# Patient Record
Sex: Male | Born: 1950 | Race: White | Hispanic: No | Marital: Married | Smoking: Never smoker
Health system: Southern US, Community
[De-identification: ages and names within clinical notes are randomized; demographics above are authoritative.]

## PROBLEM LIST (undated history)

## (undated) DIAGNOSIS — R059 Cough, unspecified: Secondary | ICD-10-CM

## (undated) DIAGNOSIS — I10 Essential (primary) hypertension: Secondary | ICD-10-CM

## (undated) DIAGNOSIS — C760 Malignant neoplasm of head, face and neck: Secondary | ICD-10-CM

## (undated) DIAGNOSIS — I4891 Unspecified atrial fibrillation: Secondary | ICD-10-CM

## (undated) DIAGNOSIS — R05 Cough: Secondary | ICD-10-CM

## (undated) DIAGNOSIS — C61 Malignant neoplasm of prostate: Secondary | ICD-10-CM

## (undated) DIAGNOSIS — H269 Unspecified cataract: Secondary | ICD-10-CM

## (undated) DIAGNOSIS — Q211 Atrial septal defect: Secondary | ICD-10-CM

## (undated) DIAGNOSIS — C4490 Unspecified malignant neoplasm of skin, unspecified: Secondary | ICD-10-CM

## (undated) DIAGNOSIS — H409 Unspecified glaucoma: Secondary | ICD-10-CM

## (undated) DIAGNOSIS — I2510 Atherosclerotic heart disease of native coronary artery without angina pectoris: Secondary | ICD-10-CM

## (undated) DIAGNOSIS — Q2112 Patent foramen ovale: Secondary | ICD-10-CM

## (undated) HISTORY — PX: OTHER SURGICAL HISTORY: SHX169

## (undated) HISTORY — PX: CATARACT EXTRACTION: SUR2

## (undated) HISTORY — DX: Unspecified glaucoma: H40.9

## (undated) HISTORY — PX: COLONOSCOPY: SHX174

## (undated) HISTORY — DX: Malignant neoplasm of prostate: C61

## (undated) HISTORY — PX: CHOLECYSTECTOMY: SHX55

## (undated) HISTORY — DX: Essential (primary) hypertension: I10

## (undated) HISTORY — DX: Unspecified cataract: H26.9

## (undated) HISTORY — PX: UPPER GASTROINTESTINAL ENDOSCOPY: SHX188

---

## 2002-01-26 ENCOUNTER — Ambulatory Visit (HOSPITAL_COMMUNITY): Admission: RE | Admit: 2002-01-26 | Discharge: 2002-01-26 | Payer: Self-pay | Admitting: Internal Medicine

## 2002-04-04 ENCOUNTER — Encounter: Payer: Self-pay | Admitting: Internal Medicine

## 2002-04-04 ENCOUNTER — Ambulatory Visit (HOSPITAL_COMMUNITY): Admission: RE | Admit: 2002-04-04 | Discharge: 2002-04-04 | Payer: Self-pay | Admitting: Internal Medicine

## 2002-05-19 ENCOUNTER — Encounter: Payer: Self-pay | Admitting: Surgery

## 2002-05-23 ENCOUNTER — Observation Stay (HOSPITAL_COMMUNITY): Admission: RE | Admit: 2002-05-23 | Discharge: 2002-05-24 | Payer: Self-pay | Admitting: Surgery

## 2002-05-23 ENCOUNTER — Encounter: Payer: Self-pay | Admitting: Surgery

## 2002-05-23 ENCOUNTER — Encounter (INDEPENDENT_AMBULATORY_CARE_PROVIDER_SITE_OTHER): Payer: Self-pay | Admitting: Specialist

## 2007-04-08 DIAGNOSIS — C61 Malignant neoplasm of prostate: Secondary | ICD-10-CM

## 2007-04-08 HISTORY — DX: Malignant neoplasm of prostate: C61

## 2007-04-08 HISTORY — PX: PROSTATE SURGERY: SHX751

## 2007-12-20 ENCOUNTER — Inpatient Hospital Stay (HOSPITAL_COMMUNITY): Admission: RE | Admit: 2007-12-20 | Discharge: 2007-12-21 | Payer: Self-pay | Admitting: Urology

## 2007-12-20 ENCOUNTER — Encounter (INDEPENDENT_AMBULATORY_CARE_PROVIDER_SITE_OTHER): Payer: Self-pay | Admitting: Urology

## 2010-02-07 ENCOUNTER — Telehealth: Payer: Self-pay | Admitting: Internal Medicine

## 2010-05-09 NOTE — Progress Notes (Signed)
Summary: triage eval  Phone Note Call from Patient Call back at [phone]   Caller: wife, Clydie Braun Call For: Dr. Juanda Chance Reason for Call: Talk to Nurse Summary of Call: pt had recent dentist visit and a lump was found on his uvula... dentist suggested pt see an ENT for eval... wife and pt would rather pt see Dr. Juanda Chance for this than an ENT... pt would need an NP3 with Dr. Juanda Chance- nothing available at this time... asked to speak with Dottie about this... Clydie Braun afraid of going to an ENT and then being referred to Dr. Juanda Chance anyway Initial call taken by: Vallarie Mare,  February 07, 2010 11:26 AM  Follow-up for Phone Call        I have advised patient's wife, Clydie Braun that the dentist is correct in sending patient to an ENT for a "lump on the uvula" as GI does not typically deal with the uvula. Patient's wife would still like me to confirm this with Dr Juanda Chance which I will do. Follow-up by: Lamona Curl CMA Duncan Dull),  February 07, 2010 11:45 AM  Additional Follow-up for Phone Call Additional follow up Details #1::        I agree with ENT eval. Additional Follow-up by: Hart Carwin MD,  February 07, 2010 12:39 PM     Appended Document: triage eval Left message on voicemail that Dr Juanda Chance does agree that patient needs ENT evaluation prior to any GI work up. Dr Juanda Chance will be glad to see the patient if the ENT feels that the area on the uvula has been caused by GERD etc.

## 2010-08-20 NOTE — Op Note (Signed)
Nicholas Daniels, Nicholas Daniels NO.:  [id number]   MEDICAL RECORD NO.:  [id number]          PATIENT TYPE:  INP   LOCATION:  1429                         FACILITY:  [facility]   PHYSICIAN:  Valetta Fuller, M.D.  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   PROCEDURE PERFORMED:  Robotic assisted laparoscopic radical retropubic  prostatectomy with bilateral pelvic lymph node dissection.   SURGEON:  Valetta Fuller, M.D.   ASSISTANT:  Heloise Purpura, MD   ANESTHESIA:  General endotracheal.   INDICATIONS:  [name] is a 60 year old male.  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 Marcine Matar.  The patient's PSA was minimally elevated at  approximately 5.1.  Digital rectal exam revealed no significant  abnormalities.  The patient's right-sided biopsies were all negative.  On the left he did have positive biopsies at the base, mid and apical  region of the prostate.  He had a mixture of Gleason 6 as well as 7  adenocarcinoma of the prostate.  Biopsies were generally of lower volume  disease.  No additional imaging studies were performed.  The patient  underwent extensive consultation with Dr. Retta Diones and subsequently  myself with regard to treatment options.  We felt that he was a good  candidate for robotic prostatectomy and felt that he would be a  candidate for bilateral nerve spare.  The patient appeared to understand  the advantages and disadvantages of the surgical approach and elected to  proceed with robotic prostatectomy for management of his condition.  The  patient received perioperative Unasyn and had placement of compression  boots.   He presents now for the procedure.   TECHNIQUE AND FINDINGS:  The patient was brought to the operating room  where he had successful induction of general endotracheal anesthesia.  He is placed in mid lithotomy position and prepped and draped in the  usual manner.  All extremities were carefully padded.  He was secured to  the operative table and then placed in steep Trendelenburg position  where he was then prepped and draped in the usual manner.  His Foley  catheter was placed sterilely on the field.  Initial camera port  incision was chosen 18 cm above the pubic symphysis just to the left of  the umbilicus.  A standard open Hasson technique was utilized and no  adhesions were palpated.  The 12 mm trocar was placed without difficulty  and the abdomen insufflated without incident.  All other trocars were  placed with direct visual guidance.  This included three 8-mm robotic  assist ports.  A 12-mm and 5-mm assist port were also placed.  After  placement of all the ports, the robotic cart was docked.  The bladder  was then filled to help identify it and then the space  of Retzius was  dissected with hot electrocautery scissors.  Superficial fat and  superficial dorsal venous complex were taken care of with electrocautery  scissors.  The endopelvic fascia was then incised from base to the apex  of the prostate.  Levator musculature was swept off the apex exposing  the groove for the dorsal venous complex.  The dorsal vein was then  stapled.  Hemostasis was excellent.  Anterior bladder neck was then  identified with the aid of the Foley catheter balloon.  This was  transected until the Foley catheter was found in the midline which was  then retracted anteriorly.  There did not appear to be a middle lobe  component and the posterior bladder neck was then transected.  Indigo  carmine was given and we appeared to be well away from the ureteral  orifices.  The seminal vesicles and vas deferens  were found without  incident and dissected free individually.  Clips were used to the tips  of the seminal vesicles and a minimum of cautery was utilized.  The  posterior plane between the rectum and prostate was then established  with blunt dissecting technique.   Attention was then turned towards bilateral nerve spare.  Superficial  fascia off the prostate was incised bilaterally sweeping off the  neurovascular bundles from their posterolateral position.  This was  carried out the apex of the prostate taking them off the lateral  urethra.  The prostate was then lifted identifying the pedicles which  were taken down with Hematoclips.  Additional small flimsy attachments  to the neurovascular bundles were then just gently swept off.  The  anterior urethra was then transected and the prostatic specimen was  removed.  A nice urethral stump was left.  The prostate was removed from  the pelvis, which was then copiously irrigated.  There was no evidence  of rectal injury.   Attention was then turned towards bilateral pelvic lymph node  dissection.  We felt that given his favorable status, a more extended  lymph node dissection was not necessary.  Obturator packets were taken  bilaterally extending this up just to the bifurcation of the common  iliac artery.  Obturator nerves were identified bilaterally and  preserved.  Hematoclips were used for small lymphatic channels and small  venous tributaries.  Both lymph node packets were sent for permanent  analysis only.   Attention was then turned towards reconstruction.  Additional amp of  indigo carmine was given.  The posterior bladder neck and posterior  urethra were reapproximated with interrupted 2-0 Vicryl suture.  Once  these structures were together without tension, a double-armed 3-0  Monocryl suture was used to perform a running 360 degrees anastomosis.  A new coude catheter was placed and irrigation revealed no obvious  leakage.   Urine drained blue color with just light pink tinge.  The  pelvic drain was placed through one of the ports and placed in the  retroperitoneal space overlying the anastomosis.  The prostate was  placed an Endopouch retrieval bag.  The 12 mm assist port was closed  with a 0 Vicryl with the aid of a suture passer.  All other trocars were  removed with visual guidance.  The camera port incision was extended  slightly to allow for removal of the specimen.  This incision was then  closed with a running #1 Vicryl suture.  All other port sites were  infiltrated with Marcaine and then closed with clips.  The drain  was  secured to the abdominal wall.  The patient appeared to tolerate the  procedure well.  Blood loss was estimated 200 mL.  No obvious  complications occurred.  The patient was brought to recovery room in  stable condition.      Valetta Fuller, M.D.  Electronically Signed     DSG/MEDQ  D:  12/20/2007  T:  12/21/2007  Job:  [id number]

## 2010-08-23 NOTE — Op Note (Signed)
NAMEOVA, MEEGAN NO.:  [id number]   MEDICAL RECORD NO.:  [id number]                   PATIENT TYPE:  AMB   LOCATION:  DAY                                  FACILITY:  [facility]   PHYSICIAN:  Thornton Park. Daphine Deutscher, M.D.             DATE OF BIRTH:  [birth year]   DATE OF PROCEDURE:  05/23/2002  DATE OF DISCHARGE:                                 OPERATIVE REPORT   OFFICE MEDICAL RECORD NUMBER:  [id number].   PREOPERATIVE DIAGNOSES:  Cholelithiasis, chronic cholecystitis.   POSTOPERATIVE DIAGNOSES:  Cholelithiasis, chronic cholecystitis.   PROCEDURE:  Laparoscopic cholecystectomy with intraoperative cholangiogram.   SURGEON:  Thornton Park. Daphine Deutscher, M.D.   ASSISTANT:  Sharlet Salina T. Hoxworth, M.D.   DESCRIPTION OF PROCEDURE:  Devanta Daniel was taken to room 11 and given  general anesthesia.  The abdomen was prepped with Betadine and draped  sterilely.  A longitudinal incision was made down to the umbilicus and  through a tiny umbilical hernia, I was able to enlarge that and enter the  abdomen with Hasson cannula and insufflate.  Following insufflation, three  ports were placed in the upper abdomen.  The gallbladder was grasped,  elevated, and he had numerous adhesions stuck to his infundibulum which were  carefully stripped away.  I exposed the infundibulum and retracted that  laterally, incised the peritoneum overlying Calot's triangle and dissected  free the cystic duct and cystic artery.  I triple clipped the cystic artery  and put a clip up on the gallbladder.  I incised the cystic duct and found a  small duct.  After finally modifying a Reddick catheter which I had tried on  numerous occasions to insert with difficulty, I cut off the tip and was able  to get this small catheter in and clipped it in place.  A dynamic  cholangiogram showed filling of the common duct and flow into the duodenum  with intrahepatic filling.  There was an air bubble that we noticed  going  into the duodenum but no stones, and the duct was not really dilated.  I  then removed that and triple clipped the cystic duct and divided it and  divided the already triple clipped cystic artery.  The gallbladder was  removed from the gallbladder bed using the hook electrocautery without  entering it.  Once detached, it was brought out through the umbilicus.  The  gallbladder bed was in the meantime inspected, and no bleeding or bile leaks  were noted.  The umbilical port was then repaired with two sutures of 0  Vicryl under laparoscopic vision without problems.  I then surveyed the rest  of the abdomen and watched the other ports come out.  The abdomen was  deflated.  The skin wounds were infiltrated with 1% lidocaine with Marcaine  and were closed with 4-0 Vicryl with Benzoin and Steri-Strips.  The patient  seemed to tolerate  the procedure well and was taken to the recovery room in  satisfactory condition.    FINAL DIAGNOSES:  1. Chronic cholecystitis.  2. Cholelithiasis.  3. Normal intraoperative cholangiogram.                                               Thornton Park. Daphine Deutscher, M.D.    MBM/MEDQ  D:  05/23/2002  T:  05/23/2002  Job:  [id number]   cc:   Lina Sar, M.D. [facility]

## 2010-08-29 ENCOUNTER — Ambulatory Visit
Admission: RE | Admit: 2010-08-29 | Discharge: 2010-08-29 | Disposition: A | Discharge status: Home / Self Care | Payer: Medicare HMO | Source: Ambulatory Visit | Attending: Radiation Oncology | Admitting: Radiation Oncology

## 2010-08-29 DIAGNOSIS — C61 Malignant neoplasm of prostate: Secondary | ICD-10-CM | POA: Insufficient documentation

## 2010-08-29 DIAGNOSIS — Z510 Encounter for antineoplastic radiation therapy: Secondary | ICD-10-CM | POA: Insufficient documentation

## 2010-08-29 DIAGNOSIS — Z79899 Other long term (current) drug therapy: Secondary | ICD-10-CM | POA: Insufficient documentation

## 2010-08-29 DIAGNOSIS — R3915 Urgency of urination: Secondary | ICD-10-CM | POA: Insufficient documentation

## 2010-08-29 DIAGNOSIS — Y842 Radiological procedure and radiotherapy as the cause of abnormal reaction of the patient, or of later complication, without mention of misadventure at the time of the procedure: Secondary | ICD-10-CM | POA: Insufficient documentation

## 2010-08-29 DIAGNOSIS — R350 Frequency of micturition: Secondary | ICD-10-CM | POA: Insufficient documentation

## 2010-08-29 DIAGNOSIS — Z7982 Long term (current) use of aspirin: Secondary | ICD-10-CM | POA: Insufficient documentation

## 2010-11-26 ENCOUNTER — Ambulatory Visit
Admission: RE | Admit: 2010-11-26 | Discharge: 2010-11-26 | Disposition: A | Discharge status: Home / Self Care | Payer: Medicare HMO | Source: Ambulatory Visit | Attending: Radiation Oncology | Admitting: Radiation Oncology

## 2011-01-06 LAB — HEMOGLOBIN AND HEMATOCRIT, BLOOD
HCT: 37 — ABNORMAL LOW
Hemoglobin: 12.5 — ABNORMAL LOW

## 2011-01-08 ENCOUNTER — Other Ambulatory Visit: Payer: Self-pay | Admitting: Dermatology

## 2011-01-08 LAB — BASIC METABOLIC PANEL
BUN: 9
CO2: 30
Calcium: 9.2
Chloride: 105
Creatinine, Ser: 0.94
GFR calc Af Amer: >60
GFR calc non Af Amer: >60
Glucose, Bld: 100 — ABNORMAL HIGH
Potassium: 3.7
Sodium: 144

## 2011-01-08 LAB — ABO/RH: ABO/RH(D): A POS

## 2011-01-08 LAB — HEMOGLOBIN AND HEMATOCRIT, BLOOD
HCT: 43.6
HCT: 45.4
Hemoglobin: 14.5

## 2011-04-04 ENCOUNTER — Other Ambulatory Visit: Payer: Self-pay | Admitting: Dermatology

## 2012-12-03 ENCOUNTER — Encounter: Payer: Self-pay | Admitting: Internal Medicine

## 2013-01-13 ENCOUNTER — Encounter: Payer: Self-pay | Admitting: Internal Medicine

## 2013-02-15 ENCOUNTER — Ambulatory Visit (AMBULATORY_SURGERY_CENTER): Payer: Self-pay

## 2013-02-15 VITALS — Ht 67.0 in | Wt 193.4 lb

## 2013-02-15 DIAGNOSIS — Z1211 Encounter for screening for malignant neoplasm of colon: Secondary | ICD-10-CM

## 2013-02-15 MED ORDER — MOVIPREP 100 G PO SOLR: ORAL | Status: DC

## 2013-03-02 ENCOUNTER — Ambulatory Visit (AMBULATORY_SURGERY_CENTER): Payer: Managed Care, Other (non HMO) | Admitting: Internal Medicine

## 2013-03-02 ENCOUNTER — Encounter: Payer: Self-pay | Admitting: Internal Medicine

## 2013-03-02 VITALS — BP 124/84 | HR 72 | Temp 96.9°F | Resp 8 | Ht 67.0 in | Wt 193.0 lb

## 2013-03-02 DIAGNOSIS — Z1211 Encounter for screening for malignant neoplasm of colon: Secondary | ICD-10-CM

## 2013-03-02 MED ORDER — SODIUM CHLORIDE 0.9 % IV SOLN: 500.0000 mL | INTRAVENOUS | Status: DC

## 2013-03-02 NOTE — Op Note (Signed)
[facility] [address] [place], [address]   COLONOSCOPY PROCEDURE REPORT  PATIENT: Nicholas Daniels, Nicholas Daniels  MR#: [id number] BIRTHDATE: 01/21/1951 , 62  yrs. old GENDER: Male ENDOSCOPIST: Hart Carwin, MD REFERRED XB:JYNWGN Eloise Harman, M.D. PROCEDURE DATE:  03/02/2013 PROCEDURE:   Colonoscopy, screening First Screening Colonoscopy - Avg.  risk and is 50 yrs.  old or older - No.  Prior Negative Screening - Now for repeat screening. 10 or more years since last screening  History of Adenoma - Now for follow-up colonoscopy & has been > or = to 3 yrs.  N/A  Polyps Removed Today? No.  Recommend repeat exam, <10 yrs? No. ASA CLASS:   Class II INDICATIONS:Average risk patient for colon cancer and llast colonoscopy in 2003 was normal. MEDICATIONS: MAC sedation, administered by CRNA and propofol (Diprivan) 200mg  IV  DESCRIPTION OF PROCEDURE:   After the risks benefits and alternatives of the procedure were thoroughly explained, informed consent was obtained.  A digital rectal exam revealed no abnormalities of the rectum.   The LB PFC-H190 [id number]  endoscope was introduced through the anus and advanced to the cecum, which was identified by both the appendix and ileocecal valve. No adverse events experienced.   The quality of the prep was excellent.  The instrument was then slowly withdrawn as the colon was fully examined.      COLON FINDINGS: A normal appearing cecum, ileocecal valve, and appendiceal orifice were identified.  The ascending, hepatic flexure, transverse, splenic flexure, descending, sigmoid colon and rectum appeared unremarkable.  No polyps or cancers were seen. Retroflexed views revealed no abnormalities. The time to cecum=3 minutes 36 seconds.  Withdrawal time=7 minutes 16 seconds.  The scope was withdrawn and the procedure completed. COMPLICATIONS: There were no complications.  ENDOSCOPIC IMPRESSION: Normal colon  RECOMMENDATIONS: 1.  High fiber  diet 2.   recall colonoscopy in 10 years   eSigned:  Hart Carwin, MD 03/02/2013 9:36 AM   cc:

## 2013-03-02 NOTE — Patient Instructions (Addendum)
YOU HAD AN ENDOSCOPIC PROCEDURE TODAY AT THE [facility]: Refer to the procedure report that was given to you for any specific questions about what was found during the examination.  If the procedure report does not answer your questions, please call your gastroenterologist to clarify.  If you requested that your care partner not be given the details of your procedure findings, then the procedure report has been included in a sealed envelope for you to review at your convenience later.  YOU SHOULD EXPECT: Some feelings of bloating in the abdomen. Passage of more gas than usual.  Walking can help get rid of the air that was put into your GI tract during the procedure and reduce the bloating. If you had a lower endoscopy (such as a colonoscopy or flexible sigmoidoscopy) you may notice spotting of blood in your stool or on the toilet paper. If you underwent a bowel prep for your procedure, then you may not have a normal bowel movement for a few days.  DIET: Your first meal following the procedure should be a light meal and then it is ok to progress to your normal diet.  A half-sandwich or bowl of soup is an example of a good first meal.  Heavy or fried foods are harder to digest and may make you feel nauseous or bloated.  Likewise meals heavy in dairy and vegetables can cause extra gas to form and this can also increase the bloating.  Drink plenty of fluids but you should avoid alcoholic beverages for 24 hours.  ACTIVITY: Your care partner should take you home directly after the procedure.  You should plan to take it easy, moving slowly for the rest of the day.  You can resume normal activity the day after the procedure however you should NOT DRIVE or use heavy machinery for 24 hours (because of the sedation medicines used during the test).    SYMPTOMS TO REPORT IMMEDIATELY: A gastroenterologist can be reached at any hour.  During normal business hours, 8:30 AM to 5:00 PM Monday through Friday,  call [phone].  After hours and on weekends, please call the GI answering service at [phone] who will take a message and have the physician on call contact you.   Following lower endoscopy (colonoscopy or flexible sigmoidoscopy):  Excessive amounts of blood in the stool  Significant tenderness or worsening of abdominal pains  Swelling of the abdomen that is new, acute  Fever of 100F or higher    FOLLOW UP: If any biopsies were taken you will be contacted by phone or by letter within the next 1-3 weeks.  Call your gastroenterologist if you have not heard about the biopsies in 3 weeks.  Our staff will call the home number listed on your records the next business day following your procedure to check on you and address any questions or concerns that you may have at that time regarding the information given to you following your procedure. This is a courtesy call and so if there is no answer at the home number and we have not heard from you through the emergency physician on call, we will assume that you have returned to your regular daily activities without incident.  SIGNATURES/CONFIDENTIALITY: You and/or your care partner have signed paperwork which will be entered into your electronic medical record.  These signatures attest to the fact that that the information above on your After Visit Summary has been reviewed and is understood.  Full responsibility of the confidentiality   of this discharge information lies with you and/or your care-partner.  Normal colonoscopy, next colonoscopy 10 years.  High fiber diet information given.

## 2013-03-02 NOTE — Progress Notes (Signed)
Report to pacu rn, vss, bbs=clear 

## 2013-03-02 NOTE — Progress Notes (Signed)
Patient did not experience any of the following events: a burn prior to discharge; a fall within the facility; wrong site/side/patient/procedure/implant event; or a hospital transfer or hospital admission upon discharge from the facility. (G8907) Patient did not have preoperative order for IV antibiotic SSI prophylaxis. (G8918)  

## 2013-03-07 ENCOUNTER — Telehealth: Payer: Self-pay | Admitting: *Deleted

## 2013-03-07 NOTE — Telephone Encounter (Signed)
  Follow up Call-  Call back number 03/02/2013  Post procedure Call Back phone  # [phone]  Permission to leave phone message Yes     Patient questions:  Do you have a fever, pain , or abdominal swelling? no Pain Score  0 *  Have you tolerated food without any problems? yes  Have you been able to return to your normal activities? yes  Do you have any questions about your discharge instructions: Diet   no Medications  no Follow up visit  no  Do you have questions or concerns about your Care? no  Actions: * If pain score is 4 or above: No action needed, pain <4.

## 2013-08-24 ENCOUNTER — Other Ambulatory Visit: Payer: Self-pay | Admitting: Dermatology

## 2014-02-08 ENCOUNTER — Other Ambulatory Visit: Payer: Self-pay | Admitting: Dermatology

## 2014-06-26 ENCOUNTER — Other Ambulatory Visit (HOSPITAL_COMMUNITY): Payer: Self-pay | Admitting: Urology

## 2014-06-26 DIAGNOSIS — C61 Malignant neoplasm of prostate: Secondary | ICD-10-CM

## 2014-07-17 ENCOUNTER — Encounter (HOSPITAL_COMMUNITY)
Admission: RE | Admit: 2014-07-17 | Discharge: 2014-07-17 | Disposition: A | Discharge status: Home / Self Care | Payer: Managed Care, Other (non HMO) | Source: Ambulatory Visit | Attending: Urology | Admitting: Urology

## 2014-07-17 DIAGNOSIS — C61 Malignant neoplasm of prostate: Secondary | ICD-10-CM

## 2014-07-17 MED ORDER — TECHNETIUM TC 99M MEDRONATE IV KIT
26.3000 | PACK | Freq: Once | INTRAVENOUS | Status: AC | PRN
  Administered 2014-07-17: 26.3 via INTRAVENOUS

## 2014-08-02 ENCOUNTER — Encounter: Payer: Managed Care, Other (non HMO) | Admitting: Cardiothoracic Surgery

## 2014-08-09 ENCOUNTER — Encounter: Payer: Self-pay | Admitting: Cardiothoracic Surgery

## 2014-08-09 ENCOUNTER — Institutional Professional Consult (permissible substitution) (INDEPENDENT_AMBULATORY_CARE_PROVIDER_SITE_OTHER): Payer: Managed Care, Other (non HMO) | Admitting: Cardiothoracic Surgery

## 2014-08-09 VITALS — BP 146/89 | HR 64 | Resp 16 | Ht 67.0 in | Wt 194.0 lb

## 2014-08-09 DIAGNOSIS — J9859 Other diseases of mediastinum, not elsewhere classified: Secondary | ICD-10-CM | POA: Insufficient documentation

## 2014-08-09 DIAGNOSIS — I10 Essential (primary) hypertension: Secondary | ICD-10-CM | POA: Diagnosis not present

## 2014-08-09 DIAGNOSIS — R222 Localized swelling, mass and lump, trunk: Secondary | ICD-10-CM | POA: Diagnosis not present

## 2014-08-09 DIAGNOSIS — Z8546 Personal history of malignant neoplasm of prostate: Secondary | ICD-10-CM | POA: Insufficient documentation

## 2014-08-09 NOTE — Progress Notes (Signed)
PCP is [name], MD Referring Provider is Ardis Hughs, MD  Chief Complaint  Patient presents with  . Mediastinal Mass    per CT CHEST/ABD/PELVIS  @ [facility] 07/18/14, BONE SCAN 07/17/14 @ [facility]   Patient examined, CT scan of chest personally reviewed  ZDG:UYQI nice 64 year old Caucasian male nonsmoker nondiabetic with history of adenocarcinoma of the prostate resected by radical robotic prostatectomy in 2009. He has had recurrent rising PSA. Bone scan was negative for skeletal metastatic disease. 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 This is in close proximity to the aortic root and right coronary artery. The differential by radiologist is a calcified thymoma versus a calcified thrombosed right coronary artery aneurysm. The patient has a strong family history of coronary disease both his brother and mother have had MI -his brothers with fatal. He denies angina. He denies weight loss. He denies cough or anterior chest wall pain. He does admit to positive night sweats since the prostatectomy. The patient's oncologist is concerned regarding the anterior mediastinal mass which is newly diagnosed and will affect the oncologic therapy of his prostate cancer going forward. The mass is too close to the aorta for a biopsy and will need to be resected. Because of the proximity to the aortic root and possibility of a right coronary artery aneurysm a cardiac CT scan will be performed before discussing surgical options.   Past Medical History  Diagnosis Date  . Hypertension   . Glaucoma   . Prostate cancer 2009    Past Surgical History  Procedure Laterality Date  . Prostate surgery  2009  . Cholecystectomy    . Radiation treatment      Family History  Problem Relation Age of Onset  . Heart disease Brother   . Liver cancer Brother     Social History History  Substance Use Topics   . Smoking status: Never Smoker   . Smokeless tobacco: Never Used  . Alcohol Use: No    Current Outpatient Prescriptions  Medication Sig Dispense Refill  . aspirin 325 MG tablet Take 325 mg by mouth daily.    . betamethasone dipropionate (DIPROLENE) 0.05 % ointment Apply topically daily. APPLY SPARINGLY TO AFFECTED AREAS ONCE DAILY    . diltiazem (CARDIZEM CD) 120 MG 24 hr capsule Take 120 mg by mouth daily.    . Flaxseed, Linseed, (FLAXSEED OIL PO) Take 1,200 mg by mouth. Take 2 pills daily    . latanoprost (XALATAN) 0.005 % ophthalmic solution Place 1 drop into both eyes at bedtime.    . Multiple Vitamin (MULTIVITAMIN) tablet Take 1 tablet by mouth daily.    . niacin (SLO-NIACIN) 500 MG tablet Take 500 mg by mouth at bedtime.    Marland Kitchen PRESCRIPTION MEDICATION PGE-Inject 20 mcg prn    . sildenafil (REVATIO) 20 MG tablet Take 20 mg by mouth as needed.    . timolol (TIMOPTIC) 0.5 % ophthalmic solution Place 1 drop into both eyes daily.     No current facility-administered medications for this visit.    No Known Allergies  Review of Systems    Review of Systems  General:  No weight loss   no fever   no decreased energy  no night sweats Cardiac: - Chest pain with exertion - resting chest pain  -SOB with exertion  - Orthopnea                 -  PND - ankle edema - syncope Pulmonary:  no dyspnea,no cough, no productive cough no home oxygen no hemoptysis GI: no difficulty swallowing  no GERD no jaundice  no melena  no hematemesis no        abdominal pain GU:  no dysuria  no hematuria  no frequent UTI no BPH  + erectile  dysfunction following prostatectomy Vascular:  no claudication  No TIA  No varicose veins no DVT Neuro:  no sroke no seizures no TIA no head trauma no vision changes Musculoskeletal:  normal mobility no arthritis  no gout  no joint swelling Skin: no rash  no skin ulceration  no skin cancer Endocrine:  _diabetes  no thyroid didease Hematologic: no easy bruising  no blood  transfusions  no frequent epistaxis ENT : no painful teeth no dentures no loose teeth Psych : no anxiety  no depression  o psych hospitalizations     The patient tolerated his radical prostatectomy without anesthetic or bleeding complication.   BP 146/89 mmHg  Pulse 64  Resp 16  Ht 5\' 7"  (1.702 m)  Wt 194 lb (87.998 kg)  BMI 30.38 kg/m2  SpO2 97% Physical Exam     Physical Exam  General: obese Caucasian male no acute distress HEENT: Normocephalic pupils equal , dentition adequate Neck: Supple without JVD, adenopathy, or bruit Chest: Clear to auscultation, symmetrical breath sounds, no rhonchi, no tenderness             or deformity Cardiovascular: Regular rate and rhythm, no murmur, no gallop, peripheral pulses             palpable in all extremities Abdomen:  Soft, nontender, no palpable mass or organomegaly Extremities: Warm, well-perfused, no clubbing cyanosis edema or tenderness,              no venous stasis changes of the legs Rectal/GU: Deferred Neuro: Grossly non--focal and symmetrical throughout Skin: Clean and dry without rash or ulceration   Diagnostic Tests:  CT scan reviewed demonstrating a calcified around anterior mediastinal density close proximity to aortic root  Impression: 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 Resection is recommended as the best therapy. Prior to surgery he'll need cardiac CT scan to confirm that the mass is not related to the coronary circulation. If so then he will need full cardiac catheterization prior to any mediastinal surgery.  Plan:  return to review results of cardiac CT scan of possible right coronary thrombosed coronary aneurysm   Len Childs, MD [facility] [phone]

## 2014-08-10 ENCOUNTER — Other Ambulatory Visit: Payer: Self-pay

## 2014-08-10 DIAGNOSIS — I2541 Coronary artery aneurysm: Secondary | ICD-10-CM

## 2014-08-14 ENCOUNTER — Other Ambulatory Visit: Payer: Self-pay

## 2014-08-14 DIAGNOSIS — R222 Localized swelling, mass and lump, trunk: Secondary | ICD-10-CM

## 2014-08-17 LAB — CREATININE, SERUM: Creat: 0.87 mg/dL (ref 0.50–1.35)

## 2014-08-17 LAB — BUN: BUN: 14 mg/dL (ref 6–23)

## 2014-08-18 ENCOUNTER — Ambulatory Visit (HOSPITAL_COMMUNITY): Admission: RE | Admit: 2014-08-18 | Payer: Managed Care, Other (non HMO) | Source: Ambulatory Visit

## 2014-08-19 ENCOUNTER — Ambulatory Visit (HOSPITAL_COMMUNITY): Payer: Managed Care, Other (non HMO)

## 2014-09-05 ENCOUNTER — Ambulatory Visit (HOSPITAL_COMMUNITY)
Admission: RE | Admit: 2014-09-05 | Discharge: 2014-09-05 | Disposition: A | Discharge status: Home / Self Care | Payer: Managed Care, Other (non HMO) | Source: Ambulatory Visit | Attending: Cardiothoracic Surgery | Admitting: Cardiothoracic Surgery

## 2014-09-05 ENCOUNTER — Encounter (HOSPITAL_COMMUNITY): Payer: Self-pay

## 2014-09-05 DIAGNOSIS — Q211 Atrial septal defect: Secondary | ICD-10-CM | POA: Insufficient documentation

## 2014-09-05 DIAGNOSIS — I2510 Atherosclerotic heart disease of native coronary artery without angina pectoris: Secondary | ICD-10-CM | POA: Diagnosis not present

## 2014-09-05 DIAGNOSIS — C61 Malignant neoplasm of prostate: Secondary | ICD-10-CM | POA: Diagnosis not present

## 2014-09-05 DIAGNOSIS — R938 Abnormal findings on diagnostic imaging of other specified body structures: Secondary | ICD-10-CM | POA: Insufficient documentation

## 2014-09-05 DIAGNOSIS — I2541 Coronary artery aneurysm: Secondary | ICD-10-CM | POA: Diagnosis not present

## 2014-09-05 MED ORDER — IOHEXOL 350 MG/ML SOLN
80.0000 mL | Freq: Once | INTRAVENOUS | Status: AC | PRN
  Administered 2014-09-05: 100 mL via INTRAVENOUS

## 2014-09-05 MED ORDER — METOPROLOL TARTRATE 1 MG/ML IV SOLN
INTRAVENOUS | Status: AC
  Administered 2014-09-05: 09:00:00
  Filled 2014-09-05: qty 5

## 2014-09-05 MED ORDER — NITROGLYCERIN 0.4 MG SL SUBL
SUBLINGUAL_TABLET | SUBLINGUAL | Status: AC
  Administered 2014-09-05: 09:00:00
  Filled 2014-09-05: qty 1

## 2014-09-06 ENCOUNTER — Ambulatory Visit (HOSPITAL_COMMUNITY): Payer: Managed Care, Other (non HMO)

## 2014-10-02 ENCOUNTER — Ambulatory Visit (INDEPENDENT_AMBULATORY_CARE_PROVIDER_SITE_OTHER): Payer: Managed Care, Other (non HMO) | Admitting: Cardiothoracic Surgery

## 2014-10-02 ENCOUNTER — Encounter: Payer: Managed Care, Other (non HMO) | Admitting: Cardiothoracic Surgery

## 2014-10-02 ENCOUNTER — Encounter: Payer: Self-pay | Admitting: Cardiothoracic Surgery

## 2014-10-02 VITALS — BP 153/90 | HR 70 | Resp 20 | Ht 67.0 in | Wt 194.0 lb

## 2014-10-02 DIAGNOSIS — Z8546 Personal history of malignant neoplasm of prostate: Secondary | ICD-10-CM | POA: Diagnosis not present

## 2014-10-02 DIAGNOSIS — R222 Localized swelling, mass and lump, trunk: Secondary | ICD-10-CM

## 2014-10-02 DIAGNOSIS — J9859 Other diseases of mediastinum, not elsewhere classified: Secondary | ICD-10-CM

## 2014-10-02 NOTE — Progress Notes (Signed)
PCP is Nicholas Lopes, MD Referring Provider is Nicholas Hughs, MD  Chief Complaint  Patient presents with  . Mediastinal Mass    f/u to review CTA cardiac results    HPI:the patient returns for further evaluation and discussion of a recently diagnosed 2.5 cm mediastinal mass close the pericardium. It was a rounded density with some calcification. The CT scan questioned whether this could be a thrombosed right coronary aneurysm. The patient underwent cardiac CT scan which showed the density to BX sternal to the pericardium, possibly attached, and there is low level coronary artery disease-calcification.the patient has not had a echocardiogram.  The patient has apparently advanced stage prostate cancer after robotic prostatectomy with PSA 3.5 on no hormonal therapy. He tolerated the prostatectomy under general anesthesia without any cardiac problems.  Patient is a nonsmoker. The patient is not losing weight. He has some symptoms of upper respiratory infection-probable viral.  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 Past Medical History  Diagnosis Date  . Hypertension   . Glaucoma   . Prostate cancer 2009    Past Surgical History  Procedure Laterality Date  . Prostate surgery  2009  . Cholecystectomy    . Radiation treatment      Family History  Problem Relation Age of Onset  . Heart disease Brother   . Liver cancer Brother     Social History History  Substance Use Topics  . Smoking status: Never Smoker   . Smokeless tobacco: Never Used  . Alcohol Use: No    Current Outpatient Prescriptions  Medication Sig Dispense Refill  . aspirin 325 MG tablet Take 325 mg by mouth daily.    . betamethasone dipropionate (DIPROLENE) 0.05 % ointment Apply topically daily. APPLY SPARINGLY TO AFFECTED AREAS ONCE DAILY    . diltiazem (CARDIZEM CD) 120 MG  24 hr capsule Take 120 mg by mouth daily.    . Flaxseed, Linseed, (FLAXSEED OIL PO) Take 1,200 mg by mouth. Take 2 pills daily    . latanoprost (XALATAN) 0.005 % ophthalmic solution Place 1 drop into both eyes at bedtime.    . Multiple Vitamin (MULTIVITAMIN) tablet Take 1 tablet by mouth daily.    . niacin (SLO-NIACIN) 500 MG tablet Take 500 mg by mouth at bedtime.    Marland Kitchen PRESCRIPTION MEDICATION PGE-Inject 20 mcg prn    . sildenafil (REVATIO) 20 MG tablet Take 20 mg by mouth as needed.    . timolol (TIMOPTIC) 0.5 % ophthalmic solution Place 1 drop into both eyes daily.     No current facility-administered medications for this visit.    No Known Allergies  Review of Systems   Review of Systems   the anterior mediastinal mass is asymptomatic  General:  No weight loss   no fever   no decreased energy  no night sweats Cardiac: - Chest pain with exertion - resting chest pain   -SOB with exertion  - Orthopnea                 - PND  ankle edema - syncope Pulmonary:  no dyspnea,no cough, no productive cough no home oxygen no hemoptysis GI: no difficulty swallowing  no GERD no jaundice  no melena  no hematemesis no        abdominal pain GU:  no dysuria  no hematuria  no frequent UTI status post robotic prostatectomy for prostate  cancer with postop elevated PSA Vascular:  no claudication  No TIA  No varicose veins no DVT Neuro:  no sroke no seizures no TIA no head trauma no vision changes Musculoskeletal:  normal mobility no arthritis  no gout  no joint swelling Skin: no rash  no skin ulceration  no skin cancer Endocrine: diabetes  no thyroid didease Hematologic: no easy bruising  no blood transfusions  no frequent epistaxis ENT : no painful teeth no dentures no loose teeth Psych : no anxiety  no depression  o psych hospitalizations        BP 153/90 mmHg  Pulse 70  Resp 20  Ht 5\' 7"  (1.702 m)  Wt 194 lb (87.998 kg)  BMI 30.38 kg/m2  SpO2 98% Physical Exam Alert and comfortable HEENT  normocephalic neck supple mouth clear pupils equal Lymphatics-no palpable nodes in the neck or supralevator fossa Chest-breath sounds clear and equal Cardiac-regular rhythm without murmur or gallop or rub Abdomen-soft obese nontender Extremities-no tenderness edema or cyanosis Vascular-palpable pulses in all extremities Neurologic-no focal motor deficit  Diagnostic Tests: Cardiac CT scan results reviewed with patient and images of the scan were independently reviewed by myself. The patient has a round partially calcified mediastinal mass which is resected.  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 I believe this is acceptable and that the surgery is not urgent. He return to see me in office just before his 64th birthday to discuss the time of operation.  Len Childs, MD [facility] [phone]

## 2015-02-08 ENCOUNTER — Other Ambulatory Visit: Payer: Self-pay | Admitting: *Deleted

## 2015-02-08 DIAGNOSIS — J9859 Other diseases of mediastinum, not elsewhere classified: Secondary | ICD-10-CM

## 2015-02-13 LAB — CREATININE, SERUM: Creat: 1.1 mg/dL (ref 0.70–1.25)

## 2015-02-14 ENCOUNTER — Encounter: Payer: Self-pay | Admitting: Cardiothoracic Surgery

## 2015-02-14 ENCOUNTER — Ambulatory Visit (INDEPENDENT_AMBULATORY_CARE_PROVIDER_SITE_OTHER): Payer: Self-pay | Admitting: Cardiothoracic Surgery

## 2015-02-14 ENCOUNTER — Ambulatory Visit
Admission: RE | Admit: 2015-02-14 | Discharge: 2015-02-14 | Disposition: A | Discharge status: Home / Self Care | Payer: PPO | Source: Ambulatory Visit | Attending: Cardiothoracic Surgery | Admitting: Cardiothoracic Surgery

## 2015-02-14 ENCOUNTER — Ambulatory Visit (INDEPENDENT_AMBULATORY_CARE_PROVIDER_SITE_OTHER): Payer: PPO | Admitting: Cardiothoracic Surgery

## 2015-02-14 VITALS — BP 150/86 | HR 74 | Resp 20 | Ht 67.0 in | Wt 194.0 lb

## 2015-02-14 DIAGNOSIS — J9859 Other diseases of mediastinum, not elsewhere classified: Secondary | ICD-10-CM

## 2015-02-14 DIAGNOSIS — Z8546 Personal history of malignant neoplasm of prostate: Secondary | ICD-10-CM | POA: Diagnosis not present

## 2015-02-14 MED ORDER — IOPAMIDOL (ISOVUE-300) INJECTION 61%: 75.0000 mL | Freq: Once | INTRAVENOUS | Status: DC | PRN

## 2015-02-14 NOTE — Progress Notes (Signed)
PCP is [name], MD Referring Provider is Ardis Hughs, MD  Chief Complaint  Patient presents with  . Follow-up    5 month f/u with Chest CT    HPI: Patient returns for further discussion of a 2.5 cm partially calcified anterior mediastinal mass. 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 There is been no change in the tumor by scans over the past 6 months. He is asymptomatic. He was an incidental finding when the patient underwent total body scans for history of advanced stage prostate cancer.patient is a nonsmoker.  Patient has a strong family history coronary disease with high incidence of myocardial infarction and most members of his family.  No history thoracic trauma.  Past Medical History  Diagnosis Date  . Hypertension   . Glaucoma   . Prostate cancer [facility]) 2009    Past Surgical History  Procedure Laterality Date  . Prostate surgery  2009  . Cholecystectomy    . Radiation treatment      Family History  Problem Relation Age of Onset  . Heart disease Brother   . Liver cancer Brother     Social History Social History  Substance Use Topics  . Smoking status: Never Smoker   . Smokeless tobacco: Never Used  . Alcohol Use: No    Current Outpatient Prescriptions  Medication Sig Dispense Refill  . aspirin 325 MG tablet Take 325 mg by mouth daily.    Marland Kitchen diltiazem (CARDIZEM CD) 120 MG 24 hr capsule Take 120 mg by mouth daily.    . Flaxseed, Linseed, (FLAXSEED OIL PO) Take 1,200 mg by mouth. Take 2 pills daily    . latanoprost (XALATAN) 0.005 % ophthalmic solution Place 1 drop into both eyes at bedtime.    . Multiple Vitamin (MULTIVITAMIN) tablet Take 1 tablet by mouth daily.    . niacin (SLO-NIACIN) 500 MG tablet Take 500 mg by mouth at bedtime.    Marland Kitchen PRESCRIPTION MEDICATION PGE-Inject 20 mcg prn    . sildenafil (REVATIO) 20 MG tablet Take 20 mg by mouth as needed.    .  timolol (TIMOPTIC) 0.5 % ophthalmic solution Place 1 drop into both eyes daily.     No current facility-administered medications for this visit.   Facility-Administered Medications Ordered in Other Visits  Medication Dose Route Frequency Provider Last Rate Last Dose  . iopamidol (ISOVUE-300) 61 % injection 75 mL  75 mL Intravenous Once PRN Ivin Poot, MD        No Known Allergies  Review of Systems        Review of Systems :  [ y ] = yes, [  ] = no        General :  Weight gain [ No  ]    Weight loss  [   ]  Fatigue [no ]  Fever [  ]  Chills  [  ]                                Weakness  [ no ]           Cardiac :  Chest pain/ pressure [  ]  Resting SOB [  ] exertional SOB [  ]  Orthopnea [  ]  Pedal edema  [  ]  Palpitations [  ] Syncope/presyncope [ ]                         Paroxysmal nocturnal dyspnea [  ]        Pulmonary : cough [  ]  wheezing [  ]  Hemoptysis [  ] Sputum [  ] Snoring [  ]                              Pneumothorax [  ]  Sleep apnea [  ]       GI : Vomiting [  ]  Dysphagia [  ]  Melena  [  ]  Abdominal pain [  ] BRBPR [  ]              Heart burn [  ]  Constipation [  ] Diarrhea  [  ] Colonoscopy [  ]       GU : Hematuria [  ]  Dysuria [  ]  Nocturia [  ] UTI's [  ]       Vascular : Claudication [  ]  Rest pain [  ]  DVT [  ] Vein stripping [  ] leg ulcers [  ]                          TIA [  ] Stroke [  ]  Varicose veins [  ]       NEURO :  Headaches  [  ] Seizures [  ] Vision changes [  ] Paresthesias [  ] right-hand dominant       Musculoskeletal :  Arthritis [  ] Gout  [  ]  Back pain [  ]  Joint pain [  ]       Skin :  Rash [  ]  Melanoma [  ]        Heme : Bleeding problems [  ]Clotting Disorders [  ] Anemia [  ]Blood Transfusion [ ]        Endocrine : Diabetes [  ] Thyroid Disorder  [  ]       Psych : Depression [  ]  Anxiety [  ]  Psych hospitalizations [  ]                                               BP 150/86 mmHg   Pulse 74  Resp 20  Ht 5\' 7"  (1.702 m)  Wt 194 lb (87.998 kg)  BMI 30.38 kg/m2  SpO2 98% Physical Exam       Physical Exam  General: overweight middle-aged Caucasian male no acute distress HEENT: Normocephalic pupils equal , dentition adequate Neck: Supple without JVD, adenopathy, or bruit Chest: Clear to auscultation, symmetrical breath sounds, no rhonchi, no tenderness             or deformity Cardiovascular: Regular rate and rhythm, no murmur, no gallop, peripheral pulses             palpable in all extremities Abdomen:  Soft, nontender, no palpable mass or organomegaly Extremities: Warm, well-perfused, no clubbing cyanosis edema  or tenderness,              no venous stasis changes of the legs Rectal/GU: Deferred Neuro: Grossly non--focal and symmetrical throughout Skin: Clean and dry without rash or ulceration   Diagnostic Tests: CT scan performed a purse reviewed in consultation and wife. 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 There is no indication this is changed since his previous scan 6 months ago. Since the patient is young without significant comorbid disease I recommended resection which would involve a limited sternotomy and a 5 day hospitalization. Prior to surgery he'll need cardiac clearance because of a strong family history of coronary disease. We will refer  the patient to Dr Tollie Eth and then schedule surgery based on Dr. Wynonia Lawman is recommendation.  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  Len Childs, MD [facility] [phone]

## 2015-02-15 ENCOUNTER — Ambulatory Visit: Payer: Self-pay | Admitting: Cardiothoracic Surgery

## 2015-02-19 ENCOUNTER — Encounter: Payer: Self-pay | Admitting: Cardiology

## 2015-02-19 NOTE — Progress Notes (Signed)
Patient ID: Nicholas Daniels, male   DOB: 09-Mar-1951, 64 y.o.   MRN: [id number]   Nicholas, Daniels    Date of visit:  02/19/2015 DOB:  02-Dec-1950    Age:  64 yrs. Medical record number:  [id number]     Account number:  [id number] Primary Care Provider: Donnajean Daniels ____________________________ CURRENT DIAGNOSES  1. Encounter for preprocedural cardiovascular examination  2. Malignant Neoplasm Of Prostate  3. Essential hypertension  4. CAD Native without angina ____________________________ ALLERGIES  No Known Allergies ____________________________ MEDICATIONS  1. diltiazem ER 120 mg capsule,extended release, 1 p.o. daily  2. aspirin 325 mg tablet, 1 p.o. daily  3. timolol 0.5 % eye drops, one drop into both eyes daily  4. sildenafil 20 mg tablet, Take as directed  5. Slo-Niacin 500 mg tablet,extended release, QHS  6. Xalatan 0.005 % eye drops, one drop into both eyes at bedtime  7. multivitamin tablet, 1 p.o. daily ____________________________ CHIEF COMPLAINTS  Surgical clearance ____________________________ HISTORY OF PRESENT ILLNESS This very nice 64 year old male seen for preoperative cardiac evaluation. The patient has previously been in good health except for hypertension and a history of prostate cancer. He underwent prostatectomy in 2009 and later had recurrence treated with radiation therapy. He now has a rising PSA and is being considered for treatment protocols with hormonal therapy. As part of the screening he was found to have a calcified mediastinal mass. He has been evaluated for this in the evaluation has included a coronary CT angiogram that was done in June that showed 2 nonobstructive plaques in the LAD, mild plaque of the circumflex and a normal right coronary artery. Calcium score was 75 at the time. The patient has no angina and has no cardiovascular symptoms. He was laid off from his job earlier in the year and is working part time. He denies PND, orthopnea, syncope, or  claudication and has reasonable exercise tolerance. ____________________________ PAST HISTORY  Past Medical Illnesses:  hypertension, obesity, glaucoma, prostate cancer treated with surgery and radiation;  Cardiovascular Illnesses:  no previous history of cardiac disease;  Surgical Procedures:  cholecystectomy (lap), robotic prostatectomy;  NYHA Classification:  I;  Canadian Angina Classification:  Class 0: Asymptomatic;  Cardiology Procedures-Invasive:  no history of prior cardiac procedures;  Cardiology Procedures-Noninvasive:  treadmill;  LVEF not documented,   ____________________________ CARDIO-PULMONARY TEST DATES EKG Date:  02/19/2015;   ____________________________ FAMILY HISTORY Brother -- Brother dead, Malignant neoplasm of liver Brother -- Brother dead, Heart disease Brother -- Brother alive and well Father -- Father dead, COPD Mother -- Mother dead, Heart disease ____________________________ SOCIAL HISTORY Alcohol Use:  no alcohol use;  Smoking:  never smoked;  Diet:  regular diet;  Lifestyle:  married;  Exercise:  no regular exercise;  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 Eyes: wears eye glasses/contact lenses, glaucoma Ears, Nose, Throat, Mouth:  denies any hearing loss, epistaxis, hoarseness or difficulty speaking. Respiratory: denies dyspnea, cough, wheezing or hemoptysis. Cardiovascular:  please review HPI Abdominal: history of GERD Genitourinary-Male: incontinence, erectile dysfunction  Musculoskeletal:  arthritis of the ankle Neurological:  denies headaches, stroke, or TIA Psychiatric:  situational stress Hematological/Immunologic:  denies any food allergies, bleeding disorders. ____________________________ PHYSICAL EXAMINATION VITAL SIGNS  Blood Pressure:  130/70 Sitting, Right arm, regular cuff  , 134/70 Standing,  Right arm and regular cuff   Pulse:  76/min. Weight:  193.00 lbs. Height:  67"BMI: 30  Constitutional:  pleasant white male in no acute distress, mildly obese Skin:  warm and dry to touch, no apparent skin lesions, or masses noted. Head:  normocephalic, balding male hair pattern Eyes:  EOMS Intact, PERRLA, C and S clear, Funduscopic exam not done. ENT:  ears, nose and throat reveal no gross abnormalities.  Dentition good. Neck:  supple, without massess. No JVD, thyromegaly or carotid bruits. Carotid upstroke normal. Chest:  normal symmetry, clear to auscultation. Cardiac:  regular rhythm, normal S1 and S2, No S3 or S4, no murmurs, gallops or rubs detected. Abdomen:  abdomen soft, no masses, no hepatosplenomegaly, non-tender, no aneurysm, ventral hernia present Peripheral Pulses:  the femoral,dorsalis pedis, and posterior tibial pulses are full and equal bilaterally with no bruits auscultated. Extremities & Back:  no deformities, clubbing, cyanosis, erythema or edema observed. Normal muscle strength and tone. Neurological:  no gross motor or sensory deficits noted, affect appropriate, oriented x3. ____________________________ MOST RECENT LIPID PANEL 01/05/14  CHOL TOTL 165 mg/dl, LDL 91 NM, HDL 40 mg/dl and TRIGLYCER 171 mg/dl ____________________________ IMPRESSIONS/PLAN  1. From a cardiovascular viewpoint may proceed with the planned thoracic surgery. No additional cardiovascular workup is needed. He has had a thorough coronary evaluation earlier this year and has no symptoms in reasonable exercise tolerance. 2. Hypertension controlled 3. Calcified anterior mediastinal mass 4. History of metastatic prostate cancer  Recommendations:  May proceed with surgery. At this point in time he should have strict risk factor modification including treatment with a high density statin, low-dose aspirin and hypertension controlled. Please let me know if you need me to see him with you when he is in  the hospital. 12 lead EKG is normal.   ____________________________ TODAYS ORDERS  1. 12 Lead EKG: Today                       ____________________________ Cardiology Physician:  Nicholas Hough MD [facility]

## 2015-02-20 ENCOUNTER — Other Ambulatory Visit: Payer: Self-pay | Admitting: *Deleted

## 2015-02-20 DIAGNOSIS — J9859 Other diseases of mediastinum, not elsewhere classified: Secondary | ICD-10-CM

## 2015-02-22 ENCOUNTER — Encounter (HOSPITAL_COMMUNITY): Payer: Self-pay

## 2015-02-22 ENCOUNTER — Encounter (HOSPITAL_COMMUNITY)
Admission: RE | Admit: 2015-02-22 | Discharge: 2015-02-22 | Disposition: A | Discharge status: Home / Self Care | Payer: PPO | Source: Ambulatory Visit | Attending: Cardiothoracic Surgery | Admitting: Cardiothoracic Surgery

## 2015-02-22 VITALS — BP 142/74 | HR 71 | Temp 97.7°F | Resp 20 | Ht 67.0 in | Wt 191.8 lb

## 2015-02-22 DIAGNOSIS — Q211 Atrial septal defect: Secondary | ICD-10-CM | POA: Insufficient documentation

## 2015-02-22 DIAGNOSIS — Z8546 Personal history of malignant neoplasm of prostate: Secondary | ICD-10-CM | POA: Diagnosis not present

## 2015-02-22 DIAGNOSIS — I2510 Atherosclerotic heart disease of native coronary artery without angina pectoris: Secondary | ICD-10-CM | POA: Diagnosis not present

## 2015-02-22 DIAGNOSIS — Z01812 Encounter for preprocedural laboratory examination: Secondary | ICD-10-CM | POA: Insufficient documentation

## 2015-02-22 DIAGNOSIS — R222 Localized swelling, mass and lump, trunk: Secondary | ICD-10-CM | POA: Insufficient documentation

## 2015-02-22 DIAGNOSIS — Z0183 Encounter for blood typing: Secondary | ICD-10-CM | POA: Diagnosis not present

## 2015-02-22 DIAGNOSIS — J9859 Other diseases of mediastinum, not elsewhere classified: Secondary | ICD-10-CM

## 2015-02-22 DIAGNOSIS — H409 Unspecified glaucoma: Secondary | ICD-10-CM | POA: Diagnosis not present

## 2015-02-22 DIAGNOSIS — Z79899 Other long term (current) drug therapy: Secondary | ICD-10-CM | POA: Diagnosis not present

## 2015-02-22 DIAGNOSIS — I10 Essential (primary) hypertension: Secondary | ICD-10-CM | POA: Insufficient documentation

## 2015-02-22 DIAGNOSIS — Z923 Personal history of irradiation: Secondary | ICD-10-CM | POA: Diagnosis not present

## 2015-02-22 DIAGNOSIS — Z01818 Encounter for other preprocedural examination: Secondary | ICD-10-CM | POA: Insufficient documentation

## 2015-02-22 DIAGNOSIS — Z7982 Long term (current) use of aspirin: Secondary | ICD-10-CM | POA: Diagnosis not present

## 2015-02-22 HISTORY — DX: Cough: R05

## 2015-02-22 HISTORY — DX: Atrial septal defect: Q21.1

## 2015-02-22 HISTORY — DX: Cough, unspecified: R05.9

## 2015-02-22 HISTORY — DX: Patent foramen ovale: Q21.12

## 2015-02-22 LAB — COMPREHENSIVE METABOLIC PANEL
ALT: 25 U/L (ref 17–63)
AST: 27 U/L (ref 15–41)
Albumin: 3.8 g/dL (ref 3.5–5.0)
Alkaline Phosphatase: 24 U/L — ABNORMAL LOW (ref 38–126)
Anion gap: 9 (ref 5–15)
BUN: 12 mg/dL (ref 6–20)
CO2: 24 mmol/L (ref 22–32)
Calcium: 9 mg/dL (ref 8.9–10.3)
Chloride: 107 mmol/L (ref 101–111)
Creatinine, Ser: 0.93 mg/dL (ref 0.61–1.24)
GFR calc Af Amer: >60 mL/min (ref >60)
GFR calc non Af Amer: >60 mL/min (ref >60)
Glucose, Bld: 94 mg/dL (ref 65–99)
Potassium: 3.9 mmol/L (ref 3.5–5.1)
Sodium: 140 mmol/L (ref 135–145)
Total Bilirubin: 1.1 mg/dL (ref 0.3–1.2)
Total Protein: 6.7 g/dL (ref 6.5–8.1)

## 2015-02-22 LAB — URINALYSIS, ROUTINE W REFLEX MICROSCOPIC
Bilirubin Urine: NEGATIVE
Glucose, UA: NEGATIVE mg/dL
Hgb urine dipstick: NEGATIVE
Ketones, ur: NEGATIVE mg/dL
Leukocytes, UA: NEGATIVE
Nitrite: NEGATIVE
Protein, ur: NEGATIVE mg/dL
Specific Gravity, Urine: 1.011 (ref 1.005–1.030)
pH: 5.5 (ref 5.0–8.0)

## 2015-02-22 LAB — CBC
HCT: 41.8 % (ref 39.0–52.0)
Hemoglobin: 14.4 g/dL (ref 13.0–17.0)
MCH: 30.3 pg (ref 26.0–34.0)
MCHC: 34.4 g/dL (ref 30.0–36.0)
MCV: 87.8 fL (ref 78.0–100.0)
Platelets: 245 10*3/uL (ref 150–400)
RBC: 4.76 MIL/uL (ref 4.22–5.81)
RDW: 12.6 % (ref 11.5–15.5)
WBC: 5 10*3/uL (ref 4.0–10.5)

## 2015-02-22 LAB — BLOOD GAS, ARTERIAL
Acid-Base Excess: 1.2 mmol/L (ref 0.0–2.0)
Bicarbonate: 25.4 mEq/L — ABNORMAL HIGH (ref 20.0–24.0)
Drawn by: 42180
FIO2: 0.21
O2 Saturation: 97.6 %
Patient temperature: 98.6
TCO2: 26.6 mmol/L (ref 0–100)
pCO2 arterial: 41 mmHg (ref 35.0–45.0)
pH, Arterial: 7.408 (ref 7.350–7.450)
pO2, Arterial: 98.1 mmHg (ref 80.0–100.0)

## 2015-02-22 LAB — PROTIME-INR
INR: 1.07 (ref 0.00–1.49)
Prothrombin Time: 14.1 seconds (ref 11.6–15.2)

## 2015-02-22 LAB — SURGICAL PCR SCREEN
MRSA, PCR: NEGATIVE
Staphylococcus aureus: POSITIVE — AB

## 2015-02-22 LAB — ABO/RH: ABO/RH(D): A POS

## 2015-02-22 LAB — APTT: aPTT: 26 seconds (ref 24–37)

## 2015-02-22 NOTE — Pre-Procedure Instructions (Signed)
    ELAY TRANI  02/22/2015      [id number] [place], [place] - [address] AT [place] [address] [place] [address] Phone: [phone] Fax: [phone]    Your procedure is scheduled on 02/26/15.  Report to [facility] Admitting at 530 A.M.  Call this number if you have problems the morning of surgery:  [phone]   Remember:  Do not eat food or drink liquids after midnight.  Take these medicines the morning of surgery with A SIP OF WATER --diltiazem,eye drops   Do not wear jewelry, make-up or nail polish.  Do not wear lotions, powders, or perfumes.  You may wear deodorant.  Do not shave 48 hours prior to surgery.  Men may shave face and neck.  Do not bring valuables to the hospital.  [facility] is not responsible for any belongings or valuables.  Contacts, dentures or bridgework may not be worn into surgery.  Leave your suitcase in the car.  After surgery it may be brought to your room.  For patients admitted to the hospital, discharge time will be determined by your treatment team.  Patients discharged the day of surgery will not be allowed to drive home.   Name and phone number of your driver:    Special instructions:    Please read over the following fact sheets that you were given. Pain Booklet, Coughing and Deep Breathing, Blood Transfusion Information, MRSA Information and Surgical Site Infection Prevention

## 2015-02-23 ENCOUNTER — Encounter (HOSPITAL_COMMUNITY): Payer: Self-pay

## 2015-02-23 NOTE — Progress Notes (Signed)
Anesthesia Chart Review: Patient is a 64 year old male scheduled for sternotomy, resection of mediastinal mass on 02/26/15 by Dr. Prescott Gum. DX: Mediastinal mass (possibly thymoma).  History includes never smoker, HTN, glaucoma, cholecystectomy, non-obstructive CAD and PFO by coronary CTA 08/2014, prostate cancer s/p prostatectomy '09 with recurrence s/p radiation with incidental finding of mediastinal mass during his work-up. PCP is Dr. Leanna Battles. Urologist is Dr. Risa Grill.  He was referred to cardiologist Dr. Wynonia Lawman for a pre-operative evaluation on 02/19/15. Dr. Wynonia Lawman wrote, "1. From a cardiovascular viewpoint may proceed with the planned thoracic surgery. No additional cardiovascular workup is needed. He has had a thorough coronary evaluation earlier this year and has no symptoms in reasonable exercise tolerance." His notes indicate patient had a prior treadmill stress test (date?).   Meds include ASA, diltiazem, flaxseed oil, Xalatan, niacin, sildenafil, timolol ophthalmic.   02/19/15 EKG (Dr. Wynonia Lawman): NSR.  09/05/14 Coronary CT: IMPRESSION: 1. Coronary calcium score of 75. This was 61 percentile for age and sex matched control. 2. Non-obstructive calcified plaque in mid LAD. Risk factor modification is recommended. 3. There is circular rim calcified mass measuring 27 x 28 x 34 mm present in the anterior mediastinum. The mass is located anteriorly to the ascending aorta, adjacent to the pericardium, but no signs of invasion into pericardium or any cardiac structures. For further characterization please see [facility] Radiology report. 4. No mass was identified within the pericardium. No coronary aneurysm was seen. 5. Present PFO. ADDENDUM: There is a round peripherally calcified lesion the anterior mediastinum measuring 2.7 by 2.7 cm. This lesion demonstrates no post-contrast enhancement and is external to the pericardium indicated this is not and aneurysmal lesion. Differential  remains a calcified lymph node versus calcified thymoma or teratoma. No aggressive features.  He is for a CXR on the day of surgery.   Preoperative labs noted.   If no acute changes then plan to proceed.  George Hugh [facility] Short Stay Center/Anesthesiology Phone [phone] 02/23/2015 9:37 AM

## 2015-02-26 ENCOUNTER — Inpatient Hospital Stay (HOSPITAL_COMMUNITY)
Admission: RE | Admit: 2015-02-26 | Discharge: 2015-03-01 | DRG: 167 | Disposition: A | Discharge status: Home / Self Care | Payer: PPO | Source: Ambulatory Visit | Attending: Cardiothoracic Surgery | Admitting: Cardiothoracic Surgery

## 2015-02-26 ENCOUNTER — Encounter (HOSPITAL_COMMUNITY)
Admission: RE | Disposition: A | Discharge status: Home / Self Care | Payer: Self-pay | Source: Ambulatory Visit | Attending: Cardiothoracic Surgery

## 2015-02-26 ENCOUNTER — Inpatient Hospital Stay (HOSPITAL_COMMUNITY): Payer: PPO

## 2015-02-26 ENCOUNTER — Encounter (HOSPITAL_COMMUNITY): Payer: Self-pay | Admitting: *Deleted

## 2015-02-26 ENCOUNTER — Inpatient Hospital Stay (HOSPITAL_COMMUNITY): Payer: PPO | Admitting: Vascular Surgery

## 2015-02-26 ENCOUNTER — Inpatient Hospital Stay (HOSPITAL_COMMUNITY): Payer: PPO | Admitting: Anesthesiology

## 2015-02-26 DIAGNOSIS — D62 Acute posthemorrhagic anemia: Secondary | ICD-10-CM | POA: Diagnosis not present

## 2015-02-26 DIAGNOSIS — H409 Unspecified glaucoma: Secondary | ICD-10-CM | POA: Diagnosis present

## 2015-02-26 DIAGNOSIS — Z794 Long term (current) use of insulin: Secondary | ICD-10-CM | POA: Diagnosis not present

## 2015-02-26 DIAGNOSIS — D152 Benign neoplasm of mediastinum: Secondary | ICD-10-CM | POA: Diagnosis not present

## 2015-02-26 DIAGNOSIS — J9859 Other diseases of mediastinum, not elsewhere classified: Secondary | ICD-10-CM | POA: Diagnosis present

## 2015-02-26 DIAGNOSIS — I119 Hypertensive heart disease without heart failure: Secondary | ICD-10-CM | POA: Diagnosis present

## 2015-02-26 DIAGNOSIS — R0602 Shortness of breath: Secondary | ICD-10-CM

## 2015-02-26 DIAGNOSIS — Z8546 Personal history of malignant neoplasm of prostate: Secondary | ICD-10-CM

## 2015-02-26 DIAGNOSIS — J939 Pneumothorax, unspecified: Secondary | ICD-10-CM

## 2015-02-26 DIAGNOSIS — R222 Localized swelling, mass and lump, trunk: Secondary | ICD-10-CM | POA: Diagnosis not present

## 2015-02-26 HISTORY — PX: RESECTION OF MEDIASTINAL MASS: SHX6497

## 2015-02-26 HISTORY — PX: STERNOTOMY: SHX1057

## 2015-02-26 LAB — GLUCOSE, CAPILLARY
Glucose-Capillary: 104 mg/dL — ABNORMAL HIGH (ref 65–99)
Glucose-Capillary: 95 mg/dL (ref 65–99)

## 2015-02-26 LAB — BLOOD GAS, ARTERIAL
ACID-BASE DEFICIT: 0.2 mmol/L (ref 0.0–2.0)
BICARBONATE: 24.6 meq/L — AB (ref 20.0–24.0)
O2 SAT: 90.9 %
PH ART: 7.358 (ref 7.350–7.450)
PO2 ART: 63.4 mmHg — AB (ref 80.0–100.0)
Patient temperature: 98.6
TCO2: 26 mmol/L (ref 0–100)
pCO2 arterial: 45 mmHg (ref 35.0–45.0)

## 2015-02-26 LAB — PREPARE RBC (CROSSMATCH)

## 2015-02-26 SURGERY — STERNOTOMY
Anesthesia: General

## 2015-02-26 MED ORDER — INSULIN ASPART 100 UNIT/ML ~~LOC~~ SOLN: 0.0000 [IU] | Freq: Four times a day (QID) | SUBCUTANEOUS | Status: DC

## 2015-02-26 MED ORDER — POTASSIUM CHLORIDE 10 MEQ/50ML IV SOLN: 10.0000 meq | Freq: Every day | INTRAVENOUS | Status: DC | PRN

## 2015-02-26 MED ORDER — NIACIN ER 500 MG PO CPCR
500.0000 mg | ORAL_CAPSULE | Freq: Every day | ORAL | Status: DC
  Administered 2015-02-26 – 2015-02-28 (×3): 500 mg via ORAL
  Filled 2015-02-26 (×4): qty 1

## 2015-02-26 MED ORDER — SODIUM CHLORIDE 0.9 % IJ SOLN: 10.0000 mL | INTRAMUSCULAR | Status: DC | PRN

## 2015-02-26 MED ORDER — SUGAMMADEX SODIUM 500 MG/5ML IV SOLN
INTRAVENOUS | Status: AC
  Filled 2015-02-26: qty 5

## 2015-02-26 MED ORDER — KETOROLAC TROMETHAMINE 30 MG/ML IJ SOLN
INTRAMUSCULAR | Status: AC
  Filled 2015-02-26: qty 1

## 2015-02-26 MED ORDER — ESMOLOL HCL 100 MG/10ML IV SOLN
INTRAVENOUS | Status: AC
  Filled 2015-02-26: qty 10

## 2015-02-26 MED ORDER — HYDROMORPHONE HCL 1 MG/ML IJ SOLN
0.2500 mg | INTRAMUSCULAR | Status: DC | PRN
  Administered 2015-02-26: 0.5 mg via INTRAVENOUS

## 2015-02-26 MED ORDER — KCL IN DEXTROSE-NACL 20-5-0.2 MEQ/L-%-% IV SOLN
INTRAVENOUS | Status: DC
  Administered 2015-02-26 (×2): via INTRAVENOUS
  Filled 2015-02-26 (×3): qty 1000

## 2015-02-26 MED ORDER — LATANOPROST 0.005 % OP SOLN
1.0000 [drp] | Freq: Every day | OPHTHALMIC | Status: DC
  Administered 2015-02-26 – 2015-02-28 (×3): 1 [drp] via OPHTHALMIC
  Filled 2015-02-26: qty 2.5

## 2015-02-26 MED ORDER — LACTATED RINGERS IV SOLN
INTRAVENOUS | Status: DC | PRN
  Administered 2015-02-26: 07:00:00 via INTRAVENOUS

## 2015-02-26 MED ORDER — OXYCODONE HCL 5 MG/5ML PO SOLN: 5.0000 mg | Freq: Once | ORAL | Status: DC | PRN

## 2015-02-26 MED ORDER — OXYCODONE HCL 5 MG PO TABS: 5.0000 mg | ORAL_TABLET | Freq: Once | ORAL | Status: DC | PRN

## 2015-02-26 MED ORDER — SUGAMMADEX SODIUM 500 MG/5ML IV SOLN
INTRAVENOUS | Status: DC | PRN
  Administered 2015-02-26: 173.2 mg via INTRAVENOUS

## 2015-02-26 MED ORDER — DILTIAZEM HCL ER 120 MG PO CP24
120.0000 mg | ORAL_CAPSULE | Freq: Every day | ORAL | Status: DC
  Administered 2015-02-27: 120 mg via ORAL
  Filled 2015-02-26 (×2): qty 1

## 2015-02-26 MED ORDER — LIDOCAINE HCL (CARDIAC) 20 MG/ML IV SOLN
INTRAVENOUS | Status: DC | PRN
  Administered 2015-02-26: 80 mg via INTRAVENOUS

## 2015-02-26 MED ORDER — ROCURONIUM BROMIDE 50 MG/5ML IV SOLN
INTRAVENOUS | Status: AC
  Filled 2015-02-26: qty 1

## 2015-02-26 MED ORDER — LABETALOL HCL 5 MG/ML IV SOLN
10.0000 mg | INTRAVENOUS | Status: DC | PRN
  Administered 2015-02-26 (×2): 10 mg via INTRAVENOUS
  Filled 2015-02-26: qty 4

## 2015-02-26 MED ORDER — ONDANSETRON HCL 4 MG/2ML IJ SOLN
INTRAMUSCULAR | Status: DC | PRN
  Administered 2015-02-26: 4 mg via INTRAVENOUS

## 2015-02-26 MED ORDER — MIDAZOLAM HCL 2 MG/2ML IJ SOLN
INTRAMUSCULAR | Status: AC
  Filled 2015-02-26: qty 2

## 2015-02-26 MED ORDER — SENNOSIDES-DOCUSATE SODIUM 8.6-50 MG PO TABS
1.0000 | ORAL_TABLET | Freq: Every day | ORAL | Status: DC
  Administered 2015-02-26 – 2015-02-27 (×2): 1 via ORAL
  Filled 2015-02-26 (×4): qty 1

## 2015-02-26 MED ORDER — EPHEDRINE SULFATE 50 MG/ML IJ SOLN
INTRAMUSCULAR | Status: AC
  Filled 2015-02-26: qty 1

## 2015-02-26 MED ORDER — DEXTROSE 5 % IV SOLN
1.5000 g | Freq: Two times a day (BID) | INTRAVENOUS | Status: AC
  Administered 2015-02-26 – 2015-02-27 (×2): 1.5 g via INTRAVENOUS
  Filled 2015-02-26 (×2): qty 1.5

## 2015-02-26 MED ORDER — ACETAMINOPHEN 500 MG PO TABS
1000.0000 mg | ORAL_TABLET | Freq: Four times a day (QID) | ORAL | Status: DC
  Administered 2015-02-26 – 2015-03-01 (×9): 1000 mg via ORAL
  Filled 2015-02-26 (×16): qty 2

## 2015-02-26 MED ORDER — KETOROLAC TROMETHAMINE 30 MG/ML IJ SOLN
INTRAMUSCULAR | Status: DC | PRN
  Administered 2015-02-26: 30 mg via INTRAVENOUS

## 2015-02-26 MED ORDER — SUCCINYLCHOLINE CHLORIDE 20 MG/ML IJ SOLN
INTRAMUSCULAR | Status: AC
  Filled 2015-02-26: qty 1

## 2015-02-26 MED ORDER — ACETAMINOPHEN 160 MG/5ML PO SOLN
1000.0000 mg | Freq: Four times a day (QID) | ORAL | Status: DC
Start: 2015-02-26 — End: 2015-03-01

## 2015-02-26 MED ORDER — TRAMADOL HCL 50 MG PO TABS
50.0000 mg | ORAL_TABLET | Freq: Four times a day (QID) | ORAL | Status: DC | PRN
  Administered 2015-02-28 (×2): 50 mg via ORAL
  Filled 2015-02-26 (×2): qty 1

## 2015-02-26 MED ORDER — LIDOCAINE HCL (CARDIAC) 20 MG/ML IV SOLN
INTRAVENOUS | Status: AC
  Filled 2015-02-26: qty 5

## 2015-02-26 MED ORDER — ADULT MULTIVITAMIN W/MINERALS CH: 1.0000 | ORAL_TABLET | Freq: Every day | ORAL | Status: DC

## 2015-02-26 MED ORDER — PANTOPRAZOLE SODIUM 40 MG PO TBEC
40.0000 mg | DELAYED_RELEASE_TABLET | Freq: Every day | ORAL | Status: DC
  Administered 2015-02-27 – 2015-03-01 (×3): 40 mg via ORAL
  Filled 2015-02-26 (×3): qty 1

## 2015-02-26 MED ORDER — ONDANSETRON HCL 4 MG/2ML IJ SOLN
4.0000 mg | Freq: Four times a day (QID) | INTRAMUSCULAR | Status: DC | PRN
  Administered 2015-02-27 (×2): 4 mg via INTRAVENOUS
  Filled 2015-02-26 (×4): qty 2

## 2015-02-26 MED ORDER — MEPERIDINE HCL 25 MG/ML IJ SOLN: 6.2500 mg | INTRAMUSCULAR | Status: DC | PRN

## 2015-02-26 MED ORDER — ESMOLOL HCL 100 MG/10ML IV SOLN
INTRAVENOUS | Status: DC | PRN
  Administered 2015-02-26 (×3): 30 mg via INTRAVENOUS

## 2015-02-26 MED ORDER — PROPOFOL 10 MG/ML IV BOLUS
INTRAVENOUS | Status: DC | PRN
  Administered 2015-02-26: 160 mg via INTRAVENOUS

## 2015-02-26 MED ORDER — FENTANYL CITRATE (PF) 250 MCG/5ML IJ SOLN
INTRAMUSCULAR | Status: AC
  Filled 2015-02-26: qty 5

## 2015-02-26 MED ORDER — MIDAZOLAM HCL 5 MG/5ML IJ SOLN
INTRAMUSCULAR | Status: DC | PRN
  Administered 2015-02-26 (×2): 1 mg via INTRAVENOUS

## 2015-02-26 MED ORDER — SODIUM CHLORIDE 0.9 % IJ SOLN
OROMUCOSAL | Status: DC | PRN
  Administered 2015-02-26 (×2): 4 mL via TOPICAL

## 2015-02-26 MED ORDER — MUPIROCIN 2 % EX OINT
TOPICAL_OINTMENT | Freq: Two times a day (BID) | CUTANEOUS | Status: AC
  Administered 2015-02-26 – 2015-02-27 (×3): via NASAL
  Filled 2015-02-26 (×2): qty 22

## 2015-02-26 MED ORDER — PROPOFOL 10 MG/ML IV BOLUS
INTRAVENOUS | Status: AC
  Filled 2015-02-26: qty 20

## 2015-02-26 MED ORDER — DEXTROSE 5 % IV SOLN
INTRAVENOUS | Status: AC
  Filled 2015-02-26: qty 1.5

## 2015-02-26 MED ORDER — ADULT MULTIVITAMIN W/MINERALS CH
1.0000 | ORAL_TABLET | Freq: Two times a day (BID) | ORAL | Status: DC
  Administered 2015-02-26 – 2015-03-01 (×6): 1 via ORAL
  Filled 2015-02-26 (×8): qty 1

## 2015-02-26 MED ORDER — FENTANYL CITRATE (PF) 100 MCG/2ML IJ SOLN
INTRAMUSCULAR | Status: DC | PRN
  Administered 2015-02-26: 50 ug via INTRAVENOUS
  Administered 2015-02-26: 100 ug via INTRAVENOUS
  Administered 2015-02-26: 50 ug via INTRAVENOUS
  Administered 2015-02-26: 100 ug via INTRAVENOUS
  Administered 2015-02-26 (×2): 50 ug via INTRAVENOUS

## 2015-02-26 MED ORDER — OXYCODONE HCL 5 MG PO TABS
5.0000 mg | ORAL_TABLET | ORAL | Status: DC | PRN
  Administered 2015-02-26 (×2): 5 mg via ORAL
  Administered 2015-02-27 (×2): 10 mg via ORAL
  Filled 2015-02-26 (×2): qty 2
  Filled 2015-02-26 (×2): qty 1

## 2015-02-26 MED ORDER — ASPIRIN 325 MG PO TABS
325.0000 mg | ORAL_TABLET | Freq: Every day | ORAL | Status: DC
  Administered 2015-02-27 – 2015-03-01 (×3): 325 mg via ORAL
  Filled 2015-02-26 (×4): qty 1

## 2015-02-26 MED ORDER — BISACODYL 5 MG PO TBEC
10.0000 mg | DELAYED_RELEASE_TABLET | Freq: Every day | ORAL | Status: DC
  Administered 2015-02-27 – 2015-02-28 (×2): 10 mg via ORAL
  Filled 2015-02-26 (×3): qty 2

## 2015-02-26 MED ORDER — FENTANYL CITRATE (PF) 100 MCG/2ML IJ SOLN: 25.0000 ug | INTRAMUSCULAR | Status: DC | PRN

## 2015-02-26 MED ORDER — LACTATED RINGERS IV SOLN
INTRAVENOUS | Status: DC | PRN
  Administered 2015-02-26: 08:00:00 via INTRAVENOUS

## 2015-02-26 MED ORDER — SUCCINYLCHOLINE 20MG/ML (10ML) SYRINGE FOR MEDFUSION PUMP - OPTIME
INTRAMUSCULAR | Status: DC | PRN
Start: 2015-02-26 — End: 2015-02-26
  Administered 2015-02-26: 120 mg via INTRAVENOUS

## 2015-02-26 MED ORDER — ARTIFICIAL TEARS OP OINT
TOPICAL_OINTMENT | OPHTHALMIC | Status: AC
  Filled 2015-02-26: qty 3.5

## 2015-02-26 MED ORDER — 0.9 % SODIUM CHLORIDE (POUR BTL) OPTIME
TOPICAL | Status: DC | PRN
  Administered 2015-02-26: 5000 mL

## 2015-02-26 MED ORDER — KETOROLAC TROMETHAMINE 15 MG/ML IJ SOLN
15.0000 mg | Freq: Four times a day (QID) | INTRAMUSCULAR | Status: AC
  Administered 2015-02-26 – 2015-02-27 (×6): 15 mg via INTRAVENOUS
  Filled 2015-02-26 (×7): qty 1

## 2015-02-26 MED ORDER — ONDANSETRON HCL 4 MG/2ML IJ SOLN
INTRAMUSCULAR | Status: AC
  Filled 2015-02-26: qty 2

## 2015-02-26 MED ORDER — PHENYLEPHRINE HCL 10 MG/ML IJ SOLN
10.0000 mg | INTRAVENOUS | Status: DC | PRN
  Administered 2015-02-26: 40 ug/min via INTRAVENOUS

## 2015-02-26 MED ORDER — SODIUM CHLORIDE 0.9 % IJ SOLN
INTRAMUSCULAR | Status: AC
  Filled 2015-02-26: qty 10

## 2015-02-26 MED ORDER — SODIUM CHLORIDE 0.9 % IJ SOLN
10.0000 mL | Freq: Two times a day (BID) | INTRAMUSCULAR | Status: DC
  Administered 2015-02-26 – 2015-02-28 (×3): 10 mL

## 2015-02-26 MED ORDER — HYDROMORPHONE HCL 1 MG/ML IJ SOLN
INTRAMUSCULAR | Status: AC
  Administered 2015-02-26: 0.5 mg via INTRAVENOUS
  Filled 2015-02-26: qty 1

## 2015-02-26 MED ORDER — ROCURONIUM BROMIDE 100 MG/10ML IV SOLN
INTRAVENOUS | Status: DC | PRN
  Administered 2015-02-26 (×5): 10 mg via INTRAVENOUS

## 2015-02-26 MED ORDER — DEXTROSE 5 % IV SOLN
1.5000 g | INTRAVENOUS | Status: AC
  Administered 2015-02-26: 1.5 g via INTRAVENOUS

## 2015-02-26 MED ORDER — TIMOLOL MALEATE 0.5 % OP SOLN
1.0000 [drp] | Freq: Every day | OPHTHALMIC | Status: DC
  Administered 2015-02-27 – 2015-03-01 (×3): 1 [drp] via OPHTHALMIC
  Filled 2015-02-26: qty 5

## 2015-02-26 MED ORDER — PHENYLEPHRINE 40 MCG/ML (10ML) SYRINGE FOR IV PUSH (FOR BLOOD PRESSURE SUPPORT)
PREFILLED_SYRINGE | INTRAVENOUS | Status: AC
  Filled 2015-02-26: qty 10

## 2015-02-26 MED ORDER — LABETALOL HCL 5 MG/ML IV SOLN
INTRAVENOUS | Status: AC
  Administered 2015-02-26: 10 mg via INTRAVENOUS
  Filled 2015-02-26: qty 4

## 2015-02-26 MED ORDER — ARTIFICIAL TEARS OP OINT
TOPICAL_OINTMENT | OPHTHALMIC | Status: DC | PRN
  Administered 2015-02-26: 1 via OPHTHALMIC

## 2015-02-26 SURGICAL SUPPLY — 93 items
ADAPTER CARDIO PERF ANTE/RETRO (ADAPTER) IMPLANT
BAG DECANTER FOR FLEXI CONT (MISCELLANEOUS) IMPLANT
BANDAGE ELASTIC 4 VELCRO ST LF (GAUZE/BANDAGES/DRESSINGS) IMPLANT
BANDAGE ELASTIC 6 VELCRO ST LF (GAUZE/BANDAGES/DRESSINGS) IMPLANT
BASKET HEART  (ORDER IN 25'S) (MISCELLANEOUS)
BASKET HEART (ORDER IN 25'S) (MISCELLANEOUS)
BASKET HEART (ORDER IN 25S) (MISCELLANEOUS) IMPLANT
BLADE STERNUM SYSTEM 6 (BLADE) ×3 IMPLANT
BLADE SURG 11 STRL SS (BLADE) ×3 IMPLANT
BLADE SURG 12 STRL SS (BLADE) ×3 IMPLANT
BLADE SURG ROTATE 9660 (MISCELLANEOUS) IMPLANT
BNDG GAUZE ELAST 4 BULKY (GAUZE/BANDAGES/DRESSINGS) IMPLANT
CANISTER SUCTION 2500CC (MISCELLANEOUS) ×3 IMPLANT
CANNULA GUNDRY RCSP 15FR (MISCELLANEOUS) IMPLANT
CATH CPB KIT VANTRIGT (MISCELLANEOUS) IMPLANT
CATH ROBINSON RED A/P 18FR (CATHETERS) IMPLANT
CATH THORACIC 28FR (CATHETERS) ×3 IMPLANT
CATH THORACIC 36FR RT ANG (CATHETERS) IMPLANT
CLIP TI MEDIUM 24 (CLIP) ×3 IMPLANT
CLIP TI WIDE RED SMALL 24 (CLIP) ×3 IMPLANT
CONN ST 1/4X3/8  BEN (MISCELLANEOUS) ×2
CONN ST 1/4X3/8 BEN (MISCELLANEOUS) ×1 IMPLANT
CONN Y 3/8X3/8X3/8  BEN (MISCELLANEOUS) ×2
CONN Y 3/8X3/8X3/8 BEN (MISCELLANEOUS) ×1 IMPLANT
COVER SURGICAL LIGHT HANDLE (MISCELLANEOUS) ×3 IMPLANT
CRADLE DONUT ADULT HEAD (MISCELLANEOUS) ×3 IMPLANT
DRAIN CHANNEL 32F RND 10.7 FF (WOUND CARE) ×3 IMPLANT
DRAPE CARDIOVASCULAR INCISE (DRAPES) ×2
DRAPE SLUSH/WARMER DISC (DRAPES) ×3 IMPLANT
DRAPE SRG 135X102X78XABS (DRAPES) ×1 IMPLANT
DRSG AQUACEL AG ADV 3.5X14 (GAUZE/BANDAGES/DRESSINGS) ×3 IMPLANT
ELECT BLADE 4.0 EZ CLEAN MEGAD (MISCELLANEOUS) ×3
ELECT BLADE 6.5 EXT (BLADE) ×3 IMPLANT
ELECT CAUTERY BLADE 6.4 (BLADE) ×3 IMPLANT
ELECT REM PT RETURN 9FT ADLT (ELECTROSURGICAL) ×6
ELECTRODE BLDE 4.0 EZ CLN MEGD (MISCELLANEOUS) ×1 IMPLANT
ELECTRODE REM PT RTRN 9FT ADLT (ELECTROSURGICAL) ×2 IMPLANT
GAUZE SPONGE 4X4 12PLY STRL (GAUZE/BANDAGES/DRESSINGS) ×3 IMPLANT
GLOVE BIO SURGEON STRL SZ7.5 (GLOVE) ×9 IMPLANT
GOWN STRL REUS W/ TWL LRG LVL3 (GOWN DISPOSABLE) ×4 IMPLANT
GOWN STRL REUS W/TWL LRG LVL3 (GOWN DISPOSABLE) ×8
HEMOSTAT POWDER SURGIFOAM 1G (HEMOSTASIS) ×6 IMPLANT
HEMOSTAT SURGICEL 2X14 (HEMOSTASIS) IMPLANT
INSERT FOGARTY XLG (MISCELLANEOUS) IMPLANT
KIT BASIN OR (CUSTOM PROCEDURE TRAY) ×3 IMPLANT
KIT ROOM TURNOVER OR (KITS) ×3 IMPLANT
KIT SUCTION CATH 14FR (SUCTIONS) ×3 IMPLANT
KIT VASOVIEW W/TROCAR VH 2000 (KITS) IMPLANT
LEAD PACING MYOCARDI (MISCELLANEOUS) IMPLANT
MARKER GRAFT CORONARY BYPASS (MISCELLANEOUS) IMPLANT
NS IRRIG 1000ML POUR BTL (IV SOLUTION) ×15 IMPLANT
PACK CHEST (CUSTOM PROCEDURE TRAY) ×3 IMPLANT
PACK OPEN HEART (CUSTOM PROCEDURE TRAY) IMPLANT
PAD ARMBOARD 7.5X6 YLW CONV (MISCELLANEOUS) ×12 IMPLANT
PAD ELECT DEFIB RADIOL ZOLL (MISCELLANEOUS) ×3 IMPLANT
PENCIL BUTTON HOLSTER BLD 10FT (ELECTRODE) ×3 IMPLANT
PUNCH AORTIC ROTATE 4.0MM (MISCELLANEOUS) IMPLANT
PUNCH AORTIC ROTATE 4.5MM 8IN (MISCELLANEOUS) IMPLANT
PUNCH AORTIC ROTATE 5MM 8IN (MISCELLANEOUS) IMPLANT
SURGIFLO W/THROMBIN 8M KIT (HEMOSTASIS) IMPLANT
SUT BONE WAX W31G (SUTURE) ×3 IMPLANT
SUT MNCRL AB 4-0 PS2 18 (SUTURE) IMPLANT
SUT PROLENE 3 0 SH DA (SUTURE) IMPLANT
SUT PROLENE 3 0 SH1 36 (SUTURE) IMPLANT
SUT PROLENE 4 0 RB 1 (SUTURE) ×2
SUT PROLENE 4 0 SH DA (SUTURE) IMPLANT
SUT PROLENE 4-0 RB1 .5 CRCL 36 (SUTURE) ×1 IMPLANT
SUT PROLENE 5 0 C 1 36 (SUTURE) IMPLANT
SUT PROLENE 6 0 C 1 30 (SUTURE) ×6 IMPLANT
SUT PROLENE 6 0 CC (SUTURE) IMPLANT
SUT PROLENE 8 0 BV175 6 (SUTURE) IMPLANT
SUT PROLENE BLUE 7 0 (SUTURE) IMPLANT
SUT SILK  1 MH (SUTURE) ×4
SUT SILK 1 MH (SUTURE) ×2 IMPLANT
SUT SILK 2 0 SH CR/8 (SUTURE) ×6 IMPLANT
SUT SILK 3 0 SH CR/8 (SUTURE) ×3 IMPLANT
SUT STEEL 6MS V (SUTURE) IMPLANT
SUT STEEL SZ 6 DBL 3X14 BALL (SUTURE) ×3 IMPLANT
SUT VIC AB 1 CTX 36 (SUTURE) ×4
SUT VIC AB 1 CTX36XBRD ANBCTR (SUTURE) ×2 IMPLANT
SUT VIC AB 2-0 CT1 27 (SUTURE)
SUT VIC AB 2-0 CT1 TAPERPNT 27 (SUTURE) IMPLANT
SUT VIC AB 2-0 CTX 27 (SUTURE) ×6 IMPLANT
SUT VIC AB 3-0 X1 27 (SUTURE) ×6 IMPLANT
SUTURE E-PAK OPEN HEART (SUTURE) IMPLANT
SYSTEM SAHARA CHEST DRAIN ATS (WOUND CARE) IMPLANT
TAPE CLOTH SURG 4X10 WHT LF (GAUZE/BANDAGES/DRESSINGS) ×3 IMPLANT
TOWEL OR 17X24 6PK STRL BLUE (TOWEL DISPOSABLE) ×6 IMPLANT
TOWEL OR 17X26 10 PK STRL BLUE (TOWEL DISPOSABLE) ×6 IMPLANT
TRAY FOLEY IC TEMP SENS 16FR (CATHETERS) ×3 IMPLANT
TUBING INSUFFLATION (TUBING) IMPLANT
UNDERPAD 30X30 INCONTINENT (UNDERPADS AND DIAPERS) ×3 IMPLANT
WATER STERILE IRR 1000ML POUR (IV SOLUTION) ×6 IMPLANT

## 2015-02-26 NOTE — Op Note (Signed)
Nicholas Daniels, Nicholas Daniels NO.:  [id number]  MEDICAL RECORD NO.:  [id number]  LOCATION:  2S03C                        FACILITY:  [facility]  PHYSICIAN:  Ivin Poot, M.D.  DATE OF BIRTH:  01/29/1951  DATE OF PROCEDURE: DATE OF DISCHARGE:                              OPERATIVE REPORT   OPERATION:  Median sternotomy, resection of anterior mediastinal mass.  SURGEON:  Ivin Poot, M.D.  ASSISTANT:  Hortencia Pilar, RNFA.  PREOPERATIVE DIAGNOSIS:  A 3 x 4 cm anterior mediastinal mass.  POSTOPERATIVE DIAGNOSIS:  A 3 x 4 cm anterior mediastinal mass.  ANESTHESIA:  General.  INDICATIONS:  The patient is a 64 year old hypertensive male, nonsmoker with history of prostate cancer.  Following prostate cancer surgery, serial CT scans demonstrated anterior mediastinal mass.  This was negative on PET scan, but calcified and close to the aorta and pericardium.  He had a cardiology evaluation and cardiology clearance prior to surgery.  I discussed the procedure of median sternotomy and resection of mediastinal mass in detail with the patient and wife including the indications, benefits, alternatives, and risks.  He understood the risks of the procedure to include the risks of bleeding, recurrent tumor, infection, pulmonary problems including the right hemidiaphragm dysfunction, and death.  After reviewing these issues, he demonstrated his understanding and agreed to proceed with surgery under what I felt was an informed consent.  OPERATIVE PROCEDURE:  The patient was placed supine on the operating table.  General anesthesia was induced under invasive hemodynamic monitoring.  The chest was prepped and draped as a sterile field.  A proper time-out was performed.  A sternal incision was made.  A sternotomy was made.  The sternum was gently spread.  The anterior mediastinal fat was dissected to the pericardium.  The pericardium was not opened.  The mass was palpated lateral to  the pericardium and marginally attached to the right upper lobe medially.  The right-sided sternal elevating retractor-rule track-was applied.  The mass was carefully dissected out from the mediastinal fat being careful to avoid injury to the right phrenic nerve.  The mass was complete on block, and there was minimal bleeding.  The right pleural space had been opened as the tumor involved right pleural membrane.  Anterior mediastinal and right pleural chest tubes were placed and brought out through separate incisions.  Hemostasis was adequate.  The sternum was closed with interrupted steel wire.  The pectoralis fascia was closed in running #1 Vicryl.  The subcutaneous and skin layers were closed in running Vicryl and sterile dressings were applied.  The chest tubes were connected to underwater seal Pleur-evac drainage system.  The patient was extubated and returned to recovery room.  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  Final pathology pending.     Ivin Poot, M.D.     PV/MEDQ  D:  02/26/2015  T:  02/26/2015  Job:  [id number]  cc:   Tollie Eth, Dr.

## 2015-02-26 NOTE — Progress Notes (Signed)
      [address]       [place] [address]             [phone]      S/p resection mediastinal mass  BP 137/74 mmHg  Pulse 82  Temp(Src) 98.8 F (37.1 C) (Oral)  Resp 16  Ht 5\' 7"  (1.702 m)  Wt 191 lb (86.637 kg)  BMI 29.91 kg/m2  SpO2 100%   Intake/Output Summary (Last 24 hours) at 02/26/15 1703 Last data filed at 02/26/15 1600  Gross per 24 hour  Intake   1480 ml  Output   1095 ml  Net    385 ml    Doing well early postop  Remo Lipps C. Roxan Hockey, MD [facility] [phone]

## 2015-02-26 NOTE — Progress Notes (Signed)
The patient was examined and preop studies reviewed. There has been no change from the prior exam and the patient is ready for surgery.   Plan resection of mediastinal mass on T Tatsch

## 2015-02-26 NOTE — Anesthesia Preprocedure Evaluation (Addendum)
Anesthesia Evaluation  Patient identified by MRN, date of birth, ID band Patient awake    Reviewed: Allergy & Precautions, NPO status , Patient's Chart, lab work & pertinent test results  Airway Mallampati: I  TM Distance: >3 FB Neck ROM: Full    Dental  (+) Teeth Intact, Dental Advisory Given   Pulmonary neg pulmonary ROS,    breath sounds clear to auscultation       Cardiovascular hypertension, Pt. on medications  Rhythm:Regular Rate:Normal     Neuro/Psych    GI/Hepatic negative GI ROS, Neg liver ROS,   Endo/Other  negative endocrine ROS  Renal/GU negative Renal ROS     Musculoskeletal   Abdominal   Peds  Hematology   Anesthesia Other Findings Hx of prostate cancer  Reproductive/Obstetrics                            Anesthesia Physical Anesthesia Plan  ASA: III  Anesthesia Plan: General   Post-op Pain Management:    Induction: Intravenous  Airway Management Planned: Oral ETT  Additional Equipment: CVP, Arterial line and Ultrasound Guidance Line Placement  Intra-op Plan:   Post-operative Plan: Possible Post-op intubation/ventilation  Informed Consent: I have reviewed the patients History and Physical, chart, labs and discussed the procedure including the risks, benefits and alternatives for the proposed anesthesia with the patient or authorized representative who has indicated his/her understanding and acceptance.   Dental advisory given  Plan Discussed with: CRNA, Anesthesiologist and Surgeon  Anesthesia Plan Comments:         Anesthesia Quick Evaluation

## 2015-02-26 NOTE — Anesthesia Procedure Notes (Addendum)
Procedure Name: Intubation Date/Time: 02/26/2015 7:42 AM Performed by: Suzy Bouchard Pre-anesthesia Checklist: Patient identified, Emergency Drugs available, Suction available and Patient being monitored Patient Re-evaluated:Patient Re-evaluated prior to inductionOxygen Delivery Method: Circle system utilized Preoxygenation: Pre-oxygenation with 100% oxygen Intubation Type: IV induction Ventilation: Oral airway inserted - appropriate to patient size Laryngoscope Size: Glidescope and 4 Grade View: Grade I Tube type: Oral Tube size: 8.0 mm Number of attempts: 2 Airway Equipment and Method: Oral airway and Rigid stylet Placement Confirmation: ETT inserted through vocal cords under direct vision,  positive ETCO2 and breath sounds checked- equal and bilateral Secured at: 25 cm Tube secured with: Tape Dental Injury: Teeth and Oropharynx as per pre-operative assessment  Comments: DLx1 with MAC 3 by SRNA, unable to obtain view. DL x1 with Glidescope with Grade 1 view. Intubated trachea.       Right IJ vein with needle in place

## 2015-02-26 NOTE — Transfer of Care (Signed)
Immediate Anesthesia Transfer of Care Note  Patient: Nicholas Daniels  Procedure(s) Performed: Procedure(s): STERNOTOMY (N/A) RESECTION OF MEDIASTINAL MASS (N/A)  Patient Location: PACU  Anesthesia Type:General  Level of Consciousness: awake, alert  and oriented  Airway & Oxygen Therapy: Patient Spontanous Breathing and Patient connected to face mask oxygen  Post-op Assessment: Report given to RN, Post -op Vital signs reviewed and stable and Patient moving all extremities X 4  Post vital signs: stable  Last Vitals:  Filed Vitals:   02/26/15 0554 02/26/15 0950  BP: 166/88   Pulse: 71   Temp: 37.2 C 36.6 C  Resp: 20     Complications: No apparent anesthesia complications

## 2015-02-26 NOTE — Anesthesia Postprocedure Evaluation (Signed)
Anesthesia Post Note  Patient: Nicholas Daniels  Procedure(s) Performed: Procedure(s) (LRB): STERNOTOMY (N/A) RESECTION OF MEDIASTINAL MASS (N/A)  Patient location during evaluation: PACU Anesthesia Type: General Level of consciousness: awake Pain management: pain level controlled Vital Signs Assessment: post-procedure vital signs reviewed and stable Respiratory status: spontaneous breathing Cardiovascular status: stable Anesthetic complications: no    Last Vitals:  Filed Vitals:   02/26/15 1600 02/26/15 1700  BP: 130/70 137/74  Pulse: 72 82  Temp: 37.1 C   Resp: 13 16    Last Pain:  Filed Vitals:   02/26/15 1703  PainSc: 1                  EDWARDS,Georgina Krist

## 2015-02-27 ENCOUNTER — Encounter (HOSPITAL_COMMUNITY): Payer: Self-pay | Admitting: Cardiothoracic Surgery

## 2015-02-27 ENCOUNTER — Inpatient Hospital Stay (HOSPITAL_COMMUNITY): Payer: PPO

## 2015-02-27 LAB — GLUCOSE, CAPILLARY
Glucose-Capillary: 117 mg/dL — ABNORMAL HIGH (ref 65–99)
Glucose-Capillary: 113 mg/dL — ABNORMAL HIGH (ref 65–99)
Glucose-Capillary: 120 mg/dL — ABNORMAL HIGH (ref 65–99)
Glucose-Capillary: 114 mg/dL — ABNORMAL HIGH (ref 65–99)
Glucose-Capillary: 86 mg/dL (ref 65–99)

## 2015-02-27 LAB — BASIC METABOLIC PANEL
Anion gap: 5 (ref 5–15)
BUN: 6 mg/dL (ref 6–20)
CO2: 29 mmol/L (ref 22–32)
CREATININE: 1.07 mg/dL (ref 0.61–1.24)
Calcium: 8.5 mg/dL — ABNORMAL LOW (ref 8.9–10.3)
Chloride: 103 mmol/L (ref 101–111)
Glucose, Bld: 141 mg/dL — ABNORMAL HIGH (ref 65–99)
POTASSIUM: 4.3 mmol/L (ref 3.5–5.1)
SODIUM: 137 mmol/L (ref 135–145)

## 2015-02-27 LAB — BLOOD GAS, ARTERIAL
Acid-Base Excess: 2.3 mmol/L — ABNORMAL HIGH (ref 0.0–2.0)
BICARBONATE: 26.9 meq/L — AB (ref 20.0–24.0)
DRAWN BY: 345601
O2 Content: 2 L/min
O2 SAT: 97.1 %
PH ART: 7.387 (ref 7.350–7.450)
Patient temperature: 98.6
TCO2: 28.3 mmol/L (ref 0–100)
pCO2 arterial: 45.7 mmHg — ABNORMAL HIGH (ref 35.0–45.0)
pO2, Arterial: 88.9 mmHg (ref 80.0–100.0)

## 2015-02-27 LAB — CBC
HCT: 37.7 % — ABNORMAL LOW (ref 39.0–52.0)
HEMOGLOBIN: 12.7 g/dL — AB (ref 13.0–17.0)
MCH: 30.4 pg (ref 26.0–34.0)
MCHC: 33.7 g/dL (ref 30.0–36.0)
MCV: 90.2 fL (ref 78.0–100.0)
PLATELETS: 202 10*3/uL (ref 150–400)
RBC: 4.18 MIL/uL — AB (ref 4.22–5.81)
RDW: 12.8 % (ref 11.5–15.5)
WBC: 8.1 10*3/uL (ref 4.0–10.5)

## 2015-02-27 MED ORDER — INSULIN ASPART 100 UNIT/ML ~~LOC~~ SOLN: 0.0000 [IU] | Freq: Every day | SUBCUTANEOUS | Status: DC

## 2015-02-27 MED ORDER — KCL IN DEXTROSE-NACL 20-5-0.2 MEQ/L-%-% IV SOLN
INTRAVENOUS | Status: DC
Start: 2015-02-27 — End: 2015-03-01
  Filled 2015-02-27: qty 1000

## 2015-02-27 MED ORDER — INSULIN ASPART 100 UNIT/ML ~~LOC~~ SOLN
0.0000 [IU] | Freq: Three times a day (TID) | SUBCUTANEOUS | Status: DC
Start: 2015-02-27 — End: 2015-02-28

## 2015-02-27 NOTE — Progress Notes (Signed)
02/27/2015 1030 Received pt to room 2W21 from 2S.  Pt is A&O, c/o some nausea, will medicate.  Tele monitor applied and CCMD notified.  Oriented to room, call light and bed.  Call bell in reach, family at bedside. Carney Corners

## 2015-02-27 NOTE — Care Management Note (Signed)
Case Management Note Marvetta Gibbons RN, BSN Unit 2W-Case Manager [phone]  Patient Details  Name: Nicholas Daniels MRN: [id number] Date of Birth: 12-13-1950  Subjective/Objective:    Pt admitted s/p STERNOTOMY  And RESECTION OF MEDIASTINAL MASS                 Action/Plan: PTA Pt lived at home with spouse- anticipate return to home- spoke with pt and spouse at bedside regarding d/c plans- per conversation pt ambulating on own, will have sternal precautions at discharge- wife is legally blind and has some physical limitations also- will be at home with pt and can offer basic assistance with needs (cooking, etc) however is limited with any kind of physical assistance. Pt and wife feel like pt doing well and should be able to return home with wife- will continue to assess as pt progresses any needs for [facility].    Expected Discharge Date:                  Expected Discharge Plan:  Home/Self Care  In-House Referral:     Discharge planning Services  CM Consult  Post Acute Care Choice:  NA Choice offered to:  NA  DME Arranged:    DME Agency:     HH Arranged:    HH Agency:     Status of Service:  In process, will continue to follow  Medicare Important Message Given:    Date Medicare IM Given:    Medicare IM give by:    Date Additional Medicare IM Given:    Additional Medicare Important Message give by:     If discussed at [place] of Stay Meetings, dates discussed:    Additional Comments:  Dawayne Patricia, RN 02/27/2015, 2:48 PM

## 2015-02-27 NOTE — Progress Notes (Signed)
Patient tx to 2W21, belongings at bedside with family, SCDs at bedside, patient placed on 2W21 telemetry, patient resting in chair with call bell within reach, receiving RN at bedside, no further questions from receiving RN or patient.  Rowe Pavy, RN

## 2015-02-27 NOTE — Progress Notes (Signed)
1 Day Post-Op Procedure(s) (LRB): STERNOTOMY (N/A) RESECTION OF MEDIASTINAL MASS (N/A) Subjective: Had a stable night- now on room air No air leak, CXR stable Objective: Vital signs in last 24 hours: Temp:  [97.8 F (36.6 C)-98.8 F (37.1 C)] 98 F (36.7 C) (11/22 0340) Pulse Rate:  [72-99] 88 (11/22 0700) Cardiac Rhythm:  [-] Normal sinus rhythm (11/21 2000) Resp:  [10-26] 14 (11/22 0700) BP: (94-162)/(59-103) 94/60 mmHg (11/22 0700) SpO2:  [90 %-100 %] 91 % (11/22 0700) Arterial Line BP: (175-201)/(52-79) 175/55 mmHg (11/21 1045) Weight:  [193 lb 12.8 oz (87.907 kg)] 193 lb 12.8 oz (87.907 kg) (11/22 0600)  Hemodynamic parameters for last 24 hours:  stable  Intake/Output from previous day: 11/21 0701 - 11/22 0700 In: 3600 [P.O.:560; I.V.:2940; IV Piggyback:100] Out: 2910 [Urine:2710; Blood:50; Chest Tube:150] Intake/Output this shift:       lungs clear  nsr abd soft Extremities warm    Recent Labs  02/27/15 0400  WBC 8.1  HGB 12.7*  HCT 37.7*  PLT 202   BMET:  Recent Labs  02/27/15 0400  NA 137  K 4.3  CL 103  CO2 29  GLUCOSE 141*  BUN 6  CREATININE 1.07  CALCIUM 8.5*    PT/INR: No results for input(s): LABPROT, INR in the last 72 hours. ABG    Component Value Date/Time   PHART 7.387 02/27/2015 0358   HCO3 26.9* 02/27/2015 0358   TCO2 28.3 02/27/2015 0358   ACIDBASEDEF 0.2 02/26/2015 1030   O2SAT 97.1 02/27/2015 0358   CBG (last 3)   Recent Labs  02/26/15 1240 02/26/15 2343 02/27/15 0551  GLUCAP 104* 95 117*    Assessment/Plan: S/P Procedure(s) (LRB): STERNOTOMY (N/A) RESECTION OF MEDIASTINAL MASS (N/A) DC tubes DM contol SSI ac/hs Transfer to stepdown  LOS: 1 day    Tharon Aquas Trigt III 02/27/2015

## 2015-02-27 NOTE — Progress Notes (Signed)
02/27/2015 2:57 PM Pt ambulated 925ft with rolling walker on RA.  Pt tolerated well.   Carney Corners

## 2015-02-28 ENCOUNTER — Inpatient Hospital Stay (HOSPITAL_COMMUNITY): Payer: PPO

## 2015-02-28 LAB — CBC
HEMATOCRIT: 35.6 % — AB (ref 39.0–52.0)
HEMOGLOBIN: 12.1 g/dL — AB (ref 13.0–17.0)
MCH: 30.9 pg (ref 26.0–34.0)
MCHC: 34 g/dL (ref 30.0–36.0)
MCV: 90.8 fL (ref 78.0–100.0)
Platelets: 196 10*3/uL (ref 150–400)
RBC: 3.92 MIL/uL — ABNORMAL LOW (ref 4.22–5.81)
RDW: 12.9 % (ref 11.5–15.5)
WBC: 8.4 10*3/uL (ref 4.0–10.5)

## 2015-02-28 LAB — TYPE AND SCREEN
ABO/RH(D): A POS
Antibody Screen: NEGATIVE
Unit division: 0
Unit division: 0

## 2015-02-28 LAB — COMPREHENSIVE METABOLIC PANEL
ALK PHOS: 22 U/L — AB (ref 38–126)
ALT: 72 U/L — ABNORMAL HIGH (ref 17–63)
ANION GAP: 5 (ref 5–15)
AST: 38 U/L (ref 15–41)
Albumin: 2.7 g/dL — ABNORMAL LOW (ref 3.5–5.0)
BILIRUBIN TOTAL: 1.9 mg/dL — AB (ref 0.3–1.2)
BUN: 9 mg/dL (ref 6–20)
CALCIUM: 8.7 mg/dL — AB (ref 8.9–10.3)
CO2: 29 mmol/L (ref 22–32)
Chloride: 102 mmol/L (ref 101–111)
Creatinine, Ser: 1.07 mg/dL (ref 0.61–1.24)
GFR calc non Af Amer: >60 mL/min (ref >60)
GLUCOSE: 139 mg/dL — AB (ref 65–99)
Potassium: 4.2 mmol/L (ref 3.5–5.1)
Sodium: 136 mmol/L (ref 135–145)
TOTAL PROTEIN: 5.3 g/dL — AB (ref 6.5–8.1)

## 2015-02-28 LAB — GLUCOSE, CAPILLARY: Glucose-Capillary: 96 mg/dL (ref 65–99)

## 2015-02-28 MED ORDER — DILTIAZEM HCL ER 120 MG PO CP24
120.0000 mg | ORAL_CAPSULE | Freq: Every day | ORAL | Status: DC
  Administered 2015-02-28 – 2015-03-01 (×2): 120 mg via ORAL
  Filled 2015-02-28 (×3): qty 1

## 2015-02-28 NOTE — Discharge Summary (Signed)
Physician Discharge Summary       [address]       [place] [address]             [phone]    Patient ID: Nicholas Daniels MRN: [id number] DOB/AGE: [birth year] 64 y.o.  Admit date: 02/26/2015 Discharge date: 03/01/2015  Principle Diagnosis: 1. Mediastinal mass  Additional Diagnoses:  1. History of hypertension 2. History of prostate cancer 3. History of glaucoma  Procedure (s):  Median sternotomy, resection of anterior mediastinal mass by Dr. Prescott Gum on 02/26/2015.  Pathology:Mediastinum, mass resection - BENIGN FIBROADIPOSE TISSUE WITH DYSTROPHIC CALCIFICATIONS AND FAT NECROSIS. - THERE IS NO EVIDENCE OF MALIGNANCY. - SEE COMMENT. Microscopic Comment The specimen consists of a 4.5 cm cystic lesion with central necrosis and calcified walls. Histological evaluation reveals dense fibrosis with calcifications, mild inflammation and focal fat necrosis. A CD138 stain highlights the presence of plasma cells, and kappa and lambda staining shows the plasma cells to be polyclonal. 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  History of Presenting Illness: This is a 64 year old Caucasian male with a 2.5 cm partially calcified anterior mediastinal mass. 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 There has been no change in the tumor by scans over the past 6 months. He is asymptomatic. This was an incidental finding when the patient underwent total body scans for history of advanced stage prostate cancer. He is a nonsmoker.  Patient has a strong family history of coronary artery disease with high incidence of myocardial infarction and most members of his family.  Dr. Prescott Gum discussed the need for median sternotomy for resection of anterior mediastinal mass. Potential risks, benefits, and complications were discussed with the patient and he agreed to proceed with  surgery. He was admitted to [facility] on 11/21 in order to undergo resection of mediastinal mass.  Brief Hospital Course:  He remained afebrile and hemodynamically stable. Gordy Councilman, a line, chest tubes, and foley were removed early in the post operative course.Cardizem was restarted as he had taken pre operatively.  He had ABL anemia. He did not require a post op transfusion. His last H and H was 12.1 and 35.6. The patient was felt surgically stable for transfer from the ICU to 2000 for further convalescence on 02/27/2015.He was ambulating on room air. He has been tolerating a diet and has had a bowel movement. chest tube sutures will be removed in the office after discharge.  The patient is felt surgically stable for discharge today.   Latest Vital Signs: Blood pressure 146/79, pulse 88, temperature 97.5 F (36.4 C), temperature source Oral, resp. rate 18, height 5\' 7"  (1.702 m), weight 193 lb 12.8 oz (87.907 kg), SpO2 95 %.  Physical Exam: Cardiovascular: Tachycardic Pulmonary: Clear to auscultation bilaterally; no rales, wheezes, or rhonchi. Abdomen: Soft, non tender, bowel sounds present.. Wounds: Aquacel intact.   Discharge Condition:Stable and discharged to home  Recent laboratory studies:  Lab Results  Component Value Date   WBC 8.4 02/28/2015   HGB 12.1* 02/28/2015   HCT 35.6* 02/28/2015   MCV 90.8 02/28/2015   PLT 196 02/28/2015   Lab Results  Component Value Date   NA 136 02/28/2015   K 4.2 02/28/2015   CL 102 02/28/2015   CO2 29 02/28/2015   CREATININE 1.07 02/28/2015   GLUCOSE 139* 02/28/2015   Diagnostic Studies:  Ct Chest W Contrast  02/14/2015  CLINICAL DATA:  Followup of mediastinal mass, history of prostate carcinoma diagnosed 4 years ago with surgery EXAM: CT CHEST WITH CONTRAST TECHNIQUE: Multidetector CT imaging of the chest was performed during intravenous contrast administration. CONTRAST:  75 cc Isovue-300 COMPARISON:  CT chest of 09/05/2014 and  chest x-ray of 12/10/2007 FINDINGS: On lung window images, the peripherally calcified anterior meter step time mass to the right of midline is again noted measuring 2.9 x 2.7 cm. This is unchanged compared to the CT cardiac exam of 09/05/2014. In review of the lateral chest x-ray from 2000 none I believe this anterior mediastinal mass was probably present and is unchanged most consistent with a benign process. Otherwise no lung parenchymal abnormality is seen. No infiltrate or effusion is noted. The central airway is patent. On soft tissue window images, the thyroid gland is unremarkable. The thoracic aorta opacifies with no significant abnormality noted. The pulmonary arteries also are well opacified with no abnormality evident. No mediastinal or hilar adenopathy is seen. No pericardial effusion is noted. Surgical clips are noted from prior cholecystectomy. IMPRESSION: 1. Stable peripherally calcified anterior mediastinal mass to the right of midline of 2.9 x 2.7 cm. The lateral chest x-ray from 2009 does appear to show a partially calcified anterior mediastinal mass, and therefore this would be consistent with a benign process. However follow-up is recommended to assess stability preferably with MR. 2. No parenchymal infiltrate or effusion is seen. Electronically Signed   By: Ivar Drape M.D.   On: 02/14/2015 09:48   Dg Chest Port 1 View  02/28/2015  CLINICAL DATA:  Shortness of breath. EXAM: PORTABLE CHEST 1 VIEW COMPARISON:  02/27/2015. FINDINGS: Interim removal of right chest tube. Right IJ line in stable position. Prior median sternotomy. Cardiomegaly with normal pulmonary vascularity. Low lung volumes with bibasilar atelectasis. Mild infiltrate left lung base cannot be excluded. Tiny left pleural effusion cannot be excluded. No pneumothorax. IMPRESSION: 1. Right IJ line in stable position. 2. Prior median sternotomy.  Stable cardiomegaly. 3. Low lung volumes with mild bibasilar atelectasis. A mild  infiltrate left lung base cannot be excluded. Tiny left pleural effusion cannot be excluded . Electronically Signed   By: Marcello Moores  Register   On: 02/28/2015 07:40   Discharge Medications:   Medication List    ASK your doctor about these medications        aspirin 325 MG tablet  Take 325 mg by mouth daily.     CARTIA XT 120 MG 24 hr capsule  Generic drug:  diltiazem  Take 120 mg by mouth daily.     FLAXSEED OIL PO  Take 1,200 mg by mouth. Take 2 pills daily     latanoprost 0.005 % ophthalmic solution  Commonly known as:  XALATAN  Place 1 drop into both eyes at bedtime.     multivitamin tablet  Take 1 tablet by mouth daily.     niacin 500 MG tablet  Commonly known as:  SLO-NIACIN  Take 500 mg by mouth at bedtime.     PRESCRIPTION MEDICATION  1 application by Intracavernosal route daily as needed (for erectile dysfunction). PGE-Inject 20 mcg prn     sildenafil 20 MG tablet  Commonly known as:  REVATIO  Take 20 mg by mouth daily as needed (for erectile dysfunction).     timolol 0.5 % ophthalmic solution  Commonly known as:  TIMOPTIC  Place 1 drop into both eyes daily.  Follow Up Appointments: Follow-up Information    Follow up with Ivin Poot III, MD On 04/04/2015.   Specialty:  Cardiothoracic Surgery   Why:  PA/LAT CXR (to be taken at [facility] which is in the same building as Dr. Lucianne Lei Trigt's office) on 04/04/2015 at 10:45 am; Appointment time is at 11:30 am   Contact information:   [address] [place] [address] [phone]       Follow up with Nurse On 03/06/2015.   Why:  Appointment time is 10:30 am and is for chest tube suture removal only   Contact information:   [place] [address] [phone]      Signed: Cinda Quest 03/01/2015, 8:12 AM

## 2015-02-28 NOTE — Discharge Instructions (Signed)
Activity: 1.May walk up steps                2.No lifting more than ten pounds for six weeks.                 3.No driving for four weeks.                4.Stop any activity that causes chest pain, shortness of breath, dizziness, sweating or excessive weakness.                5.Avoid straining.                6.Continue with your breathing exercises daily.  Diet: Low fat, Low salt diet  Wound Care: May shower.  Clean wounds with mild soap and water daily. Contact the office at [phone] if any problems arise.

## 2015-02-28 NOTE — Progress Notes (Addendum)
      [address]       ,[place] [address]             [phone]        2 Days Post-Op Procedure(s) (LRB): STERNOTOMY (N/A) RESECTION OF MEDIASTINAL MASS (N/A)  Subjective: Patient with nausea this am. He denies abdominal pain.  Objective: Vital signs in last 24 hours: Temp:  [97.5 F (36.4 C)-99 F (37.2 C)] 99 F (37.2 C) (11/23 0520) Pulse Rate:  [74-102] 102 (11/23 0520) Cardiac Rhythm:  [-] Normal sinus rhythm (11/22 2316) Resp:  [11-18] 16 (11/23 0520) BP: (96-131)/(50-72) 129/65 mmHg (11/23 0520) SpO2:  [92 %-93 %] 92 % (11/23 0520)   Current Weight  02/27/15 193 lb 12.8 oz (87.907 kg)      Intake/Output from previous day: 11/22 0701 - 11/23 0700 In: 237.5 [P.O.:120; I.V.:117.5] Out: 625 [Urine:575; Chest Tube:50]   Physical Exam:  Cardiovascular: Tachycardic Pulmonary: Clear to auscultation bilaterally; no rales, wheezes, or rhonchi. Abdomen: Soft, non tender, bowel sounds present.. Wounds: Aquacel intact.    Lab Results: CBC: Recent Labs  02/27/15 0400 02/28/15 0236  WBC 8.1 8.4  HGB 12.7* 12.1*  HCT 37.7* 35.6*  PLT 202 196   BMET:  Recent Labs  02/27/15 0400 02/28/15 0236  NA 137 136  K 4.3 4.2  CL 103 102  CO2 29 29  GLUCOSE 141* 139*  BUN 6 9  CREATININE 1.07 1.07  CALCIUM 8.5* 8.7*    PT/INR:  Lab Results  Component Value Date   INR 1.07 02/22/2015   ABG:  INR: Will add last result for INR, ABG once components are confirmed Will add last 4 CBG results once components are confirmed  Assessment/Plan:  1. CV - ST in the low 100's. On Cardizem CD 120 mg daily.  2.  Pulmonary - On room air. CXR appears to show cardiomegaly, bibasilar atelectasis, no pneumothorax, and small bilateral pleural effusions.Encourage incentive spirometer 3.  Acute blood loss anemia - H and H stable at 12.1 and 35.6 4. CBGs 114/86/96. No history of diabetes. Stop accu checks and SS PRN. 5. Await pathology results. 6. Remove  central line 7. Possibly discharge ln am  ZIMMERMAN,DONIELLE MPA-C 02/28/2015,7:36 AM   patient examined and medical record reviewed,agree with above note. Tharon Aquas Trigt III 02/28/2015

## 2015-03-01 ENCOUNTER — Inpatient Hospital Stay (HOSPITAL_COMMUNITY): Payer: PPO

## 2015-03-01 MED ORDER — OXYCODONE HCL 5 MG PO TABS: 5.0000 mg | ORAL_TABLET | ORAL | Status: DC | PRN

## 2015-03-01 MED ORDER — TRAMADOL HCL 50 MG PO TABS: 50.0000 mg | ORAL_TABLET | Freq: Four times a day (QID) | ORAL | Status: DC | PRN

## 2015-03-01 MED ORDER — ACETAMINOPHEN 500 MG PO TABS: 1000.0000 mg | ORAL_TABLET | Freq: Four times a day (QID) | ORAL | Status: DC | PRN

## 2015-03-01 NOTE — Progress Notes (Signed)
Pt ambulated 550 ft with rolling walker. Pt tolerated well. No complaints of pain or SOB. Pt resting comfortably.  Raliegh Ip RN

## 2015-03-01 NOTE — Progress Notes (Addendum)
      [address]       [place] [address]             [phone]      3 Days Post-Op Procedure(s) (LRB): STERNOTOMY (N/A) RESECTION OF MEDIASTINAL MASS (N/A)   Subjective:  Nicholas Daniels has no complaints.  He states he feels great and is ready to go home.  + ambulation  + BM  Objective: Vital signs in last 24 hours: Temp:  [97.5 F (36.4 C)-98.4 F (36.9 C)] 97.5 F (36.4 C) (11/24 0546) Pulse Rate:  [79-102] 88 (11/24 0546) Cardiac Rhythm:  [-] Normal sinus rhythm (11/23 1900) Resp:  [18] 18 (11/24 0546) BP: (131-148)/(64-79) 146/79 mmHg (11/24 0546) SpO2:  [94 %-98 %] 95 % (11/24 0546)  Intake/Output from previous day: 11/23 0701 - 11/24 0700 In: 600 [P.O.:600] Out: -   General appearance: alert, cooperative and no distress Heart: regular rate and rhythm Lungs: clear to auscultation bilaterally Abdomen: soft, non-tender; bowel sounds normal; no masses,  no organomegaly Extremities: edema none  Wound: clean and dry  Lab Results:  Recent Labs  02/27/15 0400 02/28/15 0236  WBC 8.1 8.4  HGB 12.7* 12.1*  HCT 37.7* 35.6*  PLT 202 196   BMET:  Recent Labs  02/27/15 0400 02/28/15 0236  NA 137 136  K 4.3 4.2  CL 103 102  CO2 29 29  GLUCOSE 141* 139*  BUN 6 9  CREATININE 1.07 1.07  CALCIUM 8.5* 8.7*    PT/INR: No results for input(s): LABPROT, INR in the last 72 hours. ABG    Component Value Date/Time   PHART 7.387 02/27/2015 0358   HCO3 26.9* 02/27/2015 0358   TCO2 28.3 02/27/2015 0358   ACIDBASEDEF 0.2 02/26/2015 1030   O2SAT 97.1 02/27/2015 0358   CBG (last 3)   Recent Labs  02/27/15 1636 02/27/15 2152 02/28/15 0556  GLUCAP 114* 86 96    Assessment/Plan: S/P Procedure(s) (LRB): STERNOTOMY (N/A) RESECTION OF MEDIASTINAL MASS (N/A)  1. CV- NSR, pressure controlled- continue Cardizem 2. Pulm- no acute issues, CXR free from pneumothorax, significant effusion, continue IS 3. 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 Dispo- patient doing well, will d/c home today    LOS: 3 days    BARRETT, ERIN 03/01/2015  Patient doing well, wound ok Plan d/c today I have seen and examined Nicholas Daniels and agree with the above assessment  and plan.  Grace Isaac MD [facility] [phone] Office [phone] 03/01/2015 10:09 AM

## 2015-03-06 ENCOUNTER — Encounter (INDEPENDENT_AMBULATORY_CARE_PROVIDER_SITE_OTHER): Payer: Self-pay | Admitting: *Deleted

## 2015-03-06 DIAGNOSIS — R222 Localized swelling, mass and lump, trunk: Secondary | ICD-10-CM

## 2015-03-09 ENCOUNTER — Encounter: Payer: Self-pay | Admitting: Internal Medicine

## 2015-04-04 ENCOUNTER — Ambulatory Visit: Payer: Self-pay | Admitting: Cardiothoracic Surgery

## 2015-04-04 ENCOUNTER — Other Ambulatory Visit: Payer: Self-pay | Admitting: Cardiothoracic Surgery

## 2015-04-04 DIAGNOSIS — J9859 Other diseases of mediastinum, not elsewhere classified: Secondary | ICD-10-CM

## 2015-04-05 ENCOUNTER — Ambulatory Visit
Admission: RE | Admit: 2015-04-05 | Discharge: 2015-04-05 | Disposition: A | Discharge status: Home / Self Care | Payer: PPO | Source: Ambulatory Visit | Attending: Cardiothoracic Surgery | Admitting: Cardiothoracic Surgery

## 2015-04-05 ENCOUNTER — Encounter: Payer: Self-pay | Admitting: Cardiothoracic Surgery

## 2015-04-05 ENCOUNTER — Ambulatory Visit (INDEPENDENT_AMBULATORY_CARE_PROVIDER_SITE_OTHER): Payer: Self-pay | Admitting: Cardiothoracic Surgery

## 2015-04-05 VITALS — BP 125/84 | HR 74 | Resp 20 | Ht 67.0 in | Wt 193.0 lb

## 2015-04-05 DIAGNOSIS — Z8546 Personal history of malignant neoplasm of prostate: Secondary | ICD-10-CM

## 2015-04-05 DIAGNOSIS — J9859 Other diseases of mediastinum, not elsewhere classified: Secondary | ICD-10-CM

## 2015-04-05 NOTE — Progress Notes (Signed)
PCP is [name], MD Referring Provider is Jacolyn Reedy, MD  Chief Complaint  Patient presents with  . Routine Post Op    f/ui from surgery with CXR s/p Median sternotomy, resection of anterior mediastinal mass.02/26/15    [id number] one month postop office visit. 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 The patient is doing well. The incision is well and healing.he still has some mild soreness in his chest but not needing narcotics. No shortness of breath cough. No popping or clicking in the chest incision. Chest x-ray shows clear lung fields, no pleural effusion, sternal wires intact.  Past Medical History  Diagnosis Date  . Hypertension   . Glaucoma   . Prostate cancer ([place]) 2009  . Cough   . PFO (patent foramen ovale)     PFO noted on coronary CT 09/05/14    Past Surgical History  Procedure Laterality Date  . Prostate surgery  2009  . Cholecystectomy    . Radiation treatment    . Sternotomy N/A 02/26/2015    Procedure: STERNOTOMY;  Surgeon: Ivin Poot, MD;  Location: [facility] OR;  Service: Thoracic;  Laterality: N/A;  . Resection of mediastinal mass N/A 02/26/2015    Procedure: RESECTION OF MEDIASTINAL MASS;  Surgeon: Ivin Poot, MD;  Location: [facility] OR;  Service: Thoracic;  Laterality: N/A;    Family History  Problem Relation Age of Onset  . Heart disease Brother   . Liver cancer Brother     Social History Social History  Substance Use Topics  . Smoking status: Never Smoker   . Smokeless tobacco: Never Used  . Alcohol Use: No    Current Outpatient Prescriptions  Medication Sig Dispense Refill  . acetaminophen (TYLENOL) 500 MG tablet Take 2 tablets (1,000 mg total) by mouth every 6 (six) hours as needed for mild pain or fever. 30 tablet 0  . aspirin 325 MG tablet Take 325 mg by mouth daily.    Marland Kitchen diltiazem  (CARTIA XT) 120 MG 24 hr capsule Take 120 mg by mouth daily.    . Flaxseed, Linseed, (FLAXSEED OIL PO) Take 2,800 mg by mouth daily.    Marland Kitchen latanoprost (XALATAN) 0.005 % ophthalmic solution Place 1 drop into both eyes at bedtime.    . Multiple Vitamin (MULTIVITAMIN) tablet Take 1 tablet by mouth daily.    . niacin (SLO-NIACIN) 500 MG tablet Take 500 mg by mouth at bedtime.    Marland Kitchen oxyCODONE (OXY IR/ROXICODONE) 5 MG immediate release tablet Take 1-2 tablets (5-10 mg total) by mouth every 4 (four) hours as needed for severe pain. 30 tablet 0  . PRESCRIPTION MEDICATION 1 application by Intracavernosal route daily as needed (for erectile dysfunction). PGE-Inject 20 mcg prn    . sildenafil (REVATIO) 20 MG tablet Take 20 mg by mouth daily as needed (for erectile dysfunction).     . timolol (TIMOPTIC) 0.5 % ophthalmic solution Place 1 drop into both eyes daily.    . traMADol (ULTRAM) 50 MG tablet Take 1-2 tablets (50-100 mg total) by mouth every 6 (six) hours as needed (mild pain). 30 tablet 0   No current facility-administered medications for this visit.    No Known Allergies  Review of Systems   Gaining strength Trying not to lift or pull with his upper extremities Improved appetite No shortness of breath No fever Weight stable  BP 125/84 mmHg  Pulse 74  Resp 20  Ht 5\' 7"  (1.702 m)  Wt 193 lb (87.544 kg)  BMI 30.22 kg/m2  SpO2 98% Physical Exam Alert and comfortable Lungs clear Heart rate regular Sternum well-healed incision Neuro intact  Diagnostic Tests: Chest x-ray personally reviewed, clear  Impression: doing well one month after sternotomy for resection of large thymic cyst. Pathology report reviewed with patient and family.  Plan: He can lift up to 20 pounds until mid February. He can drive. No prescriptions provided at this office visit. He'll return for final review in 2 months. He should not return to work until his final review. Len Childs, MD [facility] [phone]

## 2015-04-10 DIAGNOSIS — H40053 Ocular hypertension, bilateral: Secondary | ICD-10-CM | POA: Diagnosis not present

## 2015-04-10 DIAGNOSIS — H40033 Anatomical narrow angle, bilateral: Secondary | ICD-10-CM | POA: Diagnosis not present

## 2015-04-10 DIAGNOSIS — H2513 Age-related nuclear cataract, bilateral: Secondary | ICD-10-CM | POA: Diagnosis not present

## 2015-04-20 DIAGNOSIS — L570 Actinic keratosis: Secondary | ICD-10-CM | POA: Diagnosis not present

## 2015-04-20 DIAGNOSIS — D485 Neoplasm of uncertain behavior of skin: Secondary | ICD-10-CM | POA: Diagnosis not present

## 2015-04-20 DIAGNOSIS — D2261 Melanocytic nevi of right upper limb, including shoulder: Secondary | ICD-10-CM | POA: Diagnosis not present

## 2015-04-20 DIAGNOSIS — L821 Other seborrheic keratosis: Secondary | ICD-10-CM | POA: Diagnosis not present

## 2015-04-20 DIAGNOSIS — D2262 Melanocytic nevi of left upper limb, including shoulder: Secondary | ICD-10-CM | POA: Diagnosis not present

## 2015-04-20 DIAGNOSIS — L918 Other hypertrophic disorders of the skin: Secondary | ICD-10-CM | POA: Diagnosis not present

## 2015-04-20 DIAGNOSIS — L218 Other seborrheic dermatitis: Secondary | ICD-10-CM | POA: Diagnosis not present

## 2015-04-20 DIAGNOSIS — D225 Melanocytic nevi of trunk: Secondary | ICD-10-CM | POA: Diagnosis not present

## 2015-04-20 DIAGNOSIS — B351 Tinea unguium: Secondary | ICD-10-CM | POA: Diagnosis not present

## 2015-04-20 DIAGNOSIS — D1801 Hemangioma of skin and subcutaneous tissue: Secondary | ICD-10-CM | POA: Diagnosis not present

## 2015-05-04 DIAGNOSIS — C61 Malignant neoplasm of prostate: Secondary | ICD-10-CM | POA: Diagnosis not present

## 2015-05-30 ENCOUNTER — Encounter: Payer: Self-pay | Admitting: Cardiothoracic Surgery

## 2015-05-30 ENCOUNTER — Ambulatory Visit (INDEPENDENT_AMBULATORY_CARE_PROVIDER_SITE_OTHER): Payer: PPO | Admitting: Cardiothoracic Surgery

## 2015-05-30 VITALS — BP 137/83 | HR 67 | Resp 16 | Ht 67.0 in | Wt 186.0 lb

## 2015-05-30 DIAGNOSIS — Z09 Encounter for follow-up examination after completed treatment for conditions other than malignant neoplasm: Secondary | ICD-10-CM

## 2015-05-30 DIAGNOSIS — J9859 Other diseases of mediastinum, not elsewhere classified: Secondary | ICD-10-CM

## 2015-05-30 NOTE — Progress Notes (Signed)
PCP is [name], MD Referring Provider is Jacolyn Reedy, MD  Chief Complaint  Patient presents with  . Routine Post Op    2 month f/u s/p Resection of mediastinal mass 02/26/15    HPI:3 month followup after sternotomy for resection of a large thymoma, benign No problems with chest pain. Sternal incision well-healed. No shortness of breath. The patient will have a followup CT scan approximately  6 months after surgery and at one year for surveillance following resection of the mediastinal tumor.he is a nonsmoker. The patient is walking over a mile daily.  Past Medical History  Diagnosis Date  . Hypertension   . Glaucoma   . Prostate cancer ([place]) 2009  . Cough   . PFO (patent foramen ovale)     PFO noted on coronary CT 09/05/14    Past Surgical History  Procedure Laterality Date  . Prostate surgery  2009  . Cholecystectomy    . Radiation treatment    . Sternotomy N/A 02/26/2015    Procedure: STERNOTOMY;  Surgeon: Ivin Poot, MD;  Location: [facility] OR;  Service: Thoracic;  Laterality: N/A;  . Resection of mediastinal mass N/A 02/26/2015    Procedure: RESECTION OF MEDIASTINAL MASS;  Surgeon: Ivin Poot, MD;  Location: [facility] OR;  Service: Thoracic;  Laterality: N/A;    Family History  Problem Relation Age of Onset  . Heart disease Brother   . Liver cancer Brother     Social History Social History  Substance Use Topics  . Smoking status: Never Smoker   . Smokeless tobacco: Never Used  . Alcohol Use: No    Current Outpatient Prescriptions  Medication Sig Dispense Refill  . acetaminophen (TYLENOL) 500 MG tablet Take 2 tablets (1,000 mg total) by mouth every 6 (six) hours as needed for mild pain or fever. 30 tablet 0  . aspirin 325 MG tablet Take 325 mg by mouth daily.    Marland Kitchen diltiazem (CARTIA XT) 120 MG 24 hr capsule Take 120 mg by mouth daily.    . Flaxseed, Linseed, (FLAXSEED OIL PO) Take 2,800 mg by mouth daily.    Marland Kitchen latanoprost (XALATAN) 0.005 %  ophthalmic solution Place 1 drop into both eyes at bedtime.    . Multiple Vitamin (MULTIVITAMIN) tablet Take 1 tablet by mouth daily.    . niacin (SLO-NIACIN) 500 MG tablet Take 500 mg by mouth at bedtime.    Marland Kitchen PRESCRIPTION MEDICATION 1 application by Intracavernosal route daily as needed (for erectile dysfunction). PGE-Inject 20 mcg prn    . sildenafil (REVATIO) 20 MG tablet Take 20 mg by mouth daily as needed (for erectile dysfunction).     . timolol (TIMOPTIC) 0.5 % ophthalmic solution Place 1 drop into both eyes daily.     No current facility-administered medications for this visit.    No Known Allergies  Review of Systems   Patient has history of prostate cancer with resection 2009 PSA levels are now rising 8.2 Patient apparently lost his job at [employer]  BP 137/83 mmHg  Pulse 67  Resp 16  Ht 5\' 7"  (1.702 m)  Wt 186 lb (84.369 kg)  BMI 29.12 kg/m2  SpO2 97% Physical Exam Alert and comfortable Lungs clear Sternal incision stable well-healed Heart rhythm regular without murmur or gallop Peripheral pulses Neuro intact  Diagnostic Tests: No chest x-ray today  Impression: Stable course almost 3 months after sternotomy and resection of a large thymic tumor-5 cm which was benign  Plan:he will have  a CT scan of the chest approximately 6 months after surgery for surveillance and then again in one year. There are no restrictions on his activities or lifting at this time.   Len Childs, MD [facility] [phone]

## 2015-06-13 DIAGNOSIS — C61 Malignant neoplasm of prostate: Secondary | ICD-10-CM | POA: Diagnosis not present

## 2015-06-28 ENCOUNTER — Other Ambulatory Visit (HOSPITAL_COMMUNITY): Payer: Self-pay | Admitting: Urology

## 2015-06-28 DIAGNOSIS — C61 Malignant neoplasm of prostate: Secondary | ICD-10-CM

## 2015-07-11 DIAGNOSIS — C61 Malignant neoplasm of prostate: Secondary | ICD-10-CM | POA: Diagnosis not present

## 2015-07-13 ENCOUNTER — Encounter (HOSPITAL_COMMUNITY)
Admission: RE | Admit: 2015-07-13 | Discharge: 2015-07-13 | Disposition: A | Discharge status: Home / Self Care | Payer: PPO | Source: Ambulatory Visit | Attending: Urology | Admitting: Urology

## 2015-07-13 ENCOUNTER — Encounter: Payer: Self-pay | Admitting: Cardiovascular Disease

## 2015-07-13 DIAGNOSIS — C61 Malignant neoplasm of prostate: Secondary | ICD-10-CM

## 2015-07-13 MED ORDER — TECHNETIUM TC 99M MEDRONATE IV KIT
25.8000 | PACK | Freq: Once | INTRAVENOUS | Status: AC | PRN
  Administered 2015-07-13: 25.8 via INTRAVENOUS

## 2015-08-07 ENCOUNTER — Encounter: Payer: Self-pay | Admitting: Radiation Oncology

## 2015-08-07 NOTE — Progress Notes (Addendum)
Location of :  For Preventaive  Gynecomastia   To radiate chest area    Past/Anticipated interventions by medical oncology, if any: Chemotherapy :NO   Pain issues, if any:  No but having tingling in right hand,   SAFETY ISSUES:  Prior radiation? Yes, Prostate 6/11/212-10/31/10  Dr. Valere Dross   Pacemaker/ICD? NO   Is the patient on methotrexate?  NO  Current Complaints / other details: Married,  :   Hx Prostate cancer, Sternotomy, resection of mediastinal mass      Rebecca Eaton, RN 08/07/2015,3:01 PM  Embark Study:Phase 3,Randomized,efficacy and safety study of Enzalutamide Plus Leuprolide,Enzalutamide Monotherapy;and Placebo plus leuprolide in Men high risk nonmetastatic  Prostate cancer progressing after definitive therapy. Allergies:NKA BP 157/83 mmHg  Pulse 77  Temp(Src) 97.7 F (36.5 C) (Oral)  Resp 20  Ht 5\' 7"  (1.702 m)  Wt 196 lb 9.6 oz (89.177 kg)  BMI 30.78 kg/m2  SpO2 98%  Wt Readings from Last 3 Encounters:  08/08/15 196 lb 9.6 oz (89.177 kg)  05/30/15 186 lb (84.369 kg)  04/05/15 193 lb (87.544 kg)

## 2015-08-08 ENCOUNTER — Encounter: Payer: Self-pay | Admitting: Radiation Oncology

## 2015-08-08 ENCOUNTER — Ambulatory Visit
Admission: RE | Admit: 2015-08-08 | Discharge: 2015-08-08 | Disposition: A | Discharge status: Home / Self Care | Payer: PPO | Source: Ambulatory Visit | Attending: Radiation Oncology | Admitting: Radiation Oncology

## 2015-08-08 VITALS — BP 157/83 | HR 77 | Temp 97.7°F | Resp 20 | Ht 67.0 in | Wt 196.6 lb

## 2015-08-08 DIAGNOSIS — Z8546 Personal history of malignant neoplasm of prostate: Secondary | ICD-10-CM | POA: Diagnosis not present

## 2015-08-08 DIAGNOSIS — Z510 Encounter for antineoplastic radiation therapy: Secondary | ICD-10-CM | POA: Diagnosis not present

## 2015-08-08 DIAGNOSIS — N62 Hypertrophy of breast: Secondary | ICD-10-CM

## 2015-08-08 DIAGNOSIS — T50905A Adverse effect of unspecified drugs, medicaments and biological substances, initial encounter: Secondary | ICD-10-CM

## 2015-08-08 NOTE — Progress Notes (Signed)
Please see the Nurse Progress Note in the MD Initial Consult Encounter for this patient. 

## 2015-08-09 ENCOUNTER — Ambulatory Visit
Admission: RE | Admit: 2015-08-09 | Discharge: 2015-08-09 | Disposition: A | Discharge status: Home / Self Care | Payer: PPO | Source: Ambulatory Visit | Attending: Radiation Oncology | Admitting: Radiation Oncology

## 2015-08-09 ENCOUNTER — Encounter: Payer: Self-pay | Admitting: Radiation Oncology

## 2015-08-09 DIAGNOSIS — Z8546 Personal history of malignant neoplasm of prostate: Secondary | ICD-10-CM

## 2015-08-09 DIAGNOSIS — Z510 Encounter for antineoplastic radiation therapy: Secondary | ICD-10-CM | POA: Diagnosis not present

## 2015-08-09 DIAGNOSIS — N62 Hypertrophy of breast: Secondary | ICD-10-CM | POA: Diagnosis not present

## 2015-08-09 NOTE — Progress Notes (Signed)
Patient was seen in the back on linac#3 by Dr. Tammi Klippel, not sent to nursing for assessment 5:07 PM

## 2015-08-09 NOTE — Progress Notes (Signed)
  Radiation Oncology         [phone] ________________________________  Name: Nicholas Daniels MRN: [id number]  Date: 08/09/2015  DOB: 30-Jan-1951  Weekly Radiation Therapy Management  Current Dose: 10 Gy     Planned Dose:  10 Gy  Narrative . . . . . . . . The patient presents for the final under treatment assessment.    The patient has had some continuation of previously noted symptoms.                                 Set-up films were reviewed.                                 The chart was checked. Physical Findings. . . Weight essentially stable.  No significant changes. Impression . . . . . . . The patient tolerated radiation relatively well. Plan . . . . . . . . . . . . Complete radiation today as scheduled, and follow-up in one month. The patient was encouraged to call or return to the clinic in the interim for any worsening symptoms.  ________________________________  Sheral Apley Tammi Klippel, M.D.  This document serves as a record of services personally performed by Tyler Pita, MD. It was created on his behalf by Derek Mound, a trained medical scribe. The creation of this record is based on the scribe's personal observations and the provider's statements to them. This document has been checked and approved by the attending provider.

## 2015-08-10 ENCOUNTER — Telehealth: Payer: Self-pay | Admitting: Radiation Oncology

## 2015-08-10 ENCOUNTER — Telehealth: Payer: Self-pay | Admitting: *Deleted

## 2015-08-10 DIAGNOSIS — C61 Malignant neoplasm of prostate: Secondary | ICD-10-CM | POA: Diagnosis not present

## 2015-08-10 DIAGNOSIS — G589 Mononeuropathy, unspecified: Secondary | ICD-10-CM | POA: Diagnosis not present

## 2015-08-10 DIAGNOSIS — Z6829 Body mass index (BMI) 29.0-29.9, adult: Secondary | ICD-10-CM | POA: Diagnosis not present

## 2015-08-10 DIAGNOSIS — M898X1 Other specified disorders of bone, shoulder: Secondary | ICD-10-CM | POA: Diagnosis not present

## 2015-08-10 NOTE — Telephone Encounter (Signed)
Returned call to dental works at [facility] vm to brittany,patient was treated on his bchest area  Is clear for dental xrays and cleaning per our PA Shona Simpson, 2:42 PM

## 2015-08-10 NOTE — Telephone Encounter (Signed)
Patients wife called back , i confirmed appointment, but she needed to move it to the following day, appointment is now set and patient has confirmed

## 2015-08-10 NOTE — Telephone Encounter (Signed)
CALLED PATIENT TO INFORM OF FU WITH ALSON PERKINS ON 09-18-15 @ 2:45 PM, LVM FOR A RETURN CALL

## 2015-08-13 ENCOUNTER — Telehealth: Payer: Self-pay | Admitting: *Deleted

## 2015-08-13 ENCOUNTER — Encounter: Payer: Self-pay | Admitting: Radiation Oncology

## 2015-08-13 NOTE — Telephone Encounter (Signed)
Dental works called requesting written permission or clearance  so that patient can have dental xrays and dental work, can fax to [phone], will forward this to Dr. Lisbeth Renshaw and Shona Simpson, PA-C, they did get the phone message saying he could have xrays and dental work, we treated him for gynecomastia radiation x 1 1:33 PM

## 2015-08-13 NOTE — Telephone Encounter (Signed)
Called and left voice message that letter of clearance for x-rays and dental work  To be faxed to their office today by Luanne Bras can call if fax doesn't get through at [phone] 3:10 PM

## 2015-08-14 ENCOUNTER — Encounter: Payer: Self-pay | Admitting: Radiation Oncology

## 2015-08-14 DIAGNOSIS — N62 Hypertrophy of breast: Secondary | ICD-10-CM | POA: Insufficient documentation

## 2015-08-14 DIAGNOSIS — T50905A Adverse effect of unspecified drugs, medicaments and biological substances, initial encounter: Secondary | ICD-10-CM | POA: Insufficient documentation

## 2015-08-14 NOTE — Progress Notes (Signed)
Radiation Oncology         [phone] ________________________________  Name: Nicholas Daniels MRN: [id number]  Date: 08/08/2015  DOB: July 23, 1950  [id number] Nicholas Level, MD  Rana Snare, MD     REFERRING PHYSICIAN: Rana Snare, MD   DIAGNOSIS: The primary encounter diagnosis was Gynecomastia. A diagnosis of Drug-induced gynecomastia was also pertinent to this visit.   HISTORY OF PRESENT ILLNESS::Nicholas Daniels is a 65 y.o. male who is seen for an initial consultation visit regarding the patient's diagnosis of prostate cancer.  The patient will proceed with further treatment at this time and the patient has enrolled on the Embark clinical trial. The patient is status post prostatectomy and subsequently underwent salvage radiation treatment in 2012. Unfortunately the patient has had a further increase in his PSA Daniels at this time. He therefore has enrolled in the above study. Based on his randomization, the patient is appropriate for radiation treatment to the chest for prevention of gynecomastia.  The patient overall states that he feels well at this time. No other history of radiation treatment.    PREVIOUS RADIATION THERAPY: Yes as above salvage radiation treatment to the prostatic fossa in 2012.  No other history of radiation treatment   PAST MEDICAL HISTORY:  has a past medical history of Hypertension; Glaucoma; Cough; PFO (patent foramen ovale); and Prostate cancer ([place]) (2009).     PAST SURGICAL HISTORY: Past Surgical History  Procedure Laterality Date  . Prostate surgery  2009  . Cholecystectomy    . Radiation treatment    . Sternotomy N/A 02/26/2015    Procedure: STERNOTOMY;  Surgeon: Ivin Poot, MD;  Location: [facility];  Service: Thoracic;  Laterality: N/A;  . Resection of mediastinal mass N/A 02/26/2015    Procedure: RESECTION OF MEDIASTINAL MASS;  Surgeon: Ivin Poot, MD;  Location: [facility] OR;  Service: Thoracic;  Laterality: N/A;     FAMILY HISTORY: family  history includes COPD in his brother, father, and mother; Cancer in his brother; Diabetes in his mother; Heart disease in his brother and mother; Liver cancer in his brother; Pneumonia in his mother.   SOCIAL HISTORY:  reports that he has never smoked. He has never used smokeless tobacco. He reports that he does not drink alcohol or use illicit drugs.   ALLERGIES: Review of patient's allergies indicates no known allergies.   MEDICATIONS:  Current Outpatient Prescriptions  Medication Sig Dispense Refill  . acetaminophen (TYLENOL) 500 MG tablet Take 2 tablets (1,000 mg total) by mouth every 6 (six) hours as needed for mild pain or fever. 30 tablet 0  . aspirin 325 MG tablet Take 325 mg by mouth daily.    Marland Kitchen diltiazem (CARTIA XT) 120 MG 24 hr capsule Take 120 mg by mouth daily.    . Flaxseed, Linseed, (FLAXSEED OIL PO) Take 2,800 mg by mouth daily.    Marland Kitchen latanoprost (XALATAN) 0.005 % ophthalmic solution Place 1 drop into both eyes at bedtime.    . Multiple Vitamin (MULTIVITAMIN) tablet Take 1 tablet by mouth daily.    . sildenafil (REVATIO) 20 MG tablet Take 20 mg by mouth daily as needed (for erectile dysfunction).     . timolol (TIMOPTIC) 0.5 % ophthalmic solution Place 1 drop into both eyes daily.    . niacin (SLO-NIACIN) 500 MG tablet Take 500 mg by mouth at bedtime.    Marland Kitchen PRESCRIPTION MEDICATION 1 application by Intracavernosal route daily as needed (for erectile dysfunction). Reported on 08/08/2015  No current facility-administered medications for this encounter.     REVIEW OF SYSTEMS:  A 15 point review of systems is documented in the electronic medical record. This was obtained by the nursing staff. However, I reviewed this with the patient to discuss relevant findings and make appropriate changes.  Pertinent items are noted in HPI.    PHYSICAL EXAM:  height is 5\' 7"  (1.702 m) and weight is 196 lb 9.6 oz (89.177 kg). His oral temperature is 97.7 F (36.5 C). His blood pressure is  157/83 and his pulse is 77. His respiration is 20 and oxygen saturation is 98%.   ECOG = 1  0 - Asymptomatic (Fully active, able to carry on all predisease activities without restriction)  1 - Symptomatic but completely ambulatory (Restricted in physically strenuous activity but ambulatory and able to carry out work of a light or sedentary nature. For example, light housework, office work)  2 - Symptomatic, <50% in bed during the day (Ambulatory and capable of all self care but unable to carry out any work activities. Up and about more than 50% of waking hours)  3 - Symptomatic, >50% in bed, but not bedbound (Capable of only limited self-care, confined to bed or chair 50% or more of waking hours)  4 - Bedbound (Completely disabled. Cannot carry on any self-care. Totally confined to bed or chair)  5 - Death   Eustace Pen MM, Creech RH, Tormey DC, et al. [phone]). "Toxicity and response criteria of the [facility]". [place] Oncol. 5 (6): 649-55  Alert, no acute distress,  no focal deficits     LABORATORY DATA:  Lab Results  Component Value Date   WBC 8.4 02/28/2015   HGB 12.1* 02/28/2015   HCT 35.6* 02/28/2015   MCV 90.8 02/28/2015   PLT 196 02/28/2015   Lab Results  Component Value Date   NA 136 02/28/2015   K 4.2 02/28/2015   CL 102 02/28/2015   CO2 29 02/28/2015   Lab Results  Component Value Date   ALT 72* 02/28/2015   AST 38 02/28/2015   ALKPHOS 22* 02/28/2015   BILITOT 1.9* 02/28/2015      RADIOGRAPHY: No results found.     IMPRESSION:  The patient has enrolled in the Embark trial.  He is appropriate to proceed with radiation treatment to the chest bilaterally for the prevention of gynecomastia. I discussed this treatment with the patient including the pros and cons of the treatment as well as the possible side effects and risks. In general, the patient is aware that we would expect this to be well tolerated with the primary issue possible  skin irritation.   PLAN:  the patient wishes to proceed with this treatment. He will undergo simulation and we will plan to deliver 1 fraction of radiation treatment to the breast regions bilaterally to a dose of 10 gray.    ________________________________   Jodelle Gross, MD, PhD   **Disclaimer: This note was dictated with voice recognition software. Similar sounding words can inadvertently be transcribed and this note may contain transcription errors which may not have been corrected upon publication of note.**

## 2015-08-22 ENCOUNTER — Encounter: Payer: Self-pay | Admitting: Cardiothoracic Surgery

## 2015-08-22 ENCOUNTER — Ambulatory Visit (INDEPENDENT_AMBULATORY_CARE_PROVIDER_SITE_OTHER): Payer: PPO | Admitting: Cardiothoracic Surgery

## 2015-08-22 VITALS — BP 129/82 | HR 68 | Resp 20 | Ht 67.0 in | Wt 196.0 lb

## 2015-08-22 DIAGNOSIS — Z8546 Personal history of malignant neoplasm of prostate: Secondary | ICD-10-CM

## 2015-08-22 DIAGNOSIS — J9859 Other diseases of mediastinum, not elsewhere classified: Secondary | ICD-10-CM

## 2015-08-22 NOTE — Progress Notes (Signed)
PCP is [name], MD Referring Provider is Jacolyn Reedy, MD  Chief Complaint  Patient presents with  . Routine Post Op    3 month f/u, Chest CT and Whole Body BONE SCAN 07/13/15    HPI: 6 month followup after sternotomy for resection of a large benign thymoma. Patient is done well. The patient had a recent CT scan of the chest as part of  A study  the patient was enrolled for follow-up of postsurgical prostate cancer.I personally reviewed the CT scan which showed no evidence recurrent thymoma, well-healed chest and sternum. He has no symptoms of chest pain or sternal discomfort. Past Medical History  Diagnosis Date  . Hypertension   . Glaucoma   . Cough   . PFO (patent foramen ovale)     PFO noted on coronary CT 09/05/14  . Prostate cancer [facility]) 2009    Past Surgical History  Procedure Laterality Date  . Prostate surgery  2009  . Cholecystectomy    . Radiation treatment    . Sternotomy N/A 02/26/2015    Procedure: STERNOTOMY;  Surgeon: Ivin Poot, MD;  Location: [facility] OR;  Service: Thoracic;  Laterality: N/A;  . Resection of mediastinal mass N/A 02/26/2015    Procedure: RESECTION OF MEDIASTINAL MASS;  Surgeon: Ivin Poot, MD;  Location: [facility] OR;  Service: Thoracic;  Laterality: N/A;    Family History  Problem Relation Age of Onset  . Heart disease Brother   . COPD Brother   . Cancer Brother     liver  . Liver cancer Brother   . Heart disease Mother     MI  . COPD Mother   . Pneumonia Mother   . Diabetes Mother   . COPD Father     Social History Social History  Substance Use Topics  . Smoking status: Never Smoker   . Smokeless tobacco: Never Used  . Alcohol Use: No    Current Outpatient Prescriptions  Medication Sig Dispense Refill  . acetaminophen (TYLENOL) 500 MG tablet Take 2 tablets (1,000 mg total) by mouth every 6 (six) hours as needed for mild pain or fever. 30 tablet 0  . aspirin 325 MG tablet Take 325 mg by mouth daily.    Marland Kitchen  diltiazem (CARTIA XT) 120 MG 24 hr capsule Take 120 mg by mouth daily.    . Flaxseed, Linseed, (FLAXSEED OIL PO) Take 2,800 mg by mouth daily.    Marland Kitchen latanoprost (XALATAN) 0.005 % ophthalmic solution Place 1 drop into both eyes at bedtime.    . meloxicam (MOBIC) 15 MG tablet TAKE 1 TABLET BY MOUTH EVERY DAY WITH FOOD FOR PAIN  0  . methocarbamol (ROBAXIN) 500 MG tablet TAKE 1 TABLET BY MOUTH 3 TIMES A DAY AS NEEDED FOR MUSCLE SPASM  0  . Multiple Vitamin (MULTIVITAMIN) tablet Take 1 tablet by mouth daily.    . niacin (SLO-NIACIN) 500 MG tablet Take 500 mg by mouth at bedtime.    Marland Kitchen PRESCRIPTION MEDICATION 1 application by Intracavernosal route daily as needed (for erectile dysfunction). Reported on 08/08/2015    . sildenafil (REVATIO) 20 MG tablet Take 20 mg by mouth daily as needed (for erectile dysfunction).     . timolol (TIMOPTIC) 0.5 % ophthalmic solution Place 1 drop into both eyes daily.     No current facility-administered medications for this visit.    No Known Allergies  Review of Systems: No weight loss No shortness of breath No headache No abdominal pain or  change in bowel habits No dysuria or hematuria No ankle edema or leg tenderness No rash or skin lesions  BP 129/82 mmHg  Pulse 68  Resp 20  Ht 5\' 7"  (1.702 m)  Wt 196 lb (88.905 kg)  BMI 30.69 kg/m2  SpO2 98% Physical Exam: Alert and comfortable Lungs clear Heart rate regular Sternal incision well-healed Neck without adenopathy Abdomen soft nontender Extremities warm without edema Neuro intact  Diagnostic Tests: CT scan performed reviewed demonstrating no recurrence of mediastinal tumor-thymoma  Impression: Doing well 6 months after successful resection of large thymoma via sternotomy Will not need further surveillance scans for thymoma Plan: Return as needed   Len Childs, MD [facility] [phone]

## 2015-09-14 DIAGNOSIS — M4722 Other spondylosis with radiculopathy, cervical region: Secondary | ICD-10-CM | POA: Diagnosis not present

## 2015-09-18 ENCOUNTER — Ambulatory Visit: Payer: Self-pay | Admitting: Radiation Oncology

## 2015-09-19 ENCOUNTER — Ambulatory Visit
Admission: RE | Admit: 2015-09-19 | Discharge: 2015-09-19 | Disposition: A | Discharge status: Home / Self Care | Payer: PPO | Source: Ambulatory Visit | Attending: Radiation Oncology | Admitting: Radiation Oncology

## 2015-09-19 ENCOUNTER — Encounter: Payer: Self-pay | Admitting: Radiation Oncology

## 2015-09-19 VITALS — BP 145/75 | HR 67 | Temp 97.8°F | Ht 67.0 in | Wt 195.9 lb

## 2015-09-19 DIAGNOSIS — Z7982 Long term (current) use of aspirin: Secondary | ICD-10-CM | POA: Insufficient documentation

## 2015-09-19 DIAGNOSIS — R05 Cough: Secondary | ICD-10-CM | POA: Diagnosis not present

## 2015-09-19 DIAGNOSIS — Z9049 Acquired absence of other specified parts of digestive tract: Secondary | ICD-10-CM | POA: Insufficient documentation

## 2015-09-19 DIAGNOSIS — Z800 Family history of malignant neoplasm of digestive organs: Secondary | ICD-10-CM | POA: Diagnosis not present

## 2015-09-19 DIAGNOSIS — I10 Essential (primary) hypertension: Secondary | ICD-10-CM | POA: Diagnosis not present

## 2015-09-19 DIAGNOSIS — Q211 Atrial septal defect: Secondary | ICD-10-CM | POA: Insufficient documentation

## 2015-09-19 DIAGNOSIS — T50905A Adverse effect of unspecified drugs, medicaments and biological substances, initial encounter: Secondary | ICD-10-CM

## 2015-09-19 DIAGNOSIS — C61 Malignant neoplasm of prostate: Secondary | ICD-10-CM | POA: Insufficient documentation

## 2015-09-19 DIAGNOSIS — Z923 Personal history of irradiation: Secondary | ICD-10-CM | POA: Insufficient documentation

## 2015-09-19 DIAGNOSIS — Z8546 Personal history of malignant neoplasm of prostate: Secondary | ICD-10-CM | POA: Diagnosis not present

## 2015-09-19 DIAGNOSIS — N62 Hypertrophy of breast: Secondary | ICD-10-CM | POA: Diagnosis not present

## 2015-09-19 DIAGNOSIS — H409 Unspecified glaucoma: Secondary | ICD-10-CM | POA: Diagnosis not present

## 2015-09-19 NOTE — Progress Notes (Addendum)
Nicholas Daniels here for reassessment s/p XRT for Gynecomastia.  He reports "a little tenderness" in the tx field.  Notes redness of the sites after showering, but dissipates afterwards.  Accompanied by his Spouse.  Taking Xtandi since 08/07/15

## 2015-09-20 NOTE — Progress Notes (Signed)
Radiation Oncology         (336) [phone] ________________________________  Name: Nicholas Daniels MRN: [id number]  Date: 09/19/2015  DOB: Feb 21, 1951  Follow-Up Visit Note  CC: [name], MD  Rana Snare, MD  Diagnosis:   Prostate cancer, and Gynecomastia prevention on Embark clinical trial  Interval Since Last Radiation:  5 weeks  08/09/15:  10 Gy to bilateral breast/chest wall in 1 fraction  Narrative: Mr. Pettis is a pleasant patient with a history of recurrent prostate cancer. He is currently enrolled in the [employer]ark clinical trial overseen by Dr. Risa Grill. The patient is status post prostatectomy and subsequently underwent salvage radiation treatment in 2012. Unfortunately the patient has had a further increase in his PSA and has enrolled in the above study. Based on his randomization, the patient was dispositioned to radiation treatment to the chest for prevention of gynecomastia.  He completed 1 fraction of this on 08/09/15, and comes today for follow up.                          On review of systems, the patient states -he is doing well overall. He is not experiencing any chest pain or shortness of breath. He states that he does have some sensitivity of his skin when showering particularly on the nipple area from his previous treatment location. He denies any bleeding crusting or discharge. He is currently using radioplex for his skin. No other complaints or verbalized.  Past Medical History:  Past Medical History  Diagnosis Date  . Hypertension   . Glaucoma   . Cough   . PFO (patent foramen ovale)     PFO noted on coronary CT 09/05/14  . Prostate cancer [facility]) 2009    Past Surgical History: Past Surgical History  Procedure Laterality Date  . Prostate surgery  2009  . Cholecystectomy    . Radiation treatment    . Sternotomy N/A 02/26/2015    Procedure: STERNOTOMY;  Surgeon: Ivin Poot, MD;  Location: [facility];  Service: Thoracic;  Laterality: N/A;  . Resection of  mediastinal mass N/A 02/26/2015    Procedure: RESECTION OF MEDIASTINAL MASS;  Surgeon: Ivin Poot, MD;  Location: [facility] OR;  Service: Thoracic;  Laterality: N/A;    Social History:  Social History   Social History  . Marital Status: Married    Spouse Name: N/A  . Number of Children: N/A  . Years of Education: N/A   Occupational History  . Not on file.   Social History Main Topics  . Smoking status: Never Smoker   . Smokeless tobacco: Never Used  . Alcohol Use: No  . Drug Use: No  . Sexual Activity: Not on file   Other Topics Concern  . Not on file   Social History Narrative    Family History: Family History  Problem Relation Age of Onset  . Heart disease Brother   . COPD Brother   . Cancer Brother     liver  . Liver cancer Brother   . Heart disease Mother     MI  . COPD Mother   . Pneumonia Mother   . Diabetes Mother   . COPD Father     ALLERGIES:  has No Known Allergies.  Meds: Current Outpatient Prescriptions  Medication Sig Dispense Refill  . acetaminophen (TYLENOL) 500 MG tablet Take 2 tablets (1,000 mg total) by mouth every 6 (six) hours as needed for mild pain or fever. [place]  tablet 0  . aspirin 325 MG tablet Take 325 mg by mouth daily.    Marland Kitchen diltiazem (CARTIA XT) 120 MG 24 hr capsule Take 120 mg by mouth daily.    . Enzalutamide (XTANDI PO) Take 4 tablets by mouth daily.    . Flaxseed, Linseed, (FLAXSEED OIL PO) Take 2,800 mg by mouth daily.    Marland Kitchen latanoprost (XALATAN) 0.005 % ophthalmic solution Place 1 drop into both eyes at bedtime.    . Multiple Vitamin (MULTIVITAMIN) tablet Take 1 tablet by mouth daily.    . niacin (SLO-NIACIN) 500 MG tablet Take 500 mg by mouth at bedtime.    . sildenafil (REVATIO) 20 MG tablet Take 20 mg by mouth daily as needed (for erectile dysfunction).     . timolol (TIMOPTIC) 0.5 % ophthalmic solution Place 1 drop into both eyes daily.     No current facility-administered medications for this encounter.    Physical  Findings:  height is 5\' 7"  (1.702 m) and weight is 195 lb 14.4 oz (88.86 kg). His temperature is 97.8 F (36.6 C). His blood pressure is 145/75 and his pulse is 67.  In general this is a well appearing caucasian male in no acute distress. He's alert and oriented x4 and appropriate throughout the examination. Cardiopulmonary assessment is negative for acute distress and he exhibits normal effort. His chest wall is evaluated and minimal hyperpigmentation is noted over the male breast tissue bilaterally on the anterior side of the thorax above the nipples left greater than right without desquamation.    Lab Findings: Lab Results  Component Value Date   WBC 8.4 02/28/2015   HGB 12.1* 02/28/2015   HCT 35.6* 02/28/2015   MCV 90.8 02/28/2015   PLT 196 02/28/2015     Radiographic Findings: No results found.  Impression/Plan: 1. Gynecomastia prevention.The patient is doing well since completing his radiotherapy. We discussed the rationale for being available on an as-needed basis to the patient if he has any questions or concerns regarding his previous therapy. He is encouraged to call if he would like to be seen or has any of these concerns. He can switch to using vitamin E on the skin at this time for the mild hyperpigmentation changes. We did discuss that he would be outside during the summer with his shirt off, that he needs to be mindful of keeping sunscreen on his skin at all times. He states agreement and understanding. 2. Prostate Cancer. The patient will continue to follow up with his urologist, and we would be happy to see him if there is an additional need for radiotherapy in the future.    Carola Rhine, PAC

## 2015-09-28 DIAGNOSIS — M4722 Other spondylosis with radiculopathy, cervical region: Secondary | ICD-10-CM | POA: Diagnosis not present

## 2015-10-01 ENCOUNTER — Other Ambulatory Visit (HOSPITAL_COMMUNITY): Payer: Self-pay | Admitting: Urology

## 2015-10-01 DIAGNOSIS — C61 Malignant neoplasm of prostate: Secondary | ICD-10-CM

## 2015-10-11 NOTE — Progress Notes (Signed)
°  Radiation Oncology         [phone] ________________________________  Name: Nicholas Daniels MRN: [id number]  Date: 08/09/2015  DOB: March 06, 1951  End of Treatment Note  Diagnosis: Prostate cancer and Gynecomastia prevention on Embark clinical trial  Indication for treatment:  Gynecomastia Prevention       Radiation treatment dates:  08/09/15  Site/dose: The bilateral breasts with 10 Gy in 1 fraction.  Beams/energy: En Face // 12MeV Electron  Narrative: The patient tolerated radiation treatment relatively well.  Plan: The patient has completed radiation treatment. The patient will return to radiation oncology clinic for routine followup in one month. I advised them to call or return sooner if they have any questions or concerns related to their recovery or treatment.  ------------------------------------------------  Jodelle Gross, MD, PhD  This document serves as a record of services personally performed by Kyung Rudd, MD. It was created on his behalf by Darcus Austin, a trained medical scribe. The creation of this record is based on the scribe's personal observations and the provider's statements to them. This document has been checked and approved by the attending provider.

## 2015-10-26 DIAGNOSIS — H2513 Age-related nuclear cataract, bilateral: Secondary | ICD-10-CM | POA: Diagnosis not present

## 2015-10-26 DIAGNOSIS — H40012 Open angle with borderline findings, low risk, left eye: Secondary | ICD-10-CM | POA: Diagnosis not present

## 2015-10-26 DIAGNOSIS — H401111 Primary open-angle glaucoma, right eye, mild stage: Secondary | ICD-10-CM | POA: Diagnosis not present

## 2015-10-27 NOTE — Addendum Note (Signed)
Encounter addended by: Kyung Rudd, MD on: 10/27/2015  5:43 PM<BR>     Documentation filed: Notes Section, Visit Diagnoses

## 2015-10-27 NOTE — Progress Notes (Signed)
  Radiation Oncology         [phone]) [phone] ________________________________  Name: DEVLON TATGE MRN: [id number]  Date: 08/08/2015  DOB: 05/03/1950  SIMULATION AND TREATMENT PLANNING NOTE  DIAGNOSIS:     ICD-9-CM ICD-10-CM   1. Drug-induced gynecomastia 611.1 N62    E980.5       Site:  Left breast and right breast  NARRATIVE:  The patient was brought to the linear accelerator suite.  Identity was confirmed.  All relevant records and images related to the planned course of therapy were reviewed.   Written consent to proceed with treatment was confirmed which was freely given after reviewing the details related to the planned course of therapy had been reviewed with the patient.  Then, the patient was set-up in a stable reproducible  supine position for radiation therapy.  a clinical set up was established   Surface markings were placed.     Treatment planning then occurred.  The radiation prescription was entered and confirmed.  A total of 2 complex treatment devices were fabricated which relate to the designed radiation treatment fields. Each of these customized fields/ complex treatment devices will be used on a daily basis during the radiation course. I have requested : Special port plan.   PLAN:  The patient will receive 10 Gy in 1 fractions to each target region.  ________________________________   Jodelle Gross, MD, PhD

## 2016-01-09 DIAGNOSIS — C61 Malignant neoplasm of prostate: Secondary | ICD-10-CM | POA: Diagnosis not present

## 2016-01-17 ENCOUNTER — Ambulatory Visit (HOSPITAL_COMMUNITY): Payer: PPO

## 2016-01-17 ENCOUNTER — Encounter (HOSPITAL_COMMUNITY): Payer: PPO

## 2016-01-23 ENCOUNTER — Ambulatory Visit (HOSPITAL_COMMUNITY): Payer: PPO

## 2016-01-23 ENCOUNTER — Encounter (HOSPITAL_COMMUNITY)
Admission: RE | Admit: 2016-01-23 | Discharge: 2016-01-23 | Disposition: A | Discharge status: Home / Self Care | Payer: PPO | Source: Ambulatory Visit | Attending: Urology | Admitting: Urology

## 2016-01-23 DIAGNOSIS — C61 Malignant neoplasm of prostate: Secondary | ICD-10-CM

## 2016-01-23 MED ORDER — TECHNETIUM TC 99M MEDRONATE IV KIT: 25.0000 | PACK | Freq: Once | INTRAVENOUS | Status: DC | PRN

## 2016-04-14 DIAGNOSIS — E784 Other hyperlipidemia: Secondary | ICD-10-CM | POA: Diagnosis not present

## 2016-04-14 DIAGNOSIS — I10 Essential (primary) hypertension: Secondary | ICD-10-CM | POA: Diagnosis not present

## 2016-04-24 DIAGNOSIS — Z1212 Encounter for screening for malignant neoplasm of rectum: Secondary | ICD-10-CM | POA: Diagnosis not present

## 2016-04-28 DIAGNOSIS — Z0000 Encounter for general adult medical examination without abnormal findings: Secondary | ICD-10-CM | POA: Diagnosis not present

## 2016-04-28 DIAGNOSIS — I10 Essential (primary) hypertension: Secondary | ICD-10-CM | POA: Diagnosis not present

## 2016-04-28 DIAGNOSIS — C61 Malignant neoplasm of prostate: Secondary | ICD-10-CM | POA: Diagnosis not present

## 2016-04-28 DIAGNOSIS — R5383 Other fatigue: Secondary | ICD-10-CM | POA: Diagnosis not present

## 2016-04-28 DIAGNOSIS — E784 Other hyperlipidemia: Secondary | ICD-10-CM | POA: Diagnosis not present

## 2016-04-28 DIAGNOSIS — Z1389 Encounter for screening for other disorder: Secondary | ICD-10-CM | POA: Diagnosis not present

## 2016-04-28 DIAGNOSIS — Z6829 Body mass index (BMI) 29.0-29.9, adult: Secondary | ICD-10-CM | POA: Diagnosis not present

## 2016-05-02 DIAGNOSIS — H401111 Primary open-angle glaucoma, right eye, mild stage: Secondary | ICD-10-CM | POA: Diagnosis not present

## 2016-05-02 DIAGNOSIS — H2513 Age-related nuclear cataract, bilateral: Secondary | ICD-10-CM | POA: Diagnosis not present

## 2016-05-02 DIAGNOSIS — H40012 Open angle with borderline findings, low risk, left eye: Secondary | ICD-10-CM | POA: Diagnosis not present

## 2016-05-06 ENCOUNTER — Other Ambulatory Visit: Payer: Self-pay | Admitting: Urology

## 2016-05-06 DIAGNOSIS — C61 Malignant neoplasm of prostate: Secondary | ICD-10-CM

## 2016-05-12 ENCOUNTER — Other Ambulatory Visit (HOSPITAL_COMMUNITY): Payer: Self-pay | Admitting: Urology

## 2016-05-12 DIAGNOSIS — C61 Malignant neoplasm of prostate: Secondary | ICD-10-CM

## 2016-05-14 DIAGNOSIS — D1801 Hemangioma of skin and subcutaneous tissue: Secondary | ICD-10-CM | POA: Diagnosis not present

## 2016-05-14 DIAGNOSIS — D485 Neoplasm of uncertain behavior of skin: Secondary | ICD-10-CM | POA: Diagnosis not present

## 2016-05-14 DIAGNOSIS — C44519 Basal cell carcinoma of skin of other part of trunk: Secondary | ICD-10-CM | POA: Diagnosis not present

## 2016-05-14 DIAGNOSIS — D2272 Melanocytic nevi of left lower limb, including hip: Secondary | ICD-10-CM | POA: Diagnosis not present

## 2016-05-14 DIAGNOSIS — L821 Other seborrheic keratosis: Secondary | ICD-10-CM | POA: Diagnosis not present

## 2016-05-14 DIAGNOSIS — D2261 Melanocytic nevi of right upper limb, including shoulder: Secondary | ICD-10-CM | POA: Diagnosis not present

## 2016-05-14 DIAGNOSIS — D225 Melanocytic nevi of trunk: Secondary | ICD-10-CM | POA: Diagnosis not present

## 2016-05-23 DIAGNOSIS — C44519 Basal cell carcinoma of skin of other part of trunk: Secondary | ICD-10-CM | POA: Diagnosis not present

## 2016-05-23 DIAGNOSIS — Z85828 Personal history of other malignant neoplasm of skin: Secondary | ICD-10-CM | POA: Diagnosis not present

## 2016-06-26 DIAGNOSIS — C61 Malignant neoplasm of prostate: Secondary | ICD-10-CM | POA: Diagnosis not present

## 2016-07-03 ENCOUNTER — Ambulatory Visit (HOSPITAL_COMMUNITY)
Admission: RE | Admit: 2016-07-03 | Discharge: 2016-07-03 | Disposition: A | Discharge status: Home / Self Care | Payer: PPO | Source: Ambulatory Visit | Attending: Urology | Admitting: Urology

## 2016-07-03 DIAGNOSIS — C61 Malignant neoplasm of prostate: Secondary | ICD-10-CM

## 2016-07-03 MED ORDER — TECHNETIUM TC 99M MEDRONATE IV KIT
25.0000 | PACK | Freq: Once | INTRAVENOUS | Status: AC | PRN
  Administered 2016-07-03: 22 via INTRAVENOUS

## 2016-12-11 DIAGNOSIS — C61 Malignant neoplasm of prostate: Secondary | ICD-10-CM | POA: Diagnosis not present

## 2016-12-17 ENCOUNTER — Other Ambulatory Visit: Payer: Self-pay | Admitting: Urology

## 2016-12-17 DIAGNOSIS — C61 Malignant neoplasm of prostate: Secondary | ICD-10-CM

## 2016-12-23 ENCOUNTER — Ambulatory Visit (HOSPITAL_COMMUNITY)
Admission: RE | Admit: 2016-12-23 | Discharge: 2016-12-23 | Disposition: A | Discharge status: Home / Self Care | Payer: PPO | Source: Ambulatory Visit | Attending: Urology | Admitting: Urology

## 2016-12-23 DIAGNOSIS — C61 Malignant neoplasm of prostate: Secondary | ICD-10-CM | POA: Diagnosis not present

## 2016-12-23 MED ORDER — TECHNETIUM TC 99M MEDRONATE IV KIT
25.0000 | PACK | Freq: Once | INTRAVENOUS | Status: AC | PRN
  Administered 2016-12-23: 21.8 via INTRAVENOUS

## 2017-03-10 ENCOUNTER — Other Ambulatory Visit: Payer: Self-pay | Admitting: Urology

## 2017-03-10 DIAGNOSIS — C61 Malignant neoplasm of prostate: Secondary | ICD-10-CM

## 2017-04-23 DIAGNOSIS — R82998 Other abnormal findings in urine: Secondary | ICD-10-CM | POA: Diagnosis not present

## 2017-04-23 DIAGNOSIS — E7849 Other hyperlipidemia: Secondary | ICD-10-CM | POA: Diagnosis not present

## 2017-04-23 DIAGNOSIS — I10 Essential (primary) hypertension: Secondary | ICD-10-CM | POA: Diagnosis not present

## 2017-04-30 DIAGNOSIS — R635 Abnormal weight gain: Secondary | ICD-10-CM | POA: Diagnosis not present

## 2017-04-30 DIAGNOSIS — Z0000 Encounter for general adult medical examination without abnormal findings: Secondary | ICD-10-CM | POA: Diagnosis not present

## 2017-04-30 DIAGNOSIS — I10 Essential (primary) hypertension: Secondary | ICD-10-CM | POA: Diagnosis not present

## 2017-04-30 DIAGNOSIS — C61 Malignant neoplasm of prostate: Secondary | ICD-10-CM | POA: Diagnosis not present

## 2017-04-30 DIAGNOSIS — Z1389 Encounter for screening for other disorder: Secondary | ICD-10-CM | POA: Diagnosis not present

## 2017-04-30 DIAGNOSIS — E7849 Other hyperlipidemia: Secondary | ICD-10-CM | POA: Diagnosis not present

## 2017-04-30 DIAGNOSIS — Z6832 Body mass index (BMI) 32.0-32.9, adult: Secondary | ICD-10-CM | POA: Diagnosis not present

## 2017-05-01 DIAGNOSIS — Z1212 Encounter for screening for malignant neoplasm of rectum: Secondary | ICD-10-CM | POA: Diagnosis not present

## 2017-05-06 DIAGNOSIS — H2513 Age-related nuclear cataract, bilateral: Secondary | ICD-10-CM | POA: Diagnosis not present

## 2017-05-06 DIAGNOSIS — H40013 Open angle with borderline findings, low risk, bilateral: Secondary | ICD-10-CM | POA: Diagnosis not present

## 2017-05-06 DIAGNOSIS — H04123 Dry eye syndrome of bilateral lacrimal glands: Secondary | ICD-10-CM | POA: Diagnosis not present

## 2017-05-07 ENCOUNTER — Other Ambulatory Visit (HOSPITAL_COMMUNITY): Payer: PPO

## 2017-05-07 ENCOUNTER — Ambulatory Visit (HOSPITAL_COMMUNITY): Payer: PPO

## 2017-06-02 ENCOUNTER — Encounter (HOSPITAL_COMMUNITY)
Admission: RE | Admit: 2017-06-02 | Discharge: 2017-06-02 | Disposition: A | Discharge status: Home / Self Care | Payer: PPO | Source: Ambulatory Visit | Attending: Urology | Admitting: Urology

## 2017-06-02 DIAGNOSIS — C61 Malignant neoplasm of prostate: Secondary | ICD-10-CM | POA: Insufficient documentation

## 2017-06-02 MED ORDER — TECHNETIUM TC 99M MEDRONATE IV KIT
25.0000 | PACK | Freq: Once | INTRAVENOUS | Status: AC | PRN
  Administered 2017-06-02: 25 via INTRAVENOUS

## 2017-06-03 ENCOUNTER — Other Ambulatory Visit (HOSPITAL_COMMUNITY): Payer: PPO

## 2017-06-03 ENCOUNTER — Encounter (HOSPITAL_COMMUNITY): Payer: PPO

## 2017-07-02 DIAGNOSIS — L814 Other melanin hyperpigmentation: Secondary | ICD-10-CM | POA: Diagnosis not present

## 2017-07-02 DIAGNOSIS — L570 Actinic keratosis: Secondary | ICD-10-CM | POA: Diagnosis not present

## 2017-07-02 DIAGNOSIS — D1801 Hemangioma of skin and subcutaneous tissue: Secondary | ICD-10-CM | POA: Diagnosis not present

## 2017-07-02 DIAGNOSIS — D2262 Melanocytic nevi of left upper limb, including shoulder: Secondary | ICD-10-CM | POA: Diagnosis not present

## 2017-07-02 DIAGNOSIS — D2239 Melanocytic nevi of other parts of face: Secondary | ICD-10-CM | POA: Diagnosis not present

## 2017-07-02 DIAGNOSIS — L821 Other seborrheic keratosis: Secondary | ICD-10-CM | POA: Diagnosis not present

## 2017-07-02 DIAGNOSIS — D225 Melanocytic nevi of trunk: Secondary | ICD-10-CM | POA: Diagnosis not present

## 2017-07-02 DIAGNOSIS — D2272 Melanocytic nevi of left lower limb, including hip: Secondary | ICD-10-CM | POA: Diagnosis not present

## 2017-07-02 DIAGNOSIS — Z85828 Personal history of other malignant neoplasm of skin: Secondary | ICD-10-CM | POA: Diagnosis not present

## 2017-09-02 DIAGNOSIS — L821 Other seborrheic keratosis: Secondary | ICD-10-CM | POA: Diagnosis not present

## 2017-09-02 DIAGNOSIS — Z85828 Personal history of other malignant neoplasm of skin: Secondary | ICD-10-CM | POA: Diagnosis not present

## 2017-09-02 DIAGNOSIS — D2261 Melanocytic nevi of right upper limb, including shoulder: Secondary | ICD-10-CM | POA: Diagnosis not present

## 2017-09-02 DIAGNOSIS — L570 Actinic keratosis: Secondary | ICD-10-CM | POA: Diagnosis not present

## 2017-09-28 ENCOUNTER — Other Ambulatory Visit: Payer: Self-pay | Admitting: Urology

## 2017-09-28 DIAGNOSIS — C61 Malignant neoplasm of prostate: Secondary | ICD-10-CM

## 2017-11-04 DIAGNOSIS — C61 Malignant neoplasm of prostate: Secondary | ICD-10-CM | POA: Diagnosis not present

## 2017-11-10 ENCOUNTER — Encounter (HOSPITAL_COMMUNITY)
Admission: RE | Admit: 2017-11-10 | Discharge: 2017-11-10 | Disposition: A | Discharge status: Home / Self Care | Payer: PPO | Source: Ambulatory Visit | Attending: Urology | Admitting: Urology

## 2017-11-10 DIAGNOSIS — C61 Malignant neoplasm of prostate: Secondary | ICD-10-CM | POA: Diagnosis not present

## 2017-11-10 MED ORDER — TECHNETIUM TC 99M MEDRONATE IV KIT: 20.0000 | PACK | Freq: Once | INTRAVENOUS | Status: DC | PRN

## 2017-12-04 DIAGNOSIS — Z6831 Body mass index (BMI) 31.0-31.9, adult: Secondary | ICD-10-CM | POA: Diagnosis not present

## 2017-12-04 DIAGNOSIS — M545 Low back pain: Secondary | ICD-10-CM | POA: Diagnosis not present

## 2017-12-04 DIAGNOSIS — H9392 Unspecified disorder of left ear: Secondary | ICD-10-CM | POA: Diagnosis not present

## 2017-12-04 DIAGNOSIS — Z23 Encounter for immunization: Secondary | ICD-10-CM | POA: Diagnosis not present

## 2018-04-16 ENCOUNTER — Other Ambulatory Visit: Payer: Self-pay | Admitting: Urology

## 2018-04-16 DIAGNOSIS — C61 Malignant neoplasm of prostate: Secondary | ICD-10-CM

## 2018-04-30 DIAGNOSIS — I10 Essential (primary) hypertension: Secondary | ICD-10-CM | POA: Diagnosis not present

## 2018-04-30 DIAGNOSIS — R82998 Other abnormal findings in urine: Secondary | ICD-10-CM | POA: Diagnosis not present

## 2018-04-30 DIAGNOSIS — E7849 Other hyperlipidemia: Secondary | ICD-10-CM | POA: Diagnosis not present

## 2018-05-07 DIAGNOSIS — R5383 Other fatigue: Secondary | ICD-10-CM | POA: Diagnosis not present

## 2018-05-07 DIAGNOSIS — M545 Low back pain: Secondary | ICD-10-CM | POA: Diagnosis not present

## 2018-05-07 DIAGNOSIS — Z1331 Encounter for screening for depression: Secondary | ICD-10-CM | POA: Diagnosis not present

## 2018-05-07 DIAGNOSIS — I10 Essential (primary) hypertension: Secondary | ICD-10-CM | POA: Diagnosis not present

## 2018-05-07 DIAGNOSIS — Z6831 Body mass index (BMI) 31.0-31.9, adult: Secondary | ICD-10-CM | POA: Diagnosis not present

## 2018-05-07 DIAGNOSIS — Z0000 Encounter for general adult medical examination without abnormal findings: Secondary | ICD-10-CM | POA: Diagnosis not present

## 2018-05-07 DIAGNOSIS — H4089 Other specified glaucoma: Secondary | ICD-10-CM | POA: Diagnosis not present

## 2018-05-07 DIAGNOSIS — H9193 Unspecified hearing loss, bilateral: Secondary | ICD-10-CM | POA: Diagnosis not present

## 2018-05-07 DIAGNOSIS — C61 Malignant neoplasm of prostate: Secondary | ICD-10-CM | POA: Diagnosis not present

## 2018-05-10 DIAGNOSIS — Z1212 Encounter for screening for malignant neoplasm of rectum: Secondary | ICD-10-CM | POA: Diagnosis not present

## 2018-05-11 ENCOUNTER — Other Ambulatory Visit (HOSPITAL_COMMUNITY): Payer: PPO

## 2018-05-11 ENCOUNTER — Ambulatory Visit (HOSPITAL_COMMUNITY): Payer: PPO

## 2018-05-14 ENCOUNTER — Other Ambulatory Visit (HOSPITAL_COMMUNITY): Payer: PPO

## 2018-05-14 ENCOUNTER — Ambulatory Visit (HOSPITAL_COMMUNITY): Payer: PPO

## 2018-05-18 DIAGNOSIS — C61 Malignant neoplasm of prostate: Secondary | ICD-10-CM | POA: Diagnosis not present

## 2018-05-20 ENCOUNTER — Encounter (HOSPITAL_COMMUNITY)
Admission: RE | Admit: 2018-05-20 | Discharge: 2018-05-20 | Disposition: A | Discharge status: Home / Self Care | Payer: PPO | Source: Ambulatory Visit | Attending: Urology | Admitting: Urology

## 2018-05-20 DIAGNOSIS — C61 Malignant neoplasm of prostate: Secondary | ICD-10-CM | POA: Diagnosis not present

## 2018-05-20 MED ORDER — TECHNETIUM TC 99M MEDRONATE IV KIT
20.0000 | PACK | Freq: Once | INTRAVENOUS | Status: AC | PRN
  Administered 2018-05-20: 21.3 via INTRAVENOUS

## 2018-06-15 DIAGNOSIS — H40013 Open angle with borderline findings, low risk, bilateral: Secondary | ICD-10-CM | POA: Diagnosis not present

## 2018-06-15 DIAGNOSIS — H2513 Age-related nuclear cataract, bilateral: Secondary | ICD-10-CM | POA: Diagnosis not present

## 2018-06-15 DIAGNOSIS — H04123 Dry eye syndrome of bilateral lacrimal glands: Secondary | ICD-10-CM | POA: Diagnosis not present

## 2018-07-15 DIAGNOSIS — Z85828 Personal history of other malignant neoplasm of skin: Secondary | ICD-10-CM | POA: Diagnosis not present

## 2018-07-15 DIAGNOSIS — L570 Actinic keratosis: Secondary | ICD-10-CM | POA: Diagnosis not present

## 2018-07-15 DIAGNOSIS — D044 Carcinoma in situ of skin of scalp and neck: Secondary | ICD-10-CM | POA: Diagnosis not present

## 2018-07-15 DIAGNOSIS — D485 Neoplasm of uncertain behavior of skin: Secondary | ICD-10-CM | POA: Diagnosis not present

## 2018-07-21 DIAGNOSIS — C61 Malignant neoplasm of prostate: Secondary | ICD-10-CM | POA: Diagnosis not present

## 2018-07-21 DIAGNOSIS — N3941 Urge incontinence: Secondary | ICD-10-CM | POA: Diagnosis not present

## 2018-09-23 DIAGNOSIS — H04123 Dry eye syndrome of bilateral lacrimal glands: Secondary | ICD-10-CM | POA: Diagnosis not present

## 2018-09-23 DIAGNOSIS — H40013 Open angle with borderline findings, low risk, bilateral: Secondary | ICD-10-CM | POA: Diagnosis not present

## 2018-09-23 DIAGNOSIS — H35373 Puckering of macula, bilateral: Secondary | ICD-10-CM | POA: Diagnosis not present

## 2018-09-23 DIAGNOSIS — H2513 Age-related nuclear cataract, bilateral: Secondary | ICD-10-CM | POA: Diagnosis not present

## 2018-09-23 DIAGNOSIS — Z9889 Other specified postprocedural states: Secondary | ICD-10-CM | POA: Diagnosis not present

## 2018-10-14 ENCOUNTER — Other Ambulatory Visit: Payer: Self-pay | Admitting: Urology

## 2018-10-14 DIAGNOSIS — C61 Malignant neoplasm of prostate: Secondary | ICD-10-CM

## 2018-10-18 DIAGNOSIS — C61 Malignant neoplasm of prostate: Secondary | ICD-10-CM | POA: Diagnosis not present

## 2018-10-21 ENCOUNTER — Encounter (HOSPITAL_COMMUNITY)
Admission: RE | Admit: 2018-10-21 | Discharge: 2018-10-21 | Disposition: A | Discharge status: Home / Self Care | Payer: PPO | Source: Ambulatory Visit | Attending: Urology | Admitting: Urology

## 2018-10-21 ENCOUNTER — Other Ambulatory Visit: Payer: Self-pay

## 2018-10-21 DIAGNOSIS — C61 Malignant neoplasm of prostate: Secondary | ICD-10-CM | POA: Diagnosis not present

## 2018-10-21 MED ORDER — TECHNETIUM TC 99M MEDRONATE IV KIT
22.0000 | PACK | Freq: Once | INTRAVENOUS | Status: AC | PRN
  Administered 2018-10-21: 22 via INTRAVENOUS

## 2018-11-09 DIAGNOSIS — D225 Melanocytic nevi of trunk: Secondary | ICD-10-CM | POA: Diagnosis not present

## 2018-11-09 DIAGNOSIS — D692 Other nonthrombocytopenic purpura: Secondary | ICD-10-CM | POA: Diagnosis not present

## 2018-11-09 DIAGNOSIS — D1801 Hemangioma of skin and subcutaneous tissue: Secondary | ICD-10-CM | POA: Diagnosis not present

## 2018-11-09 DIAGNOSIS — L821 Other seborrheic keratosis: Secondary | ICD-10-CM | POA: Diagnosis not present

## 2018-11-09 DIAGNOSIS — D2262 Melanocytic nevi of left upper limb, including shoulder: Secondary | ICD-10-CM | POA: Diagnosis not present

## 2018-11-09 DIAGNOSIS — Z85828 Personal history of other malignant neoplasm of skin: Secondary | ICD-10-CM | POA: Diagnosis not present

## 2018-12-30 ENCOUNTER — Other Ambulatory Visit: Payer: Self-pay | Admitting: Urology

## 2018-12-30 ENCOUNTER — Other Ambulatory Visit (HOSPITAL_COMMUNITY): Payer: Self-pay | Admitting: Urology

## 2018-12-30 DIAGNOSIS — C61 Malignant neoplasm of prostate: Secondary | ICD-10-CM

## 2019-01-07 DIAGNOSIS — C61 Malignant neoplasm of prostate: Secondary | ICD-10-CM | POA: Diagnosis not present

## 2019-01-12 ENCOUNTER — Encounter (HOSPITAL_COMMUNITY): Payer: Self-pay

## 2019-01-12 ENCOUNTER — Encounter (HOSPITAL_COMMUNITY): Payer: PPO

## 2019-01-19 DIAGNOSIS — L738 Other specified follicular disorders: Secondary | ICD-10-CM | POA: Diagnosis not present

## 2019-01-19 DIAGNOSIS — L408 Other psoriasis: Secondary | ICD-10-CM | POA: Diagnosis not present

## 2019-01-19 DIAGNOSIS — L309 Dermatitis, unspecified: Secondary | ICD-10-CM | POA: Diagnosis not present

## 2019-01-19 DIAGNOSIS — D485 Neoplasm of uncertain behavior of skin: Secondary | ICD-10-CM | POA: Diagnosis not present

## 2019-01-19 DIAGNOSIS — Z85828 Personal history of other malignant neoplasm of skin: Secondary | ICD-10-CM | POA: Diagnosis not present

## 2019-02-02 DIAGNOSIS — C61 Malignant neoplasm of prostate: Secondary | ICD-10-CM | POA: Diagnosis not present

## 2019-02-02 DIAGNOSIS — N3941 Urge incontinence: Secondary | ICD-10-CM | POA: Diagnosis not present

## 2019-02-09 DIAGNOSIS — L738 Other specified follicular disorders: Secondary | ICD-10-CM | POA: Diagnosis not present

## 2019-02-09 DIAGNOSIS — L309 Dermatitis, unspecified: Secondary | ICD-10-CM | POA: Diagnosis not present

## 2019-02-09 DIAGNOSIS — Z85828 Personal history of other malignant neoplasm of skin: Secondary | ICD-10-CM | POA: Diagnosis not present

## 2019-03-10 ENCOUNTER — Other Ambulatory Visit (HOSPITAL_COMMUNITY): Payer: Self-pay | Admitting: Urology

## 2019-03-10 ENCOUNTER — Other Ambulatory Visit: Payer: Self-pay | Admitting: Urology

## 2019-03-10 DIAGNOSIS — C61 Malignant neoplasm of prostate: Secondary | ICD-10-CM

## 2019-03-22 DIAGNOSIS — H04123 Dry eye syndrome of bilateral lacrimal glands: Secondary | ICD-10-CM | POA: Diagnosis not present

## 2019-03-22 DIAGNOSIS — H2513 Age-related nuclear cataract, bilateral: Secondary | ICD-10-CM | POA: Diagnosis not present

## 2019-03-22 DIAGNOSIS — Z9889 Other specified postprocedural states: Secondary | ICD-10-CM | POA: Diagnosis not present

## 2019-03-22 DIAGNOSIS — H35373 Puckering of macula, bilateral: Secondary | ICD-10-CM | POA: Diagnosis not present

## 2019-03-22 DIAGNOSIS — H40013 Open angle with borderline findings, low risk, bilateral: Secondary | ICD-10-CM | POA: Diagnosis not present

## 2019-03-23 DIAGNOSIS — C61 Malignant neoplasm of prostate: Secondary | ICD-10-CM | POA: Diagnosis not present

## 2019-04-04 DIAGNOSIS — H35371 Puckering of macula, right eye: Secondary | ICD-10-CM | POA: Diagnosis not present

## 2019-04-04 DIAGNOSIS — H2512 Age-related nuclear cataract, left eye: Secondary | ICD-10-CM | POA: Diagnosis not present

## 2019-04-04 DIAGNOSIS — H35372 Puckering of macula, left eye: Secondary | ICD-10-CM | POA: Diagnosis not present

## 2019-04-04 DIAGNOSIS — H2511 Age-related nuclear cataract, right eye: Secondary | ICD-10-CM | POA: Diagnosis not present

## 2019-04-06 ENCOUNTER — Other Ambulatory Visit: Payer: Self-pay

## 2019-04-06 ENCOUNTER — Encounter (HOSPITAL_COMMUNITY)
Admission: RE | Admit: 2019-04-06 | Discharge: 2019-04-06 | Disposition: A | Discharge status: Home / Self Care | Payer: Self-pay | Source: Ambulatory Visit | Attending: Urology | Admitting: Urology

## 2019-04-06 ENCOUNTER — Encounter (HOSPITAL_COMMUNITY)
Admission: RE | Admit: 2019-04-06 | Discharge: 2019-04-06 | Disposition: A | Discharge status: Home / Self Care | Payer: PPO | Source: Ambulatory Visit | Attending: Urology | Admitting: Urology

## 2019-04-06 DIAGNOSIS — C61 Malignant neoplasm of prostate: Secondary | ICD-10-CM | POA: Insufficient documentation

## 2019-04-06 MED ORDER — TECHNETIUM TC 99M MEDRONATE IV KIT
21.5000 | PACK | Freq: Once | INTRAVENOUS | Status: AC
  Administered 2019-04-06: 21.5 via INTRAVENOUS

## 2019-04-11 DIAGNOSIS — H35373 Puckering of macula, bilateral: Secondary | ICD-10-CM | POA: Diagnosis not present

## 2019-04-11 DIAGNOSIS — H2513 Age-related nuclear cataract, bilateral: Secondary | ICD-10-CM | POA: Diagnosis not present

## 2019-04-11 DIAGNOSIS — H40013 Open angle with borderline findings, low risk, bilateral: Secondary | ICD-10-CM | POA: Diagnosis not present

## 2019-04-11 DIAGNOSIS — Z9889 Other specified postprocedural states: Secondary | ICD-10-CM | POA: Diagnosis not present

## 2019-04-11 DIAGNOSIS — H04123 Dry eye syndrome of bilateral lacrimal glands: Secondary | ICD-10-CM | POA: Diagnosis not present

## 2019-04-21 DIAGNOSIS — K648 Other hemorrhoids: Secondary | ICD-10-CM | POA: Diagnosis not present

## 2019-04-21 DIAGNOSIS — R1013 Epigastric pain: Secondary | ICD-10-CM | POA: Diagnosis not present

## 2019-04-21 DIAGNOSIS — K219 Gastro-esophageal reflux disease without esophagitis: Secondary | ICD-10-CM | POA: Diagnosis not present

## 2019-05-02 DIAGNOSIS — H2512 Age-related nuclear cataract, left eye: Secondary | ICD-10-CM | POA: Diagnosis not present

## 2019-05-09 DIAGNOSIS — E7849 Other hyperlipidemia: Secondary | ICD-10-CM | POA: Diagnosis not present

## 2019-05-10 DIAGNOSIS — R82998 Other abnormal findings in urine: Secondary | ICD-10-CM | POA: Diagnosis not present

## 2019-05-10 DIAGNOSIS — I10 Essential (primary) hypertension: Secondary | ICD-10-CM | POA: Diagnosis not present

## 2019-05-12 DIAGNOSIS — Z1212 Encounter for screening for malignant neoplasm of rectum: Secondary | ICD-10-CM | POA: Diagnosis not present

## 2019-05-16 DIAGNOSIS — F0390 Unspecified dementia without behavioral disturbance: Secondary | ICD-10-CM | POA: Diagnosis not present

## 2019-05-16 DIAGNOSIS — Z0000 Encounter for general adult medical examination without abnormal findings: Secondary | ICD-10-CM | POA: Diagnosis not present

## 2019-05-16 DIAGNOSIS — I10 Essential (primary) hypertension: Secondary | ICD-10-CM | POA: Diagnosis not present

## 2019-05-16 DIAGNOSIS — R195 Other fecal abnormalities: Secondary | ICD-10-CM | POA: Diagnosis not present

## 2019-05-16 DIAGNOSIS — K219 Gastro-esophageal reflux disease without esophagitis: Secondary | ICD-10-CM | POA: Diagnosis not present

## 2019-05-16 DIAGNOSIS — H409 Unspecified glaucoma: Secondary | ICD-10-CM | POA: Diagnosis not present

## 2019-05-16 DIAGNOSIS — Z1331 Encounter for screening for depression: Secondary | ICD-10-CM | POA: Diagnosis not present

## 2019-05-16 DIAGNOSIS — E785 Hyperlipidemia, unspecified: Secondary | ICD-10-CM | POA: Diagnosis not present

## 2019-05-16 DIAGNOSIS — H9190 Unspecified hearing loss, unspecified ear: Secondary | ICD-10-CM | POA: Diagnosis not present

## 2019-05-16 DIAGNOSIS — C61 Malignant neoplasm of prostate: Secondary | ICD-10-CM | POA: Diagnosis not present

## 2019-06-02 DIAGNOSIS — H903 Sensorineural hearing loss, bilateral: Secondary | ICD-10-CM | POA: Diagnosis not present

## 2019-06-02 DIAGNOSIS — Z570 Occupational exposure to noise: Secondary | ICD-10-CM | POA: Diagnosis not present

## 2019-06-03 ENCOUNTER — Ambulatory Visit: Payer: PPO | Attending: Internal Medicine

## 2019-06-03 DIAGNOSIS — Z23 Encounter for immunization: Secondary | ICD-10-CM | POA: Insufficient documentation

## 2019-06-03 NOTE — Progress Notes (Signed)
   Covid-19 Vaccination Clinic  Name:  Nicholas Daniels    MRN: [id number] DOB: February 01, 1951  06/03/2019  [name] was observed post Covid-19 immunization for 15 minutes without incidence. He was provided with Vaccine Information Sheet and instruction to access the V-Safe system.   [name] was instructed to call 911 with any severe reactions post vaccine: Marland Kitchen Difficulty breathing  . Swelling of your face and throat  . A fast heartbeat  . A bad rash all over your body  . Dizziness and weakness    Immunizations Administered    Name Date Dose VIS Date Route   Pfizer COVID-19 Vaccine 06/03/2019  1:49 PM 0.3 mL 03/18/2019 Intramuscular   Manufacturer: [place]   Lot: [id number]   [place]: [id number]

## 2019-06-28 ENCOUNTER — Encounter: Payer: Self-pay | Admitting: Gastroenterology

## 2019-06-28 ENCOUNTER — Other Ambulatory Visit: Payer: Self-pay

## 2019-06-28 ENCOUNTER — Ambulatory Visit: Payer: PPO | Admitting: Gastroenterology

## 2019-06-28 VITALS — BP 140/72 | HR 68 | Temp 98.2°F | Ht 67.0 in | Wt 202.2 lb

## 2019-06-28 DIAGNOSIS — R1013 Epigastric pain: Secondary | ICD-10-CM | POA: Diagnosis not present

## 2019-06-28 DIAGNOSIS — R195 Other fecal abnormalities: Secondary | ICD-10-CM | POA: Diagnosis not present

## 2019-06-28 DIAGNOSIS — K5900 Constipation, unspecified: Secondary | ICD-10-CM

## 2019-06-28 NOTE — Progress Notes (Signed)
Nicholas Daniels    [id number]    04-07-1951  Primary Care Physician:[name], [name], MD  Referring Physician: Leanna Battles, MD [address] [place],  [place] [address]   Chief complaint: Positive fecal Hemoccult, BRBPR HPI: 69 year old male with history of prostate cancer 2009 s/p prostate surgery and radiation in 2011 here to discuss further evaluation of positive fecal Hemoccult  He is accompanied by his wife Santiago Glad, she recently underwent lung transplant almost a year ago.  Denies any overt GI bleeding.  No melena or blood per rectum. Denies any abdominal pain, nausea, vomiting, decreased appetite, change in bowel habits or weight loss.  He is on daily oral chemotherapy for prostate cancer, in remission.  Takes daily aspirin 325 mg.  GERD symptoms stable on omeprazole 20 mg daily.  No dysphagia or odynophagia.  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 H.pylori Colonoscopy 03/21/2002: Normal  Cholelithiasis s/p cholecystectomy  Outpatient Encounter Medications as of 06/28/2019  Medication Sig  . aspirin 325 MG tablet Take 325 mg by mouth daily.  . calcium-vitamin D (CALCIUM 500/D) 500-200 MG-UNIT tablet Take 1 tablet by mouth.  . diltiazem (CARTIA XT) 120 MG 24 hr capsule Take 120 mg by mouth daily.  . Enzalutamide (XTANDI PO) Take 4 tablets by mouth daily.  . Flaxseed, Linseed, (FLAXSEED OIL PO) Take 2,800 mg by mouth daily.  Marland Kitchen latanoprost (XALATAN) 0.005 % ophthalmic solution Place 1 drop into both eyes at bedtime.  . Multiple Vitamin (MULTIVITAMIN) tablet Take 1 tablet by mouth daily.  . Niacin CR 250 MG TBCR Take 250 mg by mouth at bedtime.   Marland Kitchen omeprazole (PRILOSEC) 20 MG capsule Take 20 mg by mouth daily.  . Prednisolon-Gatiflox-Bromfenac 1-0.5-0.075 % SUSP Place 1 drop into the left eye 4 (four) times daily.  . timolol (TIMOPTIC) 0.5 %  ophthalmic solution Place 1 drop into both eyes daily.  . Vitamin D, Cholecalciferol, 50 MCG (2000 UT) CAPS Take by mouth.  . [DISCONTINUED] acetaminophen (TYLENOL) 500 MG tablet Take 2 tablets (1,000 mg total) by mouth every 6 (six) hours as needed for mild pain or fever.  . [DISCONTINUED] sildenafil (REVATIO) 20 MG tablet Take 20 mg by mouth daily as needed (for erectile dysfunction).    No facility-administered encounter medications on file as of 06/28/2019.    Allergies as of 06/28/2019  . (No Known Allergies)    Past Medical History:  Diagnosis Date  . Cough   . Glaucoma   . Hypertension   . PFO (patent foramen ovale)    PFO noted on coronary CT 09/05/14  . Prostate cancer ([facility]) 2009    Past Surgical History:  Procedure Laterality Date  . CHOLECYSTECTOMY    . PROSTATE SURGERY  2009  . radiation treatment    . RESECTION OF MEDIASTINAL MASS N/A 02/26/2015   Procedure: RESECTION OF MEDIASTINAL MASS;  Surgeon: Ivin Poot, MD;  Location: [place];  Service: Thoracic;  Laterality: N/A;  . STERNOTOMY N/A 02/26/2015   Procedure: STERNOTOMY;  Surgeon: Ivin Poot, MD;  Location: [facility] OR;  Service: Thoracic;  Laterality: N/A;    Family History  Problem Relation Age of Onset  . Heart disease Brother   . COPD Brother   . Cancer Brother        liver  . Liver cancer Brother   . Heart disease Mother  MI  . COPD Mother   . Pneumonia Mother   . Diabetes Mother   . COPD Father     Social History   Socioeconomic History  . Marital status: Married    Spouse name: Not on file  . Number of children: Not on file  . Years of education: Not on file  . Highest education level: Not on file  Occupational History  . Not on file  Tobacco Use  . Smoking status: Never Smoker  . Smokeless tobacco: Never Used  Substance and Sexual Activity  . Alcohol use: No  . Drug use: No  . Sexual activity: Not on file  Other Topics Concern  . Not on file  Social History Narrative  .  Not on file   Social Determinants of Health   Financial Resource Strain:   . Difficulty of Paying Living Expenses:   Food Insecurity:   . Worried About Charity fundraiser in the Last Year:   . Arboriculturist in the Last Year:   Transportation Needs:   . Film/video editor (Medical):   Marland Kitchen Lack of Transportation (Non-Medical):   Physical Activity:   . Days of Exercise per Week:   . Minutes of Exercise per Session:   Stress:   . Feeling of Stress :   Social Connections:   . Frequency of Communication with Friends and Family:   . Frequency of Social Gatherings with Friends and Family:   . Attends Religious Services:   . Active Member of Clubs or Organizations:   . Attends Archivist Meetings:   Marland Kitchen Marital Status:   Intimate Partner Violence:   . Fear of Current or Ex-Partner:   . Emotionally Abused:   Marland Kitchen Physically Abused:   . Sexually Abused:       Review of systems: All other review of systems negative except as mentioned in the HPI.   Physical Exam: Vitals:   06/28/19 1425  BP: 140/72  Pulse: 68  Temp: 98.2 F (36.8 C)   Body mass index is 31.67 kg/m. Gen:      No acute distress Abd:      + bowel sounds; soft, non-tender; no palpable masses, no distension Neuro: alert and oriented x 3 Psych: normal mood and affect  Data Reviewed:  Reviewed labs, radiology imaging, old records and pertinent past GI work up   Assessment and Plan/Recommendations:  69 year old male with history of prostate cancer s/p radiation here for further evaluation of positive fecal Hemoccult.  Differential includes radiation proctitis, small volume hemorrhage from hemorrhoids, esophagitis, gastritis or peptic ulcer disease.  Intermittent epigastric discomfort. He has history of gastritis and gastroduodenitis.  He is on high-dose aspirin Continue omeprazole  Chronic idiopathic constipation: Start Benefiber 1 tablespoon 3 times daily with meals. Increase dietary fiber  and fluid intake If continues to have persistent symptoms, will consider starting low-dose laxative.  Will plan to schedule EGD and colonoscopy for further evaluation The risks and benefits as well as alternatives of endoscopic procedure(s) have been discussed and reviewed. All questions answered. The patient agrees to proceed.    The patient was provided an opportunity to ask questions and all were answered. The patient agreed with the plan and demonstrated an understanding of the instructions.  Damaris Hippo , MD    CC: Leanna Battles, MD

## 2019-06-28 NOTE — Patient Instructions (Signed)
If you are age 70 or older, your body mass index should be between 23-30. Your Body mass index is 31.67 kg/m. If this is out of the aforementioned range listed, please consider follow up with your Primary Care Provider.  You have been scheduled for an endoscopy and colonoscopy. Please follow the written instructions given to you at your visit today. Please pick up your prep supplies at the pharmacy within the next 1-3 days. If you use inhalers (even only as needed), please bring them with you on the day of your procedure.  Start Benefiber 1 tablespoon 2-3 times daily.   Try to drink at least 8 cups of water daily.   You have been given a sample of Plenvu to use for preparation for colonoscopy.   Due to recent changes in healthcare laws, you may see the results of your imaging and laboratory studies on MyChart before your provider has had a chance to review them.  We understand that in some cases there may be results that are confusing or concerning to you. Not all laboratory results come back in the same time frame and the provider may be waiting for multiple results in order to interpret others.  Please give [place] 48 hours in order for your provider to thoroughly review all the results before contacting the office for clarification of your results.   Thank you for choosing me and [facility].  Dr. Silverio Decamp

## 2019-06-29 ENCOUNTER — Ambulatory Visit: Payer: PPO | Attending: Internal Medicine

## 2019-06-29 DIAGNOSIS — Z23 Encounter for immunization: Secondary | ICD-10-CM

## 2019-06-29 NOTE — Progress Notes (Signed)
   Covid-19 Vaccination Clinic  Name:  Nicholas Daniels    MRN: [id number] DOB: 1951/03/16  06/29/2019  [name] was observed post Covid-19 immunization for 15 minutes without incident. He was provided with Vaccine Information Sheet and instruction to access the V-Safe system.   [name] was instructed to call 911 with any severe reactions post vaccine: Marland Kitchen Difficulty breathing  . Swelling of face and throat  . A fast heartbeat  . A bad rash all over body  . Dizziness and weakness   Immunizations Administered    Name Date Dose VIS Date Route   Pfizer COVID-19 Vaccine 06/29/2019  1:52 PM 0.3 mL 03/18/2019 Intramuscular   Manufacturer: [place]   Lot: [id number]   [place]: [id number]

## 2019-06-30 ENCOUNTER — Encounter: Payer: Self-pay | Admitting: Gastroenterology

## 2019-07-04 DIAGNOSIS — H2511 Age-related nuclear cataract, right eye: Secondary | ICD-10-CM | POA: Diagnosis not present

## 2019-07-04 DIAGNOSIS — H35371 Puckering of macula, right eye: Secondary | ICD-10-CM | POA: Diagnosis not present

## 2019-07-04 DIAGNOSIS — H35372 Puckering of macula, left eye: Secondary | ICD-10-CM | POA: Diagnosis not present

## 2019-07-14 DIAGNOSIS — H2511 Age-related nuclear cataract, right eye: Secondary | ICD-10-CM | POA: Diagnosis not present

## 2019-07-20 DIAGNOSIS — C61 Malignant neoplasm of prostate: Secondary | ICD-10-CM | POA: Diagnosis not present

## 2019-07-20 DIAGNOSIS — K648 Other hemorrhoids: Secondary | ICD-10-CM | POA: Diagnosis not present

## 2019-07-20 DIAGNOSIS — R196 Halitosis: Secondary | ICD-10-CM | POA: Diagnosis not present

## 2019-07-20 DIAGNOSIS — B370 Candidal stomatitis: Secondary | ICD-10-CM | POA: Diagnosis not present

## 2019-07-22 ENCOUNTER — Encounter: Payer: Self-pay | Admitting: Gastroenterology

## 2019-07-29 ENCOUNTER — Telehealth: Payer: Self-pay | Admitting: Gastroenterology

## 2019-07-29 NOTE — Telephone Encounter (Signed)
Spoke with the patient. He has called his PCP about this problem. He wants to know from [place], if he can have his procedures. Advised that this will not cause his procedure to be canceled. Reports "green tongue, bad breath and taste, some bleeding when he brushes his teeth."  No recent antibiotics. No oral stinging or burning. He is waiting for a call back from PCP. He will plan for the endoscopy procedures as scheduled. Wanted you to be aware.

## 2019-07-29 NOTE — Telephone Encounter (Signed)
Patient's wife called states the patient has developed thrush and would like to know if it is ok to proceed with procedure scheduled.

## 2019-07-29 NOTE — Telephone Encounter (Signed)
ok 

## 2019-08-02 ENCOUNTER — Other Ambulatory Visit: Payer: Self-pay

## 2019-08-02 ENCOUNTER — Encounter: Payer: Self-pay | Admitting: Gastroenterology

## 2019-08-02 ENCOUNTER — Other Ambulatory Visit: Payer: Self-pay | Admitting: *Deleted

## 2019-08-02 ENCOUNTER — Ambulatory Visit (AMBULATORY_SURGERY_CENTER): Payer: PPO | Admitting: Gastroenterology

## 2019-08-02 VITALS — BP 149/82 | HR 80 | Temp 97.3°F | Resp 14 | Ht 67.0 in | Wt 202.0 lb

## 2019-08-02 DIAGNOSIS — K3189 Other diseases of stomach and duodenum: Secondary | ICD-10-CM

## 2019-08-02 DIAGNOSIS — K2970 Gastritis, unspecified, without bleeding: Secondary | ICD-10-CM

## 2019-08-02 DIAGNOSIS — K5790 Diverticulosis of intestine, part unspecified, without perforation or abscess without bleeding: Secondary | ICD-10-CM | POA: Diagnosis not present

## 2019-08-02 DIAGNOSIS — K635 Polyp of colon: Secondary | ICD-10-CM | POA: Diagnosis not present

## 2019-08-02 DIAGNOSIS — K648 Other hemorrhoids: Secondary | ICD-10-CM | POA: Diagnosis not present

## 2019-08-02 DIAGNOSIS — R195 Other fecal abnormalities: Secondary | ICD-10-CM | POA: Diagnosis not present

## 2019-08-02 DIAGNOSIS — K5900 Constipation, unspecified: Secondary | ICD-10-CM | POA: Diagnosis not present

## 2019-08-02 DIAGNOSIS — K259 Gastric ulcer, unspecified as acute or chronic, without hemorrhage or perforation: Secondary | ICD-10-CM

## 2019-08-02 DIAGNOSIS — R1013 Epigastric pain: Secondary | ICD-10-CM | POA: Diagnosis not present

## 2019-08-02 DIAGNOSIS — D124 Benign neoplasm of descending colon: Secondary | ICD-10-CM

## 2019-08-02 MED ORDER — SODIUM CHLORIDE 0.9 % IV SOLN: 500.0000 mL | Freq: Once | INTRAVENOUS | Status: DC

## 2019-08-02 MED ORDER — OMEPRAZOLE 40 MG PO CPDR: 40.0000 mg | DELAYED_RELEASE_CAPSULE | Freq: Every day | ORAL | 0 refills | Status: DC

## 2019-08-02 NOTE — Op Note (Signed)
[facility] Patient Name: Nicholas Daniels Procedure Date: 08/02/2019 7:28 AM MRN: [id number] Endoscopist: Mauri Pole , MD Age: 69 Referring MD:  Date of Birth: Jun 10, 1950 Gender: Male Account #: [id number] Procedure:                Upper GI endoscopy Indications:              Gastrointestinal bleeding of unknown origin (fecal                            heme occult positive), Epigastric abdominal pain Medicines:                Monitored Anesthesia Care Procedure:                Pre-Anesthesia Assessment:                           - Prior to the procedure, a History and Physical                            was performed, and patient medications and                            allergies were reviewed. The patient's tolerance of                            previous anesthesia was also reviewed. The risks                            and benefits of the procedure and the sedation                            options and risks were discussed with the patient.                            All questions were answered, and informed consent                            was obtained. Prior Anticoagulants: The patient has                            taken no previous anticoagulant or antiplatelet                            agents. ASA Grade Assessment: II - A patient with                            mild systemic disease. After reviewing the risks                            and benefits, the patient was deemed in                            satisfactory condition to undergo the procedure.  After obtaining informed consent, the endoscope was                            passed under direct vision. Throughout the                            procedure, the patient's blood pressure, pulse, and                            oxygen saturations were monitored continuously. The                            Endoscope was introduced through the mouth, and   advanced to the second part of duodenum. The upper                            GI endoscopy was accomplished without difficulty.                            The patient tolerated the procedure well. Scope In: Scope Out: Findings:                 The Z-line was regular and was found 38 cm from the                            incisors.                           No gross lesions were noted in the entire esophagus.                           The gastroesophageal flap valve was visualized                            endoscopically and classified as Hill Grade IV (no                            fold, wide open lumen, hiatal hernia present).                           Few cratered gastric ulcers with no stigmata of                            bleeding were found in the gastric antrum and in                            the prepyloric region of the stomach. The largest                            lesion was 4 mm in largest dimension. Biopsies were                            taken with a cold forceps for histology.  Scattered mild inflammation characterized by                            congestion (edema) and erythema was found in the                            entire examined stomach. Biopsies were taken with a                            cold forceps for Helicobacter pylori testing.                           The examined duodenum was normal. Complications:            No immediate complications. Estimated Blood Loss:     Estimated blood loss was minimal. Impression:               - Z-line regular, 38 cm from the incisors.                           - No gross lesions in esophagus.                           - Gastroesophageal flap valve classified as Hill                            Grade IV (no fold, wide open lumen, hiatal hernia                            present).                           - Gastric ulcers with no stigmata of bleeding.                            Biopsied.                            - Gastritis. Biopsied.                           - Normal examined duodenum. Recommendation:           - Patient has a contact number available for                            emergencies. The signs and symptoms of potential                            delayed complications were discussed with the                            patient. Return to normal activities tomorrow.                            Written discharge instructions were provided to the  patient.                           - Resume previous diet.                           - Continue present medications.                           - Await pathology results.                           - No ibuprofen, naproxen, or other non-steroidal                            anti-inflammatory drugs.                           - Use Prilosec (omeprazole) 40 mg PO daily for 3                            months.                           - Follow an antireflux regimen indefinitely. Mauri Pole, MD 08/02/2019 8:34:28 AM This report has been signed electronically.

## 2019-08-02 NOTE — Op Note (Signed)
[facility] Patient Name: Nicholas Daniels Procedure Date: 08/02/2019 7:28 AM MRN: [id number] Endoscopist: Mauri Pole , MD Age: 69 Referring MD:  Date of Birth: 10-20-1950 Gender: Male Account #: [id number] Procedure:                Colonoscopy Indications:              Evaluation of unexplained GI bleeding presenting                            with fecal occult blood Medicines:                Monitored Anesthesia Care Procedure:                Pre-Anesthesia Assessment:                           - Prior to the procedure, a History and Physical                            was performed, and patient medications and                            allergies were reviewed. The patient's tolerance of                            previous anesthesia was also reviewed. The risks                            and benefits of the procedure and the sedation                            options and risks were discussed with the patient.                            All questions were answered, and informed consent                            was obtained. Prior Anticoagulants: The patient has                            taken no previous anticoagulant or antiplatelet                            agents. ASA Grade Assessment: II - A patient with                            mild systemic disease. After reviewing the risks                            and benefits, the patient was deemed in                            satisfactory condition to undergo the procedure.  After obtaining informed consent, the colonoscope                            was passed under direct vision. Throughout the                            procedure, the patient's blood pressure, pulse, and                            oxygen saturations were monitored continuously. The                            Colonoscope was introduced through the anus and                            advanced to the the terminal ileum,  with                            identification of the appendiceal orifice and IC                            valve. The colonoscopy was performed without                            difficulty. The patient tolerated the procedure                            well. The quality of the bowel preparation was                            excellent. The terminal ileum, ileocecal valve,                            appendiceal orifice, and rectum were photographed. Scope In: 8:13:41 AM Scope Out: 8:23:45 AM Scope Withdrawal Time: 0 hours 8 minutes 11 seconds  Total Procedure Duration: 0 hours 10 minutes 4 seconds  Findings:                 The perianal and digital rectal examinations were                            normal.                           The terminal ileum contained a single less than 1                            mm diverticulum.                           A 1 mm polyp was found in the descending colon. The                            polyp was sessile. The polyp was removed with a  cold biopsy forceps. Resection and retrieval were                            complete.                           A few small-mouthed diverticula were found in the                            sigmoid colon.                           Non-bleeding external and internal hemorrhoids were                            found during retroflexion. The hemorrhoids were                            medium-sized.                           The exam was otherwise without abnormality. Complications:            No immediate complications. Estimated Blood Loss:     Estimated blood loss was minimal. Impression:               - Ileal diverticulum.                           - One 1 mm polyp in the descending colon, removed                            with a cold biopsy forceps. Resected and retrieved.                           - Diverticulosis in the sigmoid colon.                           - Non-bleeding external  and internal hemorrhoids.                           - The examination was otherwise normal. Recommendation:           - Patient has a contact number available for                            emergencies. The signs and symptoms of potential                            delayed complications were discussed with the                            patient. Return to normal activities tomorrow.                            Written discharge instructions were provided to the  patient.                           - Resume previous diet.                           - Continue present medications.                           - Await pathology results.                           - Repeat colonoscopy in 5-10 years for surveillance                            based on pathology results. Mauri Pole, MD 08/02/2019 8:40:11 AM This report has been signed electronically.

## 2019-08-02 NOTE — Progress Notes (Signed)
Called to room to assist during endoscopic procedure.  Patient ID and intended procedure confirmed with present staff. Received instructions for my participation in the procedure from the performing physician.  

## 2019-08-02 NOTE — Patient Instructions (Addendum)
Handouts Provided:  Polyps, Hemorrhoids and Diverticulosis  INCREASE Prilosec to 40 mg Daily for 3 months  YOU HAD AN ENDOSCOPIC PROCEDURE TODAY AT [facility]:   Refer to the procedure report that was given to you for any specific questions about what was found during the examination.  If the procedure report does not answer your questions, please call your gastroenterologist to clarify.  If you requested that your care partner not be given the details of your procedure findings, then the procedure report has been included in a sealed envelope for you to review at your convenience later.  YOU SHOULD EXPECT: Some feelings of bloating in the abdomen. Passage of more gas than usual.  Walking can help get rid of the air that was put into your GI tract during the procedure and reduce the bloating. If you had a lower endoscopy (such as a colonoscopy or flexible sigmoidoscopy) you may notice spotting of blood in your stool or on the toilet paper. If you underwent a bowel prep for your procedure, you may not have a normal bowel movement for a few days.  Please Note:  You might notice some irritation and congestion in your nose or some drainage.  This is from the oxygen used during your procedure.  There is no need for concern and it should clear up in a day or so.  SYMPTOMS TO REPORT IMMEDIATELY:   Following lower endoscopy (colonoscopy or flexible sigmoidoscopy):  Excessive amounts of blood in the stool  Significant tenderness or worsening of abdominal pains  Swelling of the abdomen that is new, acute  Fever of 100F or higher   Following upper endoscopy (EGD)  Vomiting of blood or coffee ground material  New chest pain or pain under the shoulder blades  Painful or persistently difficult swallowing  New shortness of breath  Fever of 100F or higher  Black, tarry-looking stools  For urgent or emergent issues, a gastroenterologist can be reached at any hour by calling [phone]  [phone]. Do not use MyChart messaging for urgent concerns.    DIET:  We do recommend a small meal at first, but then you may proceed to your regular diet.  Drink plenty of fluids but you should avoid alcoholic beverages for 24 hours.  ACTIVITY:  You should plan to take it easy for the rest of today and you should NOT DRIVE or use heavy machinery until tomorrow (because of the sedation medicines used during the test).    FOLLOW UP: Our staff will call the number listed on your records 48-72 hours following your procedure to check on you and address any questions or concerns that you may have regarding the information given to you following your procedure. If we do not reach you, we will leave a message.  We will attempt to reach you two times.  During this call, we will ask if you have developed any symptoms of COVID 19. If you develop any symptoms (ie: fever, flu-like symptoms, shortness of breath, cough etc.) before then, please call [phone].  If you test positive for Covid 19 in the 2 weeks post procedure, please call and report this information to [place].    If any biopsies were taken you will be contacted by phone or by letter within the next 1-3 weeks.  Please call us at [phone] if you have not heard about the biopsies in 3 weeks.    SIGNATURES/CONFIDENTIALITY: You and/or your care partner have signed paperwork which will be entered  into your electronic medical record.  These signatures attest to the fact that that the information above on your After Visit Summary has been reviewed and is understood.  Full responsibility of the confidentiality of this discharge information lies with you and/or your care-partner.

## 2019-08-02 NOTE — Progress Notes (Signed)
pt tolerated well. VSS. awake and to recovery. Report given to RN.  

## 2019-08-02 NOTE — Progress Notes (Signed)
Pt's states no medical or surgical changes since previsit or office visit. 

## 2019-08-04 ENCOUNTER — Telehealth: Payer: Self-pay

## 2019-08-04 NOTE — Telephone Encounter (Signed)
  Follow up Call-  Call back number 08/02/2019  Post procedure Call Back phone  # [phone]  Permission to leave phone message Yes  Some recent data might be hidden     Patient questions:  Do you have a fever, pain , or abdominal swelling? No. Pain Score  0 *  Have you tolerated food without any problems? Yes.    Have you been able to return to your normal activities? Yes.    Do you have any questions about your discharge instructions: Diet   No. Medications  No. Follow up visit  No.  Do you have questions or concerns about your Care? No.  Actions: * If pain score is 4 or above: No action needed, pain <4.  1. Have you developed a fever since your procedure? no  2.   Have you had an respiratory symptoms (SOB or cough) since your procedure? no  3.   Have you tested positive for COVID 19 since your procedure no  4.   Have you had any family members/close contacts diagnosed with the COVID 19 since your procedure?  no   If yes to any of these questions please route to Joylene [name], RN and Erenest Rasher, RN

## 2019-08-09 ENCOUNTER — Encounter: Payer: Self-pay | Admitting: Gastroenterology

## 2019-08-11 DIAGNOSIS — C61 Malignant neoplasm of prostate: Secondary | ICD-10-CM | POA: Diagnosis not present

## 2019-08-16 DIAGNOSIS — Z961 Presence of intraocular lens: Secondary | ICD-10-CM | POA: Diagnosis not present

## 2019-10-07 DIAGNOSIS — Z85828 Personal history of other malignant neoplasm of skin: Secondary | ICD-10-CM | POA: Diagnosis not present

## 2019-10-07 DIAGNOSIS — L570 Actinic keratosis: Secondary | ICD-10-CM | POA: Diagnosis not present

## 2019-10-07 DIAGNOSIS — B078 Other viral warts: Secondary | ICD-10-CM | POA: Diagnosis not present

## 2019-10-17 ENCOUNTER — Ambulatory Visit (INDEPENDENT_AMBULATORY_CARE_PROVIDER_SITE_OTHER): Payer: PPO | Admitting: Ophthalmology

## 2019-10-17 ENCOUNTER — Other Ambulatory Visit: Payer: Self-pay

## 2019-10-17 ENCOUNTER — Encounter (INDEPENDENT_AMBULATORY_CARE_PROVIDER_SITE_OTHER): Payer: Self-pay | Admitting: Ophthalmology

## 2019-10-17 DIAGNOSIS — H35372 Puckering of macula, left eye: Secondary | ICD-10-CM | POA: Insufficient documentation

## 2019-10-17 DIAGNOSIS — H35371 Puckering of macula, right eye: Secondary | ICD-10-CM | POA: Diagnosis not present

## 2019-10-17 DIAGNOSIS — H353131 Nonexudative age-related macular degeneration, bilateral, early dry stage: Secondary | ICD-10-CM | POA: Diagnosis not present

## 2019-10-17 NOTE — Progress Notes (Signed)
10/17/2019     CHIEF COMPLAINT Patient presents for Retina Follow Up   HISTORY OF PRESENT ILLNESS: Nicholas Daniels is a 69 y.o. male who presents to the clinic today for:   HPI    Retina Follow Up    Patient presents with  Other.  In both eyes.  This started 3 months ago.  Duration of 3 months.  Since onset it is stable.          Comments    3 month f/u dilated exam and OCT(MAC) today.  Pt denies noticeable changes to [place] OU since last visit. Pt denies ocular pain, flashes of light, or floaters OU.         Last edited by Melburn Popper, COA on 10/17/2019  2:00 PM. (History)      Referring physician: Leanna Battles, MD [place],  [place] [address]  HISTORICAL INFORMATION:   Selected notes from the MEDICAL RECORD NUMBER       CURRENT MEDICATIONS: Current Outpatient Medications (Ophthalmic Drugs)  Medication Sig   latanoprost (XALATAN) 0.005 % ophthalmic solution Place 1 drop into both eyes at bedtime.   Prednisolon-Gatiflox-Bromfenac 1-0.5-0.075 % SUSP Place 1 drop into the left eye 4 (four) times daily.   timolol (TIMOPTIC) 0.5 % ophthalmic solution Place 1 drop into both eyes daily.   No current facility-administered medications for this visit. (Ophthalmic Drugs)   Current Outpatient Medications (Other)  Medication Sig   aspirin 325 MG tablet Take 325 mg by mouth daily.   calcium-vitamin D (CALCIUM 500/D) 500-200 MG-UNIT tablet Take 1 tablet by mouth.   diltiazem (CARTIA XT) 120 MG 24 hr capsule Take 120 mg by mouth daily.   Enzalutamide (XTANDI PO) Take 4 tablets by mouth daily.   Flaxseed, Linseed, (FLAXSEED OIL PO) Take 2,800 mg by mouth daily.   Multiple Vitamin (MULTIVITAMIN) tablet Take 1 tablet by mouth daily.   Niacin CR 250 MG TBCR Take 250 mg by mouth at bedtime.    omeprazole (PRILOSEC) 40 MG capsule Take 1 capsule (40 mg total) by mouth daily.   Vitamin D, Cholecalciferol, 50 MCG (2000 UT) CAPS Take by mouth.   No  current facility-administered medications for this visit. (Other)      REVIEW OF SYSTEMS:    ALLERGIES No Known Allergies  PAST MEDICAL HISTORY Past Medical History:  Diagnosis Date   Cough    Glaucoma    Hypertension    PFO (patent foramen ovale)    PFO noted on coronary CT 09/05/14   Prostate cancer ([place]) 2009   Past Surgical History:  Procedure Laterality Date   CATARACT EXTRACTION Right    CHOLECYSTECTOMY     PROSTATE SURGERY  2009   radiation treatment     RESECTION OF MEDIASTINAL MASS N/A 02/26/2015   Procedure: RESECTION OF MEDIASTINAL MASS;  Surgeon: Ivin Poot, MD;  Location: [facility];  Service: Thoracic;  Laterality: N/A;   STERNOTOMY N/A 02/26/2015   Procedure: STERNOTOMY;  Surgeon: Ivin Poot, MD;  Location[facility];  Service: Thoracic;  Laterality: N/A;    FAMILY HISTORY Family History  Problem Relation Age of Onset   Heart disease Brother    COPD Brother    Cancer Brother        liver   Liver cancer Brother    Heart disease Mother        MI   COPD Mother    Pneumonia Mother    Diabetes Mother  COPD Father    Colon polyps Neg Hx    Colon cancer Neg Hx     SOCIAL HISTORY Social History   Tobacco Use   Smoking status: Never Smoker   Smokeless tobacco: Never Used  Vaping Use   Vaping Use: Never used  Substance Use Topics   Alcohol use: No   Drug use: No         OPHTHALMIC EXAM:  Base Eye Exam    Visual Acuity (ETDRS)      Right Left   Dist [place] 20/20 20/25 -2       Tonometry (Tonopen, 2:07 PM)      Right Left   Pressure 11 8       Pupils      Pupils Dark Light Shape React APD   Right PERRL 4 3 Round Slow None   Left PERRL 4 3 Round Slow None       Visual Fields (Counting fingers)      Left Right    Full Full       Extraocular Movement      Right Left    Full Full       Neuro/Psych    Oriented x3: Yes   Mood/Affect: Normal       Dilation    Both eyes: 1.0% Mydriacyl, 2.5%  Phenylephrine @ 2:07 PM        Slit Lamp and Fundus Exam    External Exam      Right Left   External Normal Normal       Slit Lamp Exam      Right Left   Lids/Lashes Normal Normal   Conjunctiva/Sclera White and quiet White and quiet   Cornea Clear Clear   Anterior Chamber Deep and quiet Deep and quiet   Iris Round and reactive Round and reactive   Lens Posterior chamber intraocular lens Posterior chamber intraocular lens   Anterior Vitreous Normal Normal       Fundus Exam      Right Left   Posterior Vitreous Normal Normal   Disc Normal Normal   C/D Ratio 0.25 0.3   Macula Normal Epiretinal membrane   Vessels Normal Normal   Periphery Normal Normal          IMAGING AND PROCEDURES  Imaging and Procedures for 10/17/19  OCT, Retina - OU - Both Eyes       Right Eye Quality was good. Scan locations included subfoveal. Central Foveal Thickness: 287. Findings include epiretinal membrane.   Left Eye Quality was good. Scan locations included subfoveal. Central Foveal Thickness: 441. Findings include epiretinal membrane.   Notes Minor epiretinal membrane with no topographic distortion OD, temporal to fovea.  OS with overall stable appearance and retinal thickening of the left eye from epiretinal membrane, what is new however is a small outer retinal nodule of subretinal debris, this could be a change in the photoreceptor layer.                ASSESSMENT/PLAN:  Left epiretinal membrane The nature of macular pucker (epiretinal membrane ERM) was discussed with the patient as well as threshold criteria for vitrectomy surgery. I explained that in rare cases another surgery is needed to actually remove a second wrinkle should it regrow.  Most often, the epiretinal membrane and underlying wrinkled internal limiting membrane are removed with the first surgery, to accomplish the goals.   If the operative eye is Phakic (natural lens still present), cataract surgery is often  recommended prior to Vitrectomy. This will enable the retina surgeon to have the best view during surgery and the patient to obtain optimal results in the future. Treatment options were discussed.  OS, no overall change in retinal thickening.  Over the last 7 months a small outer retinal nodular hyper reflective material change at the level of the photoreceptor layer is present.  This shall be monitored to see if this will progress and potentially affect the visual acuity corrected and self monitoring at home on a monocular basis particularly with near vision aids used in the left eye alone to look for differences from the right eye      ICD-10-CM   1. Left epiretinal membrane  H35.372 OCT, Retina - OU - Both Eyes  2. Right epiretinal membrane  H35.371 OCT, Retina - OU - Both Eyes  3. Early stage nonexudative age-related macular degeneration of both eyes  H35.3131 OCT, Retina - OU - Both Eyes    1.  We will continue to monitor for macular changes secondary to the epiretinal membrane left eye.  2.  There is very delighted with his visual acuity outcome status post cataract extraction with intraocular lens placement 3.  Ophthalmic Meds Ordered this visit:  No orders of the defined types were placed in this encounter.      Return in about 6 months (around 04/18/2020) for DILATE OU, OCT.  There are no Patient Instructions on file for this visit.   Explained the diagnoses, plan, and follow up with the patient and they expressed understanding.  Patient expressed understanding of the importance of proper follow up care.   Clent Demark Sirron Francesconi M.D. Diseases & Surgery of the Retina and Vitreous [facility] [place] 10/17/19     Abbreviations: M myopia (nearsighted); A astigmatism; H hyperopia (farsighted); P presbyopia; Mrx spectacle prescription;  CTL contact lenses; OD right eye; OS left eye; OU both eyes  XT exotropia; ET esotropia; PEK punctate epithelial keratitis; PEE punctate  epithelial erosions; DES dry eye syndrome; MGD meibomian gland dysfunction; ATs artificial tears; PFAT's preservative free artificial tears; [place] nuclear sclerotic cataract; PSC posterior subcapsular cataract; ERM epi-retinal membrane; PVD posterior vitreous detachment; RD retinal detachment; DM diabetes mellitus; DR diabetic retinopathy; NPDR non-proliferative diabetic retinopathy; PDR proliferative diabetic retinopathy; CSME clinically significant macular edema; DME diabetic macular edema; dbh dot blot hemorrhages; CWS cotton wool spot; POAG primary open angle glaucoma; C/D cup-to-disc ratio; HVF humphrey visual field; GVF goldmann visual field; OCT optical coherence tomography; IOP intraocular pressure; BRVO Branch retinal vein occlusion; CRVO central retinal vein occlusion; CRAO central retinal artery occlusion; BRAO branch retinal artery occlusion; RT retinal tear; SB scleral buckle; PPV pars plana vitrectomy; VH Vitreous hemorrhage; PRP panretinal laser photocoagulation; IVK intravitreal kenalog; VMT vitreomacular traction; MH Macular hole;  NVD neovascularization of the disc; NVE neovascularization elsewhere; AREDS age related eye disease study; ARMD age related macular degeneration; POAG primary open angle glaucoma; EBMD epithelial/anterior basement membrane dystrophy; ACIOL anterior chamber intraocular lens; IOL intraocular lens; PCIOL posterior chamber intraocular lens; Phaco/IOL phacoemulsification with intraocular lens placement; [place] photorefractive keratectomy; LASIK laser assisted in situ keratomileusis; HTN hypertension; DM diabetes mellitus; COPD chronic obstructive pulmonary disease

## 2019-10-17 NOTE — Assessment & Plan Note (Signed)
The nature of macular pucker (epiretinal membrane ERM) was discussed with the patient as well as threshold criteria for vitrectomy surgery. I explained that in rare cases another surgery is needed to actually remove a second wrinkle should it regrow.  Most often, the epiretinal membrane and underlying wrinkled internal limiting membrane are removed with the first surgery, to accomplish the goals.   If the operative eye is Phakic (natural lens still present), cataract surgery is often recommended prior to Vitrectomy. This will enable the retina surgeon to have the best view during surgery and the patient to obtain optimal results in the future. Treatment options were discussed.  OS, no overall change in retinal thickening.  Over the last 7 months a small outer retinal nodular hyper reflective material change at the level of the photoreceptor layer is present.  This shall be monitored to see if this will progress and potentially affect the visual acuity corrected and self monitoring at home on a monocular basis particularly with near vision aids used in the left eye alone to look for differences from the right eye

## 2019-10-26 ENCOUNTER — Other Ambulatory Visit: Payer: Self-pay | Admitting: Gastroenterology

## 2019-10-31 DIAGNOSIS — C61 Malignant neoplasm of prostate: Secondary | ICD-10-CM | POA: Diagnosis not present

## 2019-11-01 ENCOUNTER — Ambulatory Visit (INDEPENDENT_AMBULATORY_CARE_PROVIDER_SITE_OTHER): Payer: PPO | Admitting: Gastroenterology

## 2019-11-01 ENCOUNTER — Encounter: Payer: Self-pay | Admitting: Gastroenterology

## 2019-11-01 VITALS — BP 116/70 | HR 69 | Ht 67.0 in | Wt 206.4 lb

## 2019-11-01 DIAGNOSIS — K219 Gastro-esophageal reflux disease without esophagitis: Secondary | ICD-10-CM

## 2019-11-01 DIAGNOSIS — Z8719 Personal history of other diseases of the digestive system: Secondary | ICD-10-CM

## 2019-11-01 DIAGNOSIS — Z8711 Personal history of peptic ulcer disease: Secondary | ICD-10-CM

## 2019-11-01 MED ORDER — OMEPRAZOLE 40 MG PO CPDR: DELAYED_RELEASE_CAPSULE | ORAL | 3 refills | Status: DC

## 2019-11-01 NOTE — Progress Notes (Signed)
Nicholas Daniels    [id number]    12/29/1950  Primary Care Physician:[name], [name], MD  Referring Physician: Leanna Battles, MD [address] [place],  [place] [address]   Chief complaint: History of gastric ulcers HPI:  69 year old very pleasant gentleman here for follow-up visit.  Last office visit in March 2021 for fecal Hemoccult positive.  EGD August 02, 2019 for fecal Hemoccult positive: Severe erosive gastropathy and multiple small gastric antral and prepyloric ulcers.  Biopsies negative for H. pylori Colonoscopy August 02, 2019: Diverticula in terminal ileum.  Pancolonic diverticulosis.  1 mm hyperplastic polyp removed from descending colon and internal hemorrhoids.  He has been taking omeprazole daily.  No longer takes NSAIDs other than aspirin 325 mg daily.  Previously he was taking intermittent ibuprofen for vascular headaches  Denies any nausea, vomiting, abdominal pain, melena or bright red blood per rectum    Outpatient Encounter Medications as of 11/01/2019  Medication Sig  . aspirin 325 MG tablet Take 325 mg by mouth daily.  . calcium-vitamin D (CALCIUM 500/D) 500-200 MG-UNIT tablet Take 1 tablet by mouth.  . diltiazem (CARTIA XT) 120 MG 24 hr capsule Take 120 mg by mouth daily.  . Enzalutamide (XTANDI PO) Take 4 tablets by mouth daily.  . Flaxseed, Linseed, (FLAXSEED OIL PO) Take 2,800 mg by mouth daily.  Marland Kitchen latanoprost (XALATAN) 0.005 % ophthalmic solution Place 1 drop into both eyes at bedtime.  . Multiple Vitamin (MULTIVITAMIN) tablet Take 1 tablet by mouth daily.  . Niacin CR 250 MG TBCR Take 250 mg by mouth at bedtime.   Marland Kitchen omeprazole (PRILOSEC) 40 MG capsule TAKE 1 CAPSULE BY MOUTH EVERY DAY  . Prednisolon-Gatiflox-Bromfenac 1-0.5-0.075 % SUSP Place 1 drop into the left eye 4 (four) times daily.  . timolol (TIMOPTIC) 0.5 % ophthalmic solution Place 1 drop into both eyes daily.  . Vitamin D, Cholecalciferol, 50 MCG (2000 UT) CAPS Take by  mouth.   No facility-administered encounter medications on file as of 11/01/2019.    Allergies as of 11/01/2019  . (No Known Allergies)    Past Medical History:  Diagnosis Date  . Cough   . Glaucoma   . Hypertension   . PFO (patent foramen ovale)    PFO noted on coronary CT 09/05/14  . Prostate cancer ([place]) 2009    Past Surgical History:  Procedure Laterality Date  . CATARACT EXTRACTION Right   . CHOLECYSTECTOMY    . PROSTATE SURGERY  2009  . radiation treatment    . RESECTION OF MEDIASTINAL MASS N/A 02/26/2015   Procedure: RESECTION OF MEDIASTINAL MASS;  Surgeon: Ivin Poot, MD;  Location: [facility];  Service: Thoracic;  Laterality: N/A;  . STERNOTOMY N/A 02/26/2015   Procedure: STERNOTOMY;  Surgeon: Ivin Poot, MD;  [facility];  Service: Thoracic;  Laterality: N/A;    Family History  Problem Relation Age of Onset  . Heart disease Brother   . COPD Brother   . Cancer Brother        liver  . Liver cancer Brother   . Heart disease Mother        MI  . COPD Mother   . Pneumonia Mother   . Diabetes Mother   . COPD Father   . Colon polyps Neg Hx   . Colon cancer Neg Hx     Social History   Socioeconomic History  . Marital status: Married    Spouse name:  Not on file  . Number of children: Not on file  . Years of education: Not on file  . Highest education level: Not on file  Occupational History  . Not on file  Tobacco Use  . Smoking status: Never Smoker  . Smokeless tobacco: Never Used  Vaping Use  . Vaping Use: Never used  Substance and Sexual Activity  . Alcohol use: No  . Drug use: No  . Sexual activity: Not on file  Other Topics Concern  . Not on file  Social History Narrative  . Not on file   Social Determinants of Health   Financial Resource Strain:   . Difficulty of Paying Living Expenses:   Food Insecurity:   . Worried About Charity fundraiser in the Last Year:   . Arboriculturist in the Last Year:   Transportation Needs:   .  Film/video editor (Medical):   Marland Kitchen Lack of Transportation (Non-Medical):   Physical Activity:   . Days of Exercise per Week:   . Minutes of Exercise per Session:   Stress:   . Feeling of Stress :   Social Connections:   . Frequency of Communication with Friends and Family:   . Frequency of Social Gatherings with Friends and Family:   . Attends Religious Services:   . Active Member of Clubs or Organizations:   . Attends Archivist Meetings:   Marland Kitchen Marital Status:   Intimate Partner Violence:   . Fear of Current or Ex-Partner:   . Emotionally Abused:   Marland Kitchen Physically Abused:   . Sexually Abused:       Review of systems: All other review of systems negative except as mentioned in the HPI.   Physical Exam: Vitals:   11/01/19 1336  BP: 116/70  Pulse: 69   Body mass index is 32.32 kg/m. Gen:      No acute distress Neuro: alert and oriented x 3 Psych: normal mood and affect  Data Reviewed:  Reviewed labs, radiology imaging, old records and pertinent past GI work up   Assessment and Plan/Recommendations:  69 year old very pleasant gentleman with history of prostate cancer in 2009 s/p surgery and radiation, multiple gastric ulcers and erosive gastropathy on EGD April for evaluation of Hemoccult positive stool  Gastric biopsies negative for H. Pylori  We will repeat EGD to evaluate and confirm healing of gastric ulcers, repeat biopsies if needed Continue omeprazole 40 mg daily Antireflux measures for GERD  Colorectal cancer screening average risk, due for recall colonoscopy April 2031.  The risks and benefits as well as alternatives of endoscopic procedure(s) have been discussed and reviewed. All questions answered. The patient agrees to proceed.    The patient was provided an opportunity to ask questions and all were answered. The patient agreed with the plan and demonstrated an understanding of the instructions.  Damaris Hippo , MD    CC: Leanna Battles, MD

## 2019-11-01 NOTE — Patient Instructions (Addendum)
You have been scheduled for an endoscopy. Please follow written instructions given to you at your visit today. If you use inhalers (even only as needed), please bring them with you on the day of your procedure.   If you are age 70 or older, your body mass index should be between 23-30. Your Body mass index is 32.32 kg/m. If this is out of the aforementioned range listed, please consider follow up with your Primary Care Provider.  If you are age 3 or younger, your body mass index should be between 19-25. Your Body mass index is 32.32 kg/m. If this is out of the aformentioned range listed, please consider follow up with your Primary Care Provider.    Continue Omeprazole we will send refills to your pharmacy  AVOID NSAIDS  Due to recent changes in healthcare laws, you may see the results of your imaging and laboratory studies on MyChart before your provider has had a chance to review them.  We understand that in some cases there may be results that are confusing or concerning to you. Not all laboratory results come back in the same time frame and the provider may be waiting for multiple results in order to interpret others.  Please give [place] 48 hours in order for your provider to thoroughly review all the results before contacting the office for clarification of your results.    Gastroesophageal Reflux Disease, Adult Gastroesophageal reflux (GER) happens when acid from the stomach flows up into the tube that connects the mouth and the stomach (esophagus). Normally, food travels down the esophagus and stays in the stomach to be digested. However, when a person has GER, food and stomach acid sometimes move back up into the esophagus. If this becomes a more serious problem, the person may be diagnosed with a disease called gastroesophageal reflux disease (GERD). GERD occurs when the reflux:  Happens often.  Causes frequent or severe symptoms.  Causes problems such as damage to the esophagus. When  stomach acid comes in contact with the esophagus, the acid may cause soreness (inflammation) in the esophagus. Over time, GERD may create small holes (ulcers) in the lining of the esophagus. What are the causes? This condition is caused by a problem with the muscle between the esophagus and the stomach (lower esophageal sphincter, or LES). Normally, the LES muscle closes after food passes through the esophagus to the stomach. When the LES is weakened or abnormal, it does not close properly, and that allows food and stomach acid to go back up into the esophagus. The LES can be weakened by certain dietary substances, medicines, and medical conditions, including:  Tobacco use.  Pregnancy.  Having a hiatal hernia.  Alcohol use.  Certain foods and beverages, such as coffee, chocolate, onions, and peppermint. What increases the risk? You are more likely to develop this condition if you:  Have an increased body weight.  Have a connective tissue disorder.  Use NSAID medicines. What are the signs or symptoms? Symptoms of this condition include:  Heartburn.  Difficult or painful swallowing.  The feeling of having a lump in the throat.  Abitter taste in the mouth.  Bad breath.  Having a large amount of saliva.  Having an upset or bloated stomach.  Belching.  Chest pain. Different conditions can cause chest pain. Make sure you see your health care provider if you experience chest pain.  Shortness of breath or wheezing.  Ongoing (chronic) cough or a night-time cough.  Wearing away of tooth enamel.  Weight loss. How is this diagnosed? Your health care provider will take a medical history and perform a physical exam. To determine if you have mild or severe GERD, your health care provider may also monitor how you respond to treatment. You may also have tests, including:  A test to examine your stomach and esophagus with a small camera (endoscopy).  A test thatmeasures the  acidity level in your esophagus.  A test thatmeasures how much pressure is on your esophagus.  A barium swallow or modified barium swallow test to show the shape, size, and functioning of your esophagus. How is this treated? The goal of treatment is to help relieve your symptoms and to prevent complications. Treatment for this condition may vary depending on how severe your symptoms are. Your health care provider may recommend:  Changes to your diet.  Medicine.  Surgery. Follow these instructions at home: Eating and drinking   Follow a diet as recommended by your health care provider. This may involve avoiding foods and drinks such as: ? Coffee and tea (with or without caffeine). ? Drinks that containalcohol. ? Energy drinks and sports drinks. ? Carbonated drinks or sodas. ? Chocolate and cocoa. ? Peppermint and mint flavorings. ? Garlic and onions. ? Horseradish. ? Spicy and acidic foods, including peppers, chili powder, curry powder, vinegar, hot sauces, and barbecue sauce. ? Citrus fruit juices and citrus fruits, such as oranges, lemons, and limes. ? Tomato-based foods, such as red sauce, chili, salsa, and pizza with red sauce. ? Fried and fatty foods, such as donuts, french fries, potato chips, and high-fat dressings. ? High-fat meats, such as hot dogs and fatty cuts of red and white meats, such as rib eye steak, sausage, ham, and bacon. ? High-fat dairy items, such as whole milk, butter, and cream cheese.  Eat small, frequent meals instead of large meals.  Avoid drinking large amounts of liquid with your meals.  Avoid eating meals during the 2-3 hours before bedtime.  Avoid lying down right after you eat.  Do not exercise right after you eat. Lifestyle   Do not use any products that contain nicotine or tobacco, such as cigarettes, e-cigarettes, and chewing tobacco. If you need help quitting, ask your health care provider.  Try to reduce your stress by using  methods such as yoga or meditation. If you need help reducing stress, ask your health care provider.  If you are overweight, reduce your weight to an amount that is healthy for you. Ask your health care provider for guidance about a safe weight loss goal. General instructions  Pay attention to any changes in your symptoms.  Take over-the-counter and prescription medicines only as told by your health care provider. Do not take aspirin, ibuprofen, or other NSAIDs unless your health care provider told you to do so.  Wear loose-fitting clothing. Do not wear anything tight around your waist that causes pressure on your abdomen.  Raise (elevate) the head of your bed about 6 inches (15 cm).  Avoid bending over if this makes your symptoms worse.  Keep all follow-up visits as told by your health care provider. This is important. Contact a health care provider if:  You have: ? New symptoms. ? Unexplained weight loss. ? Difficulty swallowing or it hurts to swallow. ? Wheezing or a persistent cough. ? A hoarse voice.  Your symptoms do not improve with treatment. Get help right away if you:  Have pain in your arms, neck, jaw, teeth, or back.  Feel  sweaty, dizzy, or light-headed.  Have chest pain or shortness of breath.  Vomit and your vomit looks like blood or coffee grounds.  Faint.  Have stool that is bloody or black.  Cannot swallow, drink, or eat. Summary  Gastroesophageal reflux happens when acid from the stomach flows up into the esophagus. GERD is a disease in which the reflux happens often, causes frequent or severe symptoms, or causes problems such as damage to the esophagus.  Treatment for this condition may vary depending on how severe your symptoms are. Your health care provider may recommend diet and lifestyle changes, medicine, or surgery.  Contact a health care provider if you have new or worsening symptoms.  Take over-the-counter and prescription medicines only as  told by your health care provider. Do not take aspirin, ibuprofen, or other NSAIDs unless your health care provider told you to do so.  Keep all follow-up visits as told by your health care provider. This is important. This information is not intended to replace advice given to you by your health care provider. Make sure you discuss any questions you have with your health care provider. Document Revised: 09/30/2017 Document Reviewed: 09/30/2017 Elsevier Patient Education  [place].  I appreciate the  opportunity to care for you  Thank You   Harl Bowie , MD

## 2019-11-07 ENCOUNTER — Encounter: Payer: Self-pay | Admitting: Gastroenterology

## 2019-11-07 DIAGNOSIS — R9721 Rising PSA following treatment for malignant neoplasm of prostate: Secondary | ICD-10-CM | POA: Diagnosis not present

## 2019-11-07 DIAGNOSIS — N3941 Urge incontinence: Secondary | ICD-10-CM | POA: Diagnosis not present

## 2019-11-07 DIAGNOSIS — C61 Malignant neoplasm of prostate: Secondary | ICD-10-CM | POA: Diagnosis not present

## 2019-11-16 DIAGNOSIS — H40013 Open angle with borderline findings, low risk, bilateral: Secondary | ICD-10-CM | POA: Diagnosis not present

## 2019-11-16 DIAGNOSIS — Z961 Presence of intraocular lens: Secondary | ICD-10-CM | POA: Diagnosis not present

## 2019-11-16 DIAGNOSIS — H0102A Squamous blepharitis right eye, upper and lower eyelids: Secondary | ICD-10-CM | POA: Diagnosis not present

## 2019-11-16 DIAGNOSIS — H04123 Dry eye syndrome of bilateral lacrimal glands: Secondary | ICD-10-CM | POA: Diagnosis not present

## 2019-11-16 DIAGNOSIS — H0102B Squamous blepharitis left eye, upper and lower eyelids: Secondary | ICD-10-CM | POA: Diagnosis not present

## 2019-11-16 DIAGNOSIS — H35373 Puckering of macula, bilateral: Secondary | ICD-10-CM | POA: Diagnosis not present

## 2019-11-21 ENCOUNTER — Ambulatory Visit (AMBULATORY_SURGERY_CENTER): Payer: PPO | Admitting: Gastroenterology

## 2019-11-21 ENCOUNTER — Telehealth: Payer: Self-pay | Admitting: Gastroenterology

## 2019-11-21 ENCOUNTER — Other Ambulatory Visit: Payer: Self-pay | Admitting: Gastroenterology

## 2019-11-21 ENCOUNTER — Encounter: Payer: Self-pay | Admitting: Gastroenterology

## 2019-11-21 ENCOUNTER — Other Ambulatory Visit: Payer: Self-pay

## 2019-11-21 VITALS — BP 108/61 | HR 56 | Temp 98.0°F | Resp 12 | Ht 67.0 in | Wt 206.0 lb

## 2019-11-21 DIAGNOSIS — Z8719 Personal history of other diseases of the digestive system: Secondary | ICD-10-CM

## 2019-11-21 DIAGNOSIS — Z8711 Personal history of peptic ulcer disease: Secondary | ICD-10-CM

## 2019-11-21 DIAGNOSIS — K259 Gastric ulcer, unspecified as acute or chronic, without hemorrhage or perforation: Secondary | ICD-10-CM | POA: Diagnosis not present

## 2019-11-21 DIAGNOSIS — K2970 Gastritis, unspecified, without bleeding: Secondary | ICD-10-CM

## 2019-11-21 DIAGNOSIS — K219 Gastro-esophageal reflux disease without esophagitis: Secondary | ICD-10-CM

## 2019-11-21 DIAGNOSIS — I10 Essential (primary) hypertension: Secondary | ICD-10-CM | POA: Diagnosis not present

## 2019-11-21 MED ORDER — SODIUM CHLORIDE 0.9 % IV SOLN: 500.0000 mL | Freq: Once | INTRAVENOUS | Status: DC

## 2019-11-21 MED ORDER — SUCRALFATE 1 G PO TABS
1.0000 g | ORAL_TABLET | Freq: Two times a day (BID) | ORAL | 3 refills | Status: DC
Start: 2019-11-21 — End: 2020-11-28

## 2019-11-21 MED ORDER — SUCRALFATE 1 GM/10ML PO SUSP
1.0000 g | Freq: Two times a day (BID) | ORAL | 5 refills | Status: DC
Start: 2019-11-21 — End: 2019-11-21

## 2019-11-21 NOTE — Op Note (Signed)
[facility] Patient Name: Nicholas Daniels Procedure Date: 11/21/2019 10:12 AM MRN: [id number] Endoscopist: Mauri Pole , MD Age: 69 Referring MD:  Date of Birth: 1950/04/11 Gender: Male Account #: [id number] Procedure:                Upper GI endoscopy Indications:              Follow-up of gastric ulcer Medicines:                Monitored Anesthesia Care Procedure:                Pre-Anesthesia Assessment:                           - Prior to the procedure, a History and Physical                            was performed, and patient medications and                            allergies were reviewed. The patient's tolerance of                            previous anesthesia was also reviewed. The risks                            and benefits of the procedure and the sedation                            options and risks were discussed with the patient.                            All questions were answered, and informed consent                            was obtained. Prior Anticoagulants: The patient has                            taken no previous anticoagulant or antiplatelet                            agents except for aspirin. ASA Grade Assessment: II                            - A patient with mild systemic disease. After                            reviewing the risks and benefits, the patient was                            deemed in satisfactory condition to undergo the                            procedure.  After obtaining informed consent, the endoscope was                            passed under direct vision. Throughout the                            procedure, the patient's blood pressure, pulse, and                            oxygen saturations were monitored continuously. The                            Endoscope was introduced through the mouth, and                            advanced to the second part of duodenum. The upper                             GI endoscopy was accomplished without difficulty.                            The patient tolerated the procedure well. Scope In: Scope Out: Findings:                 The Z-line was regular and was found 37 cm from the                            incisors.                           The gastroesophageal flap valve was visualized                            endoscopically and classified as Hill Grade IV (no                            fold, wide open lumen, hiatal hernia present).                           Few non-bleeding cratered, linear and superficial                            gastric ulcers with no stigmata of bleeding were                            found in the prepyloric region of the stomach. The                            largest lesion was 6 mm in largest dimension.                            Biopsies were taken with a cold forceps for  histology.                           The cardia and gastric fundus were normal on                            retroflexion.                           The examined duodenum was normal. Complications:            No immediate complications. Estimated Blood Loss:     Estimated blood loss was minimal. Impression:               - Z-line regular, 37 cm from the incisors.                           - Gastroesophageal flap valve classified as Hill                            Grade IV (no fold, wide open lumen, hiatal hernia                            present).                           - Non-bleeding gastric ulcers with no stigmata of                            bleeding. Biopsied.                           - Normal examined duodenum. Recommendation:           - Resume previous diet.                           - Continue present medications.                           - Await pathology results.                           - Use Prilosec (omeprazole) 40 mg PO daily for 1                            year.                            - Use sucralfate tablets 1 gram PO BID for 6 months.                           - Return to GI office in 6 months, please call                            [phone] to schedule office visit.                           -  Will need to discuss with Cardiology/PMD if can                            reduce aspirin dose to 81mg  instead of 325mg  given                            non healing gastric ulcers Mauri Pole, MD 11/21/2019 10:41:51 AM This report has been signed electronically.

## 2019-11-21 NOTE — Progress Notes (Signed)
Report to PACU, RN, vss, BBS= Clear.  

## 2019-11-21 NOTE — Telephone Encounter (Signed)
Carafate tablets sent into [facility]

## 2019-11-21 NOTE — Patient Instructions (Signed)
The biopsies taken today have been sent for pathology.  The results can take 1-3 weeks to receive.      You may resume your previous diet and medication schedule.  Thank you for allowing us to care for you today!!!   YOU HAD AN ENDOSCOPIC PROCEDURE TODAY AT THE [facility]:   Refer to the procedure report that was given to you for any specific questions about what was found during the examination.  If the procedure report does not answer your questions, please call your gastroenterologist to clarify.  If you requested that your care partner not be given the details of your procedure findings, then the procedure report has been included in a sealed envelope for you to review at your convenience later.  YOU SHOULD EXPECT: Some feelings of bloating in the abdomen. Passage of more gas than usual.  Walking can help get rid of the air that was put into your GI tract during the procedure and reduce the bloating.   Please Note:  You might notice some irritation and congestion in your nose or some drainage.  This is from the oxygen used during your procedure.  There is no need for concern and it should clear up in a day or so.  SYMPTOMS TO REPORT IMMEDIATELY:   Following upper endoscopy (EGD)  Vomiting of blood or coffee ground material  New chest pain or pain under the shoulder blades  Painful or persistently difficult swallowing  New shortness of breath  Fever of 100F or higher  Black, tarry-looking stools  For urgent or emergent issues, a gastroenterologist can be reached at any hour by calling [phone]. Do not use MyChart messaging for urgent concerns.    DIET:  We do recommend a small meal at first, but then you may proceed to your regular diet.  Drink plenty of fluids but you should avoid alcoholic beverages for 24 hours.  ACTIVITY:  You should plan to take it easy for the rest of today and you should NOT DRIVE or use heavy machinery until tomorrow (because of the  sedation medicines used during the test).    FOLLOW UP: Our staff will call the number listed on your records 48-72 hours following your procedure to check on you and address any questions or concerns that you may have regarding the information given to you following your procedure. If we do not reach you, we will leave a message.  We will attempt to reach you two times.  During this call, we will ask if you have developed any symptoms of COVID 19. If you develop any symptoms (ie: fever, flu-like symptoms, shortness of breath, cough etc.) before then, please call [phone].  If you test positive for Covid 19 in the 2 weeks post procedure, please call and report this information to us.    If any biopsies were taken you will be contacted by phone or by letter within the next 1-3 weeks.  Please call us at [phone] if you have not heard about the biopsies in 3 weeks.    SIGNATURES/CONFIDENTIALITY: You and/or your care partner have signed paperwork which will be entered into your electronic medical record.  These signatures attest to the fact that that the information above on your After Visit Summary has been reviewed and is understood.  Full responsibility of the confidentiality of this discharge information lies with you and/or your care-partner. 

## 2019-11-21 NOTE — Progress Notes (Signed)
Called to room to assist during endoscopic procedure.  Patient ID and intended procedure confirmed with present staff. Received instructions for my participation in the procedure from the performing physician.  

## 2019-11-21 NOTE — Progress Notes (Signed)
VS-CW 

## 2019-11-23 ENCOUNTER — Telehealth: Payer: Self-pay

## 2019-11-23 NOTE — Telephone Encounter (Signed)
°  Follow up Call-  Call back number 11/21/2019 08/02/2019  Post procedure Call Back phone  # [phone]  Permission to leave phone message Yes Yes  Some recent data might be hidden     Patient questions:  Do you have a fever, pain , or abdominal swelling? No. Pain Score  0 *  Have you tolerated food without any problems? Yes.    Have you been able to return to your normal activities? Yes.    Do you have any questions about your discharge instructions: Diet   No. Medications  No. Follow up visit  No.  Do you have questions or concerns about your Care? No.  Actions: * If pain score is 4 or above: 1. No action needed, pain <4.Have you developed a fever since your procedure? no  2.   Have you had an respiratory symptoms (SOB or cough) since your procedure? no  3.   Have you tested positive for COVID 19 since your procedure no  4.   Have you had any family members/close contacts diagnosed with the COVID 19 since your procedure?  no   If yes to any of these questions please route to Joylene [name], RN and Joella Prince, RN

## 2019-11-29 ENCOUNTER — Encounter: Payer: Self-pay | Admitting: Gastroenterology

## 2020-01-26 DIAGNOSIS — D2262 Melanocytic nevi of left upper limb, including shoulder: Secondary | ICD-10-CM | POA: Diagnosis not present

## 2020-01-26 DIAGNOSIS — D1801 Hemangioma of skin and subcutaneous tissue: Secondary | ICD-10-CM | POA: Diagnosis not present

## 2020-01-26 DIAGNOSIS — L918 Other hypertrophic disorders of the skin: Secondary | ICD-10-CM | POA: Diagnosis not present

## 2020-01-26 DIAGNOSIS — L821 Other seborrheic keratosis: Secondary | ICD-10-CM | POA: Diagnosis not present

## 2020-01-26 DIAGNOSIS — L814 Other melanin hyperpigmentation: Secondary | ICD-10-CM | POA: Diagnosis not present

## 2020-01-26 DIAGNOSIS — L218 Other seborrheic dermatitis: Secondary | ICD-10-CM | POA: Diagnosis not present

## 2020-01-26 DIAGNOSIS — D2271 Melanocytic nevi of right lower limb, including hip: Secondary | ICD-10-CM | POA: Diagnosis not present

## 2020-01-26 DIAGNOSIS — D2272 Melanocytic nevi of left lower limb, including hip: Secondary | ICD-10-CM | POA: Diagnosis not present

## 2020-01-26 DIAGNOSIS — D2261 Melanocytic nevi of right upper limb, including shoulder: Secondary | ICD-10-CM | POA: Diagnosis not present

## 2020-01-26 DIAGNOSIS — Z85828 Personal history of other malignant neoplasm of skin: Secondary | ICD-10-CM | POA: Diagnosis not present

## 2020-01-26 DIAGNOSIS — D225 Melanocytic nevi of trunk: Secondary | ICD-10-CM | POA: Diagnosis not present

## 2020-02-08 DIAGNOSIS — C61 Malignant neoplasm of prostate: Secondary | ICD-10-CM | POA: Diagnosis not present

## 2020-02-14 DIAGNOSIS — H35373 Puckering of macula, bilateral: Secondary | ICD-10-CM | POA: Diagnosis not present

## 2020-02-14 DIAGNOSIS — H40013 Open angle with borderline findings, low risk, bilateral: Secondary | ICD-10-CM | POA: Diagnosis not present

## 2020-02-14 DIAGNOSIS — H0102B Squamous blepharitis left eye, upper and lower eyelids: Secondary | ICD-10-CM | POA: Diagnosis not present

## 2020-02-14 DIAGNOSIS — H04123 Dry eye syndrome of bilateral lacrimal glands: Secondary | ICD-10-CM | POA: Diagnosis not present

## 2020-02-14 DIAGNOSIS — Z961 Presence of intraocular lens: Secondary | ICD-10-CM | POA: Diagnosis not present

## 2020-02-14 DIAGNOSIS — H0102A Squamous blepharitis right eye, upper and lower eyelids: Secondary | ICD-10-CM | POA: Diagnosis not present

## 2020-02-15 DIAGNOSIS — C61 Malignant neoplasm of prostate: Secondary | ICD-10-CM | POA: Diagnosis not present

## 2020-02-15 DIAGNOSIS — N3020 Other chronic cystitis without hematuria: Secondary | ICD-10-CM | POA: Diagnosis not present

## 2020-02-15 DIAGNOSIS — R9721 Rising PSA following treatment for malignant neoplasm of prostate: Secondary | ICD-10-CM | POA: Diagnosis not present

## 2020-02-15 DIAGNOSIS — N3941 Urge incontinence: Secondary | ICD-10-CM | POA: Diagnosis not present

## 2020-02-16 ENCOUNTER — Other Ambulatory Visit (HOSPITAL_COMMUNITY): Payer: Self-pay | Admitting: Urology

## 2020-02-16 DIAGNOSIS — C61 Malignant neoplasm of prostate: Secondary | ICD-10-CM

## 2020-02-16 DIAGNOSIS — R9721 Rising PSA following treatment for malignant neoplasm of prostate: Secondary | ICD-10-CM

## 2020-02-29 ENCOUNTER — Other Ambulatory Visit: Payer: Self-pay

## 2020-02-29 ENCOUNTER — Ambulatory Visit (HOSPITAL_COMMUNITY)
Admission: RE | Admit: 2020-02-29 | Discharge: 2020-02-29 | Disposition: A | Discharge status: Home / Self Care | Payer: PPO | Source: Ambulatory Visit | Attending: Urology | Admitting: Urology

## 2020-02-29 DIAGNOSIS — C61 Malignant neoplasm of prostate: Secondary | ICD-10-CM | POA: Insufficient documentation

## 2020-02-29 DIAGNOSIS — R9721 Rising PSA following treatment for malignant neoplasm of prostate: Secondary | ICD-10-CM | POA: Diagnosis not present

## 2020-02-29 DIAGNOSIS — R972 Elevated prostate specific antigen [PSA]: Secondary | ICD-10-CM | POA: Diagnosis not present

## 2020-02-29 MED ORDER — PIFLIFOLASTAT F 18 (PYLARIFY) INJECTION
9.0000 | Freq: Once | INTRAVENOUS | Status: AC
  Administered 2020-02-29: 9.5 via INTRAVENOUS

## 2020-04-05 DIAGNOSIS — Z85828 Personal history of other malignant neoplasm of skin: Secondary | ICD-10-CM | POA: Diagnosis not present

## 2020-04-05 DIAGNOSIS — D692 Other nonthrombocytopenic purpura: Secondary | ICD-10-CM | POA: Diagnosis not present

## 2020-04-19 ENCOUNTER — Other Ambulatory Visit: Payer: Self-pay

## 2020-04-19 ENCOUNTER — Ambulatory Visit (INDEPENDENT_AMBULATORY_CARE_PROVIDER_SITE_OTHER): Payer: PPO | Admitting: Ophthalmology

## 2020-04-19 ENCOUNTER — Encounter (INDEPENDENT_AMBULATORY_CARE_PROVIDER_SITE_OTHER): Payer: Self-pay | Admitting: Ophthalmology

## 2020-04-19 DIAGNOSIS — H35371 Puckering of macula, right eye: Secondary | ICD-10-CM

## 2020-04-19 DIAGNOSIS — H35372 Puckering of macula, left eye: Secondary | ICD-10-CM

## 2020-04-19 DIAGNOSIS — H353131 Nonexudative age-related macular degeneration, bilateral, early dry stage: Secondary | ICD-10-CM | POA: Diagnosis not present

## 2020-04-19 NOTE — Assessment & Plan Note (Signed)
With macular thickening, the degree of which is overall stable over the last 6 months.  Outer retinal change at the photoreceptor layer is stable yet worrisome and we will continue to monitor for progressive changes

## 2020-04-19 NOTE — Assessment & Plan Note (Signed)
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

## 2020-04-19 NOTE — Progress Notes (Signed)
04/19/2020     CHIEF COMPLAINT Patient presents for Retina Follow Up (6 Month f\u OU. OCT/Pt states no changes in vision. Denies any new complaints. Using gtts as directed.)   HISTORY OF PRESENT ILLNESS: Nicholas Daniels is a 70 y.o. 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  Severity is moderate.  Duration of 6 months.  Since onset it is stable.  I, the attending physician,  performed the HPI with the patient and updated documentation appropriately. Additional comments: 6 Month f\u OU. OCT Pt states no changes in vision. Denies any new complaints. Using gtts as directed.       Last edited by Tilda Franco on 04/19/2020  1:38 PM. (History)      Referring physician: Leanna Battles, MD [place],  [place] [address]  HISTORICAL INFORMATION:   Selected notes from the MEDICAL RECORD NUMBER       CURRENT MEDICATIONS: Current Outpatient Medications (Ophthalmic Drugs)  Medication Sig  . latanoprost (XALATAN) 0.005 % ophthalmic solution Place 1 drop into both eyes at bedtime.  . timolol (TIMOPTIC) 0.5 % ophthalmic solution Place 1 drop into both eyes daily.   No current facility-administered medications for this visit. (Ophthalmic Drugs)   Current Outpatient Medications (Other)  Medication Sig  . AMBULATORY NON FORMULARY MEDICATION Dexamethasone proportionte  . aspirin 325 MG tablet Take 325 mg by mouth daily.  . calcium-vitamin D (CALCIUM 500/D) 500-200 MG-UNIT tablet Take 1 tablet by mouth.  . diltiazem (CARTIA XT) 120 MG 24 hr capsule Take 120 mg by mouth daily.  . Flaxseed, Linseed, (FLAXSEED OIL PO) Take 2,800 mg by mouth daily.  . Multiple Vitamin (MULTIVITAMIN) tablet Take 1 tablet by mouth daily.  . Niacin CR 250 MG TBCR Take 250 mg by mouth at bedtime.   Marland Kitchen omeprazole (PRILOSEC) 40 MG capsule TAKE 1 CAPSULE BY MOUTH EVERY DAY  . sucralfate (CARAFATE) 1 g tablet Take 1 tablet (1 g total) by  mouth 2 (two) times daily.  . Vitamin D, Cholecalciferol, 50 MCG (2000 UT) CAPS Take by mouth.   No current facility-administered medications for this visit. (Other)      REVIEW OF SYSTEMS:    ALLERGIES No Known Allergies  PAST MEDICAL HISTORY Past Medical History:  Diagnosis Date  . Cataract    removed bilateraly  . Cough   . Glaucoma   . Hypertension   . PFO (patent foramen ovale)    PFO noted on coronary CT 09/05/14  . Prostate cancer ([place]) 2009   Past Surgical History:  Procedure Laterality Date  . CATARACT EXTRACTION     bilateral and a wrinkle on retina on the left   . CHOLECYSTECTOMY    . COLONOSCOPY    . PROSTATE SURGERY  2009  . radiation treatment    . RESECTION OF MEDIASTINAL MASS N/A 02/26/2015   Procedure: RESECTION OF MEDIASTINAL MASS;  Surgeon: Ivin Poot, MD;  Location: [facility];  Service: Thoracic;  Laterality: N/A;  . STERNOTOMY N/A 02/26/2015   Procedure: STERNOTOMY;  Surgeon: Ivin Poot, MD;  Location: [facility] OR;  Service: Thoracic;  Laterality: N/A;  . UPPER GASTROINTESTINAL ENDOSCOPY      FAMILY HISTORY Family History  Problem Relation Age of Onset  . Heart disease Brother   . COPD Brother   . Cancer Brother        liver  .  Liver cancer Brother   . Heart disease Mother        MI  . COPD Mother   . Pneumonia Mother   . Diabetes Mother   . COPD Father   . Colon polyps Other   . Esophageal cancer Neg Hx   . Rectal cancer Neg Hx   . Stomach cancer Neg Hx     SOCIAL HISTORY Social History   Tobacco Use  . Smoking status: Never Smoker  . Smokeless tobacco: Never Used  Vaping Use  . Vaping Use: Never used  Substance Use Topics  . Alcohol use: No  . Drug use: No         OPHTHALMIC EXAM: Base Eye Exam    Visual Acuity (Snellen - Linear)      Right Left   Dist cc 20/20 20/25 +2   Correction: Glasses       Tonometry (Tonopen, 1:37 PM)      Right Left   Pressure 12 11       Pupils      Pupils Dark Light Shape  React APD   Right PERRL 3 2 Round Sluggish None   Left PERRL 3 2 Round Sluggish None       Visual Fields (Counting fingers)      Left Right    Full Full       Neuro/Psych    Oriented x3: Yes   Mood/Affect: Normal       Dilation    Both eyes: 1.0% Mydriacyl, 2.5% Phenylephrine @ 1:37 PM        Slit Lamp and Fundus Exam    External Exam      Right Left   External Normal Normal       Slit Lamp Exam      Right Left   Lids/Lashes Normal Normal   Conjunctiva/Sclera White and quiet White and quiet   Cornea Clear Clear   Anterior Chamber Deep and quiet Deep and quiet   Iris Round and reactive Round and reactive   Lens Posterior chamber intraocular lens Posterior chamber intraocular lens   Anterior Vitreous Normal Normal       Fundus Exam      Right Left   Posterior Vitreous Normal Normal   Disc Normal Normal   C/D Ratio 0.25 0.3   Macula Normal Epiretinal membrane,    Vessels Normal Normal   Periphery Normal Normal          IMAGING AND PROCEDURES  Imaging and Procedures for 04/19/20  OCT, Retina - OU - Both Eyes       Right Eye Scan locations included subfoveal. Central Foveal Thickness: 287. Progression has been stable. Findings include epiretinal membrane.   Left Eye Quality was good. Scan locations included subfoveal. Central Foveal Thickness: 438. Progression has been stable. Findings include epiretinal membrane.   Notes Minor epiretinal membrane with no topographic distortion OD, temporal to fovea.  OS with overall stable appearance and retinal thickening of the left eye from epiretinal membrane, what is new however is a small outer retinal nodule of subretinal debris, this could be a change in the photoreceptor layer.  Small photoreceptor layer changes overall stable over the last 6 months we will continue to observe                ASSESSMENT/PLAN:  Right epiretinal membrane Minor, seen only on cross-section nasal to the foveal region, not  thickening the retina at this time, observe  Left epiretinal membrane  With macular thickening, the degree of which is overall stable over the last 6 months.  Outer retinal change at the photoreceptor layer is stable yet worrisome and we will continue to monitor for progressive changes      ICD-10-CM   1. Left epiretinal membrane  H35.372 OCT, Retina - OU - Both Eyes  2. Right epiretinal membrane  H35.371     1.  2.  3.  Ophthalmic Meds Ordered this visit:  No orders of the defined types were placed in this encounter.      Return in about 6 months (around 10/17/2020) for DILATE OU, OCT.  There are no Patient Instructions on file for this visit.   Explained the diagnoses, plan, and follow up with the patient and they expressed understanding.  Patient expressed understanding of the importance of proper follow up care.   Clent Demark Narciso Stoutenburg M.D. Diseases & Surgery of the Retina and Vitreous Retina [facility] Diabetic [place] 04/19/20     Abbreviations: M myopia (nearsighted); A astigmatism; H hyperopia (farsighted); P presbyopia; Mrx spectacle prescription;  CTL contact lenses; OD right eye; OS left eye; OU both eyes  XT exotropia; ET esotropia; PEK punctate epithelial keratitis; PEE punctate epithelial erosions; DES dry eye syndrome; MGD meibomian gland dysfunction; ATs artificial tears; PFAT's preservative free artificial tears; [place] nuclear sclerotic cataract; PSC posterior subcapsular cataract; ERM epi-retinal membrane; PVD posterior vitreous detachment; RD retinal detachment; DM diabetes mellitus; DR diabetic retinopathy; NPDR non-proliferative diabetic retinopathy; PDR proliferative diabetic retinopathy; CSME clinically significant macular edema; DME diabetic macular edema; dbh dot blot hemorrhages; CWS cotton wool spot; POAG primary open angle glaucoma; C/D cup-to-disc ratio; HVF humphrey visual field; GVF goldmann visual field; OCT optical coherence tomography; IOP intraocular pressure;  BRVO Branch retinal vein occlusion; CRVO central retinal vein occlusion; CRAO central retinal artery occlusion; BRAO branch retinal artery occlusion; RT retinal tear; SB scleral buckle; PPV pars plana vitrectomy; VH Vitreous hemorrhage; PRP panretinal laser photocoagulation; IVK intravitreal kenalog; VMT vitreomacular traction; MH Macular hole;  NVD neovascularization of the disc; NVE neovascularization elsewhere; AREDS age related eye disease study; ARMD age related macular degeneration; POAG primary open angle glaucoma; EBMD epithelial/anterior basement membrane dystrophy; ACIOL anterior chamber intraocular lens; IOL intraocular lens; PCIOL posterior chamber intraocular lens; Phaco/IOL phacoemulsification with intraocular lens placement; [place] photorefractive keratectomy; LASIK laser assisted in situ keratomileusis; HTN hypertension; DM diabetes mellitus; COPD chronic obstructive pulmonary disease

## 2020-04-19 NOTE — Assessment & Plan Note (Signed)
Minor, seen only on cross-section nasal to the foveal region, not thickening the retina at this time, observe

## 2020-05-11 DIAGNOSIS — C61 Malignant neoplasm of prostate: Secondary | ICD-10-CM | POA: Diagnosis not present

## 2020-05-14 DIAGNOSIS — E785 Hyperlipidemia, unspecified: Secondary | ICD-10-CM | POA: Diagnosis not present

## 2020-05-18 DIAGNOSIS — N3020 Other chronic cystitis without hematuria: Secondary | ICD-10-CM | POA: Diagnosis not present

## 2020-05-18 DIAGNOSIS — C61 Malignant neoplasm of prostate: Secondary | ICD-10-CM | POA: Diagnosis not present

## 2020-05-18 DIAGNOSIS — R9721 Rising PSA following treatment for malignant neoplasm of prostate: Secondary | ICD-10-CM | POA: Diagnosis not present

## 2020-05-18 DIAGNOSIS — N3941 Urge incontinence: Secondary | ICD-10-CM | POA: Diagnosis not present

## 2020-05-21 DIAGNOSIS — J029 Acute pharyngitis, unspecified: Secondary | ICD-10-CM | POA: Diagnosis not present

## 2020-05-21 DIAGNOSIS — Z1152 Encounter for screening for COVID-19: Secondary | ICD-10-CM | POA: Diagnosis not present

## 2020-05-24 ENCOUNTER — Other Ambulatory Visit: Payer: Self-pay | Admitting: Family

## 2020-05-24 ENCOUNTER — Telehealth: Payer: Self-pay | Admitting: Family

## 2020-05-24 ENCOUNTER — Telehealth: Payer: Self-pay

## 2020-05-24 ENCOUNTER — Ambulatory Visit (HOSPITAL_COMMUNITY): Payer: PPO

## 2020-05-24 DIAGNOSIS — C61 Malignant neoplasm of prostate: Secondary | ICD-10-CM

## 2020-05-24 DIAGNOSIS — I10 Essential (primary) hypertension: Secondary | ICD-10-CM

## 2020-05-24 DIAGNOSIS — U071 COVID-19: Secondary | ICD-10-CM

## 2020-05-24 DIAGNOSIS — E669 Obesity, unspecified: Secondary | ICD-10-CM

## 2020-05-24 NOTE — Progress Notes (Signed)
I connected by phone with Nicholas Daniels on 05/24/2020 at 11:54 AM to discuss the potential use of a new treatment for mild to moderate COVID-19 viral infection in non-hospitalized patients.  This patient is a 70 y.o. male that meets the FDA criteria for Emergency Use Authorization of COVID monoclonal antibody sotrovimab.  Has a (+) direct SARS-CoV-2 viral test result  Has mild or moderate COVID-19   Is NOT hospitalized due to COVID-19  Is within 10 days of symptom onset  Has at least one of the high risk factor(s) for progression to severe COVID-19 and/or hospitalization as defined in EUA.  Specific high risk criteria : Older age (>/= 70 yo), Immunosuppressive Disease or Treatment and Cardiovascular disease or hypertension   I have spoken and communicated the following to the patient or parent/caregiver regarding COVID monoclonal antibody treatment:  1. FDA has authorized the emergency use for the treatment of mild to moderate COVID-19 in adults and pediatric patients with positive results of direct SARS-CoV-2 viral testing who are 42 years of age and older weighing at least 40 kg, and who are at high risk for progressing to severe COVID-19 and/or hospitalization.  2. The significant known and potential risks and benefits of COVID monoclonal antibody, and the extent to which such potential risks and benefits are unknown.  3. Information on available alternative treatments and the risks and benefits of those alternatives, including clinical trials.  4. Patients treated with COVID monoclonal antibody should continue to self-isolate and use infection control measures (e.g., wear mask, isolate, social distance, avoid sharing personal items, clean and disinfect "high touch" surfaces, and frequent handwashing) according to CDC guidelines.   5. The patient or parent/caregiver has the option to accept or refuse COVID monoclonal antibody treatment.  After reviewing this information with the  patient, the patient has agreed to receive one of the available covid 19 monoclonal antibodies and will be provided an appropriate fact sheet prior to infusion.   Nicholas Po, FNP 05/24/2020 11:54 AM

## 2020-05-24 NOTE — Telephone Encounter (Signed)
Called to discuss with patient about COVID-19 symptoms and the use of one of the available treatments for those with mild to moderate Covid symptoms and at a high risk of hospitalization.  Pt appears to qualify for outpatient treatment due to co-morbid conditions and/or a member of an at-risk group in accordance with the FDA Emergency Use Authorization.    Symptom onset: 05/20/20 Cough, chills,fatigue Vaccinated: Yes Booster? No Immunocompromised? Prostate cancer Qualifiers: HTN States wife is a double lung transplant pt. Pt. Would like to speak with APP.  Nicholas Daniels

## 2020-05-24 NOTE — Telephone Encounter (Signed)
Follow up call to RN pre-screen.   Nicholas Daniels had symptoms starting on 2/12 with chills. He is currently vaccinated and booster. Qualifying factors include prostate cancer, hypertension, age and overweight. Tested positive at home and has a copy of his positive test. We discussed the risks, benefits and potential financial costs associated with treatment with Sotrovimab. He wishes to proceed with treatment.  [place],   We contacted you because you were recently diagnosed with COVID-19 and may benefit from a new treatment for mild to moderate disease. This treatment helps reduce the chance of being hospitalized. For some patients with medical conditions that may increase the chances of an infection, the treatment also decreases the risk for serious symptoms related to COVID-19.   The Food and Drug Administration (FDA) approved emergency use of a new drug to treat patients with mild to moderate symptoms who have risk factors that could cause severe symptoms related to COVID-19. This new treatment is a monoclonal antibody. It works by attaching like a magnet to the SARS-CoV2 virus (the virus that causes COVID-19) and stops it from infecting more cells in your body. It does not kill the virus, but it prevents it from spreading throughout your body with the hope that it will decrease your symptoms after it is administered.   This new drug is an intravenous (IV) infusion called Sotrovimab that is given over one 30-minute session in our [facility] outpatient infusion clinic. You will need to stay about 60 minutes after the infusion to ensure you are tolerating it well and to watch for any allergic reaction to the medication. More information will be given to you at the time of your appointment.   Important information:  . The potential side effects: 2-4% of recipients experience nausea, vomiting, diarrhea, dizziness, headaches, itching, worsening fevers or chills for around 24 hours. . There  have been no serious infusion-related reactions. . Of the more than 3,000 patients who received the infusion, only one had an allergic response that ended once the infusion was stopped. This is why we monitor all of our patients closely for 60 minutes after the infusion.  . The COVID-19 vaccine (including boosters) must be delayed at least 90 days after receiving this infusion.  . The medication itself is free, but your insurance will be charged an infusion fee. The amount you may owe later varies from insurance to insurance. If you do not have insurance, we can put you in touch with our billing department. Please contact your insurance agent to discuss prior to your appointment if you would like further details about billing specific to your policy. The CMS code is: M45  . If you have been tested outside of a [facility], you MUST bring a copy of your positive test with you the morning of your appointment. You may take a photo of this and upload to your MyChart portal,  have the testing facility fax the result to [phone] or email a copy to [email or web address].    You have been scheduled to receive the monoclonal antibody therapy at [place]:  05/25/20 at 11:30am   The address for the infusion clinic site is:   --The GPS address is [address], and the parking is located near the [employer] building where you will see a "COVID19 Infusion" feather banner marking the entrance to parking. (See photos below.)            --Enter into the 2nd entrance where the "  wave, flag banner" is at the road. Turn into this 2nd entrance and immediately turn left or right to park in one of the marked spaces.   --Please stay in your car and call the desk for assistance inside at [phone]. Let us know which space you are in.    --The average time in the department is roughly 90 minutes to two hours for monoclonal treatment. This includes preparation of the medication, IV  start and the required 60-minute monitoring after the infusion.     Should you develop worsening shortness of breath, chest pain or severe breathing problems, please do not wait for this appointment and go to the emergency room for evaluation and treatment instead. You will undergo another oxygen screen before your infusion to ensure this is the best treatment option for you. There is a chance that the best decision may be to send you to the emergency room for evaluation at the time of your appointment.    The day of your visit, you should: Marland Kitchen Get plenty of rest the night before and drink plenty of water. . Eat a light meal/snack before coming and take your medications as prescribed.  . Wear warm, comfortable clothes with a shirt that can roll-up over the elbow (will need IV start).  . Wear a mask.  . Consider bringing an activity to help pass the time.  Terri Piedra, NP 05/24/2020 11:53 AM

## 2020-05-25 ENCOUNTER — Ambulatory Visit (HOSPITAL_COMMUNITY)
Admission: RE | Admit: 2020-05-25 | Discharge: 2020-05-25 | Disposition: A | Discharge status: Home / Self Care | Payer: PPO | Source: Ambulatory Visit | Attending: Pulmonary Disease | Admitting: Pulmonary Disease

## 2020-05-25 DIAGNOSIS — U071 COVID-19: Secondary | ICD-10-CM | POA: Diagnosis not present

## 2020-05-25 DIAGNOSIS — E669 Obesity, unspecified: Secondary | ICD-10-CM | POA: Insufficient documentation

## 2020-05-25 DIAGNOSIS — Z6830 Body mass index (BMI) 30.0-30.9, adult: Secondary | ICD-10-CM | POA: Insufficient documentation

## 2020-05-25 DIAGNOSIS — C61 Malignant neoplasm of prostate: Secondary | ICD-10-CM

## 2020-05-25 DIAGNOSIS — I10 Essential (primary) hypertension: Secondary | ICD-10-CM | POA: Diagnosis not present

## 2020-05-25 MED ORDER — FAMOTIDINE IN NACL 20-0.9 MG/50ML-% IV SOLN: 20.0000 mg | Freq: Once | INTRAVENOUS | Status: DC | PRN

## 2020-05-25 MED ORDER — SODIUM CHLORIDE 0.9 % IV SOLN: INTRAVENOUS | Status: DC | PRN

## 2020-05-25 MED ORDER — METHYLPREDNISOLONE SODIUM SUCC 125 MG IJ SOLR: 125.0000 mg | Freq: Once | INTRAMUSCULAR | Status: DC | PRN

## 2020-05-25 MED ORDER — DIPHENHYDRAMINE HCL 50 MG/ML IJ SOLN: 50.0000 mg | Freq: Once | INTRAMUSCULAR | Status: DC | PRN

## 2020-05-25 MED ORDER — ALBUTEROL SULFATE HFA 108 (90 BASE) MCG/ACT IN AERS: 2.0000 | INHALATION_SPRAY | Freq: Once | RESPIRATORY_TRACT | Status: DC | PRN

## 2020-05-25 MED ORDER — EPINEPHRINE 0.3 MG/0.3ML IJ SOAJ: 0.3000 mg | Freq: Once | INTRAMUSCULAR | Status: DC | PRN

## 2020-05-25 MED ORDER — SOTROVIMAB 500 MG/8ML IV SOLN
500.0000 mg | Freq: Once | INTRAVENOUS | Status: AC
  Administered 2020-05-25: 500 mg via INTRAVENOUS

## 2020-05-25 NOTE — Discharge Instructions (Signed)

## 2020-05-25 NOTE — Progress Notes (Signed)
Patient reviewed Fact Sheet for Patients, Parents, and Caregivers for Emergency Use Authorization (EUA) of sotrovimab for the Treatment of Coronavirus. Patient also reviewed and is agreeable to the estimated cost of treatment. Patient is agreeable to proceed.   

## 2020-05-25 NOTE — Progress Notes (Signed)
Diagnosis: COVID-19  Physician: Dr. Patrick Wright  Procedure: Covid Infusion Clinic Med: Sotrovimab infusion - Provided patient with sotrovimab fact sheet for patients, parents, and caregivers prior to infusion.   Complications: No immediate complications noted  Discharge: Discharged home    

## 2020-07-03 DIAGNOSIS — Z85828 Personal history of other malignant neoplasm of skin: Secondary | ICD-10-CM | POA: Diagnosis not present

## 2020-07-03 DIAGNOSIS — L918 Other hypertrophic disorders of the skin: Secondary | ICD-10-CM | POA: Diagnosis not present

## 2020-07-03 DIAGNOSIS — L821 Other seborrheic keratosis: Secondary | ICD-10-CM | POA: Diagnosis not present

## 2020-08-10 DIAGNOSIS — C61 Malignant neoplasm of prostate: Secondary | ICD-10-CM | POA: Diagnosis not present

## 2020-08-15 DIAGNOSIS — Z87440 Personal history of urinary (tract) infections: Secondary | ICD-10-CM | POA: Diagnosis not present

## 2020-08-15 DIAGNOSIS — N3942 Incontinence without sensory awareness: Secondary | ICD-10-CM | POA: Diagnosis not present

## 2020-08-15 DIAGNOSIS — C61 Malignant neoplasm of prostate: Secondary | ICD-10-CM | POA: Diagnosis not present

## 2020-08-22 ENCOUNTER — Other Ambulatory Visit (HOSPITAL_COMMUNITY): Payer: Self-pay | Admitting: Urology

## 2020-08-22 DIAGNOSIS — C61 Malignant neoplasm of prostate: Secondary | ICD-10-CM

## 2020-08-22 DIAGNOSIS — R9721 Rising PSA following treatment for malignant neoplasm of prostate: Secondary | ICD-10-CM

## 2020-08-31 ENCOUNTER — Other Ambulatory Visit: Payer: Self-pay

## 2020-08-31 ENCOUNTER — Ambulatory Visit (HOSPITAL_COMMUNITY)
Admission: RE | Admit: 2020-08-31 | Discharge: 2020-08-31 | Disposition: A | Discharge status: Home / Self Care | Payer: PPO | Source: Ambulatory Visit | Attending: Urology | Admitting: Urology

## 2020-08-31 DIAGNOSIS — C61 Malignant neoplasm of prostate: Secondary | ICD-10-CM | POA: Diagnosis not present

## 2020-08-31 DIAGNOSIS — R9721 Rising PSA following treatment for malignant neoplasm of prostate: Secondary | ICD-10-CM

## 2020-08-31 MED ORDER — PIFLIFOLASTAT F 18 (PYLARIFY) INJECTION
9.0000 | Freq: Once | INTRAVENOUS | Status: AC
  Administered 2020-08-31: 9.7 via INTRAVENOUS

## 2020-09-10 ENCOUNTER — Other Ambulatory Visit (HOSPITAL_COMMUNITY): Payer: Self-pay | Admitting: Urology

## 2020-09-10 ENCOUNTER — Other Ambulatory Visit: Payer: Self-pay | Admitting: Urology

## 2020-09-10 DIAGNOSIS — R9721 Rising PSA following treatment for malignant neoplasm of prostate: Secondary | ICD-10-CM

## 2020-09-18 ENCOUNTER — Ambulatory Visit (HOSPITAL_COMMUNITY): Payer: PPO

## 2020-09-20 ENCOUNTER — Ambulatory Visit (HOSPITAL_COMMUNITY)
Admission: RE | Admit: 2020-09-20 | Discharge: 2020-09-20 | Disposition: A | Discharge status: Home / Self Care | Payer: PPO | Source: Ambulatory Visit | Attending: Urology | Admitting: Urology

## 2020-09-20 ENCOUNTER — Other Ambulatory Visit: Payer: Self-pay

## 2020-09-20 DIAGNOSIS — R9721 Rising PSA following treatment for malignant neoplasm of prostate: Secondary | ICD-10-CM

## 2020-09-20 DIAGNOSIS — R590 Localized enlarged lymph nodes: Secondary | ICD-10-CM | POA: Diagnosis not present

## 2020-09-20 DIAGNOSIS — Z8546 Personal history of malignant neoplasm of prostate: Secondary | ICD-10-CM | POA: Diagnosis not present

## 2020-09-27 ENCOUNTER — Other Ambulatory Visit (HOSPITAL_COMMUNITY): Payer: Self-pay | Admitting: Urology

## 2020-09-27 DIAGNOSIS — R590 Localized enlarged lymph nodes: Secondary | ICD-10-CM

## 2020-09-27 DIAGNOSIS — C61 Malignant neoplasm of prostate: Secondary | ICD-10-CM

## 2020-09-28 ENCOUNTER — Encounter (HOSPITAL_COMMUNITY): Payer: Self-pay

## 2020-09-28 NOTE — Progress Notes (Unsigned)
       Patient Demographics  Patient Name  Nicholas Daniels, Nicholas Daniels Legal Sex  Male DOB  [birth year] SSN  [id number] Address  [place] [address] Phone  [phone] [facility])  [phone] (Mobile) *Preferred*     RE: Biopsy Received: Scarlette Ar, MD  [place] [place] E Approved for ultrasound guided left cervical lymph node biopsy.   [name]         Previous Messages    ----- Message -----  From: Lenore Cordia  Sent: 09/27/2020   1:47 PM EDT  To: Ir Procedure Requests  Subject: Biopsy                                         Procedure Requested:  US Biopsy    Reason for Procedure:  enlarged left cervical lymph nodes, hx of prostate cancer    Provider Requesting:  Dr Irine Seal  Provider Telephone:  [phone]    In Providers comments: I communicated with Dr Kathlene Cote  about this

## 2020-10-04 ENCOUNTER — Other Ambulatory Visit: Payer: Self-pay | Admitting: Radiology

## 2020-10-05 ENCOUNTER — Encounter (HOSPITAL_COMMUNITY): Payer: Self-pay

## 2020-10-05 ENCOUNTER — Other Ambulatory Visit: Payer: Self-pay

## 2020-10-05 ENCOUNTER — Ambulatory Visit (HOSPITAL_COMMUNITY)
Admission: RE | Admit: 2020-10-05 | Discharge: 2020-10-05 | Disposition: A | Discharge status: Home / Self Care | Payer: PPO | Source: Ambulatory Visit | Attending: Urology | Admitting: Urology

## 2020-10-05 DIAGNOSIS — Z8546 Personal history of malignant neoplasm of prostate: Secondary | ICD-10-CM | POA: Diagnosis not present

## 2020-10-05 DIAGNOSIS — C770 Secondary and unspecified malignant neoplasm of lymph nodes of head, face and neck: Secondary | ICD-10-CM | POA: Insufficient documentation

## 2020-10-05 DIAGNOSIS — R590 Localized enlarged lymph nodes: Secondary | ICD-10-CM | POA: Diagnosis not present

## 2020-10-05 DIAGNOSIS — C61 Malignant neoplasm of prostate: Secondary | ICD-10-CM

## 2020-10-05 LAB — CBC WITH DIFFERENTIAL/PLATELET
Abs Immature Granulocytes: 0.03 10*3/uL (ref 0.00–0.07)
Basophils Absolute: 0 10*3/uL (ref 0.0–0.1)
Basophils Relative: 0 %
Eosinophils Absolute: 0.4 10*3/uL (ref 0.0–0.5)
Eosinophils Relative: 6 %
HCT: 46 % (ref 39.0–52.0)
Hemoglobin: 15.4 g/dL (ref 13.0–17.0)
Immature Granulocytes: 1 %
Lymphocytes Relative: 26 %
Lymphs Abs: 1.5 10*3/uL (ref 0.7–4.0)
MCH: 29.9 pg (ref 26.0–34.0)
MCHC: 33.5 g/dL (ref 30.0–36.0)
MCV: 89.3 fL (ref 80.0–100.0)
Monocytes Absolute: 0.7 10*3/uL (ref 0.1–1.0)
Monocytes Relative: 12 %
Neutro Abs: 3.2 10*3/uL (ref 1.7–7.7)
Neutrophils Relative %: 55 %
Platelets: 304 10*3/uL (ref 150–400)
RBC: 5.15 MIL/uL (ref 4.22–5.81)
RDW: 13 % (ref 11.5–15.5)
WBC: 5.7 10*3/uL (ref 4.0–10.5)
nRBC: 0 % (ref 0.0–0.2)

## 2020-10-05 LAB — BASIC METABOLIC PANEL
Anion gap: 5 (ref 5–15)
BUN: 13 mg/dL (ref 8–23)
CO2: 30 mmol/L (ref 22–32)
Calcium: 9.2 mg/dL (ref 8.9–10.3)
Chloride: 106 mmol/L (ref 98–111)
Creatinine, Ser: 1.14 mg/dL (ref 0.61–1.24)
GFR, Estimated: >60 mL/min (ref >60)
Glucose, Bld: 103 mg/dL — ABNORMAL HIGH (ref 70–99)
Potassium: 4 mmol/L (ref 3.5–5.1)
Sodium: 141 mmol/L (ref 135–145)

## 2020-10-05 LAB — PROTIME-INR
INR: 0.9 (ref 0.8–1.2)
Prothrombin Time: 12.3 seconds (ref 11.4–15.2)

## 2020-10-05 MED ORDER — MIDAZOLAM HCL 2 MG/2ML IJ SOLN
INTRAMUSCULAR | Status: AC | PRN
  Administered 2020-10-05: 1 mg via INTRAVENOUS

## 2020-10-05 MED ORDER — FENTANYL CITRATE (PF) 100 MCG/2ML IJ SOLN
INTRAMUSCULAR | Status: AC
  Filled 2020-10-05: qty 2

## 2020-10-05 MED ORDER — LIDOCAINE-EPINEPHRINE (PF) 2 %-1:200000 IJ SOLN
INTRAMUSCULAR | Status: AC
  Filled 2020-10-05: qty 20

## 2020-10-05 MED ORDER — FENTANYL CITRATE (PF) 100 MCG/2ML IJ SOLN
INTRAMUSCULAR | Status: AC | PRN
  Administered 2020-10-05: 50 ug via INTRAVENOUS

## 2020-10-05 MED ORDER — LIDOCAINE-EPINEPHRINE 1 %-1:100000 IJ SOLN
INTRAMUSCULAR | Status: AC | PRN
  Administered 2020-10-05: 10 mL via INTRADERMAL

## 2020-10-05 MED ORDER — SODIUM CHLORIDE 0.9 % IV SOLN: INTRAVENOUS | Status: DC

## 2020-10-05 MED ORDER — MIDAZOLAM HCL 2 MG/2ML IJ SOLN
INTRAMUSCULAR | Status: AC
  Filled 2020-10-05: qty 2

## 2020-10-05 MED ORDER — SODIUM CHLORIDE 0.9 % IV SOLN
INTRAVENOUS | Status: AC
  Filled 2020-10-05: qty 250

## 2020-10-05 NOTE — Procedures (Signed)
Pre Procedure Dx: Left cervical lymphadenopathy Post Procedural Dx: Same  Technically successful US guided biopsy of indeterminate left cervical lymph node.  EBL: None No immediate complications.   Jay Destin Vinsant, MD Pager #: [phone]   

## 2020-10-05 NOTE — H&P (Signed)
Chief Complaint: Patient was seen in consultation today for LN biopsy at the request of [name],[name]  Referring Physician(s): Irine Seal  Supervising Physician: Sandi Mariscal  Patient Status: [facility] - Out-pt  History of Present Illness: Nicholas Daniels is a 70 y.o. male with hx of prostate cancer found to have enlarged left neck cervical lymph nodes. He is referred for image guided biopsy. PMHx, meds, labs, imaging, allergies reviewed. Feels well, no recent fevers, chills, illness. Has been NPO today as directed.   Past Medical History:  Diagnosis Date   Cataract    removed bilateraly   Cough    Glaucoma    Hypertension    PFO (patent foramen ovale)    PFO noted on coronary CT 09/05/14   Prostate cancer ([place]) 2009    Past Surgical History:  Procedure Laterality Date   CATARACT EXTRACTION     bilateral and a wrinkle on retina on the left    CHOLECYSTECTOMY     COLONOSCOPY     PROSTATE SURGERY  2009   radiation treatment     RESECTION OF MEDIASTINAL MASS N/A 02/26/2015   Procedure: RESECTION OF MEDIASTINAL MASS;  Surgeon: Ivin Poot, MD;  Location: [place];  Service: Thoracic;  Laterality: N/A;   STERNOTOMY N/A 02/26/2015   Procedure: STERNOTOMY;  Surgeon: Ivin Poot, MD;  Location: [place];  Service: Thoracic;  Laterality: N/A;   UPPER GASTROINTESTINAL ENDOSCOPY      Allergies: Patient has no known allergies.  Medications: Prior to Admission medications   Medication Sig Start Date End Date Taking? Authorizing Provider  AMBULATORY NON FORMULARY MEDICATION Dexamethasone proportionte    [provider]  aspirin 325 MG tablet Take 325 mg by mouth daily.    [provider]  calcium-vitamin D (CALCIUM 500/D) 500-200 MG-UNIT tablet Take 1 tablet by mouth.    [provider]  diltiazem (CARTIA XT) 120 MG 24 hr capsule Take 120 mg by mouth daily.    [provider]  Flaxseed, Linseed, (FLAXSEED OIL PO) Take 2,800 mg by mouth daily.     [provider]  latanoprost (XALATAN) 0.005 % ophthalmic solution Place 1 drop into both eyes at bedtime.    [provider]  Multiple Vitamin (MULTIVITAMIN) tablet Take 1 tablet by mouth daily.    [provider]  Niacin CR 250 MG TBCR Take 250 mg by mouth at bedtime.     [provider]  omeprazole (PRILOSEC) 40 MG capsule TAKE 1 CAPSULE BY MOUTH EVERY DAY 11/21/19   Nandigam, Venia Minks, MD  sucralfate (CARAFATE) 1 g tablet Take 1 tablet (1 g total) by mouth 2 (two) times daily. 11/21/19   Mauri Pole, MD  timolol (TIMOPTIC) 0.5 % ophthalmic solution Place 1 drop into both eyes daily.    [provider]  Vitamin D, Cholecalciferol, 50 MCG (2000 UT) CAPS Take by mouth.    [provider]     Family History  Problem Relation Age of Onset   Heart disease Brother    COPD Brother    Cancer Brother        liver   Liver cancer Brother    Heart disease Mother        MI   COPD Mother    Pneumonia Mother    Diabetes Mother    COPD Father    Colon polyps Other    Esophageal cancer Neg Hx    Rectal cancer Neg Hx    Stomach  cancer Neg Hx     Social History   Socioeconomic History   Marital status: Married    Spouse name: Not on file   Number of children: Not on file   Years of education: Not on file   Highest education level: Not on file  Occupational History   Not on file  Tobacco Use   Smoking status: Never   Smokeless tobacco: Never  Vaping Use   Vaping Use: Never used  Substance and Sexual Activity   Alcohol use: No   Drug use: No   Sexual activity: Not on file  Other Topics Concern   Not on file  Social History Narrative   Not on file   Social Determinants of Health   Financial Resource Strain: Not on file  Food Insecurity: Not on file  Transportation Needs: Not on file  Physical Activity: Not on file  Stress: Not on file  Social Connections: Not on file     Review of Systems: A 12 point ROS  discussed and pertinent positives are indicated in the HPI above.  All other systems are negative.  Review of Systems  Vital Signs: There were no vitals taken for this visit.  Physical Exam Constitutional:      Appearance: Normal appearance. He is not ill-appearing.  Cardiovascular:     Rate and Rhythm: Normal rate and regular rhythm.     Heart sounds: Normal heart sounds.  Pulmonary:     Effort: Pulmonary effort is normal. No respiratory distress.     Breath sounds: Normal breath sounds.  Skin:    General: Skin is warm and dry.  Neurological:     General: No focal deficit present.     Mental Status: He is alert and oriented to person, place, and time.  Psychiatric:        Thought Content: Thought content normal.        Judgment: Judgment normal.     Imaging: US SOFT TISSUE HEAD & NECK (NON-THYROID)  Result Date: 09/21/2020 CLINICAL DATA:  Increasing PSA level after treatment for prostate cancer. Enlarged left lymph node seen on PET. EXAM: ULTRASOUND OF HEAD/NECK SOFT TISSUES TECHNIQUE: Ultrasound examination of the neck soft tissues was performed in the area of clinical concern. COMPARISON:  PET scan 08/31/2020. FINDINGS: Targeted ultrasound was performed in the left upper neck region of concern. At this site, there are two adjacent ovoid hypoechoic foci measuring 2.3 x 1.4 x 1.9 cm and 1.5 x 0.9 x 1.5 cm. Both foci demonstrate internal flow on color Doppler. These foci are most compatible with enlarged lymph nodes. No definite fatty hilum is identified within the larger lymph node. A probable fatty hilum is identified within the smaller of the 2 lymph nodes. These results will be called to the ordering clinician or representative by the Radiologist Assistant, and communication documented in the PACS or [employer]. IMPRESSION: Two adjacent enlarged lymph nodes within the left upper neck region of concern, likely corresponding with the left level 2 lymphadenopathy demonstrated on  the recent prior PET-CT of 08/31/2020. Although nonspecific, worrisome etiologies such as nodal metastatic disease cannot be excluded. Consider direct tissue sampling. Electronically Signed   By: Kellie Simmering DO   On: 09/21/2020 08:21    Labs:  CBC: Recent Labs    10/05/20 1130  WBC 5.7  HGB 15.4  HCT 46.0  PLT 304    Assessment and Plan: Enlarged left neck cervical lymph nodes Hx of prostate cancer Plan for US guided  biopsy Risks and benefits of left cervical LN bx was discussed with the patient and/or patient's family including, but not limited to bleeding, infection, damage to adjacent structures or low yield requiring additional tests.  All of the questions were answered and there is agreement to proceed.  Consent signed and in chart.    Thank you for this interesting consult.  I greatly enjoyed meeting Essa M Gorelik and look forward to participating in their care.  A copy of this report was sent to the requesting provider on this date.  Electronically Signed: Ascencion Dike, PA-C 10/05/2020, 11:44 AM   I spent a total of 20 minutes in face to face in clinical consultation, greater than 50% of which was counseling/coordinating care for left cervical LN bx

## 2020-10-05 NOTE — Discharge Instructions (Signed)
Interventional radiology phone numbers ?[phone] ?After hours [phone] ? ?Needle Biopsy, Care After ?These instructions tell you how to care for yourself after your procedure. Your doctor may also give you more specific instructions. Call your doctor if you have any problems or questions. ?What can I expect after the procedure? ?After the procedure, it is common to have: ?Soreness. ?Bruising. ?Mild pain. ?Follow these instructions at home: ?Return to your normal activities as told by your doctor. Ask your doctor what activities are safe for you. ?Take over-the-counter and prescription medicines only as told by your doctor. ?Wash your hands with soap and water before you change your bandage (dressing). If you cannot use soap and water, use hand sanitizer. ?Follow instructions from your doctor about: ?How to take care of your puncture site. ?When and how to change your bandage. ?When to remove your bandage. ?Check your puncture site every day for signs of infection. Watch for: ?Redness, swelling, or pain. ?Fluid or blood. ?Pus or a bad smell. ?Warmth. ?Do not take baths, swim, or use a hot tub until your doctor approves. You may remove your dressing tomorrow and shower. ?Keep all follow-up visits as told by your doctor. This is important.   ?Contact a doctor if you have: ?A fever. ?Redness, swelling, or pain at the puncture site, and it lasts longer than a few days. ?Fluid, blood, or pus coming from the puncture site. ?Warmth coming from the puncture site. ?Get help right away if: ?You have a lot of bleeding from the puncture site. ?Summary ?After the procedure, it is common to have soreness, bruising, or mild pain at the puncture site. ?Check your puncture site every day for signs of infection, such as redness, swelling, or pain. ?Get help right away if you have severe bleeding from your puncture site. ?This information is not intended to replace advice given to you by your health care provider. Make sure you  discuss any questions you have with your health care provider. ?Document Revised: 09/22/2019 Document Reviewed: 09/22/2019 ?Elsevier Patient Education ? 2021 Elsevier Inc. ? ? ? ? ?Moderate Conscious Sedation, Adult, Care After ?This sheet gives you information about how to care for yourself after your procedure. Your health care provider may also give you more specific instructions. If you have problems or questions, contact your health care provider. ?What can I expect after the procedure? ?After the procedure, it is common to have: ?Sleepiness for several hours. ?Impaired judgment for several hours. ?Difficulty with balance. ?Vomiting if you eat too soon. ?Follow these instructions at home: ?For the time period you were told by your health care provider: ?Rest. ?Do not participate in activities where you could fall or become injured. ?Do not drive or use machinery. ?Do not drink alcohol. ?Do not take sleeping pills or medicines that cause drowsiness. ?Do not make important decisions or sign legal documents. ?Do not take care of children on your own.  ?  ?  ?Eating and drinking ?Follow the diet recommended by your health care provider. ?Drink enough fluid to keep your urine pale yellow. ?If you vomit: ?Drink water, juice, or soup when you can drink without vomiting. ?Make sure you have little or no nausea before eating solid foods.   ?General instructions ?Take over-the-counter and prescription medicines only as told by your health care provider. ?Have a responsible adult stay with you for the time you are told. It is important to have someone help care for you until you are awake and alert. ?Do not smoke. ?  Keep all follow-up visits as told by your health care provider. This is important. ?Contact a health care provider if: ?You are still sleepy or having trouble with balance after 24 hours. ?You feel light-headed. ?You keep feeling nauseous or you keep vomiting. ?You develop a rash. ?You have a fever. ?You have  redness or swelling around the IV site. ?Get help right away if: ?You have trouble breathing. ?You have new-onset confusion at home. ?Summary ?After the procedure, it is common to feel sleepy, have impaired judgment, or feel nauseous if you eat too soon. ?Rest after you get home. Know the things you should not do after the procedure. ?Follow the diet recommended by your health care provider and drink enough fluid to keep your urine pale yellow. ?Get help right away if you have trouble breathing or new-onset confusion at home. ?This information is not intended to replace advice given to you by your health care provider. Make sure you discuss any questions you have with your health care provider. ?Document Revised: 07/22/2019 Document Reviewed: 02/17/2019 ?Elsevier Patient Education ? 2021 Elsevier Inc.  ?

## 2020-10-10 LAB — SURGICAL PATHOLOGY

## 2020-10-18 ENCOUNTER — Encounter (INDEPENDENT_AMBULATORY_CARE_PROVIDER_SITE_OTHER): Payer: PPO | Admitting: Ophthalmology

## 2020-10-18 ENCOUNTER — Telehealth: Payer: Self-pay | Admitting: Oncology

## 2020-10-18 NOTE — Telephone Encounter (Signed)
Received a new pt referral from Dr. Jeffie Pollock for hx of prostate cancer for the findings on lymph node biopsy and his rising PSA with prior local therapy. [name] has been cld and scheduled to see Dr. Alen Blew on 7/19 at 2pm. Pt aware to arrive 15 minutes early.

## 2020-10-23 ENCOUNTER — Telehealth: Payer: Self-pay | Admitting: *Deleted

## 2020-10-23 ENCOUNTER — Other Ambulatory Visit: Payer: Self-pay

## 2020-10-23 ENCOUNTER — Inpatient Hospital Stay: Payer: PPO | Attending: Oncology | Admitting: Oncology

## 2020-10-23 VITALS — BP 128/74 | HR 66 | Temp 97.8°F | Resp 18 | Ht 67.0 in | Wt 202.8 lb

## 2020-10-23 DIAGNOSIS — C801 Malignant (primary) neoplasm, unspecified: Secondary | ICD-10-CM

## 2020-10-23 DIAGNOSIS — C779 Secondary and unspecified malignant neoplasm of lymph node, unspecified: Secondary | ICD-10-CM

## 2020-10-23 DIAGNOSIS — R590 Localized enlarged lymph nodes: Secondary | ICD-10-CM

## 2020-10-23 DIAGNOSIS — Z8546 Personal history of malignant neoplasm of prostate: Secondary | ICD-10-CM | POA: Diagnosis not present

## 2020-10-23 NOTE — Progress Notes (Signed)
Reason for the request:    Lymphadenopathy  HPI: I was asked by Dr. Jeffie Pollock to evaluate Nicholas Daniels for the evaluation of cervical adenopathy.  He is a 70 year old man with history of prostate cancer diagnosed in 2009.  He underwent prostatectomy and found to have a T3a N0 Gleason score 3+4 = 7 cancer.  He underwent salvage radiation in 2012.  He has subsequently developed a rise in his PSA and enrolled in the [employer] trial.  He has been followed by Dr. Jeffie Pollock recently.  He was off enzalutamide since August 2021 and off clinical trial at that time.  His PSA started to rise in the last year with a PSA in May 2022 was 0.56.  Previously was 0.35 and it was undetectable in May 2021.  Prior to enzalutamide his PSA was as high as 8.27 and declined to undetectable level.  Given the rise of his PSA he underwent staging scans including PSMA PET on Aug 31, 2020.  No activity noted any chest abdomen and pelvis.  There was an enlarged left cervical lymph node without any PSMA activity that is concerning for malignancy.  Ultrasound obtained which showed due to adjacent enlarged lymph node within the left upper neck region concerning for level 2 lymphadenopathy.  Percutaneous biopsy obtained on October 06, 2019 which showed a poorly differentiated carcinoma with immunohistochemical staining is positive for cytokeratin AE1/AE3 and cytokeratin 7.  It was negative for PSA.  Cytokeratin 20, CDX2 [place] and TTF-1 are also negative.  The primary remains unclear with GU, GI and lung primary are consideration.   Clinically, he is asymptomatic from these findings.  He denies any neck pain or discomfort.  He denies any constitutional symptoms.  His performance status and quality of life remains unchanged.   He does not report any headaches, blurry vision, syncope or seizures. Does not report any fevers, chills or sweats.  Does not report any cough, wheezing or hemoptysis.  Does not report any chest pain, palpitation, orthopnea or leg  edema.  Does not report any nausea, vomiting or abdominal pain.  Does not report any constipation or diarrhea.  Does not report any skeletal complaints.    Does not report frequency, urgency or hematuria.  Does not report any skin rashes or lesions. Does not report any heat or cold intolerance.  Does not report any lymphadenopathy or petechiae.  Does not report any anxiety or depression.  Remaining review of systems is negative.     Past Medical History:  Diagnosis Date   Cataract    removed bilateraly   Cough    Glaucoma    Hypertension    PFO (patent foramen ovale)    PFO noted on coronary CT 09/05/14   Prostate cancer [facility]) 2009  :   Past Surgical History:  Procedure Laterality Date   CATARACT EXTRACTION     bilateral and a wrinkle on retina on the left    CHOLECYSTECTOMY     COLONOSCOPY     PROSTATE SURGERY  2009   radiation treatment     RESECTION OF MEDIASTINAL MASS N/A 02/26/2015   Procedure: RESECTION OF MEDIASTINAL MASS;  Surgeon: Ivin Poot, MD;  Location: [place];  Service: Thoracic;  Laterality: N/A;   STERNOTOMY N/A 02/26/2015   Procedure: STERNOTOMY;  Surgeon: Ivin Poot, MD;  Location: [place];  Service: Thoracic;  Laterality: N/A;   UPPER GASTROINTESTINAL ENDOSCOPY    :   Current Outpatient Medications:    AMBULATORY NON FORMULARY MEDICATION,  Dexamethasone proportionte, Disp: , Rfl:    aspirin 325 MG tablet, Take 325 mg by mouth daily., Disp: , Rfl:    calcium-vitamin D (CALCIUM 500/D) 500-200 MG-UNIT tablet, Take 1 tablet by mouth., Disp: , Rfl:    diltiazem (CARDIZEM CD) 120 MG 24 hr capsule, Take 120 mg by mouth daily., Disp: , Rfl:    Flaxseed, Linseed, (FLAXSEED OIL PO), Take 2,800 mg by mouth daily., Disp: , Rfl:    latanoprost (XALATAN) 0.005 % ophthalmic solution, Place 1 drop into both eyes at bedtime., Disp: , Rfl:    Multiple Vitamin (MULTIVITAMIN) tablet, Take 1 tablet by mouth daily., Disp: , Rfl:    Niacin CR 250 MG TBCR, Take 250 mg by  mouth at bedtime. , Disp: , Rfl:    omeprazole (PRILOSEC) 40 MG capsule, TAKE 1 CAPSULE BY MOUTH EVERY DAY, Disp: 90 capsule, Rfl: 3   sucralfate (CARAFATE) 1 g tablet, Take 1 tablet (1 g total) by mouth 2 (two) times daily., Disp: 60 tablet, Rfl: 3   timolol (TIMOPTIC) 0.5 % ophthalmic solution, Place 1 drop into both eyes daily., Disp: , Rfl:    Vitamin D, Cholecalciferol, 55 MCG (2000 UT) CAPS, Take by mouth., Disp: , Rfl: :  No Known Allergies:   Family History  Problem Relation Age of Onset   Heart disease Brother    COPD Brother    Cancer Brother        liver   Liver cancer Brother    Heart disease Mother        MI   COPD Mother    Pneumonia Mother    Diabetes Mother    COPD Father    Colon polyps Other    Esophageal cancer Neg Hx    Rectal cancer Neg Hx    Stomach cancer Neg Hx   :   Social History   Socioeconomic History   Marital status: Married    Spouse name: Not on file   Number of children: Not on file   Years of education: Not on file   Highest education level: Not on file  Occupational History   Not on file  Tobacco Use   Smoking status: Never   Smokeless tobacco: Never  Vaping Use   Vaping Use: Never used  Substance and Sexual Activity   Alcohol use: No   Drug use: No   Sexual activity: Not on file  Other Topics Concern   Not on file  Social History Narrative   Not on file   Social Determinants of Health   Financial Resource Strain: Not on file  Food Insecurity: Not on file  Transportation Needs: Not on file  Physical Activity: Not on file  Stress: Not on file  Social Connections: Not on file  Intimate Partner Violence: Not on file  :  Pertinent items are noted in HPI.  Exam: Blood pressure 128/74, pulse 66, temperature 97.8 F (36.6 C), resp. rate 18, height 5\' 7"  (1.702 m), weight 202 lb 12.8 oz (92 kg), SpO2 95 %. ECOG 0 General appearance: alert and cooperative appeared without distress. Head: atraumatic without any  abnormalities. Eyes: conjunctivae/corneas clear. PERRL.  Sclera anicteric. Throat: lips, mucosa, and tongue normal; without oral thrush or ulcers. Resp: clear to auscultation bilaterally without rhonchi, wheezes or dullness to percussion. Cardio: regular rate and rhythm, S1, S2 normal, no murmur, click, rub or gallop GI: soft, non-tender; bowel sounds normal; no masses,  no organomegaly Skin: Skin color, texture, turgor normal. No rashes  or lesions Lymph nodes: Cervical, supraclavicular, and axillary nodes normal. Neurologic: Grossly normal without any motor, sensory or deep tendon reflexes. Musculoskeletal: No joint deformity or effusion.   [place] CORE BIOPSY (LYMPH NODES)  Result Date: 10/05/2020 INDICATION: History of prostate cancer, now with indeterminate left cervical lymphadenopathy. Please perform ultrasound-guided biopsy for tissue diagnostic purposes. EXAM: ULTRASOUND-GUIDED LEFT CERVICAL LYMPH NODE BIOPSY COMPARISON:  PSMA PET-CT-08/31/2020; soft tissue neck ultrasound-09/20/2020 MEDICATIONS: None ANESTHESIA/SEDATION: Moderate (conscious) sedation was employed during this procedure. A total of Versed 2 mg and Fentanyl 100 mcg was administered intravenously. Moderate Sedation Time: 10 minutes. The patient's level of consciousness and vital signs were monitored continuously by radiology nursing throughout the procedure under my direct supervision. COMPLICATIONS: None immediate. TECHNIQUE: Informed written consent was obtained from the patient after a discussion of the risks, benefits and alternatives to treatment. Questions regarding the procedure were encouraged and answered. Initial ultrasound scanning demonstrated an approximately 1.9 x 1.8 x 1.6 cm hypoechoic left cervical lymph node (images 2 through 5) correlating with the lymph node seen on preceding PET-CT image 50, series 4. an ultrasound image was saved for documentation purposes. The procedure was planned. A timeout was performed prior  to the initiation of the procedure. The operative was prepped and draped in the usual sterile fashion, and a sterile drape was applied covering the operative field. A timeout was performed prior to the initiation of the procedure. Local anesthesia was provided with 1% lidocaine with epinephrine. Under direct ultrasound guidance, an 18 gauge core needle device was utilized to obtain to obtain 6 core needle biopsies of the indeterminate left cervical lymph node. The samples were placed in saline and submitted to pathology. The needle was removed and hemostasis was achieved with manual compression. Post procedure scan was negative for significant hematoma. A dressing was placed. The patient tolerated the procedure well without immediate postprocedural complication. IMPRESSION: Technically successful ultrasound guided biopsy of indeterminate left cervical lymph node. Electronically Signed   By: Sandi Mariscal M.D.   On: 10/05/2020 14:52    Assessment and Plan:    70 year old with:  1.  Left cervical lymphadenopathy that is biopsy-proven to show poorly differentiated carcinoma confirmed on October 05, 2020.  Immunohistochemical stains were none specific for a primary.  The differential diagnosis include GI, GU versus lung malignancy.  PSA staining on the pathology is negative with PSMA imaging showed no activity.  Management options at this time were discussed.  These would include systemic therapy utilizing chemotherapy dedicated for unknown primary.  Radiation therapy or primary surgical resection to obtain more tissue could also be considered.  Lymph node dissection may be a reasonable approach at this point which will serve as a both diagnostic and therapeutic approach.  After discussion today, he favors proceeding with surgery as weight will provide additional information regarding this malignancy.  I will make the appropriate referral to ENT for an evaluation at this time.  2.  Prostate cancer: Diagnosed in  2009 with Gleason score of 7 and status post prostatectomy.  He developed relapsed disease in 2012 and received salvage radiation therapy.  He also received enzalutamide as a part of the empiric trial and currently off of it.  His PSA is slowly rising.  It is unclear whether the cervical adenopathy and poorly differentiated carcinoma is related to prostate cancer versus two different entities.  At this time, I recommended continued active surveillance and reserve additional systemic therapy in the form of abiraterone, systemic chemotherapy to a later date.  3.  Follow-up: will be after completing his surgical lymph node resection.  60  minutes were dedicated to this visit. The time was spent on reviewing laboratory data, imaging studies, discussing treatment options, discussing differential diagnosis and answering questions regarding future plan.    A copy of this consult has been forwarded to the requesting physician.

## 2020-10-23 NOTE — Telephone Encounter (Signed)
Patient's demographic sheet, MD progress note & referral request faxed to [facility] [phone].  Fax confirmation received.

## 2020-11-05 ENCOUNTER — Telehealth: Payer: Self-pay | Admitting: Oncology

## 2020-11-05 NOTE — Telephone Encounter (Signed)
Scheduled per 07/19 los, patient has been called and notified. 

## 2020-11-08 ENCOUNTER — Encounter (INDEPENDENT_AMBULATORY_CARE_PROVIDER_SITE_OTHER): Payer: Self-pay | Admitting: Ophthalmology

## 2020-11-08 ENCOUNTER — Ambulatory Visit (INDEPENDENT_AMBULATORY_CARE_PROVIDER_SITE_OTHER): Payer: PPO | Admitting: Ophthalmology

## 2020-11-08 ENCOUNTER — Other Ambulatory Visit: Payer: Self-pay

## 2020-11-08 DIAGNOSIS — H35372 Puckering of macula, left eye: Secondary | ICD-10-CM | POA: Diagnosis not present

## 2020-11-08 DIAGNOSIS — H33102 Unspecified retinoschisis, left eye: Secondary | ICD-10-CM | POA: Diagnosis not present

## 2020-11-08 DIAGNOSIS — H35371 Puckering of macula, right eye: Secondary | ICD-10-CM | POA: Diagnosis not present

## 2020-11-08 DIAGNOSIS — H353131 Nonexudative age-related macular degeneration, bilateral, early dry stage: Secondary | ICD-10-CM

## 2020-11-08 NOTE — Progress Notes (Signed)
11/08/2020     CHIEF COMPLAINT Patient presents for Retina Follow Up (6 month fu OU and OCT/Pt states VA OU stable since last visit. Pt denies FOL, floaters, or ocular pain OU. /Pt states reports using Timolol QAM OU and Latanoprost QHS OU/)   HISTORY OF PRESENT ILLNESS: Nicholas Daniels is a 70 y.o. male who presents to the clinic today for:   HPI     Retina Follow Up           Diagnosis: Other   Laterality: both eyes   Onset: 6 months ago   Severity: mild   Duration: 6 months   Course: stable   Comments: 6 month fu OU and OCT Pt states VA OU stable since last visit. Pt denies FOL, floaters, or ocular pain OU.  Pt states reports using Timolol QAM OU and Latanoprost QHS OU        Last edited by Kendra Opitz, COA on 11/08/2020  1:45 PM.      Referring physician: Leanna Battles, [place],  [place] [id number]  HISTORICAL INFORMATION:   Selected notes from the MEDICAL RECORD NUMBER       CURRENT MEDICATIONS: Current Outpatient Medications (Ophthalmic Drugs)  Medication Sig   latanoprost (XALATAN) 0.005 % ophthalmic solution Place 1 drop into both eyes at bedtime.   timolol (TIMOPTIC) 0.5 % ophthalmic solution Place 1 drop into both eyes daily.   No current facility-administered medications for this visit. (Ophthalmic Drugs)   Current Outpatient Medications (Other)  Medication Sig   AMBULATORY NON FORMULARY MEDICATION Dexamethasone proportionte   aspirin 325 MG tablet Take 325 mg by mouth daily.   calcium-vitamin D (CALCIUM 500/D) 500-200 MG-UNIT tablet Take 1 tablet by mouth.   diltiazem (CARDIZEM CD) 120 MG 24 hr capsule Take 120 mg by mouth daily.   Flaxseed, Linseed, (FLAXSEED OIL PO) Take 2,800 mg by mouth daily.   Multiple Vitamin (MULTIVITAMIN) tablet Take 1 tablet by mouth daily.   Niacin CR 250 MG TBCR Take 250 mg by mouth at bedtime.    omeprazole (PRILOSEC) 40 MG capsule TAKE 1 CAPSULE BY MOUTH EVERY DAY   sucralfate (CARAFATE) 1 g  tablet Take 1 tablet (1 g total) by mouth 2 (two) times daily.   Vitamin D, Cholecalciferol, 50 MCG (2000 UT) CAPS Take by mouth.   No current facility-administered medications for this visit. (Other)      REVIEW OF SYSTEMS:    ALLERGIES No Known Allergies  PAST MEDICAL HISTORY Past Medical History:  Diagnosis Date   Cataract    removed bilateraly   Cough    Glaucoma    Hypertension    PFO (patent foramen ovale)    PFO noted on coronary CT 09/05/14   Prostate cancer ([place]) 2009   Past Surgical History:  Procedure Laterality Date   CATARACT EXTRACTION     bilateral and a wrinkle on retina on the left    CHOLECYSTECTOMY     COLONOSCOPY     PROSTATE SURGERY  2009   radiation treatment     RESECTION OF MEDIASTINAL MASS N/A 02/26/2015   Procedure: RESECTION OF MEDIASTINAL MASS;  Surgeon: Ivin Poot, MD;  Location: [facility];  Service: Thoracic;  Laterality: N/A;   STERNOTOMY N/A 02/26/2015   Procedure: STERNOTOMY;  Surgeon: Ivin Poot, MD;  Location: [facility];  Service: Thoracic;  Laterality: N/A;   UPPER GASTROINTESTINAL ENDOSCOPY      FAMILY HISTORY Family History  Problem  Relation Age of Onset   Heart disease Brother    COPD Brother    Cancer Brother        liver   Liver cancer Brother    Heart disease Mother        MI   COPD Mother    Pneumonia Mother    Diabetes Mother    COPD Father    Colon polyps Other    Esophageal cancer Neg Hx    Rectal cancer Neg Hx    Stomach cancer Neg Hx     SOCIAL HISTORY Social History   Tobacco Use   Smoking status: Never   Smokeless tobacco: Never  Vaping Use   Vaping Use: Never used  Substance Use Topics   Alcohol use: No   Drug use: No         OPHTHALMIC EXAM:  Base Eye Exam     Visual Acuity (ETDRS)       Right Left   Dist cc 20/20 20/30 -1   Dist ph cc  20/25         Tonometry (Tonopen, 1:48 PM)       Right Left   Pressure 11 11         Pupils       Pupils Dark Light Shape React  APD   Right PERRL 3 2 Round Sluggish None   Left PERRL 3 2 Round Sluggish None         Visual Fields (Counting fingers)       Left Right    Full Full         Extraocular Movement       Right Left    Full Full         Neuro/Psych     Oriented x3: Yes   Mood/Affect: Normal         Dilation     Both eyes: 1.0% Mydriacyl, 2.5% Phenylephrine @ 1:48 PM           Slit Lamp and Fundus Exam     External Exam       Right Left   External Normal Normal         Slit Lamp Exam       Right Left   Lids/Lashes Normal Normal   Conjunctiva/Sclera White and quiet White and quiet   Cornea Clear Clear   Anterior Chamber Deep and quiet Deep and quiet   Iris Round and reactive Round and reactive   Lens Posterior chamber intraocular lens Posterior chamber intraocular lens   Anterior Vitreous Normal Normal         Fundus Exam       Right Left   Posterior Vitreous Normal Normal   Disc Normal Normal   C/D Ratio 0.25 0.3   Macula Normal, none foveal epiretinal membrane mild Epiretinal membrane, severe topographic distortion with pseudo CME   Vessels Normal Normal   Periphery Normal Normal            IMAGING AND PROCEDURES  Imaging and Procedures for 11/08/20  OCT, Retina - OU - Both Eyes       Right Eye Scan locations included subfoveal. Central Foveal Thickness: 281. Progression has been stable. Findings include epiretinal membrane.   Left Eye Quality was good. Scan locations included subfoveal. Central Foveal Thickness: 456. Progression has been stable. Findings include epiretinal membrane.   Notes Minor epiretinal membrane with no topographic distortion OD, temporal to fovea.  OS with overall stable appearance  and retinal thickening of the left eye from epiretinal membrane, what is new and now slightly progressed however is a small outer retinal nodule of subretinal debris, this could be a change in the photoreceptor layer.  With increased  thickening, will need to observe closely             ASSESSMENT/PLAN:  Left epiretinal membrane Outer photoreceptor layer changes increasing as compared to January 2022.  In addition macular thickening has increased from the epiretinal membrane.  There is now also foveal macular retinoschisis which can have lingering and permanent visual deficits if left on treated with removal of the distorting epiretinal membrane.  These findings were reviewed in sequence over the last 2 years with the patient so that the deeper understanding about the need for close follow-up is understood.  I will asked the patient at home to monitor on a monocular basis visual acuity with near vision comparing the left versus the right eye so that he can begin to appreciate visual differences  Right epiretinal membrane Minor perifoveal ERM/ILM striae superior to the foveal avascular zone right eye, no visual acuity impact, no progression or change over the past 2 years.  Observe only  Early stage nonexudative age-related macular degeneration of both eyes Minor rare drusen by OCT  Macular retinoschisis, left Foveal macular schisis developing in the p.m. bundle region of the nasal macula.  Explained to the patient that the epiretinal membrane is triggering this and that release of the traction and would halt further thickening but more importantly stabilize acuity and provide for transient visual acuity improvement.  Also explained that persistent and worsening foveal macular schisis can deliver and cause and trigger permanent vision loss that cannot be recovered even with vitrectomy membrane peel but that can be halted and its progression     ICD-10-CM   1. Left epiretinal membrane  H35.372     2. Right epiretinal membrane  H35.371     3. Early stage nonexudative age-related macular degeneration of both eyes  H35.3131 OCT, Retina - OU - Both Eyes    4. Macular retinoschisis, left  H33.102       1.  Epiretinal  membrane and the findings of the OCT were reviewed in sequence over the last 2 years with the patient.  I encouraged patient to continue to monitor at home with visual acuity at near with glasses on a monocular that is 1 eye basis so he can determine whether there is any significant differences between the 2 eyes but more importantly to determine if the left eye is deteriorating today as compared to recent weeks  2.  3.  Ophthalmic Meds Ordered this visit:  No orders of the defined types were placed in this encounter.      Return in about 3 months (around 02/08/2021) for DILATE OU, OCT.  There are no Patient Instructions on file for this visit.   Explained the diagnoses, plan, and follow up with the patient and they expressed understanding.  Patient expressed understanding of the importance of proper follow up care.   Clent Demark Azrael Maddix M.D. Diseases & Surgery of the Retina and Vitreous [facility] [place] 11/08/20     Abbreviations: M myopia (nearsighted); A astigmatism; H hyperopia (farsighted); P presbyopia; Mrx spectacle prescription;  CTL contact lenses; OD right eye; OS left eye; OU both eyes  XT exotropia; ET esotropia; PEK punctate epithelial keratitis; PEE punctate epithelial erosions; DES dry eye syndrome; MGD meibomian gland dysfunction; ATs artificial  tears; PFAT's preservative free artificial tears; [place] nuclear sclerotic cataract; PSC posterior subcapsular cataract; ERM epi-retinal membrane; PVD posterior vitreous detachment; RD retinal detachment; DM diabetes mellitus; DR diabetic retinopathy; NPDR non-proliferative diabetic retinopathy; PDR proliferative diabetic retinopathy; CSME clinically significant macular edema; DME diabetic macular edema; dbh dot blot hemorrhages; CWS cotton wool spot; POAG primary open angle glaucoma; C/D cup-to-disc ratio; HVF humphrey visual field; GVF goldmann visual field; OCT optical coherence tomography; IOP intraocular pressure; BRVO  Branch retinal vein occlusion; CRVO central retinal vein occlusion; CRAO central retinal artery occlusion; BRAO branch retinal artery occlusion; RT retinal tear; SB scleral buckle; PPV pars plana vitrectomy; VH Vitreous hemorrhage; PRP panretinal laser photocoagulation; IVK intravitreal kenalog; VMT vitreomacular traction; MH Macular hole;  NVD neovascularization of the disc; NVE neovascularization elsewhere; AREDS age related eye disease study; ARMD age related macular degeneration; POAG primary open angle glaucoma; EBMD epithelial/anterior basement membrane dystrophy; ACIOL anterior chamber intraocular lens; IOL intraocular lens; PCIOL posterior chamber intraocular lens; Phaco/IOL phacoemulsification with intraocular lens placement; [place] photorefractive keratectomy; LASIK laser assisted in situ keratomileusis; HTN hypertension; DM diabetes mellitus; COPD chronic obstructive pulmonary disease

## 2020-11-08 NOTE — Assessment & Plan Note (Signed)
Minor perifoveal ERM/ILM striae superior to the foveal avascular zone right eye, no visual acuity impact, no progression or change over the past 2 years.  Observe only

## 2020-11-08 NOTE — Assessment & Plan Note (Signed)
Foveal macular schisis developing in the p.m. bundle region of the nasal macula.  Explained to the patient that the epiretinal membrane is triggering this and that release of the traction and would halt further thickening but more importantly stabilize acuity and provide for transient visual acuity improvement.  Also explained that persistent and worsening foveal macular schisis can deliver and cause and trigger permanent vision loss that cannot be recovered even with vitrectomy membrane peel but that can be halted and its progression

## 2020-11-08 NOTE — Assessment & Plan Note (Signed)
Minor rare drusen by OCT

## 2020-11-08 NOTE — Assessment & Plan Note (Signed)
Outer photoreceptor layer changes increasing as compared to January 2022.  In addition macular thickening has increased from the epiretinal membrane.  There is now also foveal macular retinoschisis which can have lingering and permanent visual deficits if left on treated with removal of the distorting epiretinal membrane.  These findings were reviewed in sequence over the last 2 years with the patient so that the deeper understanding about the need for close follow-up is understood.  I will asked the patient at home to monitor on a monocular basis visual acuity with near vision comparing the left versus the right eye so that he can begin to appreciate visual differences

## 2020-11-09 DIAGNOSIS — C61 Malignant neoplasm of prostate: Secondary | ICD-10-CM | POA: Diagnosis not present

## 2020-11-16 DIAGNOSIS — N3942 Incontinence without sensory awareness: Secondary | ICD-10-CM | POA: Diagnosis not present

## 2020-11-16 DIAGNOSIS — C61 Malignant neoplasm of prostate: Secondary | ICD-10-CM | POA: Diagnosis not present

## 2020-11-16 DIAGNOSIS — C770 Secondary and unspecified malignant neoplasm of lymph nodes of head, face and neck: Secondary | ICD-10-CM | POA: Diagnosis not present

## 2020-11-22 DIAGNOSIS — C770 Secondary and unspecified malignant neoplasm of lymph nodes of head, face and neck: Secondary | ICD-10-CM | POA: Diagnosis not present

## 2020-11-22 DIAGNOSIS — R9721 Rising PSA following treatment for malignant neoplasm of prostate: Secondary | ICD-10-CM | POA: Insufficient documentation

## 2020-11-23 ENCOUNTER — Other Ambulatory Visit: Payer: Self-pay | Admitting: Gastroenterology

## 2020-11-27 ENCOUNTER — Other Ambulatory Visit: Payer: Self-pay | Admitting: Gastroenterology

## 2020-12-04 ENCOUNTER — Ambulatory Visit: Payer: PPO | Admitting: Oncology

## 2020-12-04 ENCOUNTER — Telehealth: Payer: Self-pay | Admitting: *Deleted

## 2020-12-04 NOTE — H&P (Signed)
Nicholas Daniels is a 70 y.o. male who presents as a consult Patient.   Referring Provider: Wyatt Portela, MD  Chief complaint: Neck mass.  HPI: History of prostate cancer initially treated surgically and with radioactive seeds about 10 years ago. He has had recent elevation of PSA with unknown source. Most recent PET scan in May revealed enlarged lymph nodes left level 2 with minimal uptake. He had FNA of this recently which reveals carcinoma of unknown type. Genitourinary is 1 possibility. He does not smoke. He denies any throat symptoms.  PMH/Meds/All/SocHx/FamHx/ROS:   Past Medical History:  Diagnosis Date   Cancer ([place])   Hypertension   Macular degeneration   Past Surgical History:  Procedure Laterality Date   CATARACT EXTRACTION   CHOLECYSTECTOMY   COLONOSCOPY   PROSTATECTOMY   No family history of bleeding disorders, wound healing problems or difficulty with anesthesia.   Social History   Socioeconomic History   Marital status: Married  Spouse name: Not on file   Number of children: Not on file   Years of education: Not on file   Highest education level: Not on file  Occupational History   Not on file  Tobacco Use   Smoking status: Never Smoker   Smokeless tobacco: Never Used  Vaping Use   Vaping Use: Never used  Substance and Sexual Activity   Alcohol use: Not on file   Drug use: Not on file   Sexual activity: Not on file  Other Topics Concern   Not on file  Social History Narrative   Not on file   Social Determinants of Health   Financial Resource Strain: Not on file  Food Insecurity: Not on file  Transportation Needs: Not on file  Physical Activity: Not on file  Stress: Not on file  Social Connections: Not on file  Housing Stability: Not on file   Current Outpatient Medications:   aspirin 325 MG tablet *ANTIPLATELET*, Take 325 mg by mouth daily., Disp: , Rfl:   calcium-vitamin D 500 mg(1,'250mg'$ ) -200 unit per tablet, Take by mouth., Disp: , Rfl:    cholecalciferol, vitamin D3, 50 mcg (2,000 unit) Cap, Take by mouth., Disp: , Rfl:   dilTIAZem (CARTIA XT, CARDIZEM CD) 120 MG 24 hr capsule, Take 120 mg by mouth daily., Disp: , Rfl:   latanoprost (XALATAN) 0.005 % ophthalmic solution, Apply 1 drop to eye nightly., Disp: , Rfl:   multivitamin (TAB-A-VITE) tablet, Take 1 tablet by mouth daily., Disp: , Rfl:   niacin 250 mg Tb Ext Rel, Take 250 mg by mouth., Disp: , Rfl:   omeprazole (PRILOSEC) 40 MG capsule, Take 40 mg by mouth daily., Disp: , Rfl:   sucralfate (CARAFATE) 1 gram tablet, 1 tablet on an empty stomach, Disp: , Rfl:   timolol (TIMOPTIC) 0.5 % ophthalmic solution, INSTILL 1 DROP INTO BOTH EYES IN THE MORNING, Disp: , Rfl:   A complete ROS was performed with pertinent positives/negatives noted in the HPI. The remainder of the ROS are negative.   Physical Exam:   BP 129/68  Pulse 61  Temp 97.1 F (36.2 C)  Ht 1.702 m ('5\' 7"'$ )  Wt 91.6 kg (202 lb)  BMI 31.64 kg/m   General: Healthy and alert, in no distress, breathing easily. Normal affect. In a pleasant mood. Head: Normocephalic, atraumatic. No masses, or scars. Eyes: Pupils are equal, and reactive to light. Vision is grossly intact. No spontaneous or gaze nystagmus. Ears: Ear canals are clear. Tympanic membranes are intact, with normal  landmarks and the middle ears are clear and healthy. Hearing: Grossly normal. Nose: Nasal cavities are clear with healthy mucosa, no polyps or exudate. Airways are patent. Face: No masses or scars, facial nerve function is symmetric. Oral Cavity: No mucosal abnormalities are noted. Tongue with normal mobility. Dentition appears healthy. Oropharynx: Tonsils are symmetric. There are no mucosal masses identified. Tongue base appears normal and healthy. Larynx/Hypopharynx: indirect exam reveals healthy, mobile vocal cords, without mucosal lesions in the hypopharynx or larynx. Chest: Deferred Neck: Fullness left level 2 area with nondistinct  nodes palpable, no thyroid nodules or enlargement. Neuro: Cranial nerves II-XII with normal function. Balance: Normal gate. Other findings: none.  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 Recommend dedicated CT for anatomic imaging. I will discuss with his oncologist what the next step should be but it may involve a neck dissection to further classify this carcinoma and possibly to treat it.

## 2020-12-04 NOTE — Telephone Encounter (Signed)
Dr. Alen Blew asked that patient's appt for 8/30 be rescheduled for 2 weeks post op. Scheduled currently on 12/17/20 for Laryngoscopy and Esophagoscopy w/biopsy with left neck dissection.  Contacted patient with this information and advised him scheduler would contact him to r/s appt 2 weeks after surgery - last week in September or first week in October depending on appt availability.  Patient and wife verbalized understanding. Schedule message sent

## 2020-12-05 ENCOUNTER — Telehealth: Payer: Self-pay | Admitting: Oncology

## 2020-12-05 NOTE — Telephone Encounter (Signed)
Scheduled appt per 8/29 sch msg. Called and psoke with patients wife. Confirmed appt

## 2020-12-11 ENCOUNTER — Other Ambulatory Visit: Payer: Self-pay | Admitting: Otolaryngology

## 2020-12-11 DIAGNOSIS — C770 Secondary and unspecified malignant neoplasm of lymph nodes of head, face and neck: Secondary | ICD-10-CM

## 2020-12-11 DIAGNOSIS — R9721 Rising PSA following treatment for malignant neoplasm of prostate: Secondary | ICD-10-CM

## 2020-12-12 NOTE — Pre-Procedure Instructions (Signed)
Nicholas Daniels  12/12/2020     Your procedure is scheduled on Mon., Sept. 12, 2022 from 7:30AM-10:30AM  Report to [facility] Entrance "A" at 5:30AM  Call this number if you have problems the morning of surgery:  [phone]   Remember:  Do not eat or drink after midnight on Sept. 11th    Take these medicines the morning of surgery with A SIP OF WATER: Omeprazole (PRILOSEC)  Timolol (TIMOPTIC)  Follow your surgeon's instructions on when to stop Aspirin.  If no instructions were given by your surgeon then you will need to call the office to get those instructions.    As of today, STOP taking all Aspirin (unless instructed by your doctor) and Other Aspirin containing products, Vitamins, Fish oils, and Herbal medications. Also stop all NSAIDS i.e. Advil, Ibuprofen, Motrin, Aleve, Anaprox, Naproxen, BC, Goody Powders, and all Supplements.    No Smoking of any kind, Tobacco/Vaping, or Alcohol products 24 hours prior to your procedure. If you use a Cpap at night, you may bring all equipment for your overnight stay.    Day of Surgery:  Do not wear jewelry, make-up. Do Not wear nail polish, gel polish, artificial nails, or any other type of covering on  natural nails including finger and toenails. If patients have artificial nails, gel coating, etc. that need to be removed by a nail salon please have this removed prior to surgery or surgery may need to be canceled/delayed if the surgeon/ anesthesia feels like the patient is unable to be adequately monitored.  Do not wear lotions, powders, or perfumes/colognes, or deodorant.  Do not shave 48 hours prior to surgery.  Men may shave face and neck.  Do not bring valuables to the hospital.  [facility] is not responsible for any belongings or valuables.  Contacts, dentures or bridgework may not be worn into surgery.    For patients admitted to the hospital, discharge time will be determined by your treatment team.  Patients  discharged the day of surgery will not be allowed to drive home, and someone age 71 and over needs to stay with them for 24 hours.  Oral Hygiene is also important to reduce your risk of infection.  Remember - BRUSH YOUR TEETH THE MORNING OF SURGERY WITH YOUR REGULAR TOOTHPASTE  Special instructions:  [facility]- Preparing For Surgery  Before surgery, you can play an important role. Because skin is not sterile, your skin needs to be as free of germs as possible. You can reduce the number of germs on your skin by washing with CHG (chlorahexidine gluconate) Soap before surgery.  CHG is an antiseptic cleaner which kills germs and bonds with the skin to continue killing germs even after washing.    Please do not use if you have an allergy to CHG or antibacterial soaps. If your skin becomes reddened/irritated stop using the CHG.  Do not shave (including legs and underarms) for at least 48 hours prior to first CHG shower. It is OK to shave your face.  Please follow these instructions carefully.   Shower the NIGHT BEFORE SURGERY and the MORNING OF SURGERY with CHG.   If you chose to wash your hair, wash your hair first as usual with your normal shampoo.  After you shampoo, rinse your hair and body thoroughly to remove the shampoo.  Use CHG as you would any other liquid soap. You can apply CHG directly to the skin and wash gently with a scrungie or  a clean washcloth.   Apply the CHG Soap to your body ONLY FROM THE NECK DOWN.  Do not use on open wounds or open sores. Avoid contact with your eyes, ears, mouth and genitals (private parts). Wash Face and genitals (private parts)  with your normal soap.  Wash thoroughly, paying special attention to the area where your surgery will be performed.  Thoroughly rinse your body with warm water from the neck down.  DO NOT shower/wash with your normal soap after using and rinsing off the CHG Soap.  Pat yourself dry with a CLEAN TOWEL.  Wear CLEAN PAJAMAS  to bed the night before surgery, wear comfortable clothes the morning of surgery  Place CLEAN SHEETS on your bed the night of your first shower and DO NOT SLEEP WITH PETS.  Reminders: Do not apply any deodorants/lotions.  Please wear clean clothes to the hospital/surgery center.   Remember to brush your teeth WITH YOUR REGULAR TOOTHPASTE.  Please read over the following fact sheets that you were given.

## 2020-12-13 ENCOUNTER — Ambulatory Visit (INDEPENDENT_AMBULATORY_CARE_PROVIDER_SITE_OTHER): Payer: PPO | Admitting: Ophthalmology

## 2020-12-13 ENCOUNTER — Encounter (HOSPITAL_COMMUNITY): Payer: Self-pay

## 2020-12-13 ENCOUNTER — Encounter (HOSPITAL_COMMUNITY)
Admission: RE | Admit: 2020-12-13 | Discharge: 2020-12-13 | Disposition: A | Discharge status: Home / Self Care | Payer: PPO | Source: Ambulatory Visit | Attending: Otolaryngology | Admitting: Otolaryngology

## 2020-12-13 ENCOUNTER — Encounter (INDEPENDENT_AMBULATORY_CARE_PROVIDER_SITE_OTHER): Payer: PPO | Admitting: Ophthalmology

## 2020-12-13 ENCOUNTER — Encounter (INDEPENDENT_AMBULATORY_CARE_PROVIDER_SITE_OTHER): Payer: Self-pay | Admitting: Ophthalmology

## 2020-12-13 ENCOUNTER — Ambulatory Visit
Admission: RE | Admit: 2020-12-13 | Discharge: 2020-12-13 | Disposition: A | Discharge status: Home / Self Care | Payer: PPO | Source: Ambulatory Visit | Attending: Otolaryngology | Admitting: Otolaryngology

## 2020-12-13 ENCOUNTER — Other Ambulatory Visit: Payer: Self-pay

## 2020-12-13 DIAGNOSIS — H35372 Puckering of macula, left eye: Secondary | ICD-10-CM

## 2020-12-13 DIAGNOSIS — Z01818 Encounter for other preprocedural examination: Secondary | ICD-10-CM | POA: Insufficient documentation

## 2020-12-13 DIAGNOSIS — Z20822 Contact with and (suspected) exposure to covid-19: Secondary | ICD-10-CM | POA: Diagnosis not present

## 2020-12-13 DIAGNOSIS — R590 Localized enlarged lymph nodes: Secondary | ICD-10-CM | POA: Diagnosis not present

## 2020-12-13 DIAGNOSIS — H04123 Dry eye syndrome of bilateral lacrimal glands: Secondary | ICD-10-CM | POA: Diagnosis not present

## 2020-12-13 DIAGNOSIS — C770 Secondary and unspecified malignant neoplasm of lymph nodes of head, face and neck: Secondary | ICD-10-CM

## 2020-12-13 DIAGNOSIS — H16142 Punctate keratitis, left eye: Secondary | ICD-10-CM | POA: Diagnosis not present

## 2020-12-13 DIAGNOSIS — R9721 Rising PSA following treatment for malignant neoplasm of prostate: Secondary | ICD-10-CM

## 2020-12-13 LAB — CBC
HCT: 45 % (ref 39.0–52.0)
Hemoglobin: 15.1 g/dL (ref 13.0–17.0)
MCH: 30.2 pg (ref 26.0–34.0)
MCHC: 33.6 g/dL (ref 30.0–36.0)
MCV: 90 fL (ref 80.0–100.0)
Platelets: 302 10*3/uL (ref 150–400)
RBC: 5 MIL/uL (ref 4.22–5.81)
RDW: 12.9 % (ref 11.5–15.5)
WBC: 5.6 10*3/uL (ref 4.0–10.5)
nRBC: 0 % (ref 0.0–0.2)

## 2020-12-13 LAB — BASIC METABOLIC PANEL
Anion gap: 7 (ref 5–15)
BUN: 10 mg/dL (ref 8–23)
CO2: 25 mmol/L (ref 22–32)
Calcium: 8.9 mg/dL (ref 8.9–10.3)
Chloride: 107 mmol/L (ref 98–111)
Creatinine, Ser: 1.1 mg/dL (ref 0.61–1.24)
GFR, Estimated: >60 mL/min (ref >60)
Glucose, Bld: 76 mg/dL (ref 70–99)
Potassium: 4 mmol/L (ref 3.5–5.1)
Sodium: 139 mmol/L (ref 135–145)

## 2020-12-13 LAB — SARS CORONAVIRUS 2 (TAT 6-24 HRS): SARS Coronavirus 2: NEGATIVE

## 2020-12-13 MED ORDER — IOPAMIDOL (ISOVUE-300) INJECTION 61%
75.0000 mL | Freq: Once | INTRAVENOUS | Status: AC | PRN
  Administered 2020-12-13: 75 mL via INTRAVENOUS

## 2020-12-13 NOTE — Assessment & Plan Note (Signed)
Minor superficial punctate keratitis across ocular surface no vertical striations thus no foreign body signs

## 2020-12-13 NOTE — Assessment & Plan Note (Signed)
I recommend patient resume use of preservative-free teardrops for surface irritation left eye  He may in fact have a mild viral keratitis, not herpetiform, and thus I do want him to follow-up with [facility]

## 2020-12-13 NOTE — Progress Notes (Signed)
12/13/2020     CHIEF COMPLAINT Patient presents for  Chief Complaint  Patient presents with   Eye Problem      HISTORY OF PRESENT ILLNESS: Nicholas Daniels is a 70 y.o. male who presents to the clinic today for:   HPI   New Eye Problem Visit  Pt c/o FBS in the left eye, started 2-3 days ago with the worst 1 day ago, intermittent, resolved as of today. Pt states he does suffer from dry eyes. Pt denies any visual changes, stable. Pt denies any changes on his at-home amsler grid testing. Pt denies any new flashes or floaters. Pt denies any eye pain.   Pt reports new abnormal mass on his left side lymph node in the neck detected on a CT recently, Wife reports this was found to be malignant on biopsy, scheduled for removal.  Eye Meds: Timolol QAM OU Latanoprost QHS OU Last edited by Reather Littler, COA on 12/13/2020 10:45 AM.      Referring physician: Warden Fillers, MD [address] [place],  [place] [address]  HISTORICAL INFORMATION:   Selected notes from the MEDICAL RECORD NUMBER       CURRENT MEDICATIONS: Current Outpatient Medications (Ophthalmic Drugs)  Medication Sig   latanoprost (XALATAN) 0.005 % ophthalmic solution Place 1 drop into both eyes at bedtime.   timolol (TIMOPTIC) 0.5 % ophthalmic solution Place 1 drop into both eyes daily.   No current facility-administered medications for this visit. (Ophthalmic Drugs)   Current Outpatient Medications (Other)  Medication Sig   aspirin 81 MG EC tablet Take 162 mg by mouth at bedtime.   calcium-vitamin D (CALCIUM 500/D) 500-200 MG-UNIT tablet Take 1 tablet by mouth at bedtime.   diltiazem (CARDIZEM CD) 120 MG 24 hr capsule Take 240 mg by mouth at bedtime.   Flaxseed, Linseed, (FLAXSEED OIL) 1200 MG CAPS Take 2,400 mg by mouth at bedtime.   Multiple Vitamin (MULTIVITAMIN) tablet Take 1 tablet by mouth at bedtime.   niacin 500 MG CR capsule Take 500 mg by mouth at bedtime.   omeprazole (PRILOSEC) 40 MG  capsule TAKE 1 CAPSULE BY MOUTH EVERY DAY   sucralfate (CARAFATE) 1 g tablet TAKE ONE TABLET BY MOUTH TWICE DAILY (Patient taking differently: Take 1 g by mouth at bedtime.)   Vitamin D, Cholecalciferol, 50 MCG (2000 UT) CAPS Take 2,000 Units by mouth at bedtime.   No current facility-administered medications for this visit. (Other)      REVIEW OF SYSTEMS:    ALLERGIES No Known Allergies  PAST MEDICAL HISTORY Past Medical History:  Diagnosis Date   Cataract    removed bilateraly   Cough    Glaucoma    Hypertension    PFO (patent foramen ovale)    PFO noted on coronary CT 09/05/14   Prostate cancer ([facility]) 2009   Past Surgical History:  Procedure Laterality Date   CATARACT EXTRACTION     bilateral and a wrinkle on retina on the left    CHOLECYSTECTOMY     COLONOSCOPY     PROSTATE SURGERY  2009   radiation treatment     RESECTION OF MEDIASTINAL MASS N/A 02/26/2015   Procedure: RESECTION OF MEDIASTINAL MASS;  Surgeon: Ivin Poot, MD;  Location: [facility];  Service: Thoracic;  Laterality: N/A;   STERNOTOMY N/A 02/26/2015   Procedure: STERNOTOMY;  Surgeon: Ivin Poot, MD;  Location: [facility];  Service: Thoracic;  Laterality: N/A;   UPPER GASTROINTESTINAL ENDOSCOPY  FAMILY HISTORY Family History  Problem Relation Age of Onset   Heart disease Brother    COPD Brother    Cancer Brother        liver   Liver cancer Brother    Heart disease Mother        MI   COPD Mother    Pneumonia Mother    Diabetes Mother    COPD Father    Colon polyps Other    Esophageal cancer Neg Hx    Rectal cancer Neg Hx    Stomach cancer Neg Hx     SOCIAL HISTORY Social History   Tobacco Use   Smoking status: Never   Smokeless tobacco: Never  Vaping Use   Vaping Use: Never used  Substance Use Topics   Alcohol use: No   Drug use: No         OPHTHALMIC EXAM:  Base Eye Exam     Visual Acuity (ETDRS)       Right Left   Dist [place] 20/20 20/30 +2   Dist ph [place]  NI     Correction: Glasses         Tonometry (Tonopen, 10:51 AM)       Right Left   Pressure 6 6         Pupils       Pupils Dark Light Shape React APD   Right PERRL 3 2 Round Sluggish None   Left PERRL 3 2 Round Sluggish None         Visual Fields (Counting fingers)       Left Right    Full Full         Extraocular Movement       Right Left    Full, Ortho Full, Ortho         Neuro/Psych     Oriented x3: Yes   Mood/Affect: Normal         Dilation     NO DILATION            Slit Lamp and Fundus Exam     External Exam       Right Left   External Normal Normal         Slit Lamp Exam       Right Left   Lids/Lashes Normal Normal   Conjunctiva/Sclera White and quiet White and quiet, no signs of conjunctivitis   Cornea Clear Fine SPK noted on the ocular surface of the left eye.     Anterior Chamber Deep and quiet Deep and quiet   Iris Round and reactive Round and reactive   Lens Posterior chamber intraocular lens Posterior chamber intraocular lens   Anterior Vitreous Normal Normal            IMAGING AND PROCEDURES  Imaging and Procedures for 12/13/20  OCT, Retina - OU - Both Eyes       Right Eye Scan locations included subfoveal. Central Foveal Thickness: 281. Progression has been stable. Findings include epiretinal membrane.   Left Eye Quality was good. Scan locations included subfoveal. Central Foveal Thickness: 456. Progression has been stable. Findings include epiretinal membrane.   Notes Minor epiretinal membrane with no topographic distortion OD, temporal to fovea.  OS with overall stable appearance and retinal thickening of the left eye from epiretinal membrane, what is new and now slightly progressed however is a small outer retinal nodule of subretinal debris, this could be a change in the photoreceptor layer.  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  Reviewed with the patient with visual acuity impact recommend consideration of surgical correction of this disorder.  Dry eyes, bilateral I recommend patient resume use of preservative-free teardrops for surface irritation left eye  He may in fact have a mild viral keratitis, not herpetiform, and thus I do want him to follow-up with [facility]     ICD-10-CM   1. Left epiretinal membrane  H35.372 OCT, Retina - OU - Both Eyes    2. SPK (superficial punctate keratitis), left  H16.142     3. Dry eyes, bilateral  H04.123       1.  Patient instructed to use artificial tears preservative-free for the left eye.  2.  Have encouraged patient to seek event reevaluation [facility] as I will avoid the use of topical mild steroids in the patient with a known history of combined mechanism glaucoma  3.  I discussed at length the and reviewed the OCT findings of the left eye and the recommendation for consideration of vitrectomy membrane peel so as to stabilize vision but most importantly provide for acuity improvement in the left eye from the progressive retinoschisis which is developed over the last 3 to 4 years  Ophthalmic Meds Ordered this visit:  No orders of the defined types were placed in this encounter.      Return in about 5 weeks (around 01/17/2021) for dilate, OS, OCT.  There are no Patient Instructions on file for this visit.   Explained the diagnoses, plan, and follow up with the patient and they expressed understanding.  Patient expressed understanding of the importance of proper follow up care.   Clent Demark Trinadee Verhagen M.D. Diseases & Surgery of the Retina and Vitreous [facility] 12/13/20     Abbreviations: M  myopia (nearsighted); A astigmatism; H hyperopia (farsighted); P presbyopia; Mrx spectacle prescription;  CTL contact lenses; OD right eye; OS left eye; OU both eyes  XT exotropia; ET esotropia; PEK punctate epithelial keratitis; PEE punctate epithelial erosions; DES dry eye syndrome; MGD meibomian gland dysfunction; ATs artificial tears; PFAT's preservative free artificial tears; [place] nuclear sclerotic cataract; PSC posterior subcapsular cataract; ERM epi-retinal membrane; PVD posterior vitreous detachment; RD retinal detachment; DM diabetes mellitus; DR diabetic retinopathy; NPDR non-proliferative diabetic retinopathy; PDR proliferative diabetic retinopathy; CSME clinically significant macular edema; DME diabetic macular edema; dbh dot blot hemorrhages; CWS cotton wool spot; POAG primary open angle glaucoma; C/D cup-to-disc ratio; HVF humphrey visual field; GVF goldmann visual field; OCT optical coherence tomography; IOP intraocular pressure; BRVO Branch retinal vein occlusion; CRVO central retinal vein occlusion; CRAO central retinal artery occlusion; BRAO branch retinal artery occlusion; RT retinal tear; SB scleral buckle; PPV pars plana vitrectomy; VH Vitreous hemorrhage; PRP panretinal laser photocoagulation; IVK intravitreal kenalog; VMT vitreomacular traction; MH Macular hole;  NVD neovascularization of the disc; NVE neovascularization elsewhere; AREDS age related eye disease study; ARMD age related macular degeneration; POAG primary open angle glaucoma; EBMD epithelial/anterior basement membrane dystrophy; ACIOL anterior chamber intraocular lens; IOL intraocular lens; PCIOL posterior chamber intraocular lens; Phaco/IOL phacoemulsification with intraocular lens placement; [place] photorefractive keratectomy; LASIK laser assisted in situ keratomileusis; HTN hypertension; DM diabetes mellitus; COPD  chronic obstructive pulmonary disease  

## 2020-12-13 NOTE — Assessment & Plan Note (Signed)
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  Reviewed with the patient with visual acuity impact recommend consideration of surgical correction of this disorder.

## 2020-12-13 NOTE — Progress Notes (Signed)
PCP - Leanna Battles MD Cardiologist - none  PPM/ICD - denies Device Orders -  Rep Notified -   Chest x-ray - n/a EKG - 12/13/20 Stress Test - none ECHO - none Cardiac Cath - none  Sleep Study - none CPAP -   Fasting Blood Sugar - n/a Checks Blood Sugar _____ times a day  Blood Thinner Instructions:n/a Aspirin Instructions:instructed to call Dr. Janeice Robinson office for directions on stopping ASA  ERAS Protcol -no PRE-SURGERY Ensure or G2-   COVID TEST- 12/13/20   Anesthesia review: no  Patient denies shortness of breath, fever, cough and chest pain at PAT appointment   All instructions explained to the patient, with a verbal understanding of the material. Patient agrees to go over the instructions while at home for a better understanding. Patient also instructed to self quarantine after being tested for COVID-19. The opportunity to ask questions was provided.

## 2020-12-17 ENCOUNTER — Inpatient Hospital Stay (HOSPITAL_COMMUNITY)
Admission: RE | Admit: 2020-12-17 | Discharge: 2020-12-18 | DRG: 822 | Disposition: A | Discharge status: Home / Self Care | Payer: PPO | Attending: Otolaryngology | Admitting: Otolaryngology

## 2020-12-17 ENCOUNTER — Ambulatory Visit (HOSPITAL_COMMUNITY): Payer: PPO

## 2020-12-17 ENCOUNTER — Encounter (HOSPITAL_COMMUNITY)
Admission: RE | Disposition: A | Discharge status: Home / Self Care | Payer: Self-pay | Source: Home / Self Care | Attending: Otolaryngology

## 2020-12-17 ENCOUNTER — Other Ambulatory Visit: Payer: Self-pay

## 2020-12-17 ENCOUNTER — Encounter (HOSPITAL_COMMUNITY): Payer: Self-pay | Admitting: Otolaryngology

## 2020-12-17 DIAGNOSIS — I10 Essential (primary) hypertension: Secondary | ICD-10-CM | POA: Diagnosis not present

## 2020-12-17 DIAGNOSIS — M6740 Ganglion, unspecified site: Secondary | ICD-10-CM | POA: Diagnosis present

## 2020-12-17 DIAGNOSIS — Z8546 Personal history of malignant neoplasm of prostate: Secondary | ICD-10-CM

## 2020-12-17 DIAGNOSIS — H3530 Unspecified macular degeneration: Secondary | ICD-10-CM | POA: Diagnosis present

## 2020-12-17 DIAGNOSIS — Z7982 Long term (current) use of aspirin: Secondary | ICD-10-CM

## 2020-12-17 DIAGNOSIS — C770 Secondary and unspecified malignant neoplasm of lymph nodes of head, face and neck: Secondary | ICD-10-CM | POA: Diagnosis not present

## 2020-12-17 DIAGNOSIS — Z79899 Other long term (current) drug therapy: Secondary | ICD-10-CM

## 2020-12-17 DIAGNOSIS — C801 Malignant (primary) neoplasm, unspecified: Secondary | ICD-10-CM | POA: Diagnosis not present

## 2020-12-17 DIAGNOSIS — R531 Weakness: Secondary | ICD-10-CM | POA: Diagnosis not present

## 2020-12-17 DIAGNOSIS — Z9049 Acquired absence of other specified parts of digestive tract: Secondary | ICD-10-CM | POA: Diagnosis not present

## 2020-12-17 DIAGNOSIS — H409 Unspecified glaucoma: Secondary | ICD-10-CM | POA: Diagnosis not present

## 2020-12-17 HISTORY — PX: ESOPHAGOSCOPY: SHX5534

## 2020-12-17 HISTORY — PX: DIRECT LARYNGOSCOPY: SHX5326

## 2020-12-17 HISTORY — PX: RADICAL NECK DISSECTION: SHX2284

## 2020-12-17 SURGERY — LARYNGOSCOPY, DIRECT
Anesthesia: General

## 2020-12-17 MED ORDER — BACITRACIN ZINC 500 UNIT/GM EX OINT
TOPICAL_OINTMENT | CUTANEOUS | Status: DC | PRN
  Administered 2020-12-17: 1 via TOPICAL

## 2020-12-17 MED ORDER — FENTANYL CITRATE (PF) 100 MCG/2ML IJ SOLN
25.0000 ug | INTRAMUSCULAR | Status: DC | PRN
  Administered 2020-12-17: 25 ug via INTRAVENOUS

## 2020-12-17 MED ORDER — DEXAMETHASONE SODIUM PHOSPHATE 10 MG/ML IJ SOLN
INTRAMUSCULAR | Status: DC | PRN
  Administered 2020-12-17: 5 mg via INTRAVENOUS

## 2020-12-17 MED ORDER — OXYCODONE HCL 5 MG/5ML PO SOLN: 5.0000 mg | Freq: Once | ORAL | Status: DC | PRN

## 2020-12-17 MED ORDER — FENTANYL CITRATE (PF) 100 MCG/2ML IJ SOLN
INTRAMUSCULAR | Status: AC
  Filled 2020-12-17: qty 2

## 2020-12-17 MED ORDER — PROPOFOL 10 MG/ML IV BOLUS
INTRAVENOUS | Status: AC
  Filled 2020-12-17: qty 20

## 2020-12-17 MED ORDER — ONDANSETRON HCL 4 MG/2ML IJ SOLN: 4.0000 mg | INTRAMUSCULAR | Status: DC | PRN

## 2020-12-17 MED ORDER — PHENYLEPHRINE 40 MCG/ML (10ML) SYRINGE FOR IV PUSH (FOR BLOOD PRESSURE SUPPORT)
PREFILLED_SYRINGE | INTRAVENOUS | Status: AC
  Filled 2020-12-17: qty 10

## 2020-12-17 MED ORDER — FENTANYL CITRATE (PF) 250 MCG/5ML IJ SOLN
INTRAMUSCULAR | Status: DC | PRN
  Administered 2020-12-17 (×5): 50 ug via INTRAVENOUS

## 2020-12-17 MED ORDER — ADULT MULTIVITAMIN W/MINERALS CH
1.0000 | ORAL_TABLET | Freq: Every day | ORAL | Status: DC
  Administered 2020-12-17: 1 via ORAL
  Filled 2020-12-17: qty 1

## 2020-12-17 MED ORDER — FLAXSEED OIL 1200 MG PO CAPS: 2400.0000 mg | ORAL_CAPSULE | Freq: Every day | ORAL | Status: DC

## 2020-12-17 MED ORDER — LIDOCAINE 2% (20 MG/ML) 5 ML SYRINGE
INTRAMUSCULAR | Status: AC
  Filled 2020-12-17: qty 5

## 2020-12-17 MED ORDER — BACITRACIN ZINC 500 UNIT/GM EX OINT
1.0000 "application " | TOPICAL_OINTMENT | Freq: Three times a day (TID) | CUTANEOUS | Status: DC
  Administered 2020-12-17 (×2): 1 via TOPICAL
  Filled 2020-12-17 (×2): qty 28.35

## 2020-12-17 MED ORDER — DILTIAZEM HCL ER COATED BEADS 240 MG PO CP24
240.0000 mg | ORAL_CAPSULE | Freq: Every day | ORAL | Status: DC
  Administered 2020-12-17: 240 mg via ORAL
  Filled 2020-12-17: qty 1

## 2020-12-17 MED ORDER — ACETAMINOPHEN 500 MG PO TABS: 1000.0000 mg | ORAL_TABLET | Freq: Once | ORAL | Status: DC | PRN

## 2020-12-17 MED ORDER — EPHEDRINE SULFATE-NACL 50-0.9 MG/10ML-% IV SOSY
PREFILLED_SYRINGE | INTRAVENOUS | Status: DC | PRN
  Administered 2020-12-17: 5 mg via INTRAVENOUS

## 2020-12-17 MED ORDER — CHLORHEXIDINE GLUCONATE 0.12 % MT SOLN: 15.0000 mL | Freq: Once | OROMUCOSAL | Status: AC

## 2020-12-17 MED ORDER — 0.9 % SODIUM CHLORIDE (POUR BTL) OPTIME
TOPICAL | Status: DC | PRN
  Administered 2020-12-17: 1000 mL

## 2020-12-17 MED ORDER — ASPIRIN EC 81 MG PO TBEC
162.0000 mg | DELAYED_RELEASE_TABLET | Freq: Every day | ORAL | Status: DC
  Administered 2020-12-17: 162 mg via ORAL
  Filled 2020-12-17: qty 2

## 2020-12-17 MED ORDER — LATANOPROST 0.005 % OP SOLN
1.0000 [drp] | Freq: Every day | OPHTHALMIC | Status: DC
  Administered 2020-12-17: 1 [drp] via OPHTHALMIC
  Filled 2020-12-17: qty 2.5

## 2020-12-17 MED ORDER — DEXAMETHASONE SODIUM PHOSPHATE 10 MG/ML IJ SOLN
INTRAMUSCULAR | Status: AC
  Filled 2020-12-17: qty 1

## 2020-12-17 MED ORDER — PROPOFOL 10 MG/ML IV BOLUS
INTRAVENOUS | Status: DC | PRN
  Administered 2020-12-17: 140 mg via INTRAVENOUS
  Administered 2020-12-17: 60 mg via INTRAVENOUS

## 2020-12-17 MED ORDER — ROCURONIUM BROMIDE 10 MG/ML (PF) SYRINGE
PREFILLED_SYRINGE | INTRAVENOUS | Status: DC | PRN
  Administered 2020-12-17: 10 mg via INTRAVENOUS
  Administered 2020-12-17: 70 mg via INTRAVENOUS

## 2020-12-17 MED ORDER — ACETAMINOPHEN 10 MG/ML IV SOLN
1000.0000 mg | Freq: Once | INTRAVENOUS | Status: DC | PRN
  Administered 2020-12-17: 1000 mg via INTRAVENOUS

## 2020-12-17 MED ORDER — KETOROLAC TROMETHAMINE 30 MG/ML IJ SOLN
INTRAMUSCULAR | Status: AC
  Filled 2020-12-17: qty 1

## 2020-12-17 MED ORDER — CHLORHEXIDINE GLUCONATE 0.12 % MT SOLN
OROMUCOSAL | Status: AC
  Administered 2020-12-17: 15 mL via OROMUCOSAL
  Filled 2020-12-17: qty 15

## 2020-12-17 MED ORDER — VITAMIN D 25 MCG (1000 UNIT) PO TABS
2000.0000 [IU] | ORAL_TABLET | Freq: Every day | ORAL | Status: DC
  Administered 2020-12-17: 2000 [IU] via ORAL
  Filled 2020-12-17: qty 2

## 2020-12-17 MED ORDER — ACETAMINOPHEN 10 MG/ML IV SOLN
INTRAVENOUS | Status: AC
  Filled 2020-12-17: qty 100

## 2020-12-17 MED ORDER — CALCIUM CARBONATE-VITAMIN D 500-200 MG-UNIT PO TABS
1.0000 | ORAL_TABLET | Freq: Every day | ORAL | Status: DC
  Administered 2020-12-17: 1 via ORAL
  Filled 2020-12-17: qty 1

## 2020-12-17 MED ORDER — ONDANSETRON HCL 4 MG PO TABS: 4.0000 mg | ORAL_TABLET | ORAL | Status: DC | PRN

## 2020-12-17 MED ORDER — FENTANYL CITRATE (PF) 250 MCG/5ML IJ SOLN
INTRAMUSCULAR | Status: AC
  Filled 2020-12-17: qty 5

## 2020-12-17 MED ORDER — TIMOLOL MALEATE 0.5 % OP SOLN
1.0000 [drp] | Freq: Every day | OPHTHALMIC | Status: DC
  Administered 2020-12-17 – 2020-12-18 (×2): 1 [drp] via OPHTHALMIC
  Filled 2020-12-17: qty 5

## 2020-12-17 MED ORDER — PANTOPRAZOLE SODIUM 40 MG PO TBEC
40.0000 mg | DELAYED_RELEASE_TABLET | Freq: Every day | ORAL | Status: DC
  Administered 2020-12-17 – 2020-12-18 (×2): 40 mg via ORAL
  Filled 2020-12-17 (×2): qty 1

## 2020-12-17 MED ORDER — ACETAMINOPHEN 160 MG/5ML PO SOLN: 1000.0000 mg | Freq: Once | ORAL | Status: DC | PRN

## 2020-12-17 MED ORDER — MIDAZOLAM HCL 2 MG/2ML IJ SOLN
INTRAMUSCULAR | Status: AC
  Filled 2020-12-17: qty 2

## 2020-12-17 MED ORDER — OXYCODONE HCL 5 MG PO TABS: 5.0000 mg | ORAL_TABLET | Freq: Once | ORAL | Status: DC | PRN

## 2020-12-17 MED ORDER — ONDANSETRON HCL 4 MG/2ML IJ SOLN
INTRAMUSCULAR | Status: DC | PRN
  Administered 2020-12-17: 4 mg via INTRAVENOUS

## 2020-12-17 MED ORDER — HYDRALAZINE HCL 20 MG/ML IJ SOLN
10.0000 mg | Freq: Once | INTRAMUSCULAR | Status: AC
  Administered 2020-12-17: 10 mg via INTRAVENOUS

## 2020-12-17 MED ORDER — ONDANSETRON HCL 4 MG/2ML IJ SOLN
INTRAMUSCULAR | Status: AC
  Filled 2020-12-17: qty 2

## 2020-12-17 MED ORDER — MIDAZOLAM HCL 2 MG/2ML IJ SOLN
INTRAMUSCULAR | Status: DC | PRN
  Administered 2020-12-17: 2 mg via INTRAVENOUS

## 2020-12-17 MED ORDER — HYDROCODONE-ACETAMINOPHEN 5-325 MG PO TABS
1.0000 | ORAL_TABLET | ORAL | Status: DC | PRN
  Administered 2020-12-17 – 2020-12-18 (×2): 2 via ORAL
  Filled 2020-12-17 (×2): qty 2

## 2020-12-17 MED ORDER — PHENYLEPHRINE 40 MCG/ML (10ML) SYRINGE FOR IV PUSH (FOR BLOOD PRESSURE SUPPORT)
PREFILLED_SYRINGE | INTRAVENOUS | Status: DC | PRN
  Administered 2020-12-17: 80 ug via INTRAVENOUS
  Administered 2020-12-17: 120 ug via INTRAVENOUS
  Administered 2020-12-17: 80 ug via INTRAVENOUS
  Administered 2020-12-17: 40 ug via INTRAVENOUS
  Administered 2020-12-17: 80 ug via INTRAVENOUS

## 2020-12-17 MED ORDER — HYDRALAZINE HCL 20 MG/ML IJ SOLN
INTRAMUSCULAR | Status: AC
  Filled 2020-12-17: qty 1

## 2020-12-17 MED ORDER — ORAL CARE MOUTH RINSE: 15.0000 mL | Freq: Once | OROMUCOSAL | Status: AC

## 2020-12-17 MED ORDER — ROCURONIUM BROMIDE 10 MG/ML (PF) SYRINGE
PREFILLED_SYRINGE | INTRAVENOUS | Status: AC
  Filled 2020-12-17: qty 10

## 2020-12-17 MED ORDER — EPINEPHRINE HCL (NASAL) 0.1 % NA SOLN
NASAL | Status: AC
  Filled 2020-12-17: qty 30

## 2020-12-17 MED ORDER — IBUPROFEN 100 MG/5ML PO SUSP: 400.0000 mg | Freq: Four times a day (QID) | ORAL | Status: DC | PRN

## 2020-12-17 MED ORDER — SUGAMMADEX SODIUM 200 MG/2ML IV SOLN
INTRAVENOUS | Status: DC | PRN
  Administered 2020-12-17: 200 mg via INTRAVENOUS

## 2020-12-17 MED ORDER — NIACIN ER 500 MG PO CPCR
500.0000 mg | ORAL_CAPSULE | Freq: Every day | ORAL | Status: DC
  Administered 2020-12-17: 500 mg via ORAL
  Filled 2020-12-17 (×2): qty 1

## 2020-12-17 MED ORDER — LIDOCAINE 2% (20 MG/ML) 5 ML SYRINGE
INTRAMUSCULAR | Status: DC | PRN
  Administered 2020-12-17: 60 mg via INTRAVENOUS

## 2020-12-17 MED ORDER — BACITRACIN ZINC 500 UNIT/GM EX OINT
TOPICAL_OINTMENT | CUTANEOUS | Status: AC
  Filled 2020-12-17: qty 28.35

## 2020-12-17 MED ORDER — DEXTROSE-NACL 5-0.9 % IV SOLN: INTRAVENOUS | Status: DC

## 2020-12-17 MED ORDER — LACTATED RINGERS IV SOLN: INTRAVENOUS | Status: DC

## 2020-12-17 MED ORDER — SUCRALFATE 1 G PO TABS
1.0000 g | ORAL_TABLET | Freq: Every day | ORAL | Status: DC
  Administered 2020-12-17: 1 g via ORAL
  Filled 2020-12-17: qty 1

## 2020-12-17 SURGICAL SUPPLY — 66 items
APPLIER CLIP 9.375 MED OPEN (MISCELLANEOUS) ×3
APPLIER CLIP 9.375 SM OPEN (CLIP)
BAG COUNTER SPONGE SURGICOUNT (BAG) ×3 IMPLANT
BALLN PULM 15 16.5 18X75 (BALLOONS)
BALLOON PULM 15 16.5 18X75 (BALLOONS) IMPLANT
CANISTER SUCT 3000ML PPV (MISCELLANEOUS) ×3 IMPLANT
CLEANER TIP ELECTROSURG 2X2 (MISCELLANEOUS) ×3 IMPLANT
CLIP APPLIE 9.375 MED OPEN (MISCELLANEOUS) ×2 IMPLANT
CLIP APPLIE 9.375 SM OPEN (CLIP) IMPLANT
CNTNR URN SCR LID CUP LEK RST (MISCELLANEOUS) IMPLANT
CONT SPEC 4OZ STRL OR WHT (MISCELLANEOUS)
CORD BIPOLAR FORCEPS 12FT (ELECTRODE) ×9 IMPLANT
COVER BACK TABLE 60X90IN (DRAPES) ×3 IMPLANT
COVER MAYO STAND STRL (DRAPES) ×3 IMPLANT
COVER SURGICAL LIGHT HANDLE (MISCELLANEOUS) ×3 IMPLANT
DRAIN CHANNEL 15F RND FF W/TCR (WOUND CARE) IMPLANT
DRAIN SNY 10 ROU (WOUND CARE) IMPLANT
DRAIN WOUND SNY 15 RND (WOUND CARE) IMPLANT
DRAPE HALF SHEET 40X57 (DRAPES) ×3 IMPLANT
DRAPE INCISE 23X17 IOBAN STRL (DRAPES)
DRAPE INCISE IOBAN 23X17 STRL (DRAPES) IMPLANT
ELECT COATED BLADE 2.86 ST (ELECTRODE) ×3 IMPLANT
ELECT REM PT RETURN 9FT ADLT (ELECTROSURGICAL) ×3
ELECTRODE REM PT RTRN 9FT ADLT (ELECTROSURGICAL) ×2 IMPLANT
EVACUATOR SILICONE 100CC (DRAIN) ×3 IMPLANT
FORCEPS BIPOLAR SPETZLER 8 1.0 (NEUROSURGERY SUPPLIES) ×6 IMPLANT
GAUZE 4X4 16PLY ~~LOC~~+RFID DBL (SPONGE) ×6 IMPLANT
GAUZE SPONGE 4X4 12PLY STRL (GAUZE/BANDAGES/DRESSINGS) IMPLANT
GLOVE SURG ENC MOIS LTX SZ6.5 (GLOVE) ×3 IMPLANT
GLOVE SURG LTX SZ7.5 (GLOVE) ×3 IMPLANT
GOWN STRL REUS W/ TWL LRG LVL3 (GOWN DISPOSABLE) ×4 IMPLANT
GOWN STRL REUS W/TWL LRG LVL3 (GOWN DISPOSABLE) ×6
GUARD TEETH (MISCELLANEOUS) IMPLANT
KIT BASIN OR (CUSTOM PROCEDURE TRAY) ×3 IMPLANT
KIT TURNOVER KIT B (KITS) ×3 IMPLANT
LOCATOR NERVE 3 VOLT (DISPOSABLE) IMPLANT
NEEDLE PRECISIONGLIDE 27X1.5 (NEEDLE) ×3 IMPLANT
NS IRRIG 1000ML POUR BTL (IV SOLUTION) ×3 IMPLANT
PAD ARMBOARD 7.5X6 YLW CONV (MISCELLANEOUS) ×6 IMPLANT
PATTIES SURGICAL .5 X3 (DISPOSABLE) IMPLANT
PENCIL FOOT CONTROL (ELECTRODE) ×3 IMPLANT
SHEARS HARMONIC 9CM CVD (BLADE) ×3 IMPLANT
SOL ANTI FOG 6CC (MISCELLANEOUS) IMPLANT
SOLUTION ANTI FOG 6CC (MISCELLANEOUS)
SPECIMEN JAR MEDIUM (MISCELLANEOUS) IMPLANT
SPECIMEN JAR SMALL (MISCELLANEOUS) IMPLANT
SPONGE INTESTINAL PEANUT (DISPOSABLE) IMPLANT
SPONGE T-LAP 18X18 ~~LOC~~+RFID (SPONGE) IMPLANT
STAPLER VISISTAT 35W (STAPLE) ×3 IMPLANT
SUT CHROMIC 3 0 SH 27 (SUTURE) ×3 IMPLANT
SUT CHROMIC 5 0 P 3 (SUTURE) IMPLANT
SUT ETHILON 3 0 PS 1 (SUTURE) ×3 IMPLANT
SUT ETHILON 5 0 PS 2 18 (SUTURE) IMPLANT
SUT SILK 2 0 REEL (SUTURE) IMPLANT
SUT SILK 3 0 SH CR/8 (SUTURE) ×3 IMPLANT
SUT SILK 4 0 REEL (SUTURE) ×6 IMPLANT
SUT VIC AB 3-0 SH 18 (SUTURE) IMPLANT
SYR CONTROL 10ML LL (SYRINGE) IMPLANT
SYR TB 1ML LUER SLIP (SYRINGE) IMPLANT
TOWEL GREEN STERILE (TOWEL DISPOSABLE) ×3 IMPLANT
TOWEL GREEN STERILE FF (TOWEL DISPOSABLE) ×3 IMPLANT
TRAY ENT MC OR (CUSTOM PROCEDURE TRAY) ×3 IMPLANT
TRAY FOLEY MTR SLVR 14FR STAT (SET/KITS/TRAYS/PACK) ×3 IMPLANT
TUBE CONNECTING 12X1/4 (SUCTIONS) ×3 IMPLANT
TUBE FEEDING 10FR FLEXIFLO (MISCELLANEOUS) IMPLANT
WATER STERILE IRR 1000ML POUR (IV SOLUTION) ×3 IMPLANT

## 2020-12-17 NOTE — Anesthesia Preprocedure Evaluation (Signed)
Anesthesia Evaluation  Patient identified by MRN, date of birth, ID band Patient awake    Reviewed: Allergy & Precautions, NPO status , Patient's Chart, lab work & pertinent test results  History of Anesthesia Complications Negative for: history of anesthetic complications  Airway Mallampati: I  TM Distance: >3 FB Neck ROM: Full    Dental  (+) Teeth Intact   Pulmonary neg pulmonary ROS,    breath sounds clear to auscultation       Cardiovascular hypertension, Pt. on medications (-) angina(-) Past MI and (-) CHF (-) dysrhythmias  Rhythm:Regular     Neuro/Psych negative neurological ROS  negative psych ROS   GI/Hepatic negative GI ROS, Neg liver ROS,   Endo/Other  negative endocrine ROS  Renal/GU negative Renal ROSLab Results      Component                Value               Date                      CREATININE               1.10                12/13/2020                Musculoskeletal negative musculoskeletal ROS (+)   Abdominal   Peds  Hematology negative hematology ROS (+) Lab Results      Component                Value               Date                      WBC                      5.6                 12/13/2020                HGB                      15.1                12/13/2020                HCT                      45.0                12/13/2020                MCV                      90.0                12/13/2020                PLT                      302                 12/13/2020              Anesthesia Other Findings   Reproductive/Obstetrics  Anesthesia Physical Anesthesia Plan  ASA: 2  Anesthesia Plan: General   Post-op Pain Management:    Induction: Intravenous  PONV Risk Score and Plan: 2 and Ondansetron and Dexamethasone  Airway Management Planned: Oral ETT  Additional Equipment: None  Intra-op Plan:   Post-operative Plan:  Extubation in OR  Informed Consent: I have reviewed the patients History and Physical, chart, labs and discussed the procedure including the risks, benefits and alternatives for the proposed anesthesia with the patient or authorized representative who has indicated his/her understanding and acceptance.     Dental advisory given  Plan Discussed with: CRNA and Anesthesiologist  Anesthesia Plan Comments:         Anesthesia Quick Evaluation

## 2020-12-17 NOTE — Anesthesia Procedure Notes (Addendum)
Procedure Name: Intubation Date/Time: 12/17/2020 7:41 AM Performed by: Adela Ports, CRNA Pre-anesthesia Checklist: Patient identified, Emergency Drugs available, Suction available and Patient being monitored Patient Re-evaluated:Patient Re-evaluated prior to induction Oxygen Delivery Method: Circle system utilized Preoxygenation: Pre-oxygenation with 100% oxygen Induction Type: IV induction Ventilation: Mask ventilation without difficulty Laryngoscope Size: Miller and 2 Grade View: Grade III Tube type: Oral Tube size: 6.5 mm Number of attempts: 1 Airway Equipment and Method: Stylet Placement Confirmation: ETT inserted through vocal cords under direct vision, positive ETCO2 and breath sounds checked- equal and bilateral Secured at: 22 cm Tube secured with: Tape Dental Injury: Teeth and Oropharynx as per pre-operative assessment

## 2020-12-17 NOTE — Interval H&P Note (Signed)
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  The various methods of treatment have been discussed with the patient and family. After consideration of risks, benefits and other options for treatment, the patient has consented to  Procedure(s): DIRECT LARYNGOSCOPY WITH BIOPSIES (N/A) ESOPHAGOSCOPY (N/A) LEFT NECK DISSECTION (Left) as a surgical intervention.  The patient's history has been reviewed, patient examined, no change in status, stable for surgery.  I have reviewed the patient's chart and labs.  Questions were answered to the patient's satisfaction.     Izora Gala

## 2020-12-17 NOTE — Transfer of Care (Signed)
Immediate Anesthesia Transfer of Care Note  Patient: Nicholas Daniels  Procedure(s) Performed: DIRECT LARYNGOSCOPY WITH BIOPSIES ESOPHAGOSCOPY LEFT NECK DISSECTION (Left)  Patient Location: PACU  Anesthesia Type:General  Level of Consciousness: drowsy and patient cooperative  Airway & Oxygen Therapy: Patient Spontanous Breathing  Post-op Assessment: Report given to RN  Post vital signs: Reviewed and stable  Last Vitals:  Vitals Value Taken Time  BP 165/88 12/17/20 1011  Temp    Pulse 94 12/17/20 1015  Resp 14 12/17/20 1015  SpO2 95 % 12/17/20 1015  Vitals shown include unvalidated device data.  Last Pain:  Vitals:   12/17/20 0625  TempSrc:   PainSc: 0-No pain         Complications: No notable events documented.

## 2020-12-17 NOTE — Op Note (Signed)
OPERATIVE REPORT  DATE OF SURGERY: 12/17/2020  PATIENT:  Lenon Oms,  70 y.o. 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  Left base of tongue biopsy 2.  Left modified neck dissection involving levels 1 2 and 3.  COUNTS:  Correct  PROCEDURE DETAILS: The patient was taken to the operating room and placed on the operating table in the supine position. Following induction of general endotracheal anesthesia, the table was turned 90 degrees and the patient was draped for endoscopy.  1.  Direct laryngoscopy with biopsy.  Maxillary tooth protector was used throughout the endoscopy.  A Jako laryngoscope was entered into the oral cavity used to inspect the oropharynx, hypopharynx and larynx.  There were no obvious lesions identified.  Random biopsy was taken from the left lateral tongue base lymphoid tissue and sent for pathologic evaluation.  The tonsils were symmetric and soft without any palpable masses.  The scope was removed.  2.  Esophagoscopy.  The rigid cervical esophagoscope was passed into the oral cavity through the esophageal introitus and passed into the cervical esophagus as far as I could reach.  The scope was slowly removed and secretions were suctioned while circumferentially inspecting the esophageal wall.  No lesions were noted.  3.  Left modified neck dissection, levels 1 2 and 3.  Neck was prepped and draped in a standard fashion.  A left and a apron incision was outlined with a marking pen.  Electrocautery was used to incise the skin and subcutaneous tissue and through the platysma layer.   Subplatysmal flaps were developed superiorly to the mandible and inferiorly to the level of the omohyoid muscle.  Stay sutures were used on the superior flap.  The great auricular nerve was identified and fascia just anterior to the nerve was dissected anteriorly off the sternocleidomastoid muscle.  The external jugular vein was ligated between clamps and divided.  The fascia was then dissected off the medial aspect of the sternocleidomastoid muscle.  The spinal sensory nerve was identified and dissected free all the way up to the medial aspect of the digastric muscle.  There was a large hard lymph node just anterior to the nerve.  There was a small lymph node in the tissue posterior superior to the nerve.  The fibrofatty tissue posterior superior to the nerve was dissected down to the fascia of the splenius capitis and levator scapular muscles.  This is all brought forward and underneath the nerve.  The anterior triangle was dissected.  The marginal mandibular branch of the facial nerve was identified and reflected superiorly.  The facial arteries were ligated between clamps and divided.  The submental fat pad was dissected free from between the digastric muscles.  The gland was brought anteriorly.  The omohyoid was identified.  This was reflected up anteriorly.  The submandibular ganglion and submandibular duct were ligated between clamps and divided.  The lingual artery was then ligated between clamps and divided as well.  Specimen was brought posteriorly towards the brain Veins.  There is a single large draining vein that was ligated between clamps and divided.  The glossal nerve was  identified traced back to its origin and preserved.  There was a small hard node approximately 1 cm right up against the hypoglossal nerve.  This was taken separately and sent with the main specimen.  The carotid system and vagus were identified and all fibrofatty tissue was dissected off of this following back towards the internal  jugular vein.  The jugular vein was relatively small caliber on the side and was cleaned of all fibrofatty tissue and preserved.  The cutaneous branches of the cervical plexus were preserved removing all fibrofatty tissue between and lateral to them.  The specimen was then removed oriented on a separate towel and sent for pathologic evaluation.  Cautery was used for completion of hemostasis as were vascular clips and 4-0 silk ties.  Wound was irrigated with saline.  The drain was exited through a separate stab incision secured in place with nylon suture.  The platysma layer was reapproximated interrupted 3-0 chromic suture and staples were used on the skin.  Bacitracin was applied.  Patient was awakened extubated and transferred to recovery in stable condition.    PATIENT DISPOSITION:  To PACU, stable

## 2020-12-17 NOTE — Progress Notes (Signed)
Post op check:  Awake and alert , complains of moderate pain.  Taking po well.  Breathing and voice are clear. Mild weakness left lower lip, Drain and flaps lok good.  Stable post op, continue overnight care.

## 2020-12-18 ENCOUNTER — Encounter (HOSPITAL_COMMUNITY): Payer: Self-pay | Admitting: Otolaryngology

## 2020-12-18 MED ORDER — ONDANSETRON 8 MG PO TBDP: 8.0000 mg | ORAL_TABLET | Freq: Three times a day (TID) | ORAL | 0 refills | Status: DC | PRN

## 2020-12-18 MED ORDER — HYDROCODONE-ACETAMINOPHEN 7.5-325 MG PO TABS: 1.0000 | ORAL_TABLET | Freq: Four times a day (QID) | ORAL | 0 refills | Status: DC | PRN

## 2020-12-18 NOTE — Progress Notes (Signed)
Postop day 1-  Doing well, moderate amount of pain but he is eating and drinking well.  Neck looks excellent.  No signs of infection or hematoma.  Slight weakness lower lip but no other neuropathy.  Stable postop.  He would like to go home with the drain.  I think that is reasonable.  He will follow-up Thursday in the office for drain removal.

## 2020-12-18 NOTE — Discharge Summary (Signed)
Physician Discharge Summary  Patient ID: Nicholas Daniels MRN: [id number] DOB/AGE: [id number] 71 y.o.  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 rate 17, height '5\' 7"'$  (1.702 m), weight 90.7 kg, SpO2 97 %. PHYSICAL EXAM: Awake and alert.  Breathing and voice are clear.  Cranial nerves all functioning except for mild weakness of the left lower lip.  Incision intact and looks excellent.  Drain is functioning.  Disposition: Discharge disposition: 01-Home or Self Care       Discharge Instructions     Diet - low sodium heart healthy   Complete by: As directed    Discharge wound care:   Complete by: As directed    Keep the incision clean and dry.  Apply bacitracin ointment to the incision twice daily.  Empty the drain, record the amount, recharge the drain, 3 times daily, more if necessary.   Increase activity slowly   Complete by: As directed       Allergies as of 12/18/2020   No Known Allergies      Medication List     TAKE these medications    aspirin 81 MG EC tablet Take 162 mg by mouth at bedtime.   Calcium 500/D 500-200 MG-UNIT Tabs Generic drug: Calcium Carb-Cholecalciferol Take 1 tablet by mouth at bedtime.   diltiazem 120 MG 24 hr capsule Commonly known as: CARDIZEM CD Take 240 mg by mouth at bedtime.   Flaxseed Oil 1200 MG Caps Take 2,400 mg by mouth at bedtime.   HYDROcodone-acetaminophen 7.5-325 MG tablet Commonly known as: Norco Take 1 tablet by mouth every 6  (six) hours as needed for moderate pain.   latanoprost 0.005 % ophthalmic solution Commonly known as: XALATAN Place 1 drop into both eyes at bedtime.   multivitamin tablet Take 1 tablet by mouth at bedtime.   niacin 500 MG CR capsule Take 500 mg by mouth at bedtime.   omeprazole 40 MG capsule Commonly known as: PRILOSEC TAKE 1 CAPSULE BY MOUTH EVERY DAY   ondansetron 8 MG disintegrating tablet Commonly known as: Zofran ODT Take 1 tablet (8 mg total) by mouth every 8 (eight) hours as needed for nausea or vomiting.   sucralfate 1 g tablet Commonly known as: CARAFATE TAKE ONE TABLET BY MOUTH TWICE DAILY What changed: when to take this   timolol 0.5 % ophthalmic solution Commonly known as: TIMOPTIC Place 1 drop into both eyes daily.   vitamin D3 50 MCG (2000 UT) Caps Take 2,000 Units by mouth at bedtime.               Discharge Care Instructions  (From admission, onward)           Start     Ordered   12/18/20 0000  Discharge wound care:       Comments: Keep the incision clean and dry.  Apply bacitracin ointment to the incision twice daily.  Empty the drain, record the amount, recharge the drain, 3 times daily, more if necessary.   12/18/20 0855            Follow-up Information  Izora Gala, MD Follow up on 12/20/2020.   Specialty: Otolaryngology Why: Come to the office at 1 PM for drain removal. Contact information: [address] [place] [address] [phone]                 Signed: Izora Gala 12/18/2020, 8:56 AM

## 2020-12-18 NOTE — Progress Notes (Signed)
Discharge instructions (including medications) discussed with and copy provided to patient/caregiver 

## 2020-12-18 NOTE — Anesthesia Postprocedure Evaluation (Signed)
Anesthesia Post Note  Patient: Nicholas Daniels  Procedure(s) Performed: DIRECT LARYNGOSCOPY WITH BIOPSIES ESOPHAGOSCOPY LEFT NECK DISSECTION (Left)     Patient location during evaluation: PACU Anesthesia Type: General Level of consciousness: awake and alert Pain management: pain level controlled Vital Signs Assessment: post-procedure vital signs reviewed and stable Respiratory status: spontaneous breathing, nonlabored ventilation, respiratory function stable and patient connected to nasal cannula oxygen Cardiovascular status: blood pressure returned to baseline and stable Postop Assessment: no apparent nausea or vomiting Anesthetic complications: no   No notable events documented.  Last Vitals:  Vitals:   12/18/20 0419 12/18/20 0831  BP: (!) 136/58 122/63  Pulse: 79 82  Resp: 17 17  Temp: 36.7 C 37 C  SpO2: 94% 97%    Last Pain:  Vitals:   12/18/20 0831  TempSrc: Oral  PainSc:                  Fiore Detjen

## 2020-12-24 ENCOUNTER — Other Ambulatory Visit: Payer: Self-pay | Admitting: Gastroenterology

## 2020-12-26 ENCOUNTER — Other Ambulatory Visit: Payer: Self-pay

## 2020-12-26 DIAGNOSIS — C770 Secondary and unspecified malignant neoplasm of lymph nodes of head, face and neck: Secondary | ICD-10-CM

## 2020-12-28 ENCOUNTER — Other Ambulatory Visit: Payer: Self-pay | Admitting: Gastroenterology

## 2020-12-28 NOTE — Progress Notes (Signed)
Oncology Nurse Navigator Documentation   Placed introductory call to new referral patient Nicholas Daniels.  Introduced myself as the H&N oncology nurse navigator that works with Dr. Isidore Moos to whom he has been referred by Dr. Constance Holster. He confirmed understanding of referral. Briefly explained my role as his navigator, provided my contact information.  Confirmed understanding of upcoming appts and [place] location, explained arrival and registration process. I encouraged him to call with questions/concerns as he moves forward with appts and procedures.   He verbalized understanding of information provided, expressed appreciation for my call.   Navigator Initial Assessment Employment Status: he is retired.  Currently on FMLA / STD:no Living Situation: he lives with his wife.  Support System: wife, family CSP:ZZCKIC Philip Aspen MD PCD: Financial Concerns:no Transportation Needs: no Sensory Deficits:no Language Barriers/Interpreter Needed:  no Ambulation Needs: no DME Used in Home: no Psychosocial Needs:  no Concerns/Needs Understanding Cancer:  addressed/answered by navigator to best of ability Self-Expressed Needs: no   Clinical biochemist, BSN, OCN Head & Neck Oncology Nurse [place] at [facility] Phone # [phone]  Fax # [phone]

## 2020-12-31 ENCOUNTER — Other Ambulatory Visit: Payer: Self-pay

## 2020-12-31 ENCOUNTER — Ambulatory Visit (HOSPITAL_COMMUNITY)
Admission: RE | Admit: 2020-12-31 | Discharge: 2020-12-31 | Disposition: A | Discharge status: Home / Self Care | Payer: PPO | Source: Ambulatory Visit | Attending: Radiation Oncology | Admitting: Radiation Oncology

## 2020-12-31 DIAGNOSIS — I700 Atherosclerosis of aorta: Secondary | ICD-10-CM | POA: Diagnosis not present

## 2020-12-31 DIAGNOSIS — C770 Secondary and unspecified malignant neoplasm of lymph nodes of head, face and neck: Secondary | ICD-10-CM | POA: Insufficient documentation

## 2020-12-31 MED ORDER — IOHEXOL 350 MG/ML SOLN
60.0000 mL | Freq: Once | INTRAVENOUS | Status: AC | PRN
  Administered 2020-12-31: 60 mL via INTRAVENOUS

## 2020-12-31 NOTE — Progress Notes (Signed)
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 He has had recent elevation of PSA with unknown source. Most recent PET scan in May revealed enlarged lymph nodes left level 2 with minimal uptake. He had FNA of this recently which reveals carcinoma of unknown type  Biopsies revealed:  12/17/2020 FINAL MICROSCOPIC DIAGNOSIS:  A. TONGUE, LEFT BASE, BIOPSY:  - Benign squamous mucosa with lymphoid and salivary gland tissue.  B. LYMPH NODE, LEFT NECK LEVEL 1-3, DISSECTION:  - Metastatic squamous cell carcinoma in two of fifteen lymph nodes (2/15).  - Extracapsular extension present.  - Largest deposit is estimated 3.9 cm.  - Benign salivary gland tissue.  COMMENT:  B. The positive lymph nodes correspond with level 2 lymph nodes. Immunohistochemistry is positive for cytokeratin 5/6, p16 (strong diffuse), p40, p63, PAX 8 (weak).  The cells are negative for Napsin A, GATA3, cytokeratin 20, cytokeratin 7, CDX-2, PSA, Prostein and TTF-1. The overall findings are consistent with metastatic squamous cell carcinoma  Nutrition Status Yes No Comments  Weight changes? []  [x]    Swallowing concerns? []  [x]  Patient denies  PEG? []  [x]     Referrals Yes No Comments  Social Work? [x]  []    Dentistry? [x]  []    Swallowing therapy? [x]  []    Nutrition? [x]  []    Med/Onc? [x]  []  Dr. Zola Button   Safety Issues Yes No Comments  Prior radiation? [x]  []  08/09/2015 10 Gy in 1 fraction to bilateral breasts (Gynecomastia Prevention) 09/16/2010-10/31/2010: Prostate (patient believes he received 40Gy)  Pacemaker/ICD? []  []    Possible current pregnancy? []  [x]  N/A  Is the patient on methotrexate? []  []     Tobacco/Marijuana/Snuff/ETOH use: Patient has never smoked or used smokeless tobacco (does  report history of second hand smoke exposure during his youth). Denies any alcohol consumption or recreational drug use  Past/Anticipated interventions by otolaryngology, if any:  12/17/2020 Dr. Izora Gala --[place]  Past/Anticipated interventions by medical oncology, if any:  Under care of Dr. Zola Button 10/23/2020 Left cervical lymphadenopathy that is biopsy-proven to show poorly differentiated carcinoma confirmed on October 05, 2020 Management options at this time were discussed.  These would include systemic therapy utilizing chemotherapy dedicated for unknown primary Radiation therapy or primary surgical resection to obtain more tissue could also be considered.  Lymph node dissection may be a reasonable approach at this point which will serve as a both diagnostic and therapeutic approach.  After discussion today, he favors proceeding with surgery as weight will provide additional information regarding this malignancy Prostate cancer: Diagnosed in 2009 with Gleason score of 7 and status post prostatectomy It is unclear whether the cervical adenopathy and poorly differentiated carcinoma is related to prostate cancer versus two different entities.   At this time, I recommended continued active surveillance and reserve additional systemic therapy in the form of abiraterone, systemic chemotherapy to a later date Follow-up: will be after completing his surgical lymph node resection F/U scheduled for 01/10/2021  Current Complaints / other details:  Nothing else of note

## 2020-12-31 NOTE — Progress Notes (Signed)
Radiation Oncology         [phone]) [phone] ________________________________  Initial Outpatient Consultation  Name: Nicholas Daniels MRN: [id number]  Date: 01/01/2021  DOB: [birth year]  UY:[id number], [name], MD  Izora Gala, MD   REFERRING PHYSICIAN: Izora Gala, MD  DIAGNOSIS:    ICD-10-CM   1. 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 male with a history of prostate cancer who presented with recent elevation of PSA (0.56) this past May with unknown source. Given PSA elevation, the patient underwent  Prostate specific PET scan on 08/31/20 which demonstrated enlarged level 2 lymph nodes with minimal uptake concerning for neoplasm. Ultrasound of head/neck on 09/20/20 further demonstrated an adjacent enlarged lymph node within the left upper neck region concerning for level 2 lymphadenopathy.  Left cervical lymph node biopsy performed on 10/05/20 revealed: poorly differentiated carcinoma. PSA staining on the pathology was negative with PSMA imaging showing no activity.  Subsequently, the patient saw Dr. Alen Blew on 10/23/20 to discuss treatment options. Following discussion, the patient favored proceeding with surgery and was referred to Dr. Constance Holster on 11/22/20 to discuss this further. During this consultation, Dr. Constance Holster consulted with Dr. Alen Blew who recommended the patient to undergo formal neck dissection with endoscopy.  Neck CT  performed on 12/13/20 redemonstrated the enlarged left level 2A lymph node. Lymph node appeared slightly decreased in size from the prior imaging but remained abnormal in morphology. CT also demonstrated the additional sub-centimeter hyperenhancing lymph nodes in the left neck; anterior and posterior to the previously biopsied lymph node, as prominent and enlarged slightly from the prior exam.  The patient opted to proceed with left neck level 1-3 lymph node biopsies on 12/17/20 under the care of Dr. Constance Holster. Pathology from the procedure revealed metastatic squamous cell carcinoma in two of fifteen lymph nodes, with extracapsular extension present. Largest deposit was found to measure approximately 3.9 cm. Left tongue base biopsy also performed at this time revealed benign squamous mucosa. (Patient noted to heal well from biopsies during post-procedure follow-up on 12/26/20).  Chest CT performed on 12/31/20 demonstrated no mets  Swallowing issues, if any: none  Weight Changes: none  Pain status: none  Tobacco history, if any: none  ETOH abuse, if any: none  Prior cancers, if any: Yes, prostate cancer diagnosed in 2009 with Gleason score of 7 and status post prostatectomy.  He developed relapsed disease in 2012 and received salvage radiation therapy.  (He also received enzalutamide as a part of the empiric trial and currently off of it).  PREVIOUS RADIATION THERAPY: Yes,  --Salvage radiation treatment to the prostatic fossa in 2012 --(Recurrent prostate cancer) : 10 Gy to bilateral breast/chest wall in 1 fraction on 08/19/15 for Gynecomastia prevention (Dr. Lisbeth Renshaw)  PAST MEDICAL HISTORY:  has a past medical history of Cataract, Cough, Glaucoma, Hypertension, PFO (patent foramen ovale), and Prostate cancer ([place]) (2009).    PAST SURGICAL HISTORY: Past Surgical History:  Procedure Laterality Date   CATARACT EXTRACTION     bilateral and a wrinkle on retina on the left    CHOLECYSTECTOMY      COLONOSCOPY     DIRECT LARYNGOSCOPY N/A 12/17/2020   Procedure: DIRECT LARYNGOSCOPY WITH BIOPSIES;  Surgeon: Izora Gala, MD;  Location: [facility];  Service: ENT;  Laterality: N/A;   ESOPHAGOSCOPY N/A 12/17/2020   Procedure: ESOPHAGOSCOPY;  Surgeon: Izora Gala, MD;  Location: [facility] OR;  Service: ENT;  Laterality: N/A;   PROSTATE SURGERY  2009   radiation treatment     RADICAL NECK DISSECTION Left 12/17/2020   Procedure: LEFT NECK DISSECTION;  Surgeon: Izora Gala, MD;  Location: [facility];  Service: ENT;  Laterality: Left;   RESECTION OF MEDIASTINAL MASS N/A 02/26/2015   Procedure: RESECTION OF MEDIASTINAL MASS;  Surgeon: Ivin Poot, MD;  Location: [facility];  Service: Thoracic;  Laterality: N/A;   STERNOTOMY N/A 02/26/2015   Procedure: STERNOTOMY;  Surgeon: Ivin Poot, MD;  [facility];  Service: Thoracic;  Laterality: N/A;   UPPER GASTROINTESTINAL ENDOSCOPY    Path from mediastinal mass in 2016 was benign  FAMILY HISTORY: family history includes COPD in his brother, father, and mother; Cancer in his brother; Colon polyps in an other family member; Diabetes in his mother; Heart disease in his brother and mother; Liver cancer in his brother; Pneumonia in his mother.  SOCIAL HISTORY:  reports that he has never smoked. He has been exposed to tobacco smoke. He has never used smokeless tobacco. He reports that he does not drink alcohol and does not use drugs.  ALLERGIES: Patient has no known allergies.  MEDICATIONS:  Current Outpatient Medications  Medication Sig Dispense Refill   aspirin 81 MG EC tablet Take 162 mg by mouth at bedtime.     calcium-vitamin D (CALCIUM 500/D) 500-200 MG-UNIT tablet Take 1 tablet by mouth at bedtime.     diltiazem (CARDIZEM CD) 120 MG 24 hr capsule Take 240 mg by mouth at bedtime.     Flaxseed, Linseed, (FLAXSEED OIL) 1200 MG CAPS Take 2,400 mg by mouth at bedtime.     HYDROcodone-acetaminophen (NORCO) 7.5-325 MG tablet Take 1 tablet by mouth every 6 (six)  hours as needed for moderate pain. 20 tablet 0   latanoprost (XALATAN) 0.005 % ophthalmic solution Place 1 drop into both eyes at bedtime.     Multiple Vitamin (MULTIVITAMIN) tablet Take 1 tablet by mouth at bedtime.     niacin 500 MG CR capsule Take 500 mg by mouth at bedtime.     omeprazole (PRILOSEC) 40 MG capsule TAKE 1 CAPSULE BY MOUTH EVERY DAY 90 capsule 3   ondansetron (ZOFRAN ODT) 8 MG disintegrating tablet Take 1 tablet (8 mg total) by mouth every 8 (eight) hours as needed for nausea or vomiting. 20 tablet 0   sucralfate (CARAFATE) 1 g tablet TAKE ONE TABLET BY MOUTH TWICE DAILY (Patient taking differently: Take 1 g by mouth at bedtime.) 60 tablet 0   timolol (TIMOPTIC) 0.5 % ophthalmic solution Place 1 drop into both eyes daily.     Vitamin D, Cholecalciferol, 50 MCG (2000 UT) CAPS Take 2,000 Units by mouth at bedtime.     No current facility-administered medications for this encounter.    REVIEW OF SYSTEMS:  Notable for that above.   PHYSICAL EXAM:  height is 5\' 7"  (1.702 m) and weight is 198 lb 6.4 oz (90 kg). His temperature is 97.8 F (36.6 C). His blood pressure is  110/62 and his pulse is 60. His respiration is 20 and oxygen saturation is 97%.   General: Alert and oriented, in no acute distress HEENT: Head is normocephalic. Extraocular movements are intact. Oropharynx is notable for no masses. Neck: Neck is notable for no palpable adenopathy. Healing along left neck dissection scar. Heart: Regular in rate and rhythm with no murmurs, rubs, or gallops. Chest: Clear to auscultation bilaterally, with no rhonchi, wheezes, or rales. Abdomen: Soft, nontender, nondistended, with no rigidity or guarding. Lymphatics: see Neck Exam Skin: No concerning lesions. Musculoskeletal: symmetric strength and muscle tone throughout. Neurologic: Cranial nerves II through XII are grossly intact. No obvious focalities. Speech is fluent. Coordination is intact. Psychiatric: Judgment and insight are  intact. Affect is appropriate.   ECOG = 1  0 - Asymptomatic (Fully active, able to carry on all predisease activities without restriction)  1 - Symptomatic but completely ambulatory (Restricted in physically strenuous activity but ambulatory and able to carry out work of a light or sedentary nature. For example, light housework, office work)  2 - Symptomatic, <50% in bed during the day (Ambulatory and capable of all self care but unable to carry out any work activities. Up and about more than 50% of waking hours)  3 - Symptomatic, >50% in bed, but not bedbound (Capable of only limited self-care, confined to bed or chair 50% or more of waking hours)  4 - Bedbound (Completely disabled. Cannot carry on any self-care. Totally confined to bed or chair)  5 - Death   Eustace Pen MM, Creech RH, Tormey DC, et al. [phone]). "Toxicity and response criteria of the [facility]". [place] Oncol. 5 (6): 649-55   LABORATORY DATA:  Lab Results  Component Value Date   WBC 5.6 12/13/2020   HGB 15.1 12/13/2020   HCT 45.0 12/13/2020   MCV 90.0 12/13/2020   PLT 302 12/13/2020   CMP     Component Value Date/Time   NA 139 12/13/2020 1430   K 4.0 12/13/2020 1430   CL 107 12/13/2020 1430   CO2 25 12/13/2020 1430   GLUCOSE 76 12/13/2020 1430   BUN 10 12/13/2020 1430   CREATININE 1.10 12/13/2020 1430   CREATININE 1.10 02/13/2015 1545   CALCIUM 8.9 12/13/2020 1430   PROT 5.3 (L) 02/28/2015 0236   ALBUMIN 2.7 (L) 02/28/2015 0236   AST 38 02/28/2015 0236   ALT 72 (H) 02/28/2015 0236   ALKPHOS 22 (L) 02/28/2015 0236   BILITOT 1.9 (H) 02/28/2015 0236   GFRNONAA >60 12/13/2020 1430   GFRAA >60 02/28/2015 0236      No results found for: TSH   RADIOGRAPHY: CT SOFT TISSUE NECK W CONTRAST  Result Date: 12/14/2020 CLINICAL DATA:  Left lymph node, concerning for neoplasm. EXAM: CT NECK WITH CONTRAST TECHNIQUE: Multidetector CT imaging of the neck was performed using the standard  protocol following the bolus administration of intravenous contrast. CONTRAST:  49mL ISOVUE-300 IOPAMIDOL (ISOVUE-300) INJECTION 61% COMPARISON:  No prior neck CT, correlation made with ultrasound 09/20/2020 and PET-CT 08/31/2020. FINDINGS: Pharynx and larynx: Normal. No mass or swelling. Salivary glands: Choose 1 Thyroid: Choose 1 Lymph nodes: Redemonstrated enlarged left level 2a lymph node, which measures up to 2.2 x 1.5 x 3.1 cm (AP x TR x CC) (series 2, image 47 and series 12, image 87), previously 2.4 x 1.9 cm in the axial plane. Adjacent hyperenhancing left level 2A lymph node measures up to 0.7 x 0.6 x 1.0 cm (series 2, image 45  and series 12, image 81), which is not definitively visualized on the prior exam secondary to absence of contrast and beam hardening artifact from the patient's dental hardware. Slight interval enlargement in a more posterior, hyperenhancing left level 2 lymph node (series 2, image 40 and series 12, image 90), which measures 0.7 x 0.5 cm, previously 0.6 x 0.4 cm. Vascular: Negative. Limited intracranial: Negative. Visualized orbits: Limited inclusion in the field of view. Mastoids and visualized paranasal sinuses: Clear. Skeleton: Status post median sternotomy. No acute osseous abnormality. Upper chest: Negative. Other: None. IMPRESSION: Redemonstrated enlarged left level 2A lymph node, which may be slightly decreased in size from the prior exam but remain abnormal in morphology and enhancement. Additional subcentimeter hyperenhancing lymph nodes in the left neck anterior and posterior to the previously biopsied lymph node, are prominent and may have enlarged slightly from the prior exam. Electronically Signed   By: Merilyn Baba M.D.   On: 12/14/2020 16:18   CT Chest W Contrast  Result Date: 12/31/2020 CLINICAL DATA:  Staging of head neck cancer. History of metastatic prostate cancer. EXAM: CT CHEST WITH CONTRAST TECHNIQUE: Multidetector CT imaging of the chest was performed  during intravenous contrast administration. CONTRAST:  10mL OMNIPAQUE IOHEXOL 350 MG/ML SOLN COMPARISON:  PET 08/31/2020. A chest CT of 07/13/2015 is also reviewed. FINDINGS: Cardiovascular: Aortic and branch vessel atherosclerosis. Tortuous thoracic aorta. Borderline cardiomegaly. Lad coronary artery calcification. No central pulmonary embolism, on this non-dedicated study. Mediastinum/Nodes: No axillary adenopathy. No mediastinal or hilar adenopathy. Lungs/Pleura: No pleural fluid. Minimal posterior right upper lobe subpleural nodularity on 22/5 is similar to the prior and considered benign. Upper Abdomen: Mild hepatic steatosis. Cholecystectomy with upper normal common duct caliber, similar and within normal variation. Normal imaged portions of the spleen, pancreas, stomach, right adrenal gland, kidneys. Minimal left adrenal nodularity. Musculoskeletal: Prior median sternotomy. No acute osseous abnormality. IMPRESSION: 1. No acute process or evidence of metastatic disease in the chest. 2. Coronary artery atherosclerosis. Aortic Atherosclerosis (ICD10-I70.0). Electronically Signed   By: Abigail Miyamoto M.D.   On: 12/31/2020 18:19   OCT, Retina - OU - Both Eyes  Result Date: 12/13/2020 Right Eye Scan locations included subfoveal. Central Foveal Thickness: 281. Progression has been stable. Findings include epiretinal membrane. Left Eye Quality was good. Scan locations included subfoveal. Central Foveal Thickness: 456. Progression has been stable. Findings include epiretinal membrane. Notes Minor epiretinal membrane with no topographic distortion OD, temporal to fovea. OS with overall stable appearance and retinal thickening of the left eye from epiretinal membrane, what is new and now slightly progressed however is a small outer retinal nodule of subretinal debris, this could be a change in the photoreceptor layer.  With increased thickening, will need to observe closely     IMPRESSION/PLAN:  This is a  delightful patient with head and neck cancer. The primary is not known. We will arrange an FDG PET as the PET he had for staging was prostate specific and not a reliable method to determine the primary site related to squamous cell carcinoma.   Ultimately, I anticipate I will recommend radiotherapy for this patient Anticipate treating the b/I neck and throat; treatment to the throat can be more targeted if the PET revealed the primary site. Anticipate 6-7 weeks of RT.  EBV requested on his path. P16 status is +.  Anticipate concurrent chemotherapy due to ECE+  Pt is seeing Dr. Alen Blew.  We discussed the potential risks, benefits, and side effects of radiotherapy. We talked in detail  about acute and late effects. We discussed that some of the most bothersome acute effects may be mucositis, dysgeusia, salivary changes, skin irritation, hair loss, dehydration, weight loss and fatigue. We talked about late effects which include but are not necessarily limited to dysphagia, hypothyroidism, nerve injury, vascular injury, spinal cord injury, xerostomia, trismus, neck edema, and potential injury to any of the tissues in the head and neck region. No guarantees of treatment were given. A consent form was signed and placed in the patient's medical record. The patient is enthusiastic about proceeding with treatment. I look forward to participating in the patient's care.    Simulation (treatment planning) will take place after PET and clearance from dentistry  We also discussed that the treatment of head and neck cancer is a multidisciplinary process to maximize treatment outcomes and quality of life. For this reason the following referrals have been or will be made:   Medical oncology to discuss chemotherapy    Dentistry for dental evaluation, possible extractions in the radiation fields, and /or advice on reducing risk of cavities, osteoradionecrosis, or other oral issues.   Nutritionist for nutrition support  during and after treatment.   Speech language pathology for swallowing and/or speech therapy.   Social work for social support.    Physical therapy due to risk of lymphedema in neck and deconditioning.   Baseline labs including TSH.  Pt understands he may need PAC and PEG tube if concurrent chemotherapy is given.  On date of service, in total, I spent 60 minutes on this encounter. Patient was seen in person.  __________________________________________   Eppie Gibson, MD  This document serves as a record of services personally performed by Eppie Gibson, MD. It was created on her behalf by Roney Mans, a trained medical scribe. The creation of this record is based on the scribe's personal observations and the provider's statements to them. This document has been checked and approved by the attending provider.

## 2021-01-01 ENCOUNTER — Encounter: Payer: Self-pay | Admitting: Radiation Oncology

## 2021-01-01 ENCOUNTER — Other Ambulatory Visit: Payer: Self-pay

## 2021-01-01 ENCOUNTER — Ambulatory Visit
Admission: RE | Admit: 2021-01-01 | Discharge: 2021-01-01 | Disposition: A | Discharge status: Home / Self Care | Payer: PPO | Source: Ambulatory Visit | Attending: Radiation Oncology | Admitting: Radiation Oncology

## 2021-01-01 ENCOUNTER — Telehealth: Payer: Self-pay | Admitting: *Deleted

## 2021-01-01 VITALS — BP 110/62 | HR 60 | Temp 97.8°F | Resp 20 | Ht 67.0 in | Wt 198.4 lb

## 2021-01-01 DIAGNOSIS — I10 Essential (primary) hypertension: Secondary | ICD-10-CM | POA: Insufficient documentation

## 2021-01-01 DIAGNOSIS — I2510 Atherosclerotic heart disease of native coronary artery without angina pectoris: Secondary | ICD-10-CM | POA: Diagnosis not present

## 2021-01-01 DIAGNOSIS — K760 Fatty (change of) liver, not elsewhere classified: Secondary | ICD-10-CM | POA: Insufficient documentation

## 2021-01-01 DIAGNOSIS — Z923 Personal history of irradiation: Secondary | ICD-10-CM | POA: Insufficient documentation

## 2021-01-01 DIAGNOSIS — C770 Secondary and unspecified malignant neoplasm of lymph nodes of head, face and neck: Secondary | ICD-10-CM

## 2021-01-01 DIAGNOSIS — Z8546 Personal history of malignant neoplasm of prostate: Secondary | ICD-10-CM | POA: Diagnosis not present

## 2021-01-01 DIAGNOSIS — C760 Malignant neoplasm of head, face and neck: Secondary | ICD-10-CM | POA: Insufficient documentation

## 2021-01-01 DIAGNOSIS — I700 Atherosclerosis of aorta: Secondary | ICD-10-CM | POA: Diagnosis not present

## 2021-01-01 DIAGNOSIS — Z7982 Long term (current) use of aspirin: Secondary | ICD-10-CM | POA: Diagnosis not present

## 2021-01-01 DIAGNOSIS — R5381 Other malaise: Secondary | ICD-10-CM

## 2021-01-01 DIAGNOSIS — Z79899 Other long term (current) drug therapy: Secondary | ICD-10-CM | POA: Diagnosis not present

## 2021-01-01 DIAGNOSIS — Z86010 Personal history of colonic polyps: Secondary | ICD-10-CM | POA: Diagnosis not present

## 2021-01-01 DIAGNOSIS — C801 Malignant (primary) neoplasm, unspecified: Secondary | ICD-10-CM | POA: Insufficient documentation

## 2021-01-01 DIAGNOSIS — R5383 Other fatigue: Secondary | ICD-10-CM

## 2021-01-01 NOTE — Progress Notes (Signed)
Oncology Nurse Navigator Documentation   Met with patient during initial consult with Dr. Isidore Moos. He was accompanied by his wife, Santiago Glad Further introduced myself as his/their Navigator, explained my role as a member of the Care Team. Provided New Patient Information packet: Contact information for physician, this navigator, other members of the Care Team Advance Directive information ([place] blue pamphlet with LCSW insert); provided [facility] AD booklet at his request,  Fall Prevention Patient [place] Information sheet Symptom Management Clinic information [facility] campus map with highlight of [place] SLP Information sheet Provided and discussed educational handouts for PEG and PAC. Assisted with post-consult appt scheduling. I toured him to the radiation treatment area, explained procedures for lobby registration, arrival to Radiation Waiting, and arrival to treatment area.  He voiced understanding.   They verbalized understanding of information provided. I encouraged them to call with questions/concerns moving forward.  Harlow Asa, RN, BSN, OCN Head & Neck Oncology Nurse [place] at [place] [phone]

## 2021-01-01 NOTE — Telephone Encounter (Signed)
Called patient to inform of Pet Scan for 01-07-21- arrival time- 1:00 pm @ [facility], patient to be NPO- 6 hrs.prior  test patient to have water only prior to test, spoke with patient's wife - Santiago Glad and she is aware of this scan

## 2021-01-04 NOTE — Progress Notes (Signed)
Oncology Nurse Navigator Documentation   At the request of Dr. Isidore Moos I called [name] and requested EBV testing be added to Nicholas Daniels's recent biopsy. I spoke with Dominica and she verbalized that she would put the request in to a Pathologist.   Harlow Asa RN, BSN, [place] Neck Oncology Nurse [place] at [facility] Phone # [phone]  Fax # [phone]

## 2021-01-07 ENCOUNTER — Ambulatory Visit
Admission: RE | Admit: 2021-01-07 | Discharge: 2021-01-07 | Disposition: A | Discharge status: Home / Self Care | Payer: PPO | Source: Ambulatory Visit | Attending: Radiation Oncology | Admitting: Radiation Oncology

## 2021-01-07 ENCOUNTER — Other Ambulatory Visit: Payer: Self-pay

## 2021-01-07 DIAGNOSIS — C770 Secondary and unspecified malignant neoplasm of lymph nodes of head, face and neck: Secondary | ICD-10-CM | POA: Diagnosis not present

## 2021-01-07 DIAGNOSIS — Z8546 Personal history of malignant neoplasm of prostate: Secondary | ICD-10-CM | POA: Insufficient documentation

## 2021-01-07 DIAGNOSIS — C760 Malignant neoplasm of head, face and neck: Secondary | ICD-10-CM | POA: Diagnosis not present

## 2021-01-07 LAB — GLUCOSE, CAPILLARY: Glucose-Capillary: 106 mg/dL — ABNORMAL HIGH (ref 70–99)

## 2021-01-07 MED ORDER — FLUDEOXYGLUCOSE F - 18 (FDG) INJECTION
10.3000 | Freq: Once | INTRAVENOUS | Status: AC | PRN
  Administered 2021-01-07: 11.07 via INTRAVENOUS

## 2021-01-08 ENCOUNTER — Ambulatory Visit (INDEPENDENT_AMBULATORY_CARE_PROVIDER_SITE_OTHER): Payer: PPO | Admitting: Dentistry

## 2021-01-08 ENCOUNTER — Encounter (HOSPITAL_COMMUNITY): Payer: Self-pay | Admitting: Dentistry

## 2021-01-08 DIAGNOSIS — Z01818 Encounter for other preprocedural examination: Secondary | ICD-10-CM

## 2021-01-08 DIAGNOSIS — C770 Secondary and unspecified malignant neoplasm of lymph nodes of head, face and neck: Secondary | ICD-10-CM | POA: Diagnosis not present

## 2021-01-08 DIAGNOSIS — K030 Excessive attrition of teeth: Secondary | ICD-10-CM

## 2021-01-08 DIAGNOSIS — K0510 Chronic gingivitis, plaque induced: Secondary | ICD-10-CM

## 2021-01-08 DIAGNOSIS — K08109 Complete loss of teeth, unspecified cause, unspecified class: Secondary | ICD-10-CM

## 2021-01-08 DIAGNOSIS — M2632 Excessive spacing of fully erupted teeth: Secondary | ICD-10-CM

## 2021-01-08 DIAGNOSIS — K06010 Localized gingival recession, unspecified: Secondary | ICD-10-CM

## 2021-01-08 NOTE — Progress Notes (Signed)
Department of Dental Medicine             OUTPATIENT CONSULT   Service Date:   01/08/2021  Patient Name:   Nicholas Daniels Date of Birth:   [birth year] Medical Record Number: [id number]  Referring Provider:                Eppie Gibson, M.D.   PLAN/RECOMMENDATIONS:  Assessment There are no current signs of acute odontogenic infection including abscess, edema or erythema, or suspicious lesion requiring biopsy.    Recommendations No dental intervention indicated prior to radiation therapy at this time.   Plan Discuss case with medical team. Follow-up after the completion of radiation therapy and then return to primary dentist for routine care including cleanings and exams.  Discussed in detail all treatment options and recommendations with the patient and they are agreeable to the plan.    Thank you for consulting with [facility] and for the opportunity to participate in this patient's treatment.  Should you have any questions or concerns, please contact the [facility] at [phone].   01/08/2021 Consult Note:  COVID-19 SCREENING:  The patient denies symptoms concerning for COVID-19 infection including fever, chills, cough, or newly developed shortness of breath.   HISTORY OF PRESENT ILLNESS: Nicholas Daniels is a very pleasant 70 y.o. 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  The patient presents today for a medically necessary dental consultation as part of their pre-radiation therapy work-up.   DENTAL HISTORY: The patient reports that his primary dental office is [facility].  He reports going routinely for cleanings and exams, and that his next recall appointment is scheduled for the end of October.  He currently denies any dental/orofacial pain or sensitivity. He endorses numbness on the left  side of his face which has been persistent since his surgery. Patient is able to manage oral secretions.  Patient denies dysphagia, odynophagia, dysphonia, SOB and neck pain.  Patient denies fever, rigors and malaise.   CHIEF COMPLAINT:  Here for a pre-head and neck radiation dental exam.   Patient Active Problem List   Diagnosis Date Noted   Head and neck cancer ([place]) 01/01/2021   Metastatic cancer to cervical lymph nodes ([place]) 12/17/2020   SPK (superficial punctate keratitis), left 12/13/2020   Dry eyes, bilateral 12/13/2020   Macular retinoschisis, left 11/08/2020   Left epiretinal membrane 10/17/2019   Right epiretinal membrane 10/17/2019   Early stage nonexudative age-related macular degeneration of both eyes 10/17/2019   Drug-induced gynecomastia 08/14/2015   Mediastinal mass 08/09/2014   History of prostate cancer 08/09/2014   Hypertension 08/09/2014   Past Medical History:  Diagnosis Date   Cataract    removed bilateraly   Cough    Glaucoma    Hypertension    PFO (patent foramen ovale)    PFO noted on coronary CT 09/05/14   Prostate cancer ([place]) 2009   Past Surgical History:  Procedure Laterality Date   CATARACT EXTRACTION     bilateral and a wrinkle on retina on the left    CHOLECYSTECTOMY     COLONOSCOPY     DIRECT LARYNGOSCOPY N/A 12/17/2020   Procedure: DIRECT LARYNGOSCOPY WITH BIOPSIES;  Surgeon: Izora Gala, MD;  Location: [facility];  Service: ENT;  Laterality: N/A;   ESOPHAGOSCOPY N/A 12/17/2020  Procedure: ESOPHAGOSCOPY;  Surgeon: Izora Gala, MD;  Location: [facility];  Service: ENT;  Laterality: N/A;   PROSTATE SURGERY  2009   radiation treatment     RADICAL NECK DISSECTION Left 12/17/2020   Procedure: LEFT NECK DISSECTION;  Surgeon: Izora Gala, MD;  Location: [facility];  Service: ENT;  Laterality: Left;   RESECTION OF MEDIASTINAL MASS N/A 02/26/2015   Procedure: RESECTION OF MEDIASTINAL MASS;  Surgeon: Ivin Poot, MD;  Location: [facility];  Service: Thoracic;   Laterality: N/A;   STERNOTOMY N/A 02/26/2015   Procedure: STERNOTOMY;  Surgeon: Ivin Poot, MD;  Location: [facility];  Service: Thoracic;  Laterality: N/A;   UPPER GASTROINTESTINAL ENDOSCOPY     No Known Allergies Current Outpatient Medications  Medication Sig Dispense Refill   aspirin 81 MG EC tablet Take 162 mg by mouth at bedtime.     calcium-vitamin D (CALCIUM 500/D) 500-200 MG-UNIT tablet Take 1 tablet by mouth at bedtime.     diltiazem (CARDIZEM CD) 120 MG 24 hr capsule Take 240 mg by mouth at bedtime.     Flaxseed, Linseed, (FLAXSEED OIL) 1200 MG CAPS Take 2,400 mg by mouth at bedtime.     HYDROcodone-acetaminophen (NORCO) 7.5-325 MG tablet Take 1 tablet by mouth every 6 (six) hours as needed for moderate pain. 20 tablet 0   latanoprost (XALATAN) 0.005 % ophthalmic solution Place 1 drop into both eyes at bedtime.     Multiple Vitamin (MULTIVITAMIN) tablet Take 1 tablet by mouth at bedtime.     niacin 500 MG CR capsule Take 500 mg by mouth at bedtime.     omeprazole (PRILOSEC) 40 MG capsule TAKE 1 CAPSULE BY MOUTH EVERY DAY 90 capsule 3   ondansetron (ZOFRAN ODT) 8 MG disintegrating tablet Take 1 tablet (8 mg total) by mouth every 8 (eight) hours as needed for nausea or vomiting. 20 tablet 0   sucralfate (CARAFATE) 1 g tablet TAKE ONE TABLET BY MOUTH TWICE DAILY (Patient taking differently: Take 1 g by mouth at bedtime.) 60 tablet 0   timolol (TIMOPTIC) 0.5 % ophthalmic solution Place 1 drop into both eyes daily.     Vitamin D, Cholecalciferol, 50 MCG (2000 UT) CAPS Take 2,000 Units by mouth at bedtime.     No current facility-administered medications for this visit.    LABS: Lab Results  Component Value Date   WBC 5.6 12/13/2020   HGB 15.1 12/13/2020   HCT 45.0 12/13/2020   MCV 90.0 12/13/2020   PLT 302 12/13/2020      Component Value Date/Time   NA 139 12/13/2020 1430   K 4.0 12/13/2020 1430   CL 107 12/13/2020 1430   CO2 25 12/13/2020 1430   GLUCOSE 76 12/13/2020  1430   BUN 10 12/13/2020 1430   CREATININE 1.10 12/13/2020 1430   CREATININE 1.10 02/13/2015 1545   CALCIUM 8.9 12/13/2020 1430   GFRNONAA >60 12/13/2020 1430   GFRAA >60 02/28/2015 0236   Lab Results  Component Value Date   INR 0.9 10/05/2020   INR 1.07 02/22/2015   No results found for: PTT  Social History   Socioeconomic History   Marital status: Married    Spouse name: Not on file   Number of children: Not on file   Years of education: Not on file   Highest education level: Not on file  Occupational History   Not on file  Tobacco Use   Smoking status: Never    Passive exposure: Past  Smokeless tobacco: Never  Vaping Use   Vaping Use: Never used  Substance and Sexual Activity   Alcohol use: No   Drug use: No   Sexual activity: Not Currently  Other Topics Concern   Not on file  Social History Narrative   Not on file   Social Determinants of Health   Financial Resource Strain: Not on file  Food Insecurity: Not on file  Transportation Needs: Not on file  Physical Activity: Not on file  Stress: Not on file  Social Connections: Not on file  Intimate Partner Violence: Not on file   Family History  Problem Relation Age of Onset   Heart disease Brother    COPD Brother    Cancer Brother        liver   Liver cancer Brother    Heart disease Mother        MI   COPD Mother    Pneumonia Mother    Diabetes Mother    COPD Father    Colon polyps Other    Esophageal cancer Neg Hx    Rectal cancer Neg Hx    Stomach cancer Neg Hx     REVIEW OF SYSTEMS:  Reviewed with the patient as per HPI. Psych:  Patient denies having dental phobia.   VITAL SIGNS: BP 114/75 (BP Location: Right Arm, Patient Position: Sitting, Cuff Size: Normal)   Pulse 68   Temp 98.2 F (36.8 C) (Oral)    PHYSICAL EXAM: General:  Well-developed, comfortable and in no apparent distress. Neurological:  Alert and oriented to person, place and  time. Extraoral:  Facial symmetry present  without any edema or erythema.  No swelling.  TMJ asymptomatic without clicks or crepitations.      Maximum Interincisal Opening:  41 mm Intraoral:  Soft tissues appear well-perfused and mucous membranes moist.  FOM and vestibules soft and not raised. Oral cavity without mass or lesion. No signs of infection, parulis, sinus tract, edema or erythema evident upon exam.   DENTAL EXAM: Hard tissue exam completed and charted.    Overall impression:  Good remaining dentition.    Oral hygiene:  Fair   Periodontal:  Pink, healthy gingival tissue with blunted papilla.  Localized plaque.  Localized gingival recession. Endodontics:   #3, #14 and #19 have been RCT Removable/fixed prosthodontics:  #2 and #3 have gold crowns, #14 and #19 have PFM crowns Occlusion:  Class I molar occlusion on R side. (+) Non-functional teeth:  #1, #2, #20 (+) Supra-erupted teeth:  #1, #2, #20 Other findings:   (+) Attrition/wear:  #7-#10 incisal, #23-#26 incisal (+) Diastema(s):  #22#23   RADIOGRAPHIC EXAM:  PAN and Full Mouth Series exposed and interpreted.  Condyles seated bilaterally in fossas.  No evidence of abnormal pathology.  All visualized osseous structures appear WNL. #1, #2, #20 appear supra-erupted. #1 and #20 are distally inclined and #30 mesially inclined. Radiopaque area noted on the L side just lateral to the angle of the mandible consistent with the patient's recent surgery/neck dissection.  Localized mild horizontal bone loss consistent with mild periodontitis vs recession on healthy periodontium.  Missing teeth, existing restorations and crown/bridge work.  #3, #14, #19 have been previously endodontically treated with gutta percha appearing ~2 mm short of apices in #3 and #19, and ~3 mm short of apices in #14.  #14MB root shows possible signs of external root resorption with faint PDL space towards apex.   ASSESSMENT:  1.  Metastatic cancer to cervical lymph nodes  2.  Pre-radiation dental  exam 3.  Missing teeth 4.  Gingival recession, localized 5.  Plaque-induced gingivitis 6.  Attrition/wear 7.  Diastema(s)   PROCEDURES: The common and significant side effects of radiation therapy to the head and neck were explained and discussed with the patient.  The discussion included side effects of trismus (limited opening), dysgeusia (loss of taste), xerostomia (dry mouth), radiation caries and osteoradionecrosis of the jaw.  I also discussed the importance of maintaining optimal oral hygiene and oral health before, during and after radiation to decrease the risk of developing radiation cavities and the need for any surgery such as extractions after therapy.    Trismus appliance made using patient's baseline MIO (24 sticks).  Leta Speller, DAII demonstrated use of appliance.  Verbal and written postop instructions were given to the patient.   PLAN AND RECOMMENDATIONS: I discussed the risks, benefits, and complications of various scenarios with the patient in relationship to their medical and dental conditions, which included systemic infection or other serious issues such as osteoradionecrosis that could potentially occur either before, during or after their anticipated radiation therapy if dental/oral concerns are not addressed.  I explained that if any chronic or acute dental/oral infection(s) are addressed and subsequently not maintained following medical optimization and recovery, their risk of the previously mentioned complications are just as high and could potentially occur postoperatively.  I explained all significant findings of the dental consultation with the patient including #1 which is a 3rd molar and drifting towards the back of his mouth which is a plaque-trap and could cause periodontal issues in the future, and the recommended care including maintaining optimal oral hygiene and continuing to see his primary dentist every 6 months for cleanings and exams in order to optimize  them following radiation from a dental standpoint.  The patient verbalized understanding of all findings, discussion, and recommendations. We then discussed various treatment options to include no treatment, multiple extractions with alveoloplasty, pre-prosthetic surgery as indicated, periodontal therapy, dental restorations, root canal therapy, crown and bridge therapy, implant therapy, and replacement of missing teeth as indicated.  The patient verbalized understanding of all options, and currently wishes to proceed with returning to his regular dental office after he finishes radiation as well as returning to our clinic once more for a follow-up visit after he finishes therapy.  Follow-up appointment was scheduled today. Plan to discuss all findings and recommendations with medical team and coordinate future care as needed.  All questions and concerns were invited and addressed.  The patient tolerated today's visit well and departed in stable condition.  I spent in excess of 120 minutes during the conduct of this consultation and >50% of this time involved direct face-to-face encounter for counseling and/or coordination of the patient's care.         [place] Benson [place], D.M.D.

## 2021-01-08 NOTE — Patient Instructions (Signed)
[place] [facility] Department of Dental Medicine Khamryn Calderone B. Karla Vines, D.M.D. Phone: [phone] Fax: [phone]   It was a pleasure seeing you today!  Please refer to the information below regarding your dental visit with us.  Call if you have any questions or concerns that come up after you leave.   Thank you for letting us provide care for you.  If there is anything we can do for you, please let us know.    RADIATION THERAPY AND INFORMATION REGARDING YOUR TEETH   XEROSTOMIA (DRY MOUTH):  Your salivary glands may be in the field of radiation.  Radiation may include all or only part of your salivary glands.  This will cause your saliva to dry up, and you will have a dry mouth.  The dry mouth will be for the rest of your life unless your radiation oncologist tells you otherwise.  Your saliva has many functions: It wets your tongue for speaking. It coats your teeth and the inside of your mouth for easier movement. It helps with chewing and swallowing food. It helps clean away harmful acid and toxic products made by the germs in your mouth, therefore it helps prevent cavities. It kills some germs in your mouth and helps to prevent gum disease. It helps to carry flavor to your taste buds.  Once you have lost your saliva, you will be at higher risk for tooth decay and gum disease.    What can be done to help improve your mouth when there's not enough saliva? Your dentist may give a recommendation for CLoSYS.  It will not bring back all of your saliva but may bring back some of it.  Also, your saliva may be thick and ropy or white and foamy.  It will not feel like it use to feel. You will need to swish with water every time your mouth feels dry.  YOU CANNOT suck on any cough drops, mints, lemon drops, candy, vitamin C or any other products.  You cannot use anything other than water to make your mouth feel less dry.  If you want to drink anything else, you have to drink it all at once and brush  afterwards.  Be sure to discuss the details of your diet habits with your dentist or hygienist.   RADIATION CARIES:  This is decay (cavities) that happens very quickly once your mouth is very dry due to radiation therapy.  Normally, cavities take six months to two years to become a problem.  When you have dry mouth, cavities may take as little as eight weeks to cause you a problem.    Dental check-ups every two months are necessary as long as you have a dry mouth. Radiation caries typically, but not always, start at your gum line where it is hard to see the cavity.  It is therefore also hard to fill these cavities adequately.  This high rate of cavities happens because your mouth no longer has saliva and therefore the acid made by the germs starts the decay process.  Whenever you eat anything the germs in your mouth change the food into acid.  The acid then burns a small hole in your tooth.  This small hole is the beginning of a cavity.  If this is not treated then it will grow bigger and become a cavity.  The way to avoid this hole getting bigger is to use fluoride every evening as prescribed by your dentist following your radiation. NOTE:  You have to make sure   that your teeth are very clean before you use the fluoride.  This fluoride in turn will strengthen your teeth and prepare them for another day of fighting acid. If you develop radiation caries many times, the damage is so large that you will have to have all your teeth removed.  This could be a big problem if some of these teeth are in the field of radiation.  Further details of why this could be a big problem will follow (see Osteoradionecrosis below).   DYSGEUSIA (LOSS OF TASTE):  This happens to varying degrees once you've had radiation therapy to your jaw region.  Many times taste is not completely lost, but becomes limited.  The loss of taste is mostly due to radiation affecting your taste buds.  However, if you have no saliva in your mouth  to carry the flavor to your taste buds, it would be difficult for your taste buds to taste anything.  That is why using water or a prescription for Sal[place] prior to meals and during meal times may help with some of the taste.  Keep in mind that taste generally returns very slowly over the course of several months or several years after radiation therapy.  Don't give up hope.   TRISMUS (LIMITED JAW OPENING):  According to your radiation oncologist, your TMJ or jaw joints are going to be partially or fully in the field of radiation.  This means that over time the muscles that help you open and close your mouth may get stiff.  This will potentially result in your not being able to open your mouth wide enough or as wide as you can open it now.    Let me give you an example of how slowly this happens and how unaware people are of it:   A gentlemen that had radiation therapy two years ago came back to me complaining that bananas are just too large for him to be able to fit them in between his teeth.  He was not able to open wide enough to bite into a banana.  This happens slowly and over a period of time.  What we do to try and prevent this:   Your dentist will probably give you a stack of sticks called a trismus exercise device.  This stack will help remind your muscles and your jaw joints to open up to the same distance every day.  Use these sticks every morning when you wake up, or according to the instructions given by your dentist.    You must use these sticks for at least one to two years after radiation therapy.  The reason for that is because it happens so slowly and keeps going on for about two years after radiation therapy.  Your hospital dentist will help you monitor your mouth opening and make sure that it's not getting smaller after radiation.  TRISMUS EXERCISES: Using the stack of sticks given to you by your dentist, place the stack in your mouth and hold onto the other end for support. Leave  the sticks in your mouth while holding the other end.  Allow 30 seconds for muscle stretching. Rest for a few seconds. Repeat 3-5 times. This exercise is recommended in the mornings and evenings unless otherwise instructed. The exercise should be done for a period of 2 YEARS after the end of radiation. Your maximum jaw opening should be checked regularly at recall dental visits by your general dentist. You should report any changes, soreness, or difficulties encountered   when doing the exercises to your dentist.   OSTEORADIONECROSIS (ORN):  This is a condition where your jaw bone after radiation therapy becomes very dry.  It has very little blood supply to keep it alive.  If you develop a cavity that turns into an abscess or an infection, then the jaw bone does not have enough blood supply to help fight the infection.  At this point it is very likely that the infection could cause the death of your jaw bone.  When you have dead bone it has to be removed.  Therefore, you might end up having to have surgery to remove part of your jaw bone, the part of the jaw bone that has been affected.     Healing is also a problem if you are to have surgery (like a tooth extraction) in the areas where the bone has had radiation therapy.  If you have surgery, you need more blood supply to heal which is not available.  When blood supply and oxygen are not available, there is a chance for the bone to die. Occasionally, ORN happens on its own with no obvious reason, but this is quite rare.  We believe that patients who continue to smoke and/or drink alcohol have a higher chance of having this problem. Once your jaw bone has had radiation therapy, if there are any remaining teeth in that area, it is not recommended to have them pulled unless your dentist or oral surgeon is aware of your history of radiation and believes it is safe.  The risks for ORN either from infection or spontaneously occurring (with no reason) are life  long.   QUESTIONS? Call our office during office hours at [phone].  

## 2021-01-09 LAB — SURGICAL PATHOLOGY

## 2021-01-10 ENCOUNTER — Inpatient Hospital Stay: Payer: PPO | Attending: Oncology | Admitting: Oncology

## 2021-01-10 ENCOUNTER — Other Ambulatory Visit: Payer: Self-pay

## 2021-01-10 VITALS — BP 131/68 | HR 62 | Temp 98.2°F | Resp 17 | Ht 67.0 in | Wt 203.1 lb

## 2021-01-10 DIAGNOSIS — C801 Malignant (primary) neoplasm, unspecified: Secondary | ICD-10-CM | POA: Insufficient documentation

## 2021-01-10 DIAGNOSIS — C760 Malignant neoplasm of head, face and neck: Secondary | ICD-10-CM | POA: Diagnosis not present

## 2021-01-10 DIAGNOSIS — Z7982 Long term (current) use of aspirin: Secondary | ICD-10-CM | POA: Insufficient documentation

## 2021-01-10 DIAGNOSIS — C770 Secondary and unspecified malignant neoplasm of lymph nodes of head, face and neck: Secondary | ICD-10-CM | POA: Diagnosis not present

## 2021-01-10 DIAGNOSIS — Z8546 Personal history of malignant neoplasm of prostate: Secondary | ICD-10-CM | POA: Insufficient documentation

## 2021-01-10 DIAGNOSIS — Z79899 Other long term (current) drug therapy: Secondary | ICD-10-CM | POA: Insufficient documentation

## 2021-01-10 MED ORDER — PROCHLORPERAZINE MALEATE 10 MG PO TABS: 10.0000 mg | ORAL_TABLET | Freq: Four times a day (QID) | ORAL | 0 refills | Status: DC | PRN

## 2021-01-10 MED ORDER — LIDOCAINE-PRILOCAINE 2.5-2.5 % EX CREA: 1.0000 "application " | TOPICAL_CREAM | CUTANEOUS | 0 refills | Status: DC | PRN

## 2021-01-10 NOTE — Progress Notes (Signed)
START ON PATHWAY REGIMEN - Head and Neck     A cycle is every 7 days:     Cisplatin   **Always confirm dose/schedule in your pharmacy ordering system**  Patient Characteristics: Hypopharynx, Postoperative without Neoadjuvant Therapy (Pathologic Staging), High Risk Disease Classification: Hypopharynx Therapeutic Status: Postoperative without Neoadjuvant Therapy (Pathologic Staging) AJCC T Category: pTX Risk Status: High Risk AJCC N Category: pN1 AJCC M Category: cM0 AJCC 8 Stage Grouping: Unknown Intent of Therapy: Curative Intent, Discussed with Patient

## 2021-01-10 NOTE — Progress Notes (Signed)
Hematology and Oncology Follow Up Visit  Nicholas Daniels [id number] 1951/03/14 70 y.o. 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  He was found to have p16 positive N1 disease after left neck dissection.   Secondary diagnosis: T3N0 Gleason score 3+4 = 7 prostate cancer diagnosed 2009.  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  Prior Therapy:  He underwent prostatectomy and found to have a T3a N0 Gleason score 3+4 = 7 cancer.  He underwent salvage radiation in 2012.   He developed a rise in his PSA and enrolled in the [facility] trial.   He was off enzalutamide since August 2021 and off clinical trial at that time.  His PSA started to rise in the last year with a PSA in May 2022 was 0.56.  Previously was 0.35 and it was undetectable in May 2021.  Prior to enzalutamide his PSA was as high as 8.27 and declined to undetectable level.  He is status post direct laryngoscopy and left neck dissection completed by Dr. Constance Holster on December 17, 2020.  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   Current therapy: Under evaluation for adjuvant radiation with chemotherapy.  Interim History: [name] returns today for a follow-up visit.  Since the last visit, he underwent left neck dissection completed by Dr. Constance Holster on September 12.  He recovered from the procedure reasonably well although does report some soreness around that area.  He denies any shortness of breath or difficulty breathing.  He denies any residual complaints.     Medications: I have reviewed the patient's current medications.  Current Outpatient Medications  Medication Sig Dispense Refill   aspirin 81 MG EC tablet Take 162 mg by mouth at bedtime.     calcium-vitamin D (CALCIUM  500/D) 500-200 MG-UNIT tablet Take 1 tablet by mouth at bedtime.     diltiazem (CARDIZEM CD) 120 MG 24 hr capsule Take 240 mg by mouth at bedtime.     Flaxseed, Linseed, (FLAXSEED OIL) 1200 MG CAPS Take 2,400 mg by mouth at bedtime.     HYDROcodone-acetaminophen (NORCO) 7.5-325 MG tablet Take 1 tablet by mouth every 6 (six) hours as needed for moderate pain. 20 tablet 0   latanoprost (XALATAN) 0.005 % ophthalmic solution Place 1 drop into both eyes at bedtime.     Multiple Vitamin (MULTIVITAMIN) tablet Take 1 tablet by mouth at bedtime.     niacin 500 MG CR capsule Take 500 mg by mouth at bedtime.     omeprazole (PRILOSEC) 40 MG capsule TAKE 1 CAPSULE BY MOUTH EVERY DAY 90 capsule 3   ondansetron (ZOFRAN ODT) 8 MG disintegrating tablet Take 1 tablet (8 mg total) by mouth every 8 (eight) hours as needed for nausea or vomiting. 20 tablet 0   sucralfate (CARAFATE) 1 g tablet TAKE ONE TABLET BY MOUTH TWICE DAILY (Patient taking differently: Take 1 g by mouth at bedtime.) 60 tablet 0   timolol (TIMOPTIC) 0.5 % ophthalmic solution Place 1 drop into both eyes daily.     Vitamin D, Cholecalciferol, 50 MCG (2000 UT) CAPS Take 2,000 Units by mouth at bedtime.     No current facility-administered medications for this visit.     Allergies: No Known Allergies   Physical Exam: Blood pressure 131/68, pulse 62, temperature 98.2 F (36.8  C), temperature source Oral, resp. rate 17, height 5\' 7"  (1.702 m), weight 203 lb 1.6 oz (92.1 kg), SpO2 96 %. ECOG: 1 General appearance: alert and cooperative appeared without distress. Head: Normocephalic, without obvious abnormality Oropharynx: No oral thrush or ulcers. Eyes: No scleral icterus.  Pupils are equal and round reactive to light. Lymph nodes: Cervical, supraclavicular, and axillary nodes normal.  Well-healed scar noted on his left cervical area. Heart:regular rate and rhythm, S1, S2 normal, no murmur, click, rub or gallop Lung:chest clear, no wheezing,  rales, normal symmetric air entry Abdomin: soft, non-tender, without masses or organomegaly. Neurological: No motor, sensory deficits.  Intact deep tendon reflexes. Skin: No rashes or lesions.  No ecchymosis or petechiae. Musculoskeletal: No joint deformity or effusion.     Lab Results: Lab Results  Component Value Date   WBC 5.6 12/13/2020   HGB 15.1 12/13/2020   HCT 45.0 12/13/2020   MCV 90.0 12/13/2020   PLT 302 12/13/2020     Chemistry      Component Value Date/Time   NA 139 12/13/2020 1430   K 4.0 12/13/2020 1430   CL 107 12/13/2020 1430   CO2 25 12/13/2020 1430   BUN 10 12/13/2020 1430   CREATININE 1.10 12/13/2020 1430   CREATININE 1.10 02/13/2015 1545      Component Value Date/Time   CALCIUM 8.9 12/13/2020 1430   ALKPHOS 22 (L) 02/28/2015 0236   AST 38 02/28/2015 0236   ALT 72 (H) 02/28/2015 0236   BILITOT 1.9 (H) 02/28/2015 0236       Radiological Studies:  IMPRESSION: 1. Metabolic activity in the LEFT neck without measurable adenopathy is considered benign postsurgical inflammation. 2. No hypermetabolic or enlarged RIGHT cervical lymph nodes. 3. No abnormal or asymmetric hypermetabolic activity in the hypopharynx or oropharynx. 4. No evidence distant metastatic disease    Impression and Plan:  Assessment and Plan:      70 year old with:  1.  Squamous cell carcinoma of the head and neck area without unknown primary presented with cervical lymph node involvement on the left.  He has 2 out of 15 lymph nodes involved with extracapsular extension.   Treatment options moving forward were discussed at this time.  The rationale for adjuvant radiation therapy as well as the addition of chemotherapy were discussed.  Given his 2 positive lymph nodes and extracapsular extension, he does presented with high risk disease. EORTC 82956 and RTOG 9501 showed added benefit of chemoradiation to radiation alone in this setting.  Complication associated with adding  cisplatin chemotherapy on a weekly basis with radiation were discussed.  These would include nausea, vomiting, myelosuppression, neutropenia, possible sepsis, electrolyte imbalance including hypokalemia and rarely SIADH.  After discussion today, he is agreeable to proceed and we will arrange for chemotherapy education class.    2.  Prostate cancer: Currently experiencing biochemical relapse without any evidence of measurable disease based on PSMA PET scan in May 2022.  Additional therapy will be on hold at the conclusion of the treatment of other malignancy.  3.  IV access: Port-A-Cath will be inserted in the near future.  Complications: Bleeding, thrombosis and infection were reviewed.  He is agreeable to proceed.  4.  Nutritional status: He would be high risk of developing malnutrition and high risk of mucositis.  PEG tube will be also placed in anticipation of this treatment.  5.  Antiemetics: Prescription for Compazine will be available to him.   6.  Follow-up: In the near future to start therapy.  40  minutes were spent on this encounter.  The time was dedicated to reviewing imaging studies, treatment options, complications related to his therapy as well as answering questions about future plan of care regarding both malignancies.      Zola Button, MD 10/6/20223:46 PM

## 2021-01-11 ENCOUNTER — Other Ambulatory Visit: Payer: Self-pay

## 2021-01-11 DIAGNOSIS — C770 Secondary and unspecified malignant neoplasm of lymph nodes of head, face and neck: Secondary | ICD-10-CM

## 2021-01-11 DIAGNOSIS — C760 Malignant neoplasm of head, face and neck: Secondary | ICD-10-CM

## 2021-01-14 ENCOUNTER — Encounter: Payer: Self-pay | Admitting: *Deleted

## 2021-01-14 NOTE — Progress Notes (Signed)
[facility] Work  Initial Assessment   Bakari DEVARIS QUIRK is a 70 y.o. year old male contacted by phone. Clinical Social Work was referred by radiation oncology as anew head and Neck cancer patient for assessment of psychosocial needs.   SDOH (Social Determinants of Health) assessments performed: Yes   Distress Screen completed: Yes ONCBCN DISTRESS SCREENING 01/01/2021  Screening Type Initial Screening  Distress experienced in past week (1-10) 1  Practical problem type Housing;Transportation;Food  Emotional problem type Nervousness/Anxiety;Adjusting to illness  Information Concerns Type Lack of info about diagnosis;Lack of info about treatment  Physical Problem type Pain;Mouth sores/swallowing  Physician notified of physical symptoms Yes  Referral to clinical psychology No  Referral to clinical social work Yes  Referral to dietition Yes  Referral to financial advocate No  Referral to support programs Yes  Referral to palliative care No      Family/Social Information:  Housing Arrangement: patient lives with his wife  Family members/support persons in your life? Family and Church  Patients daughter lives close by and is available to support when needed.  Patients wife also has a sister and brother who with within 30 minutes and can provide support.   Patients wife reported she received a lung transplant 2 years ago, and has many appointments and additional treatments on a regular basis.  Patients wife stated her family has already agreed to provide transportation/support on those upcoming dates.  Transportation concerns: no, but may need [facility] transportation as treatment progresses.  Employment: Retired. Income source: Paediatric nurse concerns: No Type of concern: Medical bills Food access concerns: no Religious or spiritual practice: yes Medication Concerns: yes  Services Currently in place:  Patient did not indicate any current services, but did request  information on potential assistance with medications.   Coping/ Adjustment to diagnosis: Patient understands treatment plan and what happens next? yes Concerns about diagnosis and/or treatment: Overwhelmed by information Patient reported stressors: Transportation and Adjusting to my illness Patient enjoys time with family/ friends Current coping skills/ strengths: Capable of independent living , Armed forces logistics/support/administrative officer , Religious Affiliation , and Supportive family/friends     SUMMARY: Current SDOH Barriers:  None at this time  Clinical Social Work Clinical Goal(s):  patient will follow up with financial advocate regarding the [employer] and prescription/medication assistance* as directed by SW  Interventions: Discussed common feeling and emotions when being diagnosed with cancer, and the importance of support during treatment Informed patient of the support team roles and support services at [facility] Provided CSW contact information and encouraged patient to call with any questions or concerns Provided patient with information about the [facility] transportation program. CSW added patient to the monthly support program mailing list.    Follow Up Plan: Patient will contact CSW with any support or resource needs Patient verbalizes understanding of plan: Yes    [place] , [place], MSW, LCSW, OSW-C Clinical Social Worker [place] [phone]

## 2021-01-15 ENCOUNTER — Other Ambulatory Visit: Payer: Self-pay

## 2021-01-15 ENCOUNTER — Ambulatory Visit
Admission: RE | Admit: 2021-01-15 | Discharge: 2021-01-15 | Disposition: A | Discharge status: Home / Self Care | Payer: PPO | Source: Ambulatory Visit | Attending: Radiation Oncology | Admitting: Radiation Oncology

## 2021-01-15 DIAGNOSIS — Z510 Encounter for antineoplastic radiation therapy: Secondary | ICD-10-CM | POA: Insufficient documentation

## 2021-01-15 DIAGNOSIS — C770 Secondary and unspecified malignant neoplasm of lymph nodes of head, face and neck: Secondary | ICD-10-CM | POA: Insufficient documentation

## 2021-01-15 DIAGNOSIS — C801 Malignant (primary) neoplasm, unspecified: Secondary | ICD-10-CM | POA: Insufficient documentation

## 2021-01-15 NOTE — Progress Notes (Signed)
Oncology Nurse Navigator Documentation   Met with Mr. Salva and his wife, Santiago Glad to provide PEG/port education prior to 01/28/21 placement. Provided port educational handout, showed example, provided guidance for post-surgical dsg removal, site care.  Using  PEG teaching device   and Teach Back, provided education for PEG use and care, including: hand hygiene, gravity bolus administration of daily water flushes and nutritional supplement, fluids and medications; care of tube insertion site including daily dressing change and cleaning; S&S of infection.   Mr. Hassell correctly verbalized procedures for and provided correct return demonstration of gravity administration of water, dressing change and site care.  I provided written instructions for PEG flushing/dressing change in support of verbal instruction.   I provided/described contents of Start of Care Bolus Feeding Kit (3 60 cc syringes, 2 boxes 4x4 drainage sponges, 1 package mesh briefs, 1 roll paper tape, 1 case Osmolite 1.5).  He voiced understanding he is to start using Osmolite per guidance of Nutrition. He understands I will be available for ongoing PEG support. Provided barium sulfate prep which I obtained from [facility] and reviewed instructions.  Harlow Asa RN, BSN, OCN Head & Neck Oncology Nurse [place] at [facility] Phone # [phone]  Fax # [phone]

## 2021-01-17 ENCOUNTER — Encounter (INDEPENDENT_AMBULATORY_CARE_PROVIDER_SITE_OTHER): Payer: PPO | Admitting: Ophthalmology

## 2021-01-17 ENCOUNTER — Encounter (INDEPENDENT_AMBULATORY_CARE_PROVIDER_SITE_OTHER): Payer: Self-pay | Admitting: Ophthalmology

## 2021-01-17 ENCOUNTER — Ambulatory Visit (INDEPENDENT_AMBULATORY_CARE_PROVIDER_SITE_OTHER): Payer: PPO | Admitting: Ophthalmology

## 2021-01-17 ENCOUNTER — Other Ambulatory Visit: Payer: Self-pay

## 2021-01-17 DIAGNOSIS — H33102 Unspecified retinoschisis, left eye: Secondary | ICD-10-CM | POA: Diagnosis not present

## 2021-01-17 DIAGNOSIS — H35372 Puckering of macula, left eye: Secondary | ICD-10-CM | POA: Diagnosis not present

## 2021-01-17 DIAGNOSIS — H35371 Puckering of macula, right eye: Secondary | ICD-10-CM

## 2021-01-17 NOTE — Assessment & Plan Note (Signed)
Visual acuity impact due to photoreceptor layer disturbance with nodular change in the foveal region accounting for his symptomatic change in vision left eye.  Will need vitrectomy membrane peel, ILM, however we will delay this until his systemic therapy for head and neck cancer has been completed anticipated in December 2022

## 2021-01-17 NOTE — Progress Notes (Signed)
01/17/2021     CHIEF COMPLAINT Patient presents for  Chief Complaint  Patient presents with   Retina Follow Up      HISTORY OF PRESENT ILLNESS: Nicholas Daniels is a 70 y.o. male who presents to the clinic today for:   HPI     Retina Follow Up   Patient presents with  Other.  In left eye.  This started 5 weeks ago.  Severity is mild.  Duration of 5 weeks.  Since onset it is stable.        Comments   5 week fu os and oct Pt states, "Since I was here I was found to have cancer in the lymph nodes, squamous cell carcinoma, in my neck and they have been removed. Next week I will start radiation and chemo for treatment. Nothing new is happening with my vision that I can tell. I have not had any change."  Still the left eye does not see as well as the right Pt report still using Latanoprost QHS and Timolol BID OU       Last edited by Hurman Horn, MD on 01/17/2021  8:59 AM.      Referring physician: Leanna Battles, MD [place],  [place] [id number]  HISTORICAL INFORMATION:   Selected notes from the MEDICAL RECORD NUMBER       CURRENT MEDICATIONS: Current Outpatient Medications (Ophthalmic Drugs)  Medication Sig   latanoprost (XALATAN) 0.005 % ophthalmic solution Place 1 drop into both eyes at bedtime.   timolol (TIMOPTIC) 0.5 % ophthalmic solution Place 1 drop into both eyes daily.   No current facility-administered medications for this visit. (Ophthalmic Drugs)   Current Outpatient Medications (Other)  Medication Sig   aspirin 81 MG EC tablet Take 162 mg by mouth at bedtime.   calcium-vitamin D (CALCIUM 500/D) 500-200 MG-UNIT tablet Take 1 tablet by mouth at bedtime.   diltiazem (CARDIZEM CD) 120 MG 24 hr capsule Take 240 mg by mouth at bedtime.   Flaxseed, Linseed, (FLAXSEED OIL) 1200 MG CAPS Take 2,400 mg by mouth at bedtime.   HYDROcodone-acetaminophen (NORCO) 7.5-325 MG tablet Take 1 tablet by mouth every 6 (six) hours as needed for moderate  pain.   lidocaine-prilocaine (EMLA) cream Apply 1 application topically as needed.   Multiple Vitamin (MULTIVITAMIN) tablet Take 1 tablet by mouth at bedtime.   niacin 500 MG CR capsule Take 500 mg by mouth at bedtime.   omeprazole (PRILOSEC) 40 MG capsule TAKE 1 CAPSULE BY MOUTH EVERY DAY   ondansetron (ZOFRAN ODT) 8 MG disintegrating tablet Take 1 tablet (8 mg total) by mouth every 8 (eight) hours as needed for nausea or vomiting.   prochlorperazine (COMPAZINE) 10 MG tablet Take 1 tablet (10 mg total) by mouth every 6 (six) hours as needed for nausea or vomiting.   sucralfate (CARAFATE) 1 g tablet TAKE ONE TABLET BY MOUTH TWICE DAILY (Patient taking differently: Take 1 g by mouth at bedtime.)   Vitamin D, Cholecalciferol, 50 MCG (2000 UT) CAPS Take 2,000 Units by mouth at bedtime.   No current facility-administered medications for this visit. (Other)      REVIEW OF SYSTEMS:    ALLERGIES No Known Allergies  PAST MEDICAL HISTORY Past Medical History:  Diagnosis Date   Cataract    removed bilateraly   Cough    Glaucoma    Hypertension    PFO (patent foramen ovale)    PFO noted on coronary CT 09/05/14   Prostate  cancer ([facility]) 2009   Past Surgical History:  Procedure Laterality Date   CATARACT EXTRACTION     bilateral and a wrinkle on retina on the left    CHOLECYSTECTOMY     COLONOSCOPY     DIRECT LARYNGOSCOPY N/A 12/17/2020   Procedure: DIRECT LARYNGOSCOPY WITH BIOPSIES;  Surgeon: Izora Gala, MD;  Location: [facility] OR;  Service: ENT;  Laterality: N/A;   ESOPHAGOSCOPY N/A 12/17/2020   Procedure: ESOPHAGOSCOPY;  Surgeon: Izora Gala, MD;  Location: [facility] OR;  Service: ENT;  Laterality: N/A;   PROSTATE SURGERY  2009   radiation treatment     RADICAL NECK DISSECTION Left 12/17/2020   Procedure: LEFT NECK DISSECTION;  Surgeon: Izora Gala, MD;  Location: [facility] OR;  Service: ENT;  Laterality: Left;   RESECTION OF MEDIASTINAL MASS N/A 02/26/2015   Procedure: RESECTION OF MEDIASTINAL  MASS;  Surgeon: Ivin Poot, MD;  Location: [facility] OR;  Service: Thoracic;  Laterality: N/A;   STERNOTOMY N/A 02/26/2015   Procedure: STERNOTOMY;  Surgeon: Ivin Poot, MD;  Location: [facility] OR;  Service: Thoracic;  Laterality: N/A;   UPPER GASTROINTESTINAL ENDOSCOPY      FAMILY HISTORY Family History  Problem Relation Age of Onset   Heart disease Brother    COPD Brother    Cancer Brother        liver   Liver cancer Brother    Heart disease Mother        MI   COPD Mother    Pneumonia Mother    Diabetes Mother    COPD Father    Colon polyps Other    Esophageal cancer Neg Hx    Rectal cancer Neg Hx    Stomach cancer Neg Hx     SOCIAL HISTORY Social History   Tobacco Use   Smoking status: Never    Passive exposure: Past   Smokeless tobacco: Never  Vaping Use   Vaping Use: Never used  Substance Use Topics   Alcohol use: No   Drug use: No         OPHTHALMIC EXAM:  Base Eye Exam     Visual Acuity (ETDRS)       Right Left   Dist cc 20/20 -2 20/20 -1    Correction: Glasses         Tonometry (Tonopen, 8:53 AM)       Right Left   Pressure 11 11         Pupils       Pupils Dark Light Shape React APD   Right PERRL 4 3 Round Brisk None   Left PERRL 3 2 Round Brisk None         Visual Fields (Counting fingers)       Left Right    Full Full         Extraocular Movement       Right Left    Full Full         Neuro/Psych     Oriented x3: Yes   Mood/Affect: Normal         Dilation     Left eye: 1.0% Mydriacyl, 2.5% Phenylephrine @ 8:53 AM           Slit Lamp and Fundus Exam     External Exam       Right Left   External Normal Normal         Slit Lamp Exam       Right Left   Lids/Lashes  Normal Normal   Conjunctiva/Sclera White and quiet White and quiet, no signs of conjunctivitis   Cornea Clear Fine SPK noted on the ocular surface of the left eye.     Anterior Chamber Deep and quiet Deep and quiet   Iris Round and  reactive Round and reactive   Lens Posterior chamber intraocular lens Posterior chamber intraocular lens   Anterior Vitreous Normal Normal         Fundus Exam       Right Left   Posterior Vitreous Normal Normal   Disc Normal Normal   C/D Ratio 0.25 0.3   Macula Normal, none foveal epiretinal membrane mild Epiretinal membrane, severe topographic distortion with pseudo CME   Vessels Normal Normal   Periphery Normal Normal            IMAGING AND PROCEDURES  Imaging and Procedures for 01/17/21  OCT, Retina - OU - Both Eyes       Right Eye Scan locations included subfoveal. Central Foveal Thickness: 285. Progression has been stable. Findings include epiretinal membrane.   Left Eye Quality was good. Scan locations included subfoveal. Central Foveal Thickness: 460. Progression has been stable. Findings include epiretinal membrane.   Notes Minor epiretinal membrane with no topographic distortion OD, nasal and superior   OS with overall stable appearance and retinal thickening of the left eye from epiretinal membrane, what is new and now slightly progressed however is a small outer retinal nodule of subretinal debris, this could be a change in the photoreceptor layer.  With increased thickening, consider surgical undertaken left eye once patient's systemic issues, cancer therapy are completed             ASSESSMENT/PLAN:  Right epiretinal membrane OD Minor, no impact on acuity will observe  Macular retinoschisis, left Secondary to epiretinal membrane with minor progression as compared to 2 months previous  Left epiretinal membrane Visual acuity impact due to photoreceptor layer disturbance with nodular change in the foveal region accounting for his symptomatic change in vision left eye.  Will need vitrectomy membrane peel, ILM, however we will delay this until his systemic therapy for head and neck cancer has been completed anticipated in December 2022     ICD-10-CM    1. Left epiretinal membrane  H35.372 OCT, Retina - OU - Both Eyes    2. Right epiretinal membrane  H35.371     3. Macular retinoschisis, left  H33.102       1.  OS, will need vitrectomy membrane peel ILM, once systemic therapy radiation and chemo, are completed for his head and neck cancer  2.  I explained to the patient there is no long-term detriment to the potential visual improvement or outcome by delaying our examination for another 4 months and planning thereafter once he has completed therapy and feels better  3.  Ophthalmic Meds Ordered this visit:  No orders of the defined types were placed in this encounter.      Return in about 4 months (around 05/20/2021) for DILATE OU, dilate, OCT.  There are no Patient Instructions on file for this visit.   Explained the diagnoses, plan, and follow up with the patient and they expressed understanding.  Patient expressed understanding of the importance of proper follow up care.   Clent Demark Amandajo Gonder M.D. Diseases & Surgery of the Retina and Vitreous Retina & Diabetic [place] 01/17/21     Abbreviations: M myopia (nearsighted); A astigmatism; H hyperopia (farsighted); P presbyopia; Mrx spectacle prescription;  CTL contact lenses; OD right eye; OS left eye; OU both eyes  XT exotropia; ET esotropia; PEK punctate epithelial keratitis; PEE punctate epithelial erosions; DES dry eye syndrome; MGD meibomian gland dysfunction; ATs artificial tears; PFAT's preservative free artificial tears; [place] nuclear sclerotic cataract; PSC posterior subcapsular cataract; ERM epi-retinal membrane; PVD posterior vitreous detachment; RD retinal detachment; DM diabetes mellitus; DR diabetic retinopathy; NPDR non-proliferative diabetic retinopathy; PDR proliferative diabetic retinopathy; CSME clinically significant macular edema; DME diabetic macular edema; dbh dot blot hemorrhages; CWS cotton wool spot; POAG primary open angle glaucoma; C/D cup-to-disc ratio; HVF  humphrey visual field; GVF goldmann visual field; OCT optical coherence tomography; IOP intraocular pressure; BRVO Branch retinal vein occlusion; CRVO central retinal vein occlusion; CRAO central retinal artery occlusion; BRAO branch retinal artery occlusion; RT retinal tear; SB scleral buckle; PPV pars plana vitrectomy; VH Vitreous hemorrhage; PRP panretinal laser photocoagulation; IVK intravitreal kenalog; VMT vitreomacular traction; MH Macular hole;  NVD neovascularization of the disc; NVE neovascularization elsewhere; AREDS age related eye disease study; ARMD age related macular degeneration; POAG primary open angle glaucoma; EBMD epithelial/anterior basement membrane dystrophy; ACIOL anterior chamber intraocular lens; IOL intraocular lens; PCIOL posterior chamber intraocular lens; Phaco/IOL phacoemulsification with intraocular lens placement; [place] photorefractive keratectomy; LASIK laser assisted in situ keratomileusis; HTN hypertension; DM diabetes mellitus; COPD chronic obstructive pulmonary disease

## 2021-01-17 NOTE — Assessment & Plan Note (Signed)
Secondary to epiretinal membrane with minor progression as compared to 2 months previous

## 2021-01-17 NOTE — Assessment & Plan Note (Signed)
OD Minor, no impact on acuity will observe

## 2021-01-18 ENCOUNTER — Inpatient Hospital Stay: Payer: PPO | Admitting: Dietician

## 2021-01-18 ENCOUNTER — Inpatient Hospital Stay: Payer: PPO

## 2021-01-18 NOTE — Progress Notes (Signed)
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 He is s/p left neck dissection on 9/12. Plans for concurrent chemoradiation with weekly cisplatin. Patient is scheduled for PEG placement on 10/24.   Past medical history includes HTN, prostate cancer, macular degeneration.   Met with patient and wife during infusion. He reports some lingering tenderness s/p neck dissection. Patient denies swallowing difficulties, reports having a good appetite, eats a variety of foods. Wife reports they like to sleep in and eat 3 meals/day. Recall bacon, eggs, cereal, toast, occasional [place] for breakfast. At lunch he will eat leftovers (hamburger, corn, beans, potatoes), sometimes they will eat [employer], tacos, or [employer] chicken sandwich with tator tots. Dinner is usually meat with veggies, or tv dinner. He drinks caffeine free diet coke. Patient does not drink much water. He denies nutrition impact symptoms.   Nutrition Focused Physical Exam: deferred   Medications: Norco, prilosec, zofran, carafate, calcium 500/D, niacin, compazine   Labs: 9/8 labs reviewed   Anthropometrics:   Height: 5'7" Weight: 92.1 kg UBW: 202 lb 12.8 oz (10/23/20) BMI: 31.81   Estimated Energy Needs  Kcals: 8828-0034 Protein: 108-121 Fluid: 2.5 L  Osmolite 1.5 - 7 cartons split over four feedings/day. Flush tube with 90 ml water before and after each feeding. Drink by mouth or give via tube additional 2 cups fluids/day. This will provide 2485 kcal, 104.3 g protein, 2467 ml total water.   NUTRITION DIAGNOSIS: Predicted suboptimal intake related to cancer and associated treatment side effects as evidenced by pending radiation therapy for head and neck cancer affecting ability to swallow.    INTERVENTION:  Educated on importance of adequate calories and protein to maintain strength/weights, nutrition  Continue eating  regular textures as able Encouraged snacks in between meals with focus on protein  Discussed ways to alter texture for easy to chew/swallow foods - handout provided Discussed importance of oral care, handout provided Recommended baking soda, salt water rinses several times/day PEG education completed with nurse navigator on 10/11 Educated on bolus feeding instructions, formula, and water flushes, written instructions provided today Patient scheduled for PEG placement 10/24, will plan to do feeding with patient at next visit Will defer contacting [employer] for formula and supply needs at this time Contact information provided   MONITORING, EVALUATION, GOAL: Patient will tolerate adequate calories and protein to minimize weight loss     Next Visit: Tuesday November 1 during infusion

## 2021-01-25 ENCOUNTER — Other Ambulatory Visit (HOSPITAL_COMMUNITY): Payer: Self-pay | Admitting: Physician Assistant

## 2021-01-28 ENCOUNTER — Encounter (HOSPITAL_COMMUNITY): Payer: Self-pay

## 2021-01-28 ENCOUNTER — Encounter: Payer: Self-pay | Admitting: Oncology

## 2021-01-28 ENCOUNTER — Ambulatory Visit (HOSPITAL_COMMUNITY)
Admission: RE | Admit: 2021-01-28 | Discharge: 2021-01-28 | Disposition: A | Discharge status: Home / Self Care | Payer: PPO | Source: Ambulatory Visit | Attending: Oncology | Admitting: Oncology

## 2021-01-28 ENCOUNTER — Other Ambulatory Visit: Payer: Self-pay | Admitting: Oncology

## 2021-01-28 ENCOUNTER — Other Ambulatory Visit: Payer: Self-pay

## 2021-01-28 DIAGNOSIS — C760 Malignant neoplasm of head, face and neck: Secondary | ICD-10-CM

## 2021-01-28 DIAGNOSIS — Z452 Encounter for adjustment and management of vascular access device: Secondary | ICD-10-CM | POA: Diagnosis not present

## 2021-01-28 DIAGNOSIS — Z7982 Long term (current) use of aspirin: Secondary | ICD-10-CM | POA: Insufficient documentation

## 2021-01-28 DIAGNOSIS — Z79899 Other long term (current) drug therapy: Secondary | ICD-10-CM | POA: Insufficient documentation

## 2021-01-28 DIAGNOSIS — Z431 Encounter for attention to gastrostomy: Secondary | ICD-10-CM | POA: Diagnosis not present

## 2021-01-28 HISTORY — PX: IR IMAGING GUIDED PORT INSERTION: IMG5740

## 2021-01-28 HISTORY — PX: IR GASTROSTOMY TUBE MOD SED: IMG625

## 2021-01-28 LAB — CBC
HCT: 47.3 % (ref 39.0–52.0)
Hemoglobin: 15.8 g/dL (ref 13.0–17.0)
MCH: 29.9 pg (ref 26.0–34.0)
MCHC: 33.4 g/dL (ref 30.0–36.0)
MCV: 89.4 fL (ref 80.0–100.0)
Platelets: 349 10*3/uL (ref 150–400)
RBC: 5.29 MIL/uL (ref 4.22–5.81)
RDW: 12.8 % (ref 11.5–15.5)
WBC: 6.3 10*3/uL (ref 4.0–10.5)
nRBC: 0 % (ref 0.0–0.2)

## 2021-01-28 LAB — PROTIME-INR
INR: 1 (ref 0.8–1.2)
Prothrombin Time: 13.4 seconds (ref 11.4–15.2)

## 2021-01-28 LAB — GLUCOSE, CAPILLARY: Glucose-Capillary: 87 mg/dL (ref 70–99)

## 2021-01-28 MED ORDER — CEFAZOLIN SODIUM-DEXTROSE 2-4 GM/100ML-% IV SOLN
INTRAVENOUS | Status: AC
  Filled 2021-01-28: qty 100

## 2021-01-28 MED ORDER — FENTANYL CITRATE (PF) 100 MCG/2ML IJ SOLN
INTRAMUSCULAR | Status: AC | PRN
  Administered 2021-01-28: 50 ug via INTRAVENOUS

## 2021-01-28 MED ORDER — LIDOCAINE VISCOUS HCL 2 % MT SOLN
OROMUCOSAL | Status: AC
  Filled 2021-01-28: qty 15

## 2021-01-28 MED ORDER — LIDOCAINE-EPINEPHRINE 1 %-1:100000 IJ SOLN
INTRAMUSCULAR | Status: AC | PRN
  Administered 2021-01-28: 10 mL via INTRADERMAL

## 2021-01-28 MED ORDER — MIDAZOLAM HCL 2 MG/2ML IJ SOLN
INTRAMUSCULAR | Status: AC
  Filled 2021-01-28: qty 4

## 2021-01-28 MED ORDER — LIDOCAINE-EPINEPHRINE 1 %-1:100000 IJ SOLN: 10.0000 mL | Freq: Once | INTRAMUSCULAR | Status: DC

## 2021-01-28 MED ORDER — SODIUM CHLORIDE 0.9 % IV SOLN: INTRAVENOUS | Status: DC

## 2021-01-28 MED ORDER — GLUCAGON HCL RDNA (DIAGNOSTIC) 1 MG IJ SOLR
INTRAMUSCULAR | Status: AC
  Filled 2021-01-28: qty 1

## 2021-01-28 MED ORDER — LIDOCAINE-EPINEPHRINE 1 %-1:100000 IJ SOLN
INTRAMUSCULAR | Status: AC
  Filled 2021-01-28: qty 1

## 2021-01-28 MED ORDER — FENTANYL CITRATE (PF) 100 MCG/2ML IJ SOLN
INTRAMUSCULAR | Status: AC
  Filled 2021-01-28: qty 2

## 2021-01-28 MED ORDER — MIDAZOLAM HCL 2 MG/2ML IJ SOLN
INTRAMUSCULAR | Status: AC | PRN
  Administered 2021-01-28: 1 mg via INTRAVENOUS

## 2021-01-28 MED ORDER — IOHEXOL 300 MG/ML  SOLN
25.0000 mL | Freq: Once | INTRAMUSCULAR | Status: AC | PRN
  Administered 2021-01-28: 10 mL

## 2021-01-28 MED ORDER — MIDAZOLAM HCL 2 MG/2ML IJ SOLN
INTRAMUSCULAR | Status: AC | PRN
  Administered 2021-01-28: .5 mg via INTRAVENOUS

## 2021-01-28 MED ORDER — HEPARIN SOD (PORK) LOCK FLUSH 100 UNIT/ML IV SOLN
INTRAVENOUS | Status: AC
  Filled 2021-01-28: qty 5

## 2021-01-28 MED ORDER — LIDOCAINE HCL 1 % IJ SOLN
INTRAMUSCULAR | Status: AC
  Filled 2021-01-28: qty 20

## 2021-01-28 MED ORDER — GLUCAGON HCL (RDNA) 1 MG IJ SOLR
INTRAMUSCULAR | Status: AC | PRN
  Administered 2021-01-28: .5 mg via INTRAVENOUS

## 2021-01-28 MED ORDER — HEPARIN SOD (PORK) LOCK FLUSH 100 UNIT/ML IV SOLN
INTRAVENOUS | Status: AC | PRN
  Administered 2021-01-28: 500 [IU]

## 2021-01-28 MED ORDER — CEFAZOLIN SODIUM-DEXTROSE 2-4 GM/100ML-% IV SOLN
2.0000 g | INTRAVENOUS | Status: AC
  Administered 2021-01-28: 2 g via INTRAVENOUS

## 2021-01-28 NOTE — Sedation Documentation (Signed)
Port a cath placed. Patient being prepped for GTube placement.

## 2021-01-28 NOTE — Progress Notes (Signed)
Unfortunately there aren't any foundations offering copay assistance for pts Dx and the type of ins he has.  I reached out to Ailene Ravel and Vincente Liberty in the radiation dept requesting they reach out to pt regarding the [employer] and prostate grants.

## 2021-01-28 NOTE — Sedation Documentation (Signed)
GTube placement procedure begun.

## 2021-01-28 NOTE — Procedures (Signed)
Interventional Radiology Procedure Note  Procedure: RT IJ POWER PORT  20 FR PULL THRU GTUBE    Complications: None  Estimated Blood Loss:  MIN  Findings: FULL REPORT IN PACS     Tamera Punt, MD

## 2021-01-28 NOTE — Discharge Instructions (Signed)
Urgent needs - Interventional Radiology on call MD [phone]  Port Wound (right chest) - May remove dressing  in 24 to 48 hours.  Keep site clean and dry daily by gently washing with soap and water.  Replace with bandaid as needed.  Do not submerge in tub or water until site healing well. If closed with glue, glue will flake off on its own.  If ordered by your provider, may start Emla cream in 2 weeks or after incision is healed.  After completion of treatment, your provider should have you set up for monthly port flushes.    Contact attending physician for temperature greater than 101 degrees farenheit.    Gastrostomy Wound - see instructions under Gastrostomy care;  Do not submerge in tub or pool and avoid getting site wet unless performing site care.

## 2021-01-28 NOTE — H&P (Signed)
Referring Physician(s): Wyatt Portela  Supervising Physician: Daryll Brod  Patient Status:  [facility]  Chief Complaint:  "I'm getting a port a  cath and stomach tube put in"  Subjective: Patient familiar to IR service from left cervical lymph node biopsy on 10/05/2020.  He has a remote history of prostate cancer and now with newly diagnosed squamous cell carcinoma of the head /neck.  He presents today for both Port-A-Cath and gastrostomy tube placements prior to chemoradiation.  He currently denies fever, headache, chest pain, dyspnea, cough, abdominal/back pain, nausea, vomiting or bleeding.  Additional medical history as below.  Past Medical History:  Diagnosis Date   Cataract    removed bilateraly   Cough    Glaucoma    Hypertension    PFO (patent foramen ovale)    PFO noted on coronary CT 09/05/14   Prostate cancer ([place]) 2009   Past Medical History:  Diagnosis Date   Cataract    removed bilateraly   Cough    Glaucoma    Hypertension    PFO (patent foramen ovale)    PFO noted on coronary CT 09/05/14   Prostate cancer ([place]) 2009      Allergies: Patient has no known allergies.  Medications: Prior to Admission medications   Medication Sig Start Date End Date Taking? Authorizing Provider  aspirin 81 MG EC tablet Take 162 mg by mouth at bedtime.    [provider]  calcium-vitamin D (CALCIUM 500/D) 500-200 MG-UNIT tablet Take 1 tablet by mouth at bedtime.    [provider]  diltiazem (CARDIZEM CD) 120 MG 24 hr capsule Take 240 mg by mouth at bedtime.    [provider]  Flaxseed, Linseed, (FLAXSEED OIL) 1200 MG CAPS Take 2,400 mg by mouth at bedtime.    [provider]  HYDROcodone-acetaminophen (NORCO) 7.5-325 MG tablet Take 1 tablet by mouth every 6 (six) hours as needed for moderate pain. 12/18/20   Izora Gala, MD  latanoprost (XALATAN) 0.005 % ophthalmic solution Place 1 drop into both eyes at bedtime.    [provider]  lidocaine-prilocaine (EMLA) cream Apply 1 application topically as needed. 01/10/21   Wyatt Portela, MD  Multiple Vitamin (MULTIVITAMIN) tablet Take 1 tablet by mouth at bedtime.    [provider]  niacin 500 MG CR capsule Take 500 mg by mouth at bedtime.    [provider]  omeprazole (PRILOSEC) 40 MG capsule TAKE 1 CAPSULE BY MOUTH EVERY DAY 12/28/20   Mauri Pole, MD  ondansetron (ZOFRAN ODT) 8 MG disintegrating tablet Take 1 tablet (8 mg total) by mouth every 8 (eight) hours as needed for nausea or vomiting. 12/18/20   Izora Gala, MD  prochlorperazine (COMPAZINE) 10 MG tablet Take 1 tablet (10 mg total) by mouth every 6 (six) hours as needed for nausea or vomiting. 01/10/21   Wyatt Portela, MD  sucralfate (CARAFATE) 1 g tablet TAKE ONE TABLET BY MOUTH TWICE DAILY Patient taking differently: Take 1 g by mouth at bedtime. 11/28/20   Mauri Pole, MD  timolol (TIMOPTIC) 0.5 % ophthalmic solution Place 1 drop into both eyes daily.    [provider]  Vitamin D, Cholecalciferol, 50 MCG (2000 UT) CAPS Take 2,000 Units by mouth at bedtime.    [provider]     Vital Signs: BP 118/79, temp 98.1, heart rate 68, respirations 20, O2 sat 97% room     Physical Exam awake, alert.  Chest clear  to auscultation bilaterally.  Heart with regular rate and rhythm.  Abdomen soft, protuberant, positive bowel sounds, nontender.  No significant lower extremity edema.  Imaging: No results found.  Labs:  CBC: Recent Labs    10/05/20 1130 12/13/20 1430  WBC 5.7 5.6  HGB 15.4 15.1  HCT 46.0 45.0  PLT 304 302    COAGS: Recent Labs    10/05/20 1130  INR 0.9    BMP: Recent Labs    10/05/20 1130 12/13/20 1430  NA 141 139  K 4.0 4.0  CL 106 107  CO2 30 25  GLUCOSE 103* 76  BUN 13 10  CALCIUM 9.2 8.9  CREATININE 1.14 1.10  GFRNONAA >60 >60    LIVER FUNCTION TESTS: No results for input(s): BILITOT, AST, ALT, ALKPHOS, PROT,  ALBUMIN in the last 8760 hours.  Assessment and Plan: Patient familiar to IR service from left cervical lymph node biopsy on 10/05/2020.  He has a remote history of prostate cancer and now with newly diagnosed squamous cell carcinoma of the head /neck.  He presents today for both Port-A-Cath and gastrostomy tube placements prior to chemoradiation.  Details/risks of procedures, including but not limited to, internal bleeding, infection, injury to adjacent structures, venous thrombosis discussed with patient with his understanding and consent.   Electronically Signed: D. Rowe Robert, PA-C 01/28/2021, 10:30 AM   I spent a total of 25 minutes at the the patient's bedside AND on the patient's hospital floor or unit, greater than 50% of which was counseling/coordinating care for Port-A-Cath and gastrostomy tube placements

## 2021-01-28 NOTE — Sedation Documentation (Signed)
Port a Cath placement procedure begun.

## 2021-01-29 ENCOUNTER — Other Ambulatory Visit: Payer: Self-pay

## 2021-01-29 ENCOUNTER — Ambulatory Visit
Admission: RE | Admit: 2021-01-29 | Discharge: 2021-01-29 | Disposition: A | Discharge status: Home / Self Care | Payer: PPO | Source: Ambulatory Visit | Attending: Radiation Oncology | Admitting: Radiation Oncology

## 2021-01-29 DIAGNOSIS — Z510 Encounter for antineoplastic radiation therapy: Secondary | ICD-10-CM | POA: Diagnosis not present

## 2021-01-29 DIAGNOSIS — C801 Malignant (primary) neoplasm, unspecified: Secondary | ICD-10-CM | POA: Diagnosis not present

## 2021-01-29 DIAGNOSIS — C770 Secondary and unspecified malignant neoplasm of lymph nodes of head, face and neck: Secondary | ICD-10-CM | POA: Diagnosis not present

## 2021-01-29 NOTE — Progress Notes (Signed)
Pharmacist Chemotherapy Monitoring - Initial Assessment    Anticipated start date: 02/05/21   The following has been reviewed per standard work regarding the patient's treatment regimen: The patient's diagnosis, treatment plan and drug doses, and organ/hematologic function Lab orders and baseline tests specific to treatment regimen  The treatment plan start date, drug sequencing, and pre-medications Prior authorization status  Patient's documented medication list, including drug-drug interaction screen and prescriptions for anti-emetics and supportive care specific to the treatment regimen The drug concentrations, fluid compatibility, administration routes, and timing of the medications to be used The patient's access for treatment and lifetime cumulative dose history, if applicable  The patient's medication allergies and previous infusion related reactions, if applicable   Changes made to treatment plan:  N/A  Follow up needed:  Pending authorization for treatment    Larene Beach, [place], 01/29/2021  11:25 AM \

## 2021-01-29 NOTE — Progress Notes (Signed)
Oncology Nurse Navigator Documentation   To provide support, encouragement and care continuity, met with Nicholas Daniels for his initial RT.  He was accompanied by his wife. I reviewed the 2-step treatment process, answered questions.  Mr. Hemric completed treatment without difficulty, denied questions/concerns. I brought Mr. Gullion and his wife to the radiation nursing clinic to provide education and assist him with his first PEG dressing change and flush.  I reviewed the registration/arrival procedure for subsequent treatments. I encouraged them to call me with questions/concerns as tmts proceed.   Harlow Asa RN, BSN, OCN Head & Neck Oncology Nurse [place] at [facility] Phone # [phone]  Fax # [phone]

## 2021-01-30 ENCOUNTER — Ambulatory Visit
Admission: RE | Admit: 2021-01-30 | Discharge: 2021-01-30 | Disposition: A | Discharge status: Home / Self Care | Payer: PPO | Source: Ambulatory Visit | Attending: Radiation Oncology | Admitting: Radiation Oncology

## 2021-01-30 ENCOUNTER — Other Ambulatory Visit (HOSPITAL_COMMUNITY): Payer: Self-pay | Admitting: Physician Assistant

## 2021-01-30 DIAGNOSIS — C770 Secondary and unspecified malignant neoplasm of lymph nodes of head, face and neck: Secondary | ICD-10-CM | POA: Diagnosis not present

## 2021-01-30 DIAGNOSIS — C801 Malignant (primary) neoplasm, unspecified: Secondary | ICD-10-CM | POA: Diagnosis not present

## 2021-01-30 DIAGNOSIS — Z510 Encounter for antineoplastic radiation therapy: Secondary | ICD-10-CM | POA: Diagnosis not present

## 2021-01-31 ENCOUNTER — Ambulatory Visit
Admission: RE | Admit: 2021-01-31 | Discharge: 2021-01-31 | Disposition: A | Discharge status: Home / Self Care | Payer: PPO | Source: Ambulatory Visit | Attending: Radiation Oncology | Admitting: Radiation Oncology

## 2021-01-31 DIAGNOSIS — C770 Secondary and unspecified malignant neoplasm of lymph nodes of head, face and neck: Secondary | ICD-10-CM | POA: Diagnosis not present

## 2021-01-31 DIAGNOSIS — C801 Malignant (primary) neoplasm, unspecified: Secondary | ICD-10-CM | POA: Diagnosis not present

## 2021-01-31 DIAGNOSIS — Z510 Encounter for antineoplastic radiation therapy: Secondary | ICD-10-CM | POA: Diagnosis not present

## 2021-02-01 ENCOUNTER — Ambulatory Visit
Admission: RE | Admit: 2021-02-01 | Discharge: 2021-02-01 | Disposition: A | Discharge status: Home / Self Care | Payer: PPO | Source: Ambulatory Visit | Attending: Radiation Oncology | Admitting: Radiation Oncology

## 2021-02-01 ENCOUNTER — Other Ambulatory Visit: Payer: Self-pay

## 2021-02-01 DIAGNOSIS — C801 Malignant (primary) neoplasm, unspecified: Secondary | ICD-10-CM | POA: Diagnosis not present

## 2021-02-01 DIAGNOSIS — C770 Secondary and unspecified malignant neoplasm of lymph nodes of head, face and neck: Secondary | ICD-10-CM | POA: Diagnosis not present

## 2021-02-01 DIAGNOSIS — Z510 Encounter for antineoplastic radiation therapy: Secondary | ICD-10-CM | POA: Diagnosis not present

## 2021-02-04 ENCOUNTER — Other Ambulatory Visit: Payer: Self-pay

## 2021-02-04 ENCOUNTER — Ambulatory Visit
Admission: RE | Admit: 2021-02-04 | Discharge: 2021-02-04 | Disposition: A | Discharge status: Home / Self Care | Payer: PPO | Source: Ambulatory Visit | Attending: Radiation Oncology | Admitting: Radiation Oncology

## 2021-02-04 ENCOUNTER — Other Ambulatory Visit: Payer: Self-pay | Admitting: Radiation Oncology

## 2021-02-04 DIAGNOSIS — C770 Secondary and unspecified malignant neoplasm of lymph nodes of head, face and neck: Secondary | ICD-10-CM | POA: Diagnosis not present

## 2021-02-04 DIAGNOSIS — Z510 Encounter for antineoplastic radiation therapy: Secondary | ICD-10-CM | POA: Diagnosis not present

## 2021-02-04 DIAGNOSIS — C801 Malignant (primary) neoplasm, unspecified: Secondary | ICD-10-CM | POA: Diagnosis not present

## 2021-02-04 MED ORDER — LIDOCAINE VISCOUS HCL 2 % MT SOLN: OROMUCOSAL | 3 refills | Status: DC

## 2021-02-04 MED FILL — Fosaprepitant Dimeglumine For IV Infusion 150 MG (Base Eq): INTRAVENOUS | Qty: 5 | Status: AC

## 2021-02-04 MED FILL — Dexamethasone Sodium Phosphate Inj 100 MG/10ML: INTRAMUSCULAR | Qty: 1 | Status: AC

## 2021-02-04 NOTE — Progress Notes (Signed)
Pt here for patient teaching. Pt given Radiation and You booklet, Managing Acute Radiation Side Effects for Head and Neck Cancer handout, skin care instructions, and Sonafine.  Reviewed areas of pertinence such as diarrhea, fatigue, hair loss, mouth changes, nausea and vomiting, skin changes, throat changes, urinary and bladder changes, headache, blurry vision, cough, shortness of breath, earaches, and taste changes. Pt able to give teach back of to pat skin, use unscented/gentle soap, have Imodium on hand, and drink plenty of water, apply Sonafine bid, avoid applying anything to skin within 4 hours of treatment, and to use an electric razor if they must shave. Pt verbalizes understanding of information given and will contact nursing with any questions or concerns.     [email or web address]

## 2021-02-05 ENCOUNTER — Ambulatory Visit
Admission: RE | Admit: 2021-02-05 | Discharge: 2021-02-05 | Disposition: A | Discharge status: Home / Self Care | Payer: PPO | Source: Ambulatory Visit | Attending: Radiation Oncology | Admitting: Radiation Oncology

## 2021-02-05 ENCOUNTER — Inpatient Hospital Stay: Payer: PPO

## 2021-02-05 ENCOUNTER — Ambulatory Visit: Payer: PPO | Admitting: Dietician

## 2021-02-05 VITALS — BP 145/77 | HR 78 | Temp 98.8°F | Resp 16 | Wt 199.5 lb

## 2021-02-05 DIAGNOSIS — Z79899 Other long term (current) drug therapy: Secondary | ICD-10-CM | POA: Insufficient documentation

## 2021-02-05 DIAGNOSIS — Z5111 Encounter for antineoplastic chemotherapy: Secondary | ICD-10-CM | POA: Insufficient documentation

## 2021-02-05 DIAGNOSIS — E860 Dehydration: Secondary | ICD-10-CM | POA: Insufficient documentation

## 2021-02-05 DIAGNOSIS — R5383 Other fatigue: Secondary | ICD-10-CM

## 2021-02-05 DIAGNOSIS — R300 Dysuria: Secondary | ICD-10-CM | POA: Insufficient documentation

## 2021-02-05 DIAGNOSIS — Z8546 Personal history of malignant neoplasm of prostate: Secondary | ICD-10-CM | POA: Insufficient documentation

## 2021-02-05 DIAGNOSIS — R000 Tachycardia, unspecified: Secondary | ICD-10-CM | POA: Insufficient documentation

## 2021-02-05 DIAGNOSIS — R1310 Dysphagia, unspecified: Secondary | ICD-10-CM | POA: Insufficient documentation

## 2021-02-05 DIAGNOSIS — I890 Lymphedema, not elsewhere classified: Secondary | ICD-10-CM | POA: Insufficient documentation

## 2021-02-05 DIAGNOSIS — Z95828 Presence of other vascular implants and grafts: Secondary | ICD-10-CM | POA: Insufficient documentation

## 2021-02-05 DIAGNOSIS — Z483 Aftercare following surgery for neoplasm: Secondary | ICD-10-CM | POA: Diagnosis not present

## 2021-02-05 DIAGNOSIS — C770 Secondary and unspecified malignant neoplasm of lymph nodes of head, face and neck: Secondary | ICD-10-CM | POA: Insufficient documentation

## 2021-02-05 DIAGNOSIS — R5381 Other malaise: Secondary | ICD-10-CM

## 2021-02-05 DIAGNOSIS — C801 Malignant (primary) neoplasm, unspecified: Secondary | ICD-10-CM | POA: Insufficient documentation

## 2021-02-05 DIAGNOSIS — Z510 Encounter for antineoplastic radiation therapy: Secondary | ICD-10-CM | POA: Diagnosis not present

## 2021-02-05 DIAGNOSIS — R293 Abnormal posture: Secondary | ICD-10-CM | POA: Diagnosis not present

## 2021-02-05 DIAGNOSIS — C760 Malignant neoplasm of head, face and neck: Secondary | ICD-10-CM | POA: Diagnosis present

## 2021-02-05 DIAGNOSIS — Z7982 Long term (current) use of aspirin: Secondary | ICD-10-CM | POA: Insufficient documentation

## 2021-02-05 LAB — CMP (CANCER CENTER ONLY)
ALT: 24 U/L (ref 0–44)
AST: 18 U/L (ref 15–41)
Albumin: 3.4 g/dL — ABNORMAL LOW (ref 3.5–5.0)
Alkaline Phosphatase: 27 U/L — ABNORMAL LOW (ref 38–126)
Anion gap: 7 (ref 5–15)
BUN: 11 mg/dL (ref 8–23)
CO2: 27 mmol/L (ref 22–32)
Calcium: 8.7 mg/dL — ABNORMAL LOW (ref 8.9–10.3)
Chloride: 106 mmol/L (ref 98–111)
Creatinine: 0.98 mg/dL (ref 0.61–1.24)
GFR, Estimated: >60 mL/min (ref >60)
Glucose, Bld: 152 mg/dL — ABNORMAL HIGH (ref 70–99)
Potassium: 3.7 mmol/L (ref 3.5–5.1)
Sodium: 140 mmol/L (ref 135–145)
Total Bilirubin: 1.1 mg/dL (ref 0.3–1.2)
Total Protein: 6.6 g/dL (ref 6.5–8.1)

## 2021-02-05 LAB — CBC WITH DIFFERENTIAL (CANCER CENTER ONLY)
Abs Immature Granulocytes: 0.02 10*3/uL (ref 0.00–0.07)
Basophils Absolute: 0 10*3/uL (ref 0.0–0.1)
Basophils Relative: 0 %
Eosinophils Absolute: 0.2 10*3/uL (ref 0.0–0.5)
Eosinophils Relative: 4 %
HCT: 39.8 % (ref 39.0–52.0)
Hemoglobin: 13.4 g/dL (ref 13.0–17.0)
Immature Granulocytes: 1 %
Lymphocytes Relative: 19 %
Lymphs Abs: 0.7 10*3/uL (ref 0.7–4.0)
MCH: 29.8 pg (ref 26.0–34.0)
MCHC: 33.7 g/dL (ref 30.0–36.0)
MCV: 88.4 fL (ref 80.0–100.0)
Monocytes Absolute: 0.3 10*3/uL (ref 0.1–1.0)
Monocytes Relative: 8 %
Neutro Abs: 2.8 10*3/uL (ref 1.7–7.7)
Neutrophils Relative %: 68 %
Platelet Count: 250 10*3/uL (ref 150–400)
RBC: 4.5 MIL/uL (ref 4.22–5.81)
RDW: 12.5 % (ref 11.5–15.5)
WBC Count: 4 10*3/uL (ref 4.0–10.5)
nRBC: 0 % (ref 0.0–0.2)

## 2021-02-05 LAB — TSH: TSH: 0.328 u[IU]/mL (ref 0.320–4.118)

## 2021-02-05 MED ORDER — MAGNESIUM SULFATE 2 GM/50ML IV SOLN
2.0000 g | Freq: Once | INTRAVENOUS | Status: AC
  Administered 2021-02-05: 2 g via INTRAVENOUS
  Filled 2021-02-05: qty 50

## 2021-02-05 MED ORDER — SODIUM CHLORIDE 0.9% FLUSH
10.0000 mL | Freq: Once | INTRAVENOUS | Status: AC
  Administered 2021-02-05: 10 mL

## 2021-02-05 MED ORDER — SODIUM CHLORIDE 0.9% FLUSH
10.0000 mL | INTRAVENOUS | Status: DC | PRN
  Administered 2021-02-05: 10 mL

## 2021-02-05 MED ORDER — SODIUM CHLORIDE 0.9 % IV SOLN: Freq: Once | INTRAVENOUS | Status: AC

## 2021-02-05 MED ORDER — SODIUM CHLORIDE 0.9 % IV SOLN
150.0000 mg | Freq: Once | INTRAVENOUS | Status: AC
  Administered 2021-02-05: 150 mg via INTRAVENOUS
  Filled 2021-02-05: qty 150

## 2021-02-05 MED ORDER — SODIUM CHLORIDE 0.9 % IV SOLN
40.0000 mg/m2 | Freq: Once | INTRAVENOUS | Status: AC
  Administered 2021-02-05: 84 mg via INTRAVENOUS
  Filled 2021-02-05: qty 84

## 2021-02-05 MED ORDER — PALONOSETRON HCL INJECTION 0.25 MG/5ML
0.2500 mg | Freq: Once | INTRAVENOUS | Status: AC
  Administered 2021-02-05: 0.25 mg via INTRAVENOUS
  Filled 2021-02-05: qty 5

## 2021-02-05 MED ORDER — HEPARIN SOD (PORK) LOCK FLUSH 100 UNIT/ML IV SOLN
500.0000 [IU] | Freq: Once | INTRAVENOUS | Status: AC | PRN
  Administered 2021-02-05: 500 [IU]

## 2021-02-05 MED ORDER — SODIUM CHLORIDE 0.9 % IV SOLN
10.0000 mg | Freq: Once | INTRAVENOUS | Status: AC
  Administered 2021-02-05: 10 mg via INTRAVENOUS
  Filled 2021-02-05: qty 10

## 2021-02-05 MED ORDER — POTASSIUM CHLORIDE IN NACL 20-0.9 MEQ/L-% IV SOLN
Freq: Once | INTRAVENOUS | Status: AC
  Filled 2021-02-05: qty 1000

## 2021-02-05 NOTE — Patient Instructions (Addendum)
[facility]  Discharge Instructions: Thank you for choosing [facility] to provide your oncology and hematology care.   If you have a lab appointment with the Cancer Center, please go directly to the Cancer Center and check in at the registration area.   Wear comfortable clothing and clothing appropriate for easy access to any Portacath or PICC line.   We strive to give you quality time with your provider. You may need to reschedule your appointment if you arrive late (15 or more minutes).  Arriving late affects you and other patients whose appointments are after yours.  Also, if you miss three or more appointments without notifying the office, you may be dismissed from the clinic at the provider's discretion.      For prescription refill requests, have your pharmacy contact our office and allow 72 hours for refills to be completed.    Today you received the following chemotherapy and/or immunotherapy agents: Cisplatin     To help prevent nausea and vomiting after your treatment, we encourage you to take your nausea medication as directed.  BELOW ARE SYMPTOMS THAT SHOULD BE REPORTED IMMEDIATELY: *FEVER GREATER THAN 100.4 F (38 C) OR HIGHER *CHILLS OR SWEATING *NAUSEA AND VOMITING THAT IS NOT CONTROLLED WITH YOUR NAUSEA MEDICATION *UNUSUAL SHORTNESS OF BREATH *UNUSUAL BRUISING OR BLEEDING *URINARY PROBLEMS (pain or burning when urinating, or frequent urination) *BOWEL PROBLEMS (unusual diarrhea, constipation, pain near the anus) TENDERNESS IN MOUTH AND THROAT WITH OR WITHOUT PRESENCE OF ULCERS (sore throat, sores in mouth, or a toothache) UNUSUAL RASH, SWELLING OR PAIN  UNUSUAL VAGINAL DISCHARGE OR ITCHING   Items with * indicate a potential emergency and should be followed up as soon as possible or go to the Emergency Department if any problems should occur.  Please show the CHEMOTHERAPY ALERT CARD or IMMUNOTHERAPY ALERT CARD at check-in to  the Emergency Department and triage nurse.  Should you have questions after your visit or need to cancel or reschedule your appointment, please contact [facility]  Dept: [phone]  and follow the prompts.  Office hours are 8:00 a.m. to 4:30 p.m. Monday - Friday. Please note that voicemails left after 4:00 p.m. may not be returned until the following business day.  We are closed weekends and major holidays. You have access to a nurse at all times for urgent questions. Please call the main number to the clinic Dept: [phone] and follow the prompts.   For any non-urgent questions, you may also contact your provider using MyChart. We now offer e-Visits for anyone 18 and older to request care online for non-urgent symptoms. For details visit [email or web address].   Also download the MyChart app! Go to the app store, search "MyChart", open the app, select [facility], and log in with your MyChart username and password.  Due to Covid, a mask is required upon entering the hospital/clinic. If you do not have a mask, one will be given to you upon arrival. For doctor visits, patients may have 1 support person aged 18 or older with them. For treatment visits, patients cannot have anyone with them due to current Covid guidelines and our immunocompromised population.   Cisplatin injection What is this medication? CISPLATIN (SIS pla tin) is a chemotherapy drug. It targets fast dividing cells, like cancer cells, and causes these cells to die. This medicine is used totreat many types of cancer like bladder, ovarian, and testicular cancers. This medicine may be used for other   purposes; ask your health care provider orpharmacist if you have questions. COMMON BRAND NAME(S): Platinol, Platinol -AQ What should I tell my care team before I take this medication? They need to know if you have any of these conditions: eye disease, vision problems hearing problems kidney  disease low blood counts, like white cells, platelets, or red blood cells tingling of the fingers or toes, or other nerve disorder an unusual or allergic reaction to cisplatin, carboplatin, oxaliplatin, other medicines, foods, dyes, or preservatives pregnant or trying to get pregnant breast-feeding How should I use this medication? This drug is given as an infusion into a vein. It is administered in a hospitalor clinic by a specially trained health care professional. Talk to your pediatrician regarding the use of this medicine in children.Special care may be needed. Overdosage: If you think you have taken too much of this medicine contact apoison control center or emergency room at once. NOTE: This medicine is only for you. Do not share this medicine with others. What if I miss a dose? It is important not to miss a dose. Call your doctor or health careprofessional if you are unable to keep an appointment. What may interact with this medication? This medicine may interact with the following medications: foscarnet certain antibiotics like amikacin, gentamicin, neomycin, polymyxin B, streptomycin, tobramycin, vancomycin This list may not describe all possible interactions. Give your health care provider a list of all the medicines, herbs, non-prescription drugs, or dietary supplements you use. Also tell them if you smoke, drink alcohol, or use illegaldrugs. Some items may interact with your medicine. What should I watch for while using this medication? Your condition will be monitored carefully while you are receiving this medicine. You will need important blood work done while you are taking thismedicine. This drug may make you feel generally unwell. This is not uncommon, as chemotherapy can affect healthy cells as well as cancer cells. Report any side effects. Continue your course of treatment even though you feel ill unless yourdoctor tells you to stop. This medicine may increase your risk of  getting an infection. Call your healthcare professional for advice if you get a fever, chills, or sore throat, or other symptoms of a cold or flu. Do not treat yourself. Try to avoid beingaround people who are sick. Avoid taking medicines that contain aspirin, acetaminophen, ibuprofen, naproxen, or ketoprofen unless instructed by your healthcare professional.These medicines may hide a fever. This medicine may increase your risk to bruise or bleed. Call your doctor orhealth care professional if you notice any unusual bleeding. Be careful brushing and flossing your teeth or using a toothpick because you may get an infection or bleed more easily. If you have any dental work done,tell your dentist you are receiving this medicine. Do not become pregnant while taking this medicine or for 14 months after stopping it. Women should inform their healthcare professional if they wish to become pregnant or think they might be pregnant. Men should not father a child while taking this medicine and for 11 months after stopping it. There is potential for serious side effects to an unborn child. Talk to your healthcareprofessional for more information. Do not breast-feed an infant while taking this medicine. This medicine has caused ovarian failure in some women. This medicine may make it more difficult to get pregnant. Talk to your healthcare professional if youare concerned about your fertility. This medicine has caused decreased sperm counts in some men. This may make it more difficult to father a child.   Talk to your healthcare professional if youare concerned about your fertility. Drink fluids as directed while you are taking this medicine. This will helpprotect your kidneys. Call your doctor or health care professional if you get diarrhea. Do not treatyourself. What side effects may I notice from receiving this medication? Side effects that you should report to your doctor or health care professionalas soon as  possible: allergic reactions like skin rash, itching or hives, swelling of the face, lips, or tongue blurred vision changes in vision decreased hearing or ringing of the ears nausea, vomiting pain, redness, or irritation at site where injected pain, tingling, numbness in the hands or feet signs and symptoms of bleeding such as bloody or black, tarry stools; red or dark brown urine; spitting up blood or brown material that looks like coffee grounds; red spots on the skin; unusual bruising or bleeding from the eyes, gums, or nose signs and symptoms of infection like fever; chills; cough; sore throat; pain or trouble passing urine signs and symptoms of kidney injury like trouble passing urine or change in the amount of urine signs and symptoms of low red blood cells or anemia such as unusually weak or tired; feeling faint or lightheaded; falls; breathing problems Side effects that usually do not require medical attention (report to yourdoctor or health care professional if they continue or are bothersome): loss of appetite mouth sores muscle cramps This list may not describe all possible side effects. Call your doctor for medical advice about side effects. You may report side effects to FDA at1-800-FDA-1088. Where should I keep my medication? This drug is given in a hospital or clinic and will not be stored at home. NOTE: This sheet is a summary. It may not cover all possible information. If you have questions about this medicine, talk to your doctor, pharmacist, orhealth care provider.  2022 Elsevier/Gold Standard (2018-03-19 15:59:17)  

## 2021-02-05 NOTE — Progress Notes (Signed)
Nutrition Follow-up:  Patient with SCC of head and neck. He is s/p left neck dissection on 9/12. Patient receiving concurrent chemoradiation with weekly cisplatin, C1D1 starting today. He underwent PEG placement 10/24.  Met with patient during infusion. He reports ongoing left-sided facial numbness s/p neck dissection, his mouth is "not as droopy" and feels this is getting better. Patient reports lingering abdominal tenderness from recent PEG placement. He has not started using tube, does not feel he needs to at this time. Patient is flushing with 60 ml water daily. Patient denies swallowing difficulties, he has a good appetite, is eating well and tolerating regular textures. This morning he had a big bowl of cereal. Patient brought a large water bottle with him as well as a frozen meal for lunch today, but will not be able to warm this. Patient educated on foods/supplements available to him. Patient denies nutrition impact symptoms.   Medications: Xylocaine, Compazine, Zofran, Norco  Labs: Glucose 152  Anthropometrics: Weight 201.6 lb on 10/31 stable. Patient weighed 202.6 lb on 10/14   Estimated Energy Needs  Kcals: 2350-2575 Protein: 108-121 Fluid: 2.5 L  NUTRITION DIAGNOSIS: Predicted suboptimal intake stable   INTERVENTION:  Continue eating regular textures as tolerated Reviewed soft, moist high protein foods Patient is currently not using feeding tube, continue daily water flushes via PEG Patient will initiate tube feedings with decreased oral intake/weight loss  Patient has contact information  MONITORING, EVALUATION, GOAL: weight trends, intake   NEXT VISIT: Tuesday November 8 during infusion

## 2021-02-06 ENCOUNTER — Other Ambulatory Visit: Payer: Self-pay

## 2021-02-06 ENCOUNTER — Ambulatory Visit
Admission: RE | Admit: 2021-02-06 | Discharge: 2021-02-06 | Disposition: A | Discharge status: Home / Self Care | Payer: PPO | Source: Ambulatory Visit | Attending: Radiation Oncology | Admitting: Radiation Oncology

## 2021-02-06 DIAGNOSIS — R300 Dysuria: Secondary | ICD-10-CM | POA: Diagnosis not present

## 2021-02-06 DIAGNOSIS — Z5111 Encounter for antineoplastic chemotherapy: Secondary | ICD-10-CM | POA: Diagnosis not present

## 2021-02-06 DIAGNOSIS — C770 Secondary and unspecified malignant neoplasm of lymph nodes of head, face and neck: Secondary | ICD-10-CM | POA: Diagnosis not present

## 2021-02-06 DIAGNOSIS — C801 Malignant (primary) neoplasm, unspecified: Secondary | ICD-10-CM | POA: Diagnosis not present

## 2021-02-07 ENCOUNTER — Encounter: Payer: Self-pay | Admitting: Physical Therapy

## 2021-02-07 ENCOUNTER — Ambulatory Visit
Admission: RE | Admit: 2021-02-07 | Discharge: 2021-02-07 | Disposition: A | Discharge status: Home / Self Care | Payer: PPO | Source: Ambulatory Visit | Attending: Radiation Oncology | Admitting: Radiation Oncology

## 2021-02-07 ENCOUNTER — Encounter: Payer: Self-pay | Admitting: General Practice

## 2021-02-07 ENCOUNTER — Ambulatory Visit: Payer: PPO | Admitting: Physical Therapy

## 2021-02-07 ENCOUNTER — Ambulatory Visit: Payer: PPO

## 2021-02-07 DIAGNOSIS — I890 Lymphedema, not elsewhere classified: Secondary | ICD-10-CM | POA: Insufficient documentation

## 2021-02-07 DIAGNOSIS — R293 Abnormal posture: Secondary | ICD-10-CM

## 2021-02-07 DIAGNOSIS — Z5111 Encounter for antineoplastic chemotherapy: Secondary | ICD-10-CM | POA: Diagnosis not present

## 2021-02-07 DIAGNOSIS — C770 Secondary and unspecified malignant neoplasm of lymph nodes of head, face and neck: Secondary | ICD-10-CM

## 2021-02-07 DIAGNOSIS — Z483 Aftercare following surgery for neoplasm: Secondary | ICD-10-CM | POA: Insufficient documentation

## 2021-02-07 DIAGNOSIS — R1310 Dysphagia, unspecified: Secondary | ICD-10-CM | POA: Insufficient documentation

## 2021-02-07 DIAGNOSIS — R300 Dysuria: Secondary | ICD-10-CM | POA: Diagnosis not present

## 2021-02-07 DIAGNOSIS — C801 Malignant (primary) neoplasm, unspecified: Secondary | ICD-10-CM | POA: Diagnosis not present

## 2021-02-07 NOTE — Therapy (Signed)
[facility] [address] [place], [place], [address] Phone: [phone]   Fax:  [phone]  Speech Language Pathology Evaluation  Patient Details  Name: Nicholas Daniels MRN: [id number] Date of Birth: 05/29/1950 Referring Provider (SLP): Eppie Gibson, MD   Encounter Date: 02/07/2021   End of Session - 02/07/21 1224     Visit Number 1    Number of Visits 3    Date for SLP Re-Evaluation 05/08/21    SLP Start Time 0933    SLP Stop Time  1008    SLP Time Calculation (min) 35 min    Activity Tolerance Patient tolerated treatment well             Past Medical History:  Diagnosis Date   Cataract    removed bilateraly   Cough    Glaucoma    Hypertension    PFO (patent foramen ovale)    PFO noted on coronary CT 09/05/14   Prostate cancer ([place]) 2009    Past Surgical History:  Procedure Laterality Date   CATARACT EXTRACTION     bilateral and a wrinkle on retina on the left    CHOLECYSTECTOMY     COLONOSCOPY     DIRECT LARYNGOSCOPY N/A 12/17/2020   Procedure: DIRECT LARYNGOSCOPY WITH BIOPSIES;  Surgeon: Izora Gala, MD;  Location: [place];  Service: ENT;  Laterality: N/A;   ESOPHAGOSCOPY N/A 12/17/2020   Procedure: ESOPHAGOSCOPY;  Surgeon: Izora Gala, MD;  Location: [place];  Service: ENT;  Laterality: N/A;   IR GASTROSTOMY TUBE MOD SED  01/28/2021   IR IMAGING GUIDED PORT INSERTION  01/28/2021   PROSTATE SURGERY  2009   radiation treatment     RADICAL NECK DISSECTION Left 12/17/2020   Procedure: LEFT NECK DISSECTION;  Surgeon: Izora Gala, MD;  Location: [place];  Service: ENT;  Laterality: Left;   RESECTION OF MEDIASTINAL MASS N/A 02/26/2015   Procedure: RESECTION OF MEDIASTINAL MASS;  Surgeon: Ivin Poot, MD;  Location: [place];  Service: Thoracic;  Laterality: N/A;   STERNOTOMY N/A 02/26/2015   Procedure: STERNOTOMY;  Surgeon: Ivin Poot, MD;  Location: [facility];  Service: Thoracic;  Laterality: N/A;   UPPER  GASTROINTESTINAL ENDOSCOPY      There were no vitals filed for this visit.   Subjective Assessment - 02/07/21 0943     Subjective Pt denies any overt s/sx dificulty with meals and liquids.                SLP Evaluation [facility] - 02/07/21 2263       SLP Visit Information   SLP Received On 02/07/21    Referring Provider (SLP) Eppie Gibson, MD    Onset Date Spring 2022    Medical Diagnosis SCC of lt cervical lymph node      Pain Assessment   Currently in Pain? No/denies      General Information   HPI Pt PET scan on 08/31/20 due to PSA elevation with a history of recurrent prostate cancer treated with radiation in 2017, demonstrated enlarged level 2 lymph nodes with minimal uptake concerning for neoplasm. 10/05/20 left cervical lymph node biopsy revealed poorly differentiated carcinoma, PSA staining was negative. 10/23/20 He discussed options with Dr. Alen Blew and was referred to Dr. Constance Holster to discuss surgery. 12/17/20 left neck level 1-3 lymph node biopsies revealing metastatic SCC in 2 of 15 lymph nodes with extracapsular extension present. CT scan on 12/31/20 demonstrated no chest metastasis. He was  seen in consult with Dr. Isidore Moos on 01/01/21. He will receive 30 fractions of radiation to his Oropharynx and bilateral neck with weekly cisplatin.  He started on 01/29/21 and will complete on 03/13/21. He had PAC/PEG placed 01/28/21.   Mobility Status Ambulatory      Prior Functional Status   Cognitive/Linguistic Baseline Within functional limits      Cognition   Overall Cognitive Status Within Functional Limits for tasks assessed      Auditory Comprehension   Overall Auditory Comprehension Appears within functional limits for tasks assessed      Oral Motor/Sensory Function   Overall Oral Motor/Sensory Function Appears within functional limits for tasks assessed      Motor Speech   Overall Motor Speech Appears within functional limits for tasks assessed            Pt currently  tolerates regular diet and thin liquids currently. POs: Pt ate [place] sandwich and drank H2O without overt s/s oral or pharyngeal difficulty. Thyroid elevation appeared WNL, and swallows appeared timely. Pt's swallow deemed WNL/WFL at this time.   Because data states the risk for dysphagia during and after radiation treatment is high due to undergoing radiation tx, SLP taught pt about the possibility of reduced/limited ability for PO intake during rad tx. SLP encouraged pt to continue swallowing POs as far into rad tx as possible, even ingesting POs and/or completing HEP shortly after administration of pain meds. Among other modifications for days when pt cannot functionally swallow, SLP talked about performing only non-swallowing tasks on the handout/HEP, and if necessary to cycle through the swallowing portion so the program of exercises can be completed instead of fatiguing on one of the swallowing exercises and not being able to perform the other swallowing exercises and then add all reps of swallowing tasks back in when it becomes possible to do so.  SLP educated pt re: changes to swallowing musculature after rad tx, and why adherence to dysphagia HEP provided today and PO consumption was necessary to inhibit muscular disuse atrophy and to reduce muscle fibrosis following rad tx. Pt demonstrated understanding of these things to SLP.    SLP then developed a HEP for pt and pt was instructed how to perform exercises involving lingual, vocal, and pharyngeal strengthening. SLP performed each exercise and pt return demonstrated each exercise. SLP ensured pt performance was correct prior to moving on to next exercise. Pt was instructed to complete this program 2 times a day,  until 6 months after his last rad tx, then x2-3 a week after that.                  SLP Education - 02/07/21 1223     Education Details late effects head/neck radiation on swallow function, HEP procedure    Person(s)  Educated Patient    Methods Explanation;Demonstration;Verbal cues;Handout    Comprehension Verbalized understanding;Returned demonstration;Verbal cues required;Need further instruction             Short Term Goals 02-07-21 SLP SHORT TERM GOAL #1     Title pt will complete HEP with rare min A    Time 2    Period --   vists, for all STGs    Status New           SLP SHORT TERM GOAL #2    Title pt will tell SLP why pt is completing HEP with modified independence    Time 1    Status New  SLP SHORT TERM GOAL #3    Title pt will describe 3 overt s/s aspiration PNA with modified independence    Time 3    Status New           SLP SHORT TERM GOAL #4    Title pt will tell SLP how a food journal could hasten return to a more normalized diet    Time 3    Status New                     Long Term Goals 02-07-21                 SLP LONG TERM GOAL #1    Title pt will complete HEP with modified independence over two visits    Time 4    Period --   visits, for all LTGs    Status New           SLP LONG TERM GOAL #2    Title pt will describe how to modify HEP over time, and the timeline associated with reduction in HEP frequency with modified independence over two sessions    Time 5    Status New       Plan - 02/07/21        Clinical Impression Statement At this time pt swallowing is deemed WNL/WFL with regular diet/thin liquids. SLP designed an individualized HEP for dysphagia and pt completed each exercise on their own with consistent min-mod cues faded to modified independent. There are no overt s/s aspiration reported by pt at this time. Data indicate that pt's swallow ability will likely decrease over the course of radiation/chemotherapy and could very well decline over time following conclusion of their radiation therapy due to muscle disuse atrophy and/or muscle fibrosis. Pt will cont to need to be seen by SLP in order to assess safety of PO intake, assess the need for  recommending any objective swallow assessment, and ensuring pt correctly completes the individualized HEP.    Speech Therapy Frequency --   once approx every four weeks    Duration --   90 days for this reporting period; overall, approx 7 visits    Treatment/Interventions Aspiration precaution training;Pharyngeal strengthening exercises;Diet toleration management by SLP;Trials of upgraded texture/liquids;Patient/family education;SLP instruction and feedback;Compensatory techniques    Potential to Achieve Goals Good    SLP Home Exercise Plan provided    Consulted and Agree with Plan of Care Patient         Patient will benefit from skilled therapeutic intervention in order to improve the following deficits and impairments:   Dysphagia, unspecified type    Problem List Patient Active Problem List   Diagnosis Date Noted   Port-A-Cath in place 02/05/2021   Head and neck cancer ([place]) 01/01/2021   SPK (superficial punctate keratitis), left 12/13/2020   Dry eyes, bilateral 12/13/2020   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, [place], [place], [address] Phone: [phone]  Fax:  [phone]  Name: Nicholas Daniels MRN: [id number] Date of Birth: 18-Apr-1950

## 2021-02-07 NOTE — Progress Notes (Signed)
[facility] CSW Progress Notes  Brief check in visit w patient during [facility].  Provided information packet and my contact information - encouraged him to call as needed for support/resources.  Edwyna Shell, LCSW Clinical Social Worker Phone:  [phone]

## 2021-02-07 NOTE — Patient Instructions (Signed)
SWALLOWING EXERCISES Do these until 6 months after your last day of radiation, then 2-3 times per week afterwards  Effortful Swallows - Press your tongue against the roof of your mouth for 3 seconds, then squeeze the muscles in your neck while you swallow your saliva or a sip of water - Repeat 10-15 times, 2-3 times a day, and use whenever you eat or drink  [place] Swallow - swallow with your tongue sticking out - Stick tongue out past your lips and gently bite tongue with your teeth - Swallow, while holding your tongue with your teeth - Repeat 10-15 times, 2-3 times a day *use a wet spoon if your mouth gets dry*  Shaker Exercise - head lift - Lie flat on your back in your bed or on a couch without pillows - Raise your head and look at your feet - KEEP YOUR SHOULDERS DOWN - HOLD FOR 45-60 SECONDS, then lower your head back down - Repeat 3 times, 2-3 times a day  [employer] - "half swallow" exercise - Start to swallow, and keep your Adam's apple up by squeezing hard with the muscles of the throat - Hold the squeeze for 5-7 seconds and then relax - Repeat 10-15 times, 2-3 times a day *use a wet spoon if your mouth gets dry*  Chin pushback - Open your mouth  - Place your fist UNDER your chin near your neck - Tuck your chin and push back with your fist for 5 seconds - Repeat 10 times, 2-3 times a day       6.   "Super Swallow"  - Take a breath and hold it  - Bear down (like pushing your bowels)  - Swallow then IMMEDIATELY cough  - Repeat 10 times, 2-3 times a day

## 2021-02-07 NOTE — Therapy (Signed)
[facility] [place], [place], [address] Phone: [phone]   Fax:  [phone]  Physical Therapy Evaluation  Patient Details  Name: Nicholas Daniels MRN: [id number] Date of Birth: [birth year] Referring Provider (PT): Isidore Moos   Encounter Date: 02/07/2021   PT End of Session - 02/07/21 0910     Visit Number 1    Number of Visits 9    Date for PT Re-Evaluation 03/07/21    PT Start Time 5035    PT Stop Time 0904    PT Time Calculation (min) 29 min    Activity Tolerance Patient tolerated treatment well    Behavior During Therapy [facility] for tasks assessed/performed             Past Medical History:  Diagnosis Date   Cataract    removed bilateraly   Cough    Glaucoma    Hypertension    PFO (patent foramen ovale)    PFO noted on coronary CT 09/05/14   Prostate cancer ([place]) 2009    Past Surgical History:  Procedure Laterality Date   CATARACT EXTRACTION     bilateral and a wrinkle on retina on the left    CHOLECYSTECTOMY     COLONOSCOPY     DIRECT LARYNGOSCOPY N/A 12/17/2020   Procedure: DIRECT LARYNGOSCOPY WITH BIOPSIES;  Surgeon: Izora Gala, MD;  Location: [place];  Service: ENT;  Laterality: N/A;   ESOPHAGOSCOPY N/A 12/17/2020   Procedure: ESOPHAGOSCOPY;  Surgeon: Izora Gala, MD;  Location: [facility];  Service: ENT;  Laterality: N/A;   IR GASTROSTOMY TUBE MOD SED  01/28/2021   IR IMAGING GUIDED PORT INSERTION  01/28/2021   PROSTATE SURGERY  2009   radiation treatment     RADICAL NECK DISSECTION Left 12/17/2020   Procedure: LEFT NECK DISSECTION;  Surgeon: Izora Gala, MD;  Location: [place];  Service: ENT;  Laterality: Left;   RESECTION OF MEDIASTINAL MASS N/A 02/26/2015   Procedure: RESECTION OF MEDIASTINAL MASS;  Surgeon: Ivin Poot, MD;  Location: [place];  Service: Thoracic;  Laterality: N/A;   STERNOTOMY N/A 02/26/2015   Procedure: STERNOTOMY;  Surgeon: Ivin Poot, MD;  Location: [facility];   Service: Thoracic;  Laterality: N/A;   UPPER GASTROINTESTINAL ENDOSCOPY      There were no vitals filed for this visit.    Subjective Assessment - 02/07/21 0832     Subjective I have 8 radiation sessions so far. I am feeling fatigued from the chemo.    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 The PET demonstrated enlarged level 2 lymph nodes with minimal uptake concerning for neoplasm, 10/05/20 left cervical lymph node biopsy revealed poorly differentiated carcinoma, PSA staining was negative, 12/17/20 left neck level 1-3 lymph node biopsies revealing metastatic SCC in 2 of 15 lymph nodes with extracapsular extension present. CT scan on 12/31/20 demonstrated no chest metastasis, will receive 30 fractions of radiation to his Oropharynx and bilateral neck with weekly cisplatin.  He started on 01/29/21 and will complete on 03/13/21, PAC/PEG 01/28/21    Patient Stated Goals to gain info from providers    Currently in Pain? No/denies    Pain Score 0-No pain                [facility] PT Assessment -  02/07/21 0001       Assessment   Medical Diagnosis SCC of L cervical lymph nodes    Referring Provider (PT) Isidore Moos    Onset Date/Surgical Date 12/17/20    Hand Dominance Right    Prior Therapy none      Precautions   Precautions Other (comment)    Precaution Comments active cancer      Restrictions   Weight Bearing Restrictions No      Balance Screen   Has the patient fallen in the past 6 months No    Has the patient had a decrease in activity level because of a fear of falling?  No    Is the patient reluctant to leave their home because of a fear of falling?  No      Home Ecologist residence    Living Arrangements Spouse/significant other    Available Help at Discharge Family    Type  of [place]      Prior Function   Level of Independence Independent    Vocation Self employed    Occupational psychologist    Leisure pt walks 3x/wk for at least 30 min maybe longer      Cognition   Overall Cognitive Status Within Functional Limits for tasks assessed      Functional Tests   Functional tests Sit to Stand      Sit to Stand   Comments 30 sec sit to stand: 21 reps avg for his age is 16      Posture/Postural Control   Posture/Postural Control Postural limitations    Postural Limitations Rounded Shoulders;Forward head      AROM   Cervical Flexion WFL    Cervical Extension 25% limited due to healing incision    Cervical - Right Side Bend WFL    Cervical - Left Side Bend WFL    Cervical - Right Rotation WFL    Cervical - Left Rotation [facility]      Ambulation/Gait   Ambulation/Gait Yes    Ambulation/Gait Assistance 7: Independent    Ambulation Distance (Feet) 10 Feet    Gait Pattern Narrow base of support    Ambulation Surface Level               LYMPHEDEMA/ONCOLOGY QUESTIONNAIRE - 02/07/21 0001       Lymphedema Assessments   Lymphedema Assessments Head and Neck      Head and Neck   4 cm superior to sternal notch around neck 43 cm    6 cm superior to sternal notch around neck 44.4 cm    8 cm superior to sternal notch around neck 46 cm                     Objective measurements completed on examination: See above findings.                PT Education - 02/07/21 0909     Education Details Neck ROM, importance of posture when sitting, standing and lying down, deep breathing, walking program and importance of staying active throughout treatment, CURE article on staying active, "Why exercise?" flyer, lymphedema and PT info    Person(s) Educated Patient    Methods Explanation;Handout    Comprehension Verbalized understanding                 PT Long Term Goals - 02/07/21 0920       PT LONG TERM GOAL #1  Title Pt will be independent in self MLD for long term management of lymphedema.    Time 4    Period Weeks    Status New    Target Date 03/07/21      PT LONG TERM GOAL #2   Title Pt will obtain appropriate compression garment for long term management of lymphedema.    Time 4    Period Weeks    Status New    Target Date 03/07/21                Head and Neck Clinic Goals - 02/07/21 0920       Patient will be able to verbalize understanding of a home exercise program for cervical range of motion, posture, and walking.          Time 1    Period Days    Status Achieved      Patient will be able to verbalize understanding of proper sitting and standing posture.          Time 1    Period Days    Status Achieved      Patient will be able to verbalize understanding of lymphedema risk and availability of treatment for this condition.          Time 1    Period Days    Status Achieved                Plan - 02/07/21 0912     Clinical Impression Statement Pt reports to PT with recently diagnosed SCC of L cervical lymph nodes. He has undergone several different lymph node biopsies revealing metastatic SCC in 2/15 lymph nodes. He does have some slightly limited cervical extension due to his left side anterior neck scar and he does present with some edema in anterior and left side of neck since his surgery. There is also increased scar tissue. Pt is currently undergoing radiation and chemo.Educated pt about signs and symptoms of lymphedema as well as anatomy and physiology of lymphatic system. Educated pt in importance of staying as active as possible throughout treatment to decrease fatigue as well as head and neck ROM exercises to decrease loss of ROM. Pt would benefit from skilled PT services to be instructed in self MLD technique and assist pt with obtaining a compression garment to decrease further swelling as he undergoes radiation and to teach pt independent management of  lymphedema.    Stability/Clinical Decision Making Stable/Uncomplicated    Clinical Decision Making Low    Rehab Potential Good    PT Frequency 2x / week    PT Duration 4 weeks    PT Treatment/Interventions ADLs/Self Care Home Management;Therapeutic exercise;Patient/family education;Manual techniques;Manual lymph drainage;Scar mobilization;Passive range of motion;Taping;Vasopneumatic Device    PT Next Visit Plan begin MLD to head and neck and instruct using Norton anterior approach, instruct in scar mobilization - once pt is indep can place on hold until completion of radiation and then schedule for a follow up 2 weeks after completion of radiation    PT Home Exercise Plan head and neck ROM exercises    Consulted and Agree with Plan of Care Patient             Patient will benefit from skilled therapeutic intervention in order to improve the following deficits and impairments:  Postural dysfunction, Increased fascial restricitons, Decreased range of motion, Decreased scar mobility, Increased edema  Visit Diagnosis: Lymphedema, not elsewhere classified  Aftercare following surgery for neoplasm  Abnormal  posture  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 [facility])     Problem List Patient Active Problem List   Diagnosis Date Noted   Port-A-Cath in place 02/05/2021   Head and neck cancer ([place]) 01/01/2021   SPK (superficial punctate keratitis), left 12/13/2020   Dry eyes, bilateral 12/13/2020   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   Increasing PSA level after treatment for prostate cancer 11/22/2020   Macular retinoschisis, left 11/08/2020   Left epiretinal membrane 10/17/2019   Right epiretinal membrane 10/17/2019   Early stage nonexudative age-related macular degeneration of both eyes 10/17/2019   Drug-induced gynecomastia 08/14/2015   Mediastinal mass 08/09/2014   History of prostate cancer 08/09/2014   Hypertension 08/09/2014    Manus Gunning, PT 02/07/2021, 9:22 AM  [facility] @ [place], [place], [address] Phone: [phone]   Fax:  [phone]  Name: Nicholas Daniels MRN: [id number] Date of Birth: 06-13-1950  Manus Gunning, PT 02/07/21 9:22 AM

## 2021-02-07 NOTE — Progress Notes (Signed)
Oncology Nurse Navigator Documentation   I met with Nicholas Daniels before his scheduled appointments in head and neck MDC today. He is doing well without complaints at this time. He knows to call me if he has any future questions or concerns.  Harlow Asa RN, BSN, OCN Head & Neck Oncology Nurse [place] at [facility] Phone # [phone]  Fax # [phone]

## 2021-02-08 ENCOUNTER — Other Ambulatory Visit: Payer: Self-pay

## 2021-02-08 ENCOUNTER — Ambulatory Visit
Admission: RE | Admit: 2021-02-08 | Discharge: 2021-02-08 | Disposition: A | Discharge status: Home / Self Care | Payer: PPO | Source: Ambulatory Visit | Attending: Radiation Oncology | Admitting: Radiation Oncology

## 2021-02-08 DIAGNOSIS — Z5111 Encounter for antineoplastic chemotherapy: Secondary | ICD-10-CM | POA: Diagnosis not present

## 2021-02-08 DIAGNOSIS — R300 Dysuria: Secondary | ICD-10-CM | POA: Diagnosis not present

## 2021-02-08 DIAGNOSIS — C770 Secondary and unspecified malignant neoplasm of lymph nodes of head, face and neck: Secondary | ICD-10-CM | POA: Diagnosis not present

## 2021-02-08 DIAGNOSIS — C801 Malignant (primary) neoplasm, unspecified: Secondary | ICD-10-CM | POA: Diagnosis not present

## 2021-02-11 ENCOUNTER — Other Ambulatory Visit: Payer: Self-pay

## 2021-02-11 ENCOUNTER — Ambulatory Visit
Admission: RE | Admit: 2021-02-11 | Discharge: 2021-02-11 | Disposition: A | Discharge status: Home / Self Care | Payer: PPO | Source: Ambulatory Visit | Attending: Radiation Oncology | Admitting: Radiation Oncology

## 2021-02-11 DIAGNOSIS — Z5111 Encounter for antineoplastic chemotherapy: Secondary | ICD-10-CM | POA: Diagnosis not present

## 2021-02-11 DIAGNOSIS — C770 Secondary and unspecified malignant neoplasm of lymph nodes of head, face and neck: Secondary | ICD-10-CM | POA: Diagnosis not present

## 2021-02-11 DIAGNOSIS — R300 Dysuria: Secondary | ICD-10-CM | POA: Diagnosis not present

## 2021-02-11 DIAGNOSIS — C801 Malignant (primary) neoplasm, unspecified: Secondary | ICD-10-CM | POA: Diagnosis not present

## 2021-02-11 MED FILL — Dexamethasone Sodium Phosphate Inj 100 MG/10ML: INTRAMUSCULAR | Qty: 1 | Status: AC

## 2021-02-11 MED FILL — Fosaprepitant Dimeglumine For IV Infusion 150 MG (Base Eq): INTRAVENOUS | Qty: 5 | Status: AC

## 2021-02-12 ENCOUNTER — Ambulatory Visit
Admission: RE | Admit: 2021-02-12 | Discharge: 2021-02-12 | Disposition: A | Discharge status: Home / Self Care | Payer: PPO | Source: Ambulatory Visit | Attending: Radiation Oncology | Admitting: Radiation Oncology

## 2021-02-12 ENCOUNTER — Inpatient Hospital Stay: Payer: PPO

## 2021-02-12 ENCOUNTER — Inpatient Hospital Stay: Payer: PPO | Admitting: Dietician

## 2021-02-12 VITALS — BP 106/77 | HR 78 | Temp 98.2°F | Resp 18 | Ht 67.0 in | Wt 194.1 lb

## 2021-02-12 DIAGNOSIS — C760 Malignant neoplasm of head, face and neck: Secondary | ICD-10-CM

## 2021-02-12 DIAGNOSIS — C770 Secondary and unspecified malignant neoplasm of lymph nodes of head, face and neck: Secondary | ICD-10-CM | POA: Diagnosis not present

## 2021-02-12 DIAGNOSIS — Z5111 Encounter for antineoplastic chemotherapy: Secondary | ICD-10-CM | POA: Diagnosis not present

## 2021-02-12 DIAGNOSIS — R300 Dysuria: Secondary | ICD-10-CM | POA: Diagnosis not present

## 2021-02-12 DIAGNOSIS — Z95828 Presence of other vascular implants and grafts: Secondary | ICD-10-CM

## 2021-02-12 DIAGNOSIS — C801 Malignant (primary) neoplasm, unspecified: Secondary | ICD-10-CM | POA: Diagnosis not present

## 2021-02-12 LAB — CMP (CANCER CENTER ONLY)
ALT: 26 U/L (ref 0–44)
AST: 16 U/L (ref 15–41)
Albumin: 3.4 g/dL — ABNORMAL LOW (ref 3.5–5.0)
Alkaline Phosphatase: 30 U/L — ABNORMAL LOW (ref 38–126)
Anion gap: 7 (ref 5–15)
BUN: 16 mg/dL (ref 8–23)
CO2: 28 mmol/L (ref 22–32)
Calcium: 9.2 mg/dL (ref 8.9–10.3)
Chloride: 104 mmol/L (ref 98–111)
Creatinine: 1.12 mg/dL (ref 0.61–1.24)
GFR, Estimated: >60 mL/min (ref >60)
Glucose, Bld: 96 mg/dL (ref 70–99)
Potassium: 4.4 mmol/L (ref 3.5–5.1)
Sodium: 139 mmol/L (ref 135–145)
Total Bilirubin: 1.2 mg/dL (ref 0.3–1.2)
Total Protein: 6.7 g/dL (ref 6.5–8.1)

## 2021-02-12 LAB — CBC WITH DIFFERENTIAL (CANCER CENTER ONLY)
Abs Immature Granulocytes: 0.03 10*3/uL (ref 0.00–0.07)
Basophils Absolute: 0 10*3/uL (ref 0.0–0.1)
Basophils Relative: 0 %
Eosinophils Absolute: 0.3 10*3/uL (ref 0.0–0.5)
Eosinophils Relative: 4 %
HCT: 40.8 % (ref 39.0–52.0)
Hemoglobin: 13.9 g/dL (ref 13.0–17.0)
Immature Granulocytes: 1 %
Lymphocytes Relative: 9 %
Lymphs Abs: 0.6 10*3/uL — ABNORMAL LOW (ref 0.7–4.0)
MCH: 30.2 pg (ref 26.0–34.0)
MCHC: 34.1 g/dL (ref 30.0–36.0)
MCV: 88.5 fL (ref 80.0–100.0)
Monocytes Absolute: 0.7 10*3/uL (ref 0.1–1.0)
Monocytes Relative: 11 %
Neutro Abs: 4.8 10*3/uL (ref 1.7–7.7)
Neutrophils Relative %: 75 %
Platelet Count: 274 10*3/uL (ref 150–400)
RBC: 4.61 MIL/uL (ref 4.22–5.81)
RDW: 12.5 % (ref 11.5–15.5)
WBC Count: 6.4 10*3/uL (ref 4.0–10.5)
nRBC: 0 % (ref 0.0–0.2)

## 2021-02-12 LAB — MAGNESIUM: Magnesium: 2.1 mg/dL (ref 1.7–2.4)

## 2021-02-12 MED ORDER — SODIUM CHLORIDE 0.9 % IV SOLN
10.0000 mg | Freq: Once | INTRAVENOUS | Status: AC
  Administered 2021-02-12: 10 mg via INTRAVENOUS
  Filled 2021-02-12: qty 10

## 2021-02-12 MED ORDER — OSMOLITE 1.5 CAL PO LIQD: ORAL | Status: DC

## 2021-02-12 MED ORDER — SODIUM CHLORIDE 0.9 % IV SOLN
40.0000 mg/m2 | Freq: Once | INTRAVENOUS | Status: AC
  Administered 2021-02-12: 84 mg via INTRAVENOUS
  Filled 2021-02-12: qty 84

## 2021-02-12 MED ORDER — POTASSIUM CHLORIDE IN NACL 20-0.9 MEQ/L-% IV SOLN
Freq: Once | INTRAVENOUS | Status: AC
  Filled 2021-02-12: qty 1000

## 2021-02-12 MED ORDER — MAGNESIUM SULFATE 2 GM/50ML IV SOLN
2.0000 g | Freq: Once | INTRAVENOUS | Status: AC
  Administered 2021-02-12: 2 g via INTRAVENOUS
  Filled 2021-02-12: qty 50

## 2021-02-12 MED ORDER — SODIUM CHLORIDE 0.9% FLUSH
10.0000 mL | INTRAVENOUS | Status: DC | PRN
  Administered 2021-02-12: 10 mL

## 2021-02-12 MED ORDER — SODIUM CHLORIDE 0.9 % IV SOLN
150.0000 mg | Freq: Once | INTRAVENOUS | Status: AC
  Administered 2021-02-12: 150 mg via INTRAVENOUS
  Filled 2021-02-12: qty 150

## 2021-02-12 MED ORDER — SODIUM CHLORIDE 0.9% FLUSH
10.0000 mL | Freq: Once | INTRAVENOUS | Status: AC
  Administered 2021-02-12: 10 mL

## 2021-02-12 MED ORDER — SODIUM CHLORIDE 0.9 % IV SOLN: Freq: Once | INTRAVENOUS | Status: AC

## 2021-02-12 MED ORDER — PALONOSETRON HCL INJECTION 0.25 MG/5ML
0.2500 mg | Freq: Once | INTRAVENOUS | Status: AC
  Administered 2021-02-12: 0.25 mg via INTRAVENOUS
  Filled 2021-02-12: qty 5

## 2021-02-12 MED ORDER — HEPARIN SOD (PORK) LOCK FLUSH 100 UNIT/ML IV SOLN
500.0000 [IU] | Freq: Once | INTRAVENOUS | Status: AC | PRN
  Administered 2021-02-12: 500 [IU]

## 2021-02-12 NOTE — Progress Notes (Signed)
Urine output 459ml prior to cisplatin .

## 2021-02-12 NOTE — Progress Notes (Signed)
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 He is s/p left neck dissection on 9/12.   Met with patient during infusion. Patient reports tolerating first chemotherapy well until Thursday when he lost his taste, states everything taste horrible. Patient has not tried baking soda salt water rinses. Patient reports difficulty swallowing and sore throat. He is taking small sips of water, reports he did not eat orally yesterday. He reports giving one tube feeding last night and tolerated well. Patient agreeable to completing tube feeding during infusion today. Patient reports mild constipation, he is starting stool softener today. He denies diarrhea, nausea, vomiting.   Medications: reviewed  Labs: reviewed  Anthropometrics: Weight 196.6 lb on 11/7 decreased 5 lbs (2.5%) in the last 7 days. Patient weighed 201.6 lb on 10/31. This is significant for time frame.    Estimated Energy Needs  Kcals: 5732-2025 Protein: 10/-121 Fluid: 2.5 L  Osmolite 1.5 - 7 cartons split over 4 feedings/day. Flush tube with 90 ml water before and after each feeding. Drink by mouth or give via tube additional 2 cups fluid/day. This will provide 2485 kcal, 104.3 g protein, 2467 ml total water.   NUTRITION DIAGNOSIS: Predicted suboptimal intake has evolved into inadequate oral intake related to SCC of head and neck and associated treatments as evidenced by reported dysphagia, dietary recall meeting <25% of estimated needs, and severe 2.5% weight loss in 7 days.    INTERVENTION:  Completed tube feeding with patient during infusion today. Patient tolerated one carton Osmolite 1.5 with 90 ml water flush before and after.  Patient instructed to begin with 1 carton of Osmolite 1.5 QID and increase as tolerated to goal noted above - pt has been provided bolus feeding instructions Encouraged soft, moist high protein foods as tolerated Patient  encouraged to use baking soda, salt water rinses several times daily Patient has contact information [facility] contacted for formula and supplies   MONITORING, EVALUATION, GOAL: weight trends, intake, tube feedings   NEXT VISIT: Tuesday, November 15 during infusion

## 2021-02-12 NOTE — Patient Instructions (Signed)
[facility] MEDICAL [facility]COLOGY  Discharge Instructions: Thank you for choosing [facility] to provide your oncology and hematology care.   If you have a lab appointment with the Cancer Center, please go directly to the Cancer Center and check in at the registration area.   Wear comfortable clothing and clothing appropriate for easy access to any Portacath or PICC line.   We strive to give you quality time with your provider. You may need to reschedule your appointment if you arrive late (15 or more minutes).  Arriving late affects you and other patients whose appointments are after yours.  Also, if you miss three or more appointments without notifying the office, you may be dismissed from the clinic at the provider's discretion.      For prescription refill requests, have your pharmacy contact our office and allow 72 hours for refills to be completed.    Today you received the following chemotherapy and/or immunotherapy agents cisplatin   To help prevent nausea and vomiting after your treatment, we encourage you to take your nausea medication as directed.  BELOW ARE SYMPTOMS THAT SHOULD BE REPORTED IMMEDIATELY: *FEVER GREATER THAN 100.4 F (38 C) OR HIGHER *CHILLS OR SWEATING *NAUSEA AND VOMITING THAT IS NOT CONTROLLED WITH YOUR NAUSEA MEDICATION *UNUSUAL SHORTNESS OF BREATH *UNUSUAL BRUISING OR BLEEDING *URINARY PROBLEMS (pain or burning when urinating, or frequent urination) *BOWEL PROBLEMS (unusual diarrhea, constipation, pain near the anus) TENDERNESS IN MOUTH AND THROAT WITH OR WITHOUT PRESENCE OF ULCERS (sore throat, sores in mouth, or a toothache) UNUSUAL RASH, SWELLING OR PAIN  UNUSUAL VAGINAL DISCHARGE OR ITCHING   Items with * indicate a potential emergency and should be followed up as soon as possible or go to the Emergency Department if any problems should occur.  Please show the CHEMOTHERAPY ALERT CARD or IMMUNOTHERAPY ALERT CARD at check-in to the  Emergency Department and triage nurse.  Should you have questions after your visit or need to cancel or reschedule your appointment, please contact [facility]  Dept: [phone]  and follow the prompts.  Office hours are 8:00 a.m. to 4:30 p.m. Monday - Friday. Please note that voicemails left after 4:00 p.m. may not be returned until the following business day.  We are closed weekends and major holidays. You have access to a nurse at all times for urgent questions. Please call the main number to the clinic Dept: [phone] and follow the prompts.   For any non-urgent questions, you may also contact your provider using MyChart. We now offer e-Visits for anyone 18 and older to request care online for non-urgent symptoms. For details visit [email or web address].   Also download the MyChart app! Go to the app store, search "MyChart", open the app, select [facility], and log in with your MyChart username and password.  Due to Covid, a mask is required upon entering the hospital/clinic. If you do not have a mask, one will be given to you upon arrival. For doctor visits, patients may have 1 support person aged 18 or older with them. For treatment visits, patients cannot have anyone with them due to current Covid guidelines and our immunocompromised population.   

## 2021-02-13 ENCOUNTER — Ambulatory Visit
Admission: RE | Admit: 2021-02-13 | Discharge: 2021-02-13 | Disposition: A | Discharge status: Home / Self Care | Payer: PPO | Source: Ambulatory Visit | Attending: Radiation Oncology | Admitting: Radiation Oncology

## 2021-02-13 ENCOUNTER — Other Ambulatory Visit: Payer: Self-pay

## 2021-02-13 DIAGNOSIS — C760 Malignant neoplasm of head, face and neck: Secondary | ICD-10-CM | POA: Diagnosis not present

## 2021-02-13 DIAGNOSIS — Z931 Gastrostomy status: Secondary | ICD-10-CM | POA: Diagnosis not present

## 2021-02-13 DIAGNOSIS — C801 Malignant (primary) neoplasm, unspecified: Secondary | ICD-10-CM | POA: Diagnosis not present

## 2021-02-13 DIAGNOSIS — Z5111 Encounter for antineoplastic chemotherapy: Secondary | ICD-10-CM | POA: Diagnosis not present

## 2021-02-13 DIAGNOSIS — C770 Secondary and unspecified malignant neoplasm of lymph nodes of head, face and neck: Secondary | ICD-10-CM | POA: Diagnosis not present

## 2021-02-13 DIAGNOSIS — R1310 Dysphagia, unspecified: Secondary | ICD-10-CM | POA: Diagnosis not present

## 2021-02-13 DIAGNOSIS — R300 Dysuria: Secondary | ICD-10-CM | POA: Diagnosis not present

## 2021-02-13 DIAGNOSIS — Z95828 Presence of other vascular implants and grafts: Secondary | ICD-10-CM | POA: Diagnosis not present

## 2021-02-14 ENCOUNTER — Ambulatory Visit
Admission: RE | Admit: 2021-02-14 | Discharge: 2021-02-14 | Disposition: A | Discharge status: Home / Self Care | Payer: PPO | Source: Ambulatory Visit | Attending: Radiation Oncology | Admitting: Radiation Oncology

## 2021-02-14 ENCOUNTER — Encounter (INDEPENDENT_AMBULATORY_CARE_PROVIDER_SITE_OTHER): Payer: PPO | Admitting: Ophthalmology

## 2021-02-14 DIAGNOSIS — C801 Malignant (primary) neoplasm, unspecified: Secondary | ICD-10-CM | POA: Diagnosis not present

## 2021-02-14 DIAGNOSIS — R300 Dysuria: Secondary | ICD-10-CM | POA: Diagnosis not present

## 2021-02-14 DIAGNOSIS — Z5111 Encounter for antineoplastic chemotherapy: Secondary | ICD-10-CM | POA: Diagnosis not present

## 2021-02-14 DIAGNOSIS — C770 Secondary and unspecified malignant neoplasm of lymph nodes of head, face and neck: Secondary | ICD-10-CM | POA: Diagnosis not present

## 2021-02-15 ENCOUNTER — Ambulatory Visit
Admission: RE | Admit: 2021-02-15 | Discharge: 2021-02-15 | Disposition: A | Discharge status: Home / Self Care | Payer: PPO | Source: Ambulatory Visit | Attending: Radiation Oncology | Admitting: Radiation Oncology

## 2021-02-15 ENCOUNTER — Other Ambulatory Visit: Payer: Self-pay

## 2021-02-15 DIAGNOSIS — C801 Malignant (primary) neoplasm, unspecified: Secondary | ICD-10-CM | POA: Diagnosis not present

## 2021-02-15 DIAGNOSIS — C770 Secondary and unspecified malignant neoplasm of lymph nodes of head, face and neck: Secondary | ICD-10-CM | POA: Diagnosis not present

## 2021-02-15 DIAGNOSIS — Z5111 Encounter for antineoplastic chemotherapy: Secondary | ICD-10-CM | POA: Diagnosis not present

## 2021-02-15 DIAGNOSIS — R300 Dysuria: Secondary | ICD-10-CM | POA: Diagnosis not present

## 2021-02-18 ENCOUNTER — Encounter: Payer: Self-pay | Admitting: General Practice

## 2021-02-18 ENCOUNTER — Other Ambulatory Visit: Payer: Self-pay | Admitting: Radiation Oncology

## 2021-02-18 ENCOUNTER — Telehealth: Payer: Self-pay

## 2021-02-18 ENCOUNTER — Inpatient Hospital Stay: Payer: PPO | Admitting: *Deleted

## 2021-02-18 ENCOUNTER — Other Ambulatory Visit: Payer: Self-pay

## 2021-02-18 ENCOUNTER — Ambulatory Visit
Admission: RE | Admit: 2021-02-18 | Discharge: 2021-02-18 | Disposition: A | Discharge status: Home / Self Care | Payer: PPO | Source: Ambulatory Visit | Attending: Radiation Oncology | Admitting: Radiation Oncology

## 2021-02-18 DIAGNOSIS — C801 Malignant (primary) neoplasm, unspecified: Secondary | ICD-10-CM | POA: Diagnosis not present

## 2021-02-18 DIAGNOSIS — C760 Malignant neoplasm of head, face and neck: Secondary | ICD-10-CM

## 2021-02-18 DIAGNOSIS — R300 Dysuria: Secondary | ICD-10-CM

## 2021-02-18 DIAGNOSIS — Z5111 Encounter for antineoplastic chemotherapy: Secondary | ICD-10-CM | POA: Diagnosis not present

## 2021-02-18 DIAGNOSIS — B359 Dermatophytosis, unspecified: Secondary | ICD-10-CM

## 2021-02-18 DIAGNOSIS — C770 Secondary and unspecified malignant neoplasm of lymph nodes of head, face and neck: Secondary | ICD-10-CM | POA: Diagnosis not present

## 2021-02-18 LAB — URINALYSIS, COMPLETE (UACMP) WITH MICROSCOPIC
Bacteria, UA: NONE SEEN
Bilirubin Urine: NEGATIVE
Glucose, UA: NEGATIVE mg/dL
Hgb urine dipstick: NEGATIVE
Ketones, ur: NEGATIVE mg/dL
Leukocytes,Ua: NEGATIVE
Nitrite: NEGATIVE
Protein, ur: NEGATIVE mg/dL
Specific Gravity, Urine: 1.021 (ref 1.005–1.030)
pH: 5 (ref 5.0–8.0)

## 2021-02-18 LAB — CMP (CANCER CENTER ONLY)
ALT: 34 U/L (ref 0–44)
AST: 19 U/L (ref 15–41)
Albumin: 3.4 g/dL — ABNORMAL LOW (ref 3.5–5.0)
Alkaline Phosphatase: 29 U/L — ABNORMAL LOW (ref 38–126)
Anion gap: 8 (ref 5–15)
BUN: 28 mg/dL — ABNORMAL HIGH (ref 8–23)
CO2: 25 mmol/L (ref 22–32)
Calcium: 9.4 mg/dL (ref 8.9–10.3)
Chloride: 105 mmol/L (ref 98–111)
Creatinine: 1.14 mg/dL (ref 0.61–1.24)
GFR, Estimated: >60 mL/min (ref >60)
Glucose, Bld: 113 mg/dL — ABNORMAL HIGH (ref 70–99)
Potassium: 4.8 mmol/L (ref 3.5–5.1)
Sodium: 138 mmol/L (ref 135–145)
Total Bilirubin: 0.8 mg/dL (ref 0.3–1.2)
Total Protein: 6.7 g/dL (ref 6.5–8.1)

## 2021-02-18 LAB — CBC WITH DIFFERENTIAL (CANCER CENTER ONLY)
Abs Immature Granulocytes: 0.03 10*3/uL (ref 0.00–0.07)
Basophils Absolute: 0 10*3/uL (ref 0.0–0.1)
Basophils Relative: 0 %
Eosinophils Absolute: 0.1 10*3/uL (ref 0.0–0.5)
Eosinophils Relative: 1 %
HCT: 41.3 % (ref 39.0–52.0)
Hemoglobin: 13.5 g/dL (ref 13.0–17.0)
Immature Granulocytes: 0 %
Lymphocytes Relative: 6 %
Lymphs Abs: 0.4 10*3/uL — ABNORMAL LOW (ref 0.7–4.0)
MCH: 29.8 pg (ref 26.0–34.0)
MCHC: 32.7 g/dL (ref 30.0–36.0)
MCV: 91.2 fL (ref 80.0–100.0)
Monocytes Absolute: 0.6 10*3/uL (ref 0.1–1.0)
Monocytes Relative: 9 %
Neutro Abs: 6.1 10*3/uL (ref 1.7–7.7)
Neutrophils Relative %: 84 %
Platelet Count: 213 10*3/uL (ref 150–400)
RBC: 4.53 MIL/uL (ref 4.22–5.81)
RDW: 12.9 % (ref 11.5–15.5)
WBC Count: 7.3 10*3/uL (ref 4.0–10.5)
nRBC: 0 % (ref 0.0–0.2)

## 2021-02-18 MED ORDER — CLOTRIMAZOLE 1 % EX CREA: 1.0000 "application " | TOPICAL_CREAM | Freq: Three times a day (TID) | CUTANEOUS | 0 refills | Status: DC

## 2021-02-18 MED FILL — Fosaprepitant Dimeglumine For IV Infusion 150 MG (Base Eq): INTRAVENOUS | Qty: 5 | Status: AC

## 2021-02-18 MED FILL — Dexamethasone Sodium Phosphate Inj 100 MG/10ML: INTRAMUSCULAR | Qty: 1 | Status: AC

## 2021-02-18 NOTE — Telephone Encounter (Signed)
-----   Message from Eppie Gibson, MD sent at 02/18/2021  2:09 PM EST ----- Please let him know to push the fluids - UA appears to confirm dehydration, but no obvious infection  Thanks! ----- Message ----- From: Interface, Lab In [place] Sent: 02/18/2021  12:48 PM EST To: Eppie Gibson, MD

## 2021-02-18 NOTE — Telephone Encounter (Signed)
Called and spoke with patient to relay [place] results from today. Advised patient to push clear fluids, and let his oncology team know if he was struggling with oral intake so we could get him scheduled for supplemental IVFs. Patient verbalized understanding and appreciation of call. No other needs identified at this time

## 2021-02-18 NOTE — Progress Notes (Signed)
[facility]ual [facility] Note  Assisted Tamim with completing his AD in [facility] with a staff notary, Vata Epps, and two volunteer witnesses. Original with patient, copy to HIM for scanning into his chart.   [place], [place], [facility] Pager [phone] Voicemail [phone]

## 2021-02-19 ENCOUNTER — Inpatient Hospital Stay: Payer: PPO

## 2021-02-19 ENCOUNTER — Inpatient Hospital Stay (HOSPITAL_BASED_OUTPATIENT_CLINIC_OR_DEPARTMENT_OTHER): Payer: PPO | Admitting: Oncology

## 2021-02-19 ENCOUNTER — Ambulatory Visit
Admission: RE | Admit: 2021-02-19 | Discharge: 2021-02-19 | Disposition: A | Discharge status: Home / Self Care | Payer: PPO | Source: Ambulatory Visit | Attending: Radiation Oncology | Admitting: Radiation Oncology

## 2021-02-19 ENCOUNTER — Inpatient Hospital Stay: Payer: PPO | Admitting: Dietician

## 2021-02-19 VITALS — BP 106/71 | HR 81 | Temp 97.9°F | Resp 18 | Ht 67.0 in | Wt 194.9 lb

## 2021-02-19 DIAGNOSIS — C760 Malignant neoplasm of head, face and neck: Secondary | ICD-10-CM

## 2021-02-19 DIAGNOSIS — C770 Secondary and unspecified malignant neoplasm of lymph nodes of head, face and neck: Secondary | ICD-10-CM | POA: Diagnosis not present

## 2021-02-19 DIAGNOSIS — C801 Malignant (primary) neoplasm, unspecified: Secondary | ICD-10-CM | POA: Diagnosis not present

## 2021-02-19 DIAGNOSIS — R300 Dysuria: Secondary | ICD-10-CM | POA: Diagnosis not present

## 2021-02-19 DIAGNOSIS — Z5111 Encounter for antineoplastic chemotherapy: Secondary | ICD-10-CM | POA: Diagnosis not present

## 2021-02-19 LAB — URINE CULTURE: Culture: NO GROWTH

## 2021-02-19 MED ORDER — SODIUM CHLORIDE 0.9 % IV SOLN: Freq: Once | INTRAVENOUS | Status: AC

## 2021-02-19 MED ORDER — SODIUM CHLORIDE 0.9 % IV SOLN
10.0000 mg | Freq: Once | INTRAVENOUS | Status: AC
  Administered 2021-02-19: 10 mg via INTRAVENOUS
  Filled 2021-02-19: qty 10

## 2021-02-19 MED ORDER — POTASSIUM CHLORIDE IN NACL 20-0.9 MEQ/L-% IV SOLN
Freq: Once | INTRAVENOUS | Status: AC
  Filled 2021-02-19: qty 1000

## 2021-02-19 MED ORDER — MAGNESIUM SULFATE 2 GM/50ML IV SOLN
2.0000 g | Freq: Once | INTRAVENOUS | Status: AC
  Administered 2021-02-19: 2 g via INTRAVENOUS
  Filled 2021-02-19: qty 50

## 2021-02-19 MED ORDER — SODIUM CHLORIDE 0.9 % IV SOLN
40.0000 mg/m2 | Freq: Once | INTRAVENOUS | Status: AC
  Administered 2021-02-19: 84 mg via INTRAVENOUS
  Filled 2021-02-19: qty 84

## 2021-02-19 MED ORDER — PALONOSETRON HCL INJECTION 0.25 MG/5ML
0.2500 mg | Freq: Once | INTRAVENOUS | Status: AC
  Administered 2021-02-19: 0.25 mg via INTRAVENOUS
  Filled 2021-02-19: qty 5

## 2021-02-19 MED ORDER — SODIUM CHLORIDE 0.9% FLUSH
10.0000 mL | INTRAVENOUS | Status: DC | PRN
  Administered 2021-02-19: 10 mL

## 2021-02-19 MED ORDER — HEPARIN SOD (PORK) LOCK FLUSH 100 UNIT/ML IV SOLN
500.0000 [IU] | Freq: Once | INTRAVENOUS | Status: AC | PRN
  Administered 2021-02-19: 500 [IU]

## 2021-02-19 MED ORDER — SODIUM CHLORIDE 0.9 % IV SOLN
150.0000 mg | Freq: Once | INTRAVENOUS | Status: AC
  Administered 2021-02-19: 150 mg via INTRAVENOUS
  Filled 2021-02-19: qty 150

## 2021-02-19 MED ORDER — SODIUM CHLORIDE 0.9 % IV SOLN: INTRAVENOUS | Status: AC

## 2021-02-19 NOTE — Progress Notes (Signed)
Hematology and Oncology Follow Up Visit  Nicholas Daniels [id number] 25-Apr-1950 70 y.o. 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  He presented with unknown primary, left cervical adenopathy with p16 positive tumor after neck dissection.   Secondary diagnosis: T3N0 Gleason score 3+4 = 7 prostate cancer diagnosed 2009.  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  Prior Therapy:  He underwent prostatectomy and found to have a T3a N0 Gleason score 3+4 = 7 cancer.  He underwent salvage radiation in 2012.   He developed a rise in his PSA and enrolled in the [employer] trial.   He was off enzalutamide since August 2021 and off clinical trial at that time.  His PSA started to rise in the last year with a PSA in May 2022 was 0.56.  Previously was 0.35 and it was undetectable in May 2021.  Prior to enzalutamide his PSA was as high as 8.27 and declined to undetectable level.  He is status post direct laryngoscopy and left neck dissection completed by Dr. Constance Daniels on December 17, 2020.  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   Current therapy: Adjuvant radiation therapy with weekly cisplatin started on February 05, 2021.  He is here for week 3 of therapy.  Interim History: [name] turns today for repeat evaluation.  Since the last visit, he has tolerated treatment without any major complaints.  He has reported some mild pain but overall has not required any pain medication.  Denies any nausea or vomiting or abdominal pain.  His oral intake has diminished and is using PEG tube for feeding predominantly.  His weight is stable.     Medications: Updated on review. Current Outpatient Medications  Medication Sig Dispense Refill   clotrimazole ([employer] CLOTRIMAZOLE) 1 % cream Apply 1 application  topically in the morning, at noon, and at bedtime. Apply to rash on posterior neck. 30 g 0   aspirin 81 MG EC tablet Take 162 mg by mouth at bedtime.     calcium-vitamin D (CALCIUM 500/D) 500-200 MG-UNIT tablet Take 1 tablet by mouth at bedtime.     diltiazem (CARDIZEM CD) 120 MG 24 hr capsule Take 240 mg by mouth at bedtime.     Flaxseed, Linseed, (FLAXSEED OIL) 1200 MG CAPS Take 2,400 mg by mouth at bedtime.     HYDROcodone-acetaminophen (NORCO) 7.5-325 MG tablet Take 1 tablet by mouth every 6 (six) hours as needed for moderate pain. 20 tablet 0   latanoprost (XALATAN) 0.005 % ophthalmic solution Place 1 drop into both eyes at bedtime.     lidocaine (XYLOCAINE) 2 % solution Patient: Mix 1part 2% viscous lidocaine, 1part H20. Swish & swallow 4mL of diluted mixture, 107min before meals and at bedtime, up to QID 200 mL 3   lidocaine-prilocaine (EMLA) cream Apply 1 application topically as needed. 30 g 0   Multiple Vitamin (MULTIVITAMIN) tablet Take 1 tablet by mouth at bedtime.     niacin 500 MG CR capsule Take 500 mg by mouth at bedtime.     Nutritional Supplements (FEEDING SUPPLEMENT, OSMOLITE 1.5 CAL,) LIQD Osmolite 1.5 - Give 7 cartons split over four feedings/day via PEG. Flush tube with 90 ml water before and after each feeding. Drink by mouth or give via tube additional 2 cups fluids/day. This provides 2485 kcal, 104.3 g protein, 2467 ml total  water. Meets 100% estimated needs. 1659 mL    omeprazole (PRILOSEC) 40 MG capsule TAKE 1 CAPSULE BY MOUTH EVERY DAY 90 capsule 3   ondansetron (ZOFRAN ODT) 8 MG disintegrating tablet Take 1 tablet (8 mg total) by mouth every 8 (eight) hours as needed for nausea or vomiting. 20 tablet 0   prochlorperazine (COMPAZINE) 10 MG tablet Take 1 tablet (10 mg total) by mouth every 6 (six) hours as needed for nausea or vomiting. 30 tablet 0   sucralfate (CARAFATE) 1 g tablet TAKE ONE TABLET BY MOUTH TWICE DAILY (Patient taking differently: Take 1 g by mouth at  bedtime.) 60 tablet 0   timolol (TIMOPTIC) 0.5 % ophthalmic solution Place 1 drop into both eyes daily.     Vitamin D, Cholecalciferol, 50 MCG (2000 UT) CAPS Take 2,000 Units by mouth at bedtime.     No current facility-administered medications for this visit.     Allergies: No Known Allergies   Physical Exam: Blood pressure 106/71, pulse 81, temperature 97.9 F (36.6 C), temperature source Temporal, resp. rate 18, height 5\' 7"  (1.702 m), weight 194 lb 14.4 oz (88.4 kg), SpO2 98 %.  ECOG: 1    General appearance: Comfortable appearing without any discomfort Head: Normocephalic without any trauma Oropharynx: Mucous membranes are moist and pink without any thrush or ulcers. Eyes: Pupils are equal and round reactive to light. Lymph nodes: No cervical, supraclavicular, inguinal or axillary lymphadenopathy.   Heart:regular rate and rhythm.  S1 and S2 without leg edema. Lung: Clear without any rhonchi or wheezes.  No dullness to percussion. Abdomin: Soft, nontender, nondistended with good bowel sounds.  No hepatosplenomegaly. Musculoskeletal: No joint deformity or effusion.  Full range of motion noted. Neurological: No deficits noted on motor, sensory and deep tendon reflex exam. Skin: No petechial rash or dryness.  Appeared moist.      Lab Results: Lab Results  Component Value Date   WBC 7.3 02/18/2021   HGB 13.5 02/18/2021   HCT 41.3 02/18/2021   MCV 91.2 02/18/2021   PLT 213 02/18/2021     Chemistry      Component Value Date/Time   NA 138 02/18/2021 1151   K 4.8 02/18/2021 1151   CL 105 02/18/2021 1151   CO2 25 02/18/2021 1151   BUN 28 (H) 02/18/2021 1151   CREATININE 1.14 02/18/2021 1151   CREATININE 1.10 02/13/2015 1545      Component Value Date/Time   CALCIUM 9.4 02/18/2021 1151   ALKPHOS 29 (L) 02/18/2021 1151   AST 19 02/18/2021 1151   ALT 34 02/18/2021 1151   BILITOT 0.8 02/18/2021 1151          Impression and Plan:  Assessment and Plan:       70 year old with:  1.  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   He is currently receiving adjuvant radiation therapy with weekly cisplatin.  Risks and benefits of continuing this treatment were reviewed at this time.  Potential complications including nausea, vomiting, myelosuppression renal failure were reiterated.  He is agreeable to proceed.      2.  Prostate cancer: No additional treatment is recommended at this time given his ongoing treatment for head and neck cancer.  He is experiencing biochemical relapse which will be addressed in the future.  3.  IV access: Port-A-Cath currently in use without any issues.  4.  Nutritional status: PEG tube is in place  and currently receiving nutritional support.  We will add IV hydration on his weekly chemotherapy.  5.  Antiemetics: No nausea or vomiting reported at this time Compazine is available to him.   6.  Follow-up: We will continue to follow weekly for evaluation and weekly cisplatin therapy.  He will have MD follow-up on March 05, 2021.   30  minutes were dedicated to this visit.  The time was spent on reviewing laboratory data, disease status update and outlining future plan of care.      Zola Button, MD 11/15/20228:17 AM

## 2021-02-19 NOTE — Progress Notes (Signed)
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 He is s/p left neck dissection on 9/12.  Met with patient during infusion. He reports mild sore throat. He is not eating orally, everything taste horrible. Patient is taking small sips of water, but this does not taste good. Patient is using baking soda, salt water rinses several times/day. Patient is tolerating tube feedings at goal, reports giving 2 cartons prior to visit. He plans to give 2 more later today. Patient is flushing tube with 90 ml water before and after each feeding. Patient denies nausea, vomiting. He is taking stool softener as needed for constipation. Patient reports stools have been more loose with tube feedings.    Medications: reviewed  Labs: 11/14 - Glucose 113, BUN 28  Anthropometrics: Weight 194.9 lb today slightly decreased from 196.6 lb on 11/7 and 201.6 lb on 10/31.   NUTRITION DIAGNOSIS: Inadequate oral intake continues, pt relying on tube feedings   INTERVENTION:  Continue Osmolite 1.5 - 7 cartons split over four feedings/day. Flush tube with 90 ml water before and after each feeding. Drink by mouth or give via tube additional 2 cups fluids/day. Provides 2485 kcal, 104.3 g protein, 2467 ml total water.  Encouraged oral intake as tolerated Suggested patient try taking oral medications with applesauce for ease of intake Continue baking soda, salt water rinses several times day Patient has contact information     MONITORING, EVALUATION, GOAL: weight trends, intake, tube feeding   NEXT VISIT: Tuesday, November 22 during infusion

## 2021-02-19 NOTE — Addendum Note (Signed)
Addended by: Wyatt Portela on: 02/19/2021 08:59 AM   Modules accepted: Orders

## 2021-02-19 NOTE — Patient Instructions (Signed)
[facility]  Discharge Instructions: Thank you for choosing [facility] to provide your oncology and hematology care.   If you have a lab appointment with the Cancer Center, please go directly to the Cancer Center and check in at the registration area.   Wear comfortable clothing and clothing appropriate for easy access to any Portacath or PICC line.   We strive to give you quality time with your provider. You may need to reschedule your appointment if you arrive late (15 or more minutes).  Arriving late affects you and other patients whose appointments are after yours.  Also, if you miss three or more appointments without notifying the office, you may be dismissed from the clinic at the provider's discretion.      For prescription refill requests, have your pharmacy contact our office and allow 72 hours for refills to be completed.    Today you received the following chemotherapy and/or immunotherapy agents cisplatin   To help prevent nausea and vomiting after your treatment, we encourage you to take your nausea medication as directed.  BELOW ARE SYMPTOMS THAT SHOULD BE REPORTED IMMEDIATELY: *FEVER GREATER THAN 100.4 F (38 C) OR HIGHER *CHILLS OR SWEATING *NAUSEA AND VOMITING THAT IS NOT CONTROLLED WITH YOUR NAUSEA MEDICATION *UNUSUAL SHORTNESS OF BREATH *UNUSUAL BRUISING OR BLEEDING *URINARY PROBLEMS (pain or burning when urinating, or frequent urination) *BOWEL PROBLEMS (unusual diarrhea, constipation, pain near the anus) TENDERNESS IN MOUTH AND THROAT WITH OR WITHOUT PRESENCE OF ULCERS (sore throat, sores in mouth, or a toothache) UNUSUAL RASH, SWELLING OR PAIN  UNUSUAL VAGINAL DISCHARGE OR ITCHING   Items with * indicate a potential emergency and should be followed up as soon as possible or go to the Emergency Department if any problems should occur.  Please show the CHEMOTHERAPY ALERT CARD or IMMUNOTHERAPY ALERT CARD at check-in to the  Emergency Department and triage nurse.  Should you have questions after your visit or need to cancel or reschedule your appointment, please contact [facility]  Dept: [phone]  and follow the prompts.  Office hours are 8:00 a.m. to 4:30 p.m. Monday - Friday. Please note that voicemails left after 4:00 p.m. may not be returned until the following business day.  We are closed weekends and major holidays. You have access to a nurse at all times for urgent questions. Please call the main number to the clinic Dept: [phone] and follow the prompts.   For any non-urgent questions, you may also contact your provider using MyChart. We now offer e-Visits for anyone 18 and older to request care online for non-urgent symptoms. For details visit [email or web address].   Also download the MyChart app! Go to the app store, search "MyChart", open the app, select [facility], and log in with your MyChart username and password.  Due to Covid, a mask is required upon entering the hospital/clinic. If you do not have a mask, one will be given to you upon arrival. For doctor visits, patients may have 1 support person aged 18 or older with them. For treatment visits, patients cannot have anyone with them due to current Covid guidelines and our immunocompromised population.   

## 2021-02-19 NOTE — Progress Notes (Signed)
Ok to use Mag lvl from 02/12/2021 per Dr Alen Blew

## 2021-02-20 ENCOUNTER — Ambulatory Visit
Admission: RE | Admit: 2021-02-20 | Discharge: 2021-02-20 | Disposition: A | Discharge status: Home / Self Care | Payer: PPO | Source: Ambulatory Visit | Attending: Radiation Oncology | Admitting: Radiation Oncology

## 2021-02-20 ENCOUNTER — Other Ambulatory Visit: Payer: Self-pay

## 2021-02-20 DIAGNOSIS — C801 Malignant (primary) neoplasm, unspecified: Secondary | ICD-10-CM | POA: Diagnosis not present

## 2021-02-20 DIAGNOSIS — C770 Secondary and unspecified malignant neoplasm of lymph nodes of head, face and neck: Secondary | ICD-10-CM | POA: Diagnosis not present

## 2021-02-20 DIAGNOSIS — Z5111 Encounter for antineoplastic chemotherapy: Secondary | ICD-10-CM | POA: Diagnosis not present

## 2021-02-20 DIAGNOSIS — R300 Dysuria: Secondary | ICD-10-CM | POA: Diagnosis not present

## 2021-02-21 ENCOUNTER — Ambulatory Visit
Admission: RE | Admit: 2021-02-21 | Discharge: 2021-02-21 | Disposition: A | Discharge status: Home / Self Care | Payer: PPO | Source: Ambulatory Visit | Attending: Radiation Oncology | Admitting: Radiation Oncology

## 2021-02-21 DIAGNOSIS — Z5111 Encounter for antineoplastic chemotherapy: Secondary | ICD-10-CM | POA: Diagnosis not present

## 2021-02-21 DIAGNOSIS — C801 Malignant (primary) neoplasm, unspecified: Secondary | ICD-10-CM | POA: Diagnosis not present

## 2021-02-21 DIAGNOSIS — R300 Dysuria: Secondary | ICD-10-CM | POA: Diagnosis not present

## 2021-02-21 DIAGNOSIS — C770 Secondary and unspecified malignant neoplasm of lymph nodes of head, face and neck: Secondary | ICD-10-CM | POA: Diagnosis not present

## 2021-02-22 ENCOUNTER — Ambulatory Visit
Admission: RE | Admit: 2021-02-22 | Discharge: 2021-02-22 | Disposition: A | Discharge status: Home / Self Care | Payer: PPO | Source: Ambulatory Visit | Attending: Radiation Oncology | Admitting: Radiation Oncology

## 2021-02-22 ENCOUNTER — Other Ambulatory Visit: Payer: Self-pay

## 2021-02-22 DIAGNOSIS — Z5111 Encounter for antineoplastic chemotherapy: Secondary | ICD-10-CM | POA: Diagnosis not present

## 2021-02-22 DIAGNOSIS — R300 Dysuria: Secondary | ICD-10-CM | POA: Diagnosis not present

## 2021-02-22 DIAGNOSIS — C770 Secondary and unspecified malignant neoplasm of lymph nodes of head, face and neck: Secondary | ICD-10-CM | POA: Diagnosis not present

## 2021-02-22 DIAGNOSIS — C801 Malignant (primary) neoplasm, unspecified: Secondary | ICD-10-CM | POA: Diagnosis not present

## 2021-02-23 ENCOUNTER — Other Ambulatory Visit: Payer: Self-pay | Admitting: Gastroenterology

## 2021-02-24 ENCOUNTER — Ambulatory Visit
Admission: RE | Admit: 2021-02-24 | Discharge: 2021-02-24 | Disposition: A | Discharge status: Home / Self Care | Payer: PPO | Source: Ambulatory Visit | Attending: Radiation Oncology | Admitting: Radiation Oncology

## 2021-02-24 DIAGNOSIS — R300 Dysuria: Secondary | ICD-10-CM | POA: Diagnosis not present

## 2021-02-24 DIAGNOSIS — C801 Malignant (primary) neoplasm, unspecified: Secondary | ICD-10-CM | POA: Diagnosis not present

## 2021-02-24 DIAGNOSIS — C770 Secondary and unspecified malignant neoplasm of lymph nodes of head, face and neck: Secondary | ICD-10-CM | POA: Diagnosis not present

## 2021-02-24 DIAGNOSIS — Z5111 Encounter for antineoplastic chemotherapy: Secondary | ICD-10-CM | POA: Diagnosis not present

## 2021-02-25 ENCOUNTER — Ambulatory Visit
Admission: RE | Admit: 2021-02-25 | Discharge: 2021-02-25 | Disposition: A | Discharge status: Home / Self Care | Payer: PPO | Source: Ambulatory Visit | Attending: Radiation Oncology | Admitting: Radiation Oncology

## 2021-02-25 ENCOUNTER — Encounter: Payer: Self-pay | Admitting: Physical Therapy

## 2021-02-25 ENCOUNTER — Other Ambulatory Visit: Payer: Self-pay | Admitting: Radiation Oncology

## 2021-02-25 ENCOUNTER — Ambulatory Visit: Payer: PPO | Admitting: Physical Therapy

## 2021-02-25 ENCOUNTER — Other Ambulatory Visit: Payer: Self-pay

## 2021-02-25 DIAGNOSIS — C770 Secondary and unspecified malignant neoplasm of lymph nodes of head, face and neck: Secondary | ICD-10-CM

## 2021-02-25 DIAGNOSIS — R300 Dysuria: Secondary | ICD-10-CM | POA: Diagnosis not present

## 2021-02-25 DIAGNOSIS — Z483 Aftercare following surgery for neoplasm: Secondary | ICD-10-CM

## 2021-02-25 DIAGNOSIS — R293 Abnormal posture: Secondary | ICD-10-CM

## 2021-02-25 DIAGNOSIS — I890 Lymphedema, not elsewhere classified: Secondary | ICD-10-CM

## 2021-02-25 DIAGNOSIS — Z5111 Encounter for antineoplastic chemotherapy: Secondary | ICD-10-CM | POA: Diagnosis not present

## 2021-02-25 DIAGNOSIS — C801 Malignant (primary) neoplasm, unspecified: Secondary | ICD-10-CM | POA: Diagnosis not present

## 2021-02-25 MED ORDER — GUAIFENESIN-DM 100-10 MG/5ML PO SYRP
5.0000 mL | ORAL_SOLUTION | ORAL | 3 refills | Status: DC | PRN
Start: 2021-02-25 — End: 2021-07-01

## 2021-02-25 MED ORDER — SUCRALFATE 1 G PO TABS: 1.0000 g | ORAL_TABLET | Freq: Two times a day (BID) | ORAL | 1 refills | Status: DC

## 2021-02-25 MED FILL — Dexamethasone Sodium Phosphate Inj 100 MG/10ML: INTRAMUSCULAR | Qty: 1 | Status: AC

## 2021-02-25 MED FILL — Fosaprepitant Dimeglumine For IV Infusion 150 MG (Base Eq): INTRAVENOUS | Qty: 5 | Status: AC

## 2021-02-25 NOTE — Therapy (Signed)
[facility] @ [address] [place], [place], [address] Phone: [phone]   Fax:  [phone]  Physical Therapy Treatment  Patient Details  Name: Nicholas Daniels MRN: [id number] Date of Birth: Jun 25, 1950 Referring Provider (PT): Isidore Moos   Encounter Date: 02/25/2021   PT End of Session - 02/25/21 0857     Visit Number 2    Number of Visits 9    Date for PT Re-Evaluation 03/07/21    PT Start Time 0805    PT Stop Time 0962    PT Time Calculation (min) 52 min    Activity Tolerance Patient tolerated treatment well    Behavior During Therapy [facility] for tasks assessed/performed             Past Medical History:  Diagnosis Date   Cataract    removed bilateraly   Cough    Glaucoma    Hypertension    PFO (patent foramen ovale)    PFO noted on coronary CT 09/05/14   Prostate cancer ([facility]) 2009    Past Surgical History:  Procedure Laterality Date   CATARACT EXTRACTION     bilateral and a wrinkle on retina on the left    CHOLECYSTECTOMY     COLONOSCOPY     DIRECT LARYNGOSCOPY N/A 12/17/2020   Procedure: DIRECT LARYNGOSCOPY WITH BIOPSIES;  Surgeon: Izora Gala, MD;  Location: [facility] [place];  Service: ENT;  Laterality: N/A;   ESOPHAGOSCOPY N/A 12/17/2020   Procedure: ESOPHAGOSCOPY;  Surgeon: Izora Gala, MD;  Location: [facility];  Service: ENT;  Laterality: N/A;   IR GASTROSTOMY TUBE MOD SED  01/28/2021   IR IMAGING GUIDED PORT INSERTION  01/28/2021   PROSTATE SURGERY  2009   radiation treatment     RADICAL NECK DISSECTION Left 12/17/2020   Procedure: LEFT NECK DISSECTION;  Surgeon: Izora Gala, MD;  Location: [facility];  Service: ENT;  Laterality: Left;   RESECTION OF MEDIASTINAL MASS N/A 02/26/2015   Procedure: RESECTION OF MEDIASTINAL MASS;  Surgeon: Ivin Poot, MD;  Location: [facility];  Service: Thoracic;  Laterality: N/A;   STERNOTOMY N/A 02/26/2015   Procedure: STERNOTOMY;  Surgeon: Ivin Poot, MD;  Location: [facility];   Service: Thoracic;  Laterality: N/A;   UPPER GASTROINTESTINAL ENDOSCOPY      There were no vitals filed for this visit.   Subjective Assessment - 02/25/21 0808     Subjective I have so much mucous and I have been coughing a lot. I am doing the osmolyte.    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 The PET demonstrated enlarged level 2 lymph nodes with minimal uptake concerning for neoplasm, 10/05/20 left cervical lymph node biopsy revealed poorly differentiated carcinoma, PSA staining was negative, 12/17/20 left neck level 1-3 lymph node biopsies revealing metastatic SCC in 2 of 15 lymph nodes with extracapsular extension present. CT scan on 12/31/20 demonstrated no chest metastasis, will receive 30 fractions of radiation to his Oropharynx and bilateral neck with weekly cisplatin.  He started on 01/29/21 and will complete on 03/13/21, PAC/PEG 01/28/21    Patient Stated Goals to gain info from providers    Currently in Pain? No/denies    Pain Score 0-No pain  [facility] Adult PT Treatment/Exercise - 02/25/21 0001       Manual Therapy   Manual Therapy Manual Lymphatic Drainage (MLD);Edema management    Edema Management created foam chip pack for pt to wear around head and neck for additional compression    Manual Lymphatic Drainage (MLD) while instructing pt in anatomy and physiology of the lymphatic system and proper technique for correct skin stretch: in supine with head elevated: short neck, 5 diaphragmatic breaths, bilateral axillary nodes, bilateral pectoral nodes, short neck, posterior, lateral and anterior neck moving fluid towards pathways then retracing all steps. Then issued handout to patient to attempt self MLD at home                          PT Long  Term Goals - 02/07/21 0920       PT LONG TERM GOAL #1   Title Pt will be independent in self MLD for long term management of lymphedema.    Time 4    Period Weeks    Status New    Target Date 03/07/21      PT LONG TERM GOAL #2   Title Pt will obtain appropriate compression garment for long term management of lymphedema.    Time 4    Period Weeks    Status New    Target Date 03/07/21                   Plan - 02/25/21 0911     Clinical Impression Statement Began MLD to anterior neck today while educating pt in proper technique and skin stretch. Spent increased time on anterior and left side of neck where swelling is worse. Issued Norton anterior approach handout for pt to practice following at home. Created a foam chip pack for pt to wear around his neck for additional compression. Will assess pt's independence with self MLD at next session.    PT Frequency 2x / week    PT Duration 4 weeks    PT Treatment/Interventions ADLs/Self Care Home Management;Therapeutic exercise;Patient/family education;Manual techniques;Manual lymph drainage;Scar mobilization;Passive range of motion;Taping;Vasopneumatic Device    PT Next Visit Plan continue MLD to head and neck and instruct using Norton anterior approach- assess pt's indep with this, instruct in scar mobilization - once pt is indep can place on hold until completion of radiation and then schedule for a follow up 2 weeks after completion of radiation    PT Home Exercise Plan head and neck ROM exercises, head and neck MLD, wear chip pack as much as possible    Consulted and Agree with Plan of Care Patient             Patient will benefit from skilled therapeutic intervention in order to improve the following deficits and impairments:  Postural dysfunction, Increased fascial restricitons, Decreased range of motion, Decreased scar mobility, Increased edema  Visit Diagnosis: Lymphedema, not elsewhere classified  Aftercare following  surgery for neoplasm  Abnormal posture  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)     Problem List Patient Active Problem List   Diagnosis Date Noted   Port-A-Cath in place 02/05/2021   Head and neck cancer ([place]) 01/01/2021   SPK (superficial punctate keratitis), left 12/13/2020   Dry eyes, bilateral 12/13/2020   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 ([facility]) 11/22/2020   Increasing PSA level after treatment for prostate cancer 11/22/2020   Macular retinoschisis, left 11/08/2020   Left epiretinal membrane 10/17/2019  Right epiretinal membrane 10/17/2019   Early stage nonexudative age-related macular degeneration of both eyes 10/17/2019   Drug-induced gynecomastia 08/14/2015   Mediastinal mass 08/09/2014   History of prostate cancer 08/09/2014   Hypertension 08/09/2014    Manus Gunning, PT 02/25/2021, 9:14 AM  [facility] @ [address] [place], [place], [address] Phone: [phone]   Fax:  [phone]  Name: CHRISTERPHER CLOS MRN: [id number] Date of Birth: 1950/09/15   Manus Gunning, PT 02/25/21 9:14 AM

## 2021-02-26 ENCOUNTER — Inpatient Hospital Stay: Payer: PPO

## 2021-02-26 ENCOUNTER — Inpatient Hospital Stay: Payer: PPO | Admitting: Dietician

## 2021-02-26 ENCOUNTER — Ambulatory Visit
Admission: RE | Admit: 2021-02-26 | Discharge: 2021-02-26 | Disposition: A | Discharge status: Home / Self Care | Payer: PPO | Source: Ambulatory Visit | Attending: Radiation Oncology | Admitting: Radiation Oncology

## 2021-02-26 VITALS — BP 124/81 | HR 70 | Temp 98.7°F | Resp 18 | Wt 198.5 lb

## 2021-02-26 DIAGNOSIS — Z95828 Presence of other vascular implants and grafts: Secondary | ICD-10-CM

## 2021-02-26 DIAGNOSIS — C801 Malignant (primary) neoplasm, unspecified: Secondary | ICD-10-CM | POA: Diagnosis not present

## 2021-02-26 DIAGNOSIS — R300 Dysuria: Secondary | ICD-10-CM | POA: Diagnosis not present

## 2021-02-26 DIAGNOSIS — C760 Malignant neoplasm of head, face and neck: Secondary | ICD-10-CM

## 2021-02-26 DIAGNOSIS — Z5111 Encounter for antineoplastic chemotherapy: Secondary | ICD-10-CM | POA: Diagnosis not present

## 2021-02-26 DIAGNOSIS — C770 Secondary and unspecified malignant neoplasm of lymph nodes of head, face and neck: Secondary | ICD-10-CM | POA: Diagnosis not present

## 2021-02-26 LAB — CBC WITH DIFFERENTIAL (CANCER CENTER ONLY)
Abs Immature Granulocytes: 0.01 10*3/uL (ref 0.00–0.07)
Basophils Absolute: 0 10*3/uL (ref 0.0–0.1)
Basophils Relative: 1 %
Eosinophils Absolute: 0 10*3/uL (ref 0.0–0.5)
Eosinophils Relative: 1 %
HCT: 31.7 % — ABNORMAL LOW (ref 39.0–52.0)
Hemoglobin: 10.7 g/dL — ABNORMAL LOW (ref 13.0–17.0)
Immature Granulocytes: 1 %
Lymphocytes Relative: 8 %
Lymphs Abs: 0.2 10*3/uL — ABNORMAL LOW (ref 0.7–4.0)
MCH: 30.4 pg (ref 26.0–34.0)
MCHC: 33.8 g/dL (ref 30.0–36.0)
MCV: 90.1 fL (ref 80.0–100.0)
Monocytes Absolute: 0.2 10*3/uL (ref 0.1–1.0)
Monocytes Relative: 10 %
Neutro Abs: 1.6 10*3/uL — ABNORMAL LOW (ref 1.7–7.7)
Neutrophils Relative %: 79 %
Platelet Count: 143 10*3/uL — ABNORMAL LOW (ref 150–400)
RBC: 3.52 MIL/uL — ABNORMAL LOW (ref 4.22–5.81)
RDW: 13.1 % (ref 11.5–15.5)
WBC Count: 2 10*3/uL — ABNORMAL LOW (ref 4.0–10.5)
nRBC: 0 % (ref 0.0–0.2)

## 2021-02-26 LAB — CMP (CANCER CENTER ONLY)
ALT: 33 U/L (ref 0–44)
AST: 17 U/L (ref 15–41)
Albumin: 3.2 g/dL — ABNORMAL LOW (ref 3.5–5.0)
Alkaline Phosphatase: 30 U/L — ABNORMAL LOW (ref 38–126)
Anion gap: 8 (ref 5–15)
BUN: 21 mg/dL (ref 8–23)
CO2: 27 mmol/L (ref 22–32)
Calcium: 8.9 mg/dL (ref 8.9–10.3)
Chloride: 104 mmol/L (ref 98–111)
Creatinine: 0.96 mg/dL (ref 0.61–1.24)
GFR, Estimated: >60 mL/min (ref >60)
Glucose, Bld: 106 mg/dL — ABNORMAL HIGH (ref 70–99)
Potassium: 4.8 mmol/L (ref 3.5–5.1)
Sodium: 139 mmol/L (ref 135–145)
Total Bilirubin: 0.9 mg/dL (ref 0.3–1.2)
Total Protein: 6.3 g/dL — ABNORMAL LOW (ref 6.5–8.1)

## 2021-02-26 LAB — MAGNESIUM: Magnesium: 1.8 mg/dL (ref 1.7–2.4)

## 2021-02-26 MED ORDER — SODIUM CHLORIDE 0.9 % IV SOLN
150.0000 mg | Freq: Once | INTRAVENOUS | Status: AC
  Administered 2021-02-26: 150 mg via INTRAVENOUS
  Filled 2021-02-26: qty 150

## 2021-02-26 MED ORDER — PALONOSETRON HCL INJECTION 0.25 MG/5ML
0.2500 mg | Freq: Once | INTRAVENOUS | Status: AC
  Administered 2021-02-26: 0.25 mg via INTRAVENOUS
  Filled 2021-02-26: qty 5

## 2021-02-26 MED ORDER — SODIUM CHLORIDE 0.9 % IV SOLN
40.0000 mg/m2 | Freq: Once | INTRAVENOUS | Status: AC
  Administered 2021-02-26: 84 mg via INTRAVENOUS
  Filled 2021-02-26: qty 84

## 2021-02-26 MED ORDER — MAGNESIUM SULFATE 2 GM/50ML IV SOLN
2.0000 g | Freq: Once | INTRAVENOUS | Status: AC
  Administered 2021-02-26: 2 g via INTRAVENOUS
  Filled 2021-02-26: qty 50

## 2021-02-26 MED ORDER — SODIUM CHLORIDE 0.9 % IV SOLN: Freq: Once | INTRAVENOUS | Status: AC

## 2021-02-26 MED ORDER — SODIUM CHLORIDE 0.9 % IV SOLN
10.0000 mg | Freq: Once | INTRAVENOUS | Status: AC
  Administered 2021-02-26: 10 mg via INTRAVENOUS
  Filled 2021-02-26: qty 10

## 2021-02-26 MED ORDER — POTASSIUM CHLORIDE IN NACL 20-0.9 MEQ/L-% IV SOLN
Freq: Once | INTRAVENOUS | Status: AC
  Filled 2021-02-26: qty 1000

## 2021-02-26 MED ORDER — HEPARIN SOD (PORK) LOCK FLUSH 100 UNIT/ML IV SOLN
500.0000 [IU] | Freq: Once | INTRAVENOUS | Status: AC | PRN
  Administered 2021-02-26: 500 [IU]

## 2021-02-26 MED ORDER — SODIUM CHLORIDE 0.9% FLUSH
10.0000 mL | INTRAVENOUS | Status: DC | PRN
  Administered 2021-02-26: 10 mL

## 2021-02-26 MED ORDER — SODIUM CHLORIDE 0.9% FLUSH
10.0000 mL | Freq: Once | INTRAVENOUS | Status: AC
  Administered 2021-02-26: 10 mL

## 2021-02-26 NOTE — Patient Instructions (Signed)
[facility]  Discharge Instructions: Thank you for choosing [facility] to provide your oncology and hematology care.   If you have a lab appointment with the Cancer Center, please go directly to the Cancer Center and check in at the registration area.   Wear comfortable clothing and clothing appropriate for easy access to any Portacath or PICC line.   We strive to give you quality time with your provider. You may need to reschedule your appointment if you arrive late (15 or more minutes).  Arriving late affects you and other patients whose appointments are after yours.  Also, if you miss three or more appointments without notifying the office, you may be dismissed from the clinic at the provider's discretion.      For prescription refill requests, have your pharmacy contact our office and allow 72 hours for refills to be completed.    Today you received the following chemotherapy and/or immunotherapy agents : Cisplatin    To help prevent nausea and vomiting after your treatment, we encourage you to take your nausea medication as directed.  BELOW ARE SYMPTOMS THAT SHOULD BE REPORTED IMMEDIATELY: *FEVER GREATER THAN 100.4 F (38 C) OR HIGHER *CHILLS OR SWEATING *NAUSEA AND VOMITING THAT IS NOT CONTROLLED WITH YOUR NAUSEA MEDICATION *UNUSUAL SHORTNESS OF BREATH *UNUSUAL BRUISING OR BLEEDING *URINARY PROBLEMS (pain or burning when urinating, or frequent urination) *BOWEL PROBLEMS (unusual diarrhea, constipation, pain near the anus) TENDERNESS IN MOUTH AND THROAT WITH OR WITHOUT PRESENCE OF ULCERS (sore throat, sores in mouth, or a toothache) UNUSUAL RASH, SWELLING OR PAIN  UNUSUAL VAGINAL DISCHARGE OR ITCHING   Items with * indicate a potential emergency and should be followed up as soon as possible or go to the Emergency Department if any problems should occur.  Please show the CHEMOTHERAPY ALERT CARD or IMMUNOTHERAPY ALERT CARD at check-in to  the Emergency Department and triage nurse.  Should you have questions after your visit or need to cancel or reschedule your appointment, please contact [facility]  Dept: [phone]  and follow the prompts.  Office hours are 8:00 a.m. to 4:30 p.m. Monday - Friday. Please note that voicemails left after 4:00 p.m. may not be returned until the following business day.  We are closed weekends and major holidays. You have access to a nurse at all times for urgent questions. Please call the main number to the clinic Dept: [phone] and follow the prompts.   For any non-urgent questions, you may also contact your provider using MyChart. We now offer e-Visits for anyone 18 and older to request care online for non-urgent symptoms. For details visit [email or web address].   Also download the MyChart app! Go to the app store, search "MyChart", open the app, select [facility], and log in with your MyChart username and password.  Due to Covid, a mask is required upon entering the hospital/clinic. If you do not have a mask, one will be given to you upon arrival. For doctor visits, patients may have 1 support person aged 18 or older with them. For treatment visits, patients cannot have anyone with them due to current Covid guidelines and our immunocompromised population.   

## 2021-02-26 NOTE — Progress Notes (Signed)
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 S/p left neck dissection on 9/12.  Met with patient during infusion. He reports sore throat with coughing and thickened saliva. Patient reports he is not sleeping well due to this. He is using baking soda salt water rinses several times/day. He is drinking water by mouth with medications and small sips through out the day. Patient is not eating orally due to "horrible" taste of foods. Patient is tolerating 6 cartons Osmolite 1.5 daily via tube. He denies nausea, vomiting, constipation. He reports some loose stools from tube feeding. Patient reports using more toilet paper which has irritated his bottom.   Medications: Carafate, Robitussin  Labs: Glucose 106  Anthropometrics: Weight 198 lb 8 oz today increased from 194.9 lb on 11/15   11/7 - 196.6 lb 10/31 - 201.6 lb    NUTRITION DIAGNOSIS: Inadequate oral intake continues, pt relying on tube feedings   INTERVENTION:  Continue 6 cartons Osmolite 1.5 split over four feedings/day. Flush tube with 90 ml water before and after each feeding. If weights decrease, patient will increase to goal of 7 cartons/day Encouraged oral intake as tolerated Discussed strategies for dry mouth/thick saliva - handout with tips provided Continue baking soda salt water rinses several times day Patient has contact information    MONITORING, EVALUATION, GOAL: weight trends, intake, tube feeding   NEXT VISIT: Tuesday November 29

## 2021-02-27 ENCOUNTER — Ambulatory Visit
Admission: RE | Admit: 2021-02-27 | Discharge: 2021-02-27 | Disposition: A | Discharge status: Home / Self Care | Payer: PPO | Source: Ambulatory Visit | Attending: Radiation Oncology | Admitting: Radiation Oncology

## 2021-02-27 DIAGNOSIS — C801 Malignant (primary) neoplasm, unspecified: Secondary | ICD-10-CM | POA: Diagnosis not present

## 2021-02-27 DIAGNOSIS — Z5111 Encounter for antineoplastic chemotherapy: Secondary | ICD-10-CM | POA: Diagnosis not present

## 2021-02-27 DIAGNOSIS — C770 Secondary and unspecified malignant neoplasm of lymph nodes of head, face and neck: Secondary | ICD-10-CM | POA: Diagnosis not present

## 2021-02-27 DIAGNOSIS — R300 Dysuria: Secondary | ICD-10-CM | POA: Diagnosis not present

## 2021-03-04 ENCOUNTER — Other Ambulatory Visit: Payer: Self-pay

## 2021-03-04 ENCOUNTER — Inpatient Hospital Stay: Payer: PPO

## 2021-03-04 ENCOUNTER — Other Ambulatory Visit (HOSPITAL_COMMUNITY): Payer: Self-pay

## 2021-03-04 ENCOUNTER — Ambulatory Visit
Admission: RE | Admit: 2021-03-04 | Discharge: 2021-03-04 | Disposition: A | Discharge status: Home / Self Care | Payer: PPO | Source: Ambulatory Visit | Attending: Radiation Oncology | Admitting: Radiation Oncology

## 2021-03-04 DIAGNOSIS — I4892 Unspecified atrial flutter: Secondary | ICD-10-CM | POA: Diagnosis present

## 2021-03-04 DIAGNOSIS — C770 Secondary and unspecified malignant neoplasm of lymph nodes of head, face and neck: Secondary | ICD-10-CM

## 2021-03-04 DIAGNOSIS — N179 Acute kidney failure, unspecified: Secondary | ICD-10-CM | POA: Diagnosis present

## 2021-03-04 DIAGNOSIS — R000 Tachycardia, unspecified: Secondary | ICD-10-CM | POA: Diagnosis not present

## 2021-03-04 DIAGNOSIS — I272 Pulmonary hypertension, unspecified: Secondary | ICD-10-CM | POA: Diagnosis present

## 2021-03-04 DIAGNOSIS — Z931 Gastrostomy status: Secondary | ICD-10-CM

## 2021-03-04 DIAGNOSIS — E860 Dehydration: Secondary | ICD-10-CM | POA: Diagnosis present

## 2021-03-04 DIAGNOSIS — Z7982 Long term (current) use of aspirin: Secondary | ICD-10-CM

## 2021-03-04 DIAGNOSIS — Q2112 Patent foramen ovale: Secondary | ICD-10-CM

## 2021-03-04 DIAGNOSIS — K219 Gastro-esophageal reflux disease without esophagitis: Secondary | ICD-10-CM | POA: Diagnosis present

## 2021-03-04 DIAGNOSIS — C801 Malignant (primary) neoplasm, unspecified: Secondary | ICD-10-CM | POA: Diagnosis not present

## 2021-03-04 DIAGNOSIS — H409 Unspecified glaucoma: Secondary | ICD-10-CM | POA: Diagnosis present

## 2021-03-04 DIAGNOSIS — I4891 Unspecified atrial fibrillation: Principal | ICD-10-CM | POA: Diagnosis present

## 2021-03-04 DIAGNOSIS — R1310 Dysphagia, unspecified: Secondary | ICD-10-CM | POA: Diagnosis present

## 2021-03-04 DIAGNOSIS — Z8546 Personal history of malignant neoplasm of prostate: Secondary | ICD-10-CM

## 2021-03-04 DIAGNOSIS — Z79899 Other long term (current) drug therapy: Secondary | ICD-10-CM

## 2021-03-04 DIAGNOSIS — D61810 Antineoplastic chemotherapy induced pancytopenia: Secondary | ICD-10-CM | POA: Diagnosis present

## 2021-03-04 DIAGNOSIS — Z9221 Personal history of antineoplastic chemotherapy: Secondary | ICD-10-CM

## 2021-03-04 DIAGNOSIS — T451X5A Adverse effect of antineoplastic and immunosuppressive drugs, initial encounter: Secondary | ICD-10-CM | POA: Diagnosis present

## 2021-03-04 DIAGNOSIS — I4510 Unspecified right bundle-branch block: Secondary | ICD-10-CM | POA: Diagnosis present

## 2021-03-04 DIAGNOSIS — Z923 Personal history of irradiation: Secondary | ICD-10-CM

## 2021-03-04 DIAGNOSIS — Z20822 Contact with and (suspected) exposure to covid-19: Secondary | ICD-10-CM | POA: Diagnosis present

## 2021-03-04 DIAGNOSIS — K5900 Constipation, unspecified: Secondary | ICD-10-CM | POA: Diagnosis present

## 2021-03-04 DIAGNOSIS — C760 Malignant neoplasm of head, face and neck: Secondary | ICD-10-CM | POA: Diagnosis present

## 2021-03-04 DIAGNOSIS — D701 Agranulocytosis secondary to cancer chemotherapy: Secondary | ICD-10-CM | POA: Diagnosis present

## 2021-03-04 DIAGNOSIS — Z8249 Family history of ischemic heart disease and other diseases of the circulatory system: Secondary | ICD-10-CM

## 2021-03-04 DIAGNOSIS — I2510 Atherosclerotic heart disease of native coronary artery without angina pectoris: Secondary | ICD-10-CM | POA: Diagnosis present

## 2021-03-04 DIAGNOSIS — I10 Essential (primary) hypertension: Secondary | ICD-10-CM | POA: Diagnosis present

## 2021-03-04 MED ORDER — SODIUM CHLORIDE 0.9 % IV SOLN: INTRAVENOUS | Status: AC

## 2021-03-04 MED ORDER — HEPARIN SOD (PORK) LOCK FLUSH 100 UNIT/ML IV SOLN
500.0000 [IU] | Freq: Once | INTRAVENOUS | Status: AC
  Administered 2021-03-04: 500 [IU] via INTRAVENOUS

## 2021-03-04 MED ORDER — SODIUM CHLORIDE 0.9% FLUSH
10.0000 mL | Freq: Once | INTRAVENOUS | Status: AC
  Administered 2021-03-04: 10 mL via INTRAVENOUS

## 2021-03-04 NOTE — Patient Instructions (Signed)

## 2021-03-04 NOTE — Progress Notes (Signed)
Patient noted to be tachycardic and reported significant episode of diarrhea yesterday. Plan for 1L-NS over 2 hours. Dr. Alen Blew updated and declined additional lab work since patient will have labs and MD visit tomorrow before planned chemo treatment. PAC accessed and IVFs started around 12:35. Patient escorted to infusion section/nurse via wheelchair to finish IVF infusion.   Vitals:   03/04/21 1200  BP: (!) 116/92  Pulse: (!) 120  Resp: 20  Temp: 97.8 F (36.6 C)  SpO2: 99%

## 2021-03-05 ENCOUNTER — Other Ambulatory Visit: Payer: Self-pay

## 2021-03-05 ENCOUNTER — Inpatient Hospital Stay: Payer: PPO | Admitting: Dietician

## 2021-03-05 ENCOUNTER — Encounter (HOSPITAL_COMMUNITY): Payer: Self-pay | Admitting: Internal Medicine

## 2021-03-05 ENCOUNTER — Inpatient Hospital Stay (HOSPITAL_BASED_OUTPATIENT_CLINIC_OR_DEPARTMENT_OTHER): Payer: PPO | Admitting: Oncology

## 2021-03-05 ENCOUNTER — Inpatient Hospital Stay: Payer: PPO

## 2021-03-05 ENCOUNTER — Inpatient Hospital Stay (HOSPITAL_COMMUNITY)
Admission: EM | Admit: 2021-03-05 | Discharge: 2021-03-12 | DRG: 308 | Disposition: A | Discharge status: Home / Self Care | Payer: PPO | Attending: Internal Medicine | Admitting: Internal Medicine

## 2021-03-05 ENCOUNTER — Ambulatory Visit
Admission: RE | Admit: 2021-03-05 | Discharge: 2021-03-05 | Disposition: A | Discharge status: Home / Self Care | Payer: PPO | Source: Ambulatory Visit | Attending: Radiation Oncology | Admitting: Radiation Oncology

## 2021-03-05 ENCOUNTER — Emergency Department (HOSPITAL_COMMUNITY): Payer: PPO

## 2021-03-05 ENCOUNTER — Other Ambulatory Visit: Payer: PPO

## 2021-03-05 VITALS — HR 163

## 2021-03-05 VITALS — BP 154/98 | HR 164 | Temp 98.6°F | Resp 18 | Wt 198.4 lb

## 2021-03-05 DIAGNOSIS — Z79899 Other long term (current) drug therapy: Secondary | ICD-10-CM

## 2021-03-05 DIAGNOSIS — I272 Pulmonary hypertension, unspecified: Secondary | ICD-10-CM | POA: Diagnosis present

## 2021-03-05 DIAGNOSIS — Q2112 Patent foramen ovale: Secondary | ICD-10-CM | POA: Diagnosis not present

## 2021-03-05 DIAGNOSIS — K219 Gastro-esophageal reflux disease without esophagitis: Secondary | ICD-10-CM | POA: Diagnosis present

## 2021-03-05 DIAGNOSIS — Z20822 Contact with and (suspected) exposure to covid-19: Secondary | ICD-10-CM | POA: Diagnosis present

## 2021-03-05 DIAGNOSIS — R000 Tachycardia, unspecified: Secondary | ICD-10-CM | POA: Diagnosis not present

## 2021-03-05 DIAGNOSIS — I4892 Unspecified atrial flutter: Secondary | ICD-10-CM | POA: Diagnosis present

## 2021-03-05 DIAGNOSIS — N179 Acute kidney failure, unspecified: Secondary | ICD-10-CM | POA: Diagnosis present

## 2021-03-05 DIAGNOSIS — I10 Essential (primary) hypertension: Secondary | ICD-10-CM | POA: Diagnosis present

## 2021-03-05 DIAGNOSIS — Z8249 Family history of ischemic heart disease and other diseases of the circulatory system: Secondary | ICD-10-CM | POA: Diagnosis not present

## 2021-03-05 DIAGNOSIS — R0902 Hypoxemia: Secondary | ICD-10-CM

## 2021-03-05 DIAGNOSIS — C760 Malignant neoplasm of head, face and neck: Secondary | ICD-10-CM

## 2021-03-05 DIAGNOSIS — I4510 Unspecified right bundle-branch block: Secondary | ICD-10-CM | POA: Diagnosis present

## 2021-03-05 DIAGNOSIS — E860 Dehydration: Secondary | ICD-10-CM | POA: Diagnosis present

## 2021-03-05 DIAGNOSIS — D61818 Other pancytopenia: Secondary | ICD-10-CM | POA: Diagnosis not present

## 2021-03-05 DIAGNOSIS — I4891 Unspecified atrial fibrillation: Secondary | ICD-10-CM | POA: Diagnosis not present

## 2021-03-05 DIAGNOSIS — Z7982 Long term (current) use of aspirin: Secondary | ICD-10-CM | POA: Diagnosis not present

## 2021-03-05 DIAGNOSIS — Z95828 Presence of other vascular implants and grafts: Secondary | ICD-10-CM

## 2021-03-05 DIAGNOSIS — R1310 Dysphagia, unspecified: Secondary | ICD-10-CM | POA: Diagnosis present

## 2021-03-05 DIAGNOSIS — Z8546 Personal history of malignant neoplasm of prostate: Secondary | ICD-10-CM

## 2021-03-05 DIAGNOSIS — R638 Other symptoms and signs concerning food and fluid intake: Secondary | ICD-10-CM | POA: Diagnosis not present

## 2021-03-05 DIAGNOSIS — Z923 Personal history of irradiation: Secondary | ICD-10-CM

## 2021-03-05 DIAGNOSIS — I2510 Atherosclerotic heart disease of native coronary artery without angina pectoris: Secondary | ICD-10-CM | POA: Diagnosis present

## 2021-03-05 DIAGNOSIS — H409 Unspecified glaucoma: Secondary | ICD-10-CM | POA: Diagnosis present

## 2021-03-05 DIAGNOSIS — Z9221 Personal history of antineoplastic chemotherapy: Secondary | ICD-10-CM

## 2021-03-05 DIAGNOSIS — K5900 Constipation, unspecified: Secondary | ICD-10-CM | POA: Diagnosis present

## 2021-03-05 DIAGNOSIS — Z931 Gastrostomy status: Secondary | ICD-10-CM

## 2021-03-05 DIAGNOSIS — D701 Agranulocytosis secondary to cancer chemotherapy: Secondary | ICD-10-CM | POA: Diagnosis present

## 2021-03-05 DIAGNOSIS — T451X5A Adverse effect of antineoplastic and immunosuppressive drugs, initial encounter: Secondary | ICD-10-CM | POA: Diagnosis present

## 2021-03-05 DIAGNOSIS — D61810 Antineoplastic chemotherapy induced pancytopenia: Secondary | ICD-10-CM | POA: Diagnosis present

## 2021-03-05 DIAGNOSIS — C801 Malignant (primary) neoplasm, unspecified: Secondary | ICD-10-CM | POA: Diagnosis not present

## 2021-03-05 DIAGNOSIS — R112 Nausea with vomiting, unspecified: Secondary | ICD-10-CM | POA: Diagnosis not present

## 2021-03-05 DIAGNOSIS — C770 Secondary and unspecified malignant neoplasm of lymph nodes of head, face and neck: Secondary | ICD-10-CM | POA: Diagnosis not present

## 2021-03-05 HISTORY — DX: Malignant neoplasm of head, face and neck: C76.0

## 2021-03-05 HISTORY — DX: Atherosclerotic heart disease of native coronary artery without angina pectoris: I25.10

## 2021-03-05 HISTORY — DX: Essential (primary) hypertension: I10

## 2021-03-05 LAB — CMP (CANCER CENTER ONLY)
ALT: 32 U/L (ref 0–44)
AST: 17 U/L (ref 15–41)
Albumin: 3.3 g/dL — ABNORMAL LOW (ref 3.5–5.0)
Alkaline Phosphatase: 29 U/L — ABNORMAL LOW (ref 38–126)
Anion gap: 6 (ref 5–15)
BUN: 27 mg/dL — ABNORMAL HIGH (ref 8–23)
CO2: 26 mmol/L (ref 22–32)
Calcium: 8.4 mg/dL — ABNORMAL LOW (ref 8.9–10.3)
Chloride: 103 mmol/L (ref 98–111)
Creatinine: 1.06 mg/dL (ref 0.61–1.24)
GFR, Estimated: >60 mL/min (ref >60)
Glucose, Bld: 111 mg/dL — ABNORMAL HIGH (ref 70–99)
Potassium: 4.3 mmol/L (ref 3.5–5.1)
Sodium: 135 mmol/L (ref 135–145)
Total Bilirubin: 0.9 mg/dL (ref 0.3–1.2)
Total Protein: 6.2 g/dL — ABNORMAL LOW (ref 6.5–8.1)

## 2021-03-05 LAB — CBC WITH DIFFERENTIAL (CANCER CENTER ONLY)
Abs Immature Granulocytes: 0 10*3/uL (ref 0.00–0.07)
Basophils Absolute: 0 10*3/uL (ref 0.0–0.1)
Basophils Relative: 0 %
Eosinophils Absolute: 0 10*3/uL (ref 0.0–0.5)
Eosinophils Relative: 1 %
HCT: 29.1 % — ABNORMAL LOW (ref 39.0–52.0)
Hemoglobin: 9.9 g/dL — ABNORMAL LOW (ref 13.0–17.0)
Immature Granulocytes: 0 %
Lymphocytes Relative: 13 %
Lymphs Abs: 0.1 10*3/uL — ABNORMAL LOW (ref 0.7–4.0)
MCH: 31.2 pg (ref 26.0–34.0)
MCHC: 34 g/dL (ref 30.0–36.0)
MCV: 91.8 fL (ref 80.0–100.0)
Monocytes Absolute: 0.1 10*3/uL (ref 0.1–1.0)
Monocytes Relative: 12 %
Neutro Abs: 0.8 10*3/uL — ABNORMAL LOW (ref 1.7–7.7)
Neutrophils Relative %: 74 %
Platelet Count: 117 10*3/uL — ABNORMAL LOW (ref 150–400)
RBC: 3.17 MIL/uL — ABNORMAL LOW (ref 4.22–5.81)
RDW: 13.9 % (ref 11.5–15.5)
WBC Count: 1.1 10*3/uL — ABNORMAL LOW (ref 4.0–10.5)
nRBC: 0 % (ref 0.0–0.2)

## 2021-03-05 LAB — CBC WITH DIFFERENTIAL/PLATELET
Abs Immature Granulocytes: 0 K/uL (ref 0.00–0.07)
Band Neutrophils: 5 %
Basophils Absolute: 0 K/uL (ref 0.0–0.1)
Basophils Relative: 0 %
Eosinophils Absolute: 0 K/uL (ref 0.0–0.5)
Eosinophils Relative: 0 %
HCT: 29.1 % — ABNORMAL LOW (ref 39.0–52.0)
Hemoglobin: 9.7 g/dL — ABNORMAL LOW (ref 13.0–17.0)
Lymphocytes Relative: 14 %
Lymphs Abs: 0.1 K/uL — ABNORMAL LOW (ref 0.7–4.0)
MCH: 31.2 pg (ref 26.0–34.0)
MCHC: 33.3 g/dL (ref 30.0–36.0)
MCV: 93.6 fL (ref 80.0–100.0)
Monocytes Absolute: 0 K/uL — ABNORMAL LOW (ref 0.1–1.0)
Monocytes Relative: 6 %
Neutro Abs: 0.6 K/uL — ABNORMAL LOW (ref 1.7–7.7)
Neutrophils Relative %: 75 %
Platelets: 95 K/uL — ABNORMAL LOW (ref 150–400)
RBC: 3.11 MIL/uL — ABNORMAL LOW (ref 4.22–5.81)
RDW: 14.3 % (ref 11.5–15.5)
WBC: 0.8 K/uL — CL (ref 4.0–10.5)
nRBC: 0 % (ref 0.0–0.2)

## 2021-03-05 LAB — COMPREHENSIVE METABOLIC PANEL WITH GFR
ALT: 29 U/L (ref 0–44)
AST: 17 U/L (ref 15–41)
Albumin: 3.1 g/dL — ABNORMAL LOW (ref 3.5–5.0)
Alkaline Phosphatase: 24 U/L — ABNORMAL LOW (ref 38–126)
Anion gap: 5 (ref 5–15)
BUN: 23 mg/dL (ref 8–23)
CO2: 27 mmol/L (ref 22–32)
Calcium: 8.1 mg/dL — ABNORMAL LOW (ref 8.9–10.3)
Chloride: 105 mmol/L (ref 98–111)
Creatinine, Ser: 0.78 mg/dL (ref 0.61–1.24)
GFR, Estimated: >60 mL/min
Glucose, Bld: 141 mg/dL — ABNORMAL HIGH (ref 70–99)
Potassium: 4.4 mmol/L (ref 3.5–5.1)
Sodium: 137 mmol/L (ref 135–145)
Total Bilirubin: 0.5 mg/dL (ref 0.3–1.2)
Total Protein: 6 g/dL — ABNORMAL LOW (ref 6.5–8.1)

## 2021-03-05 LAB — TROPONIN I (HIGH SENSITIVITY)
Troponin I (High Sensitivity): 11 ng/L (ref <18)
Troponin I (High Sensitivity): 12 ng/L (ref <18)

## 2021-03-05 LAB — RESP PANEL BY RT-PCR (FLU A&B, COVID) ARPGX2
Influenza A by PCR: NEGATIVE
Influenza B by PCR: NEGATIVE
SARS Coronavirus 2 by RT PCR: NEGATIVE

## 2021-03-05 LAB — MAGNESIUM: Magnesium: 1.9 mg/dL (ref 1.7–2.4)

## 2021-03-05 LAB — APTT: aPTT: 20 seconds — ABNORMAL LOW (ref 24–36)

## 2021-03-05 MED ORDER — SODIUM CHLORIDE 0.9% FLUSH: 10.0000 mL | Freq: Once | INTRAVENOUS | Status: DC | PRN

## 2021-03-05 MED ORDER — SODIUM CHLORIDE 0.9% FLUSH
10.0000 mL | Freq: Once | INTRAVENOUS | Status: AC
Start: 2021-03-05 — End: 2021-03-05
  Administered 2021-03-05: 10 mL

## 2021-03-05 MED ORDER — LANSOPRAZOLE 3 MG/ML SUSP: 15.0000 mg | Freq: Every day | ORAL | Status: DC

## 2021-03-05 MED ORDER — PANTOPRAZOLE 2 MG/ML SUSPENSION
40.0000 mg | Freq: Every day | ORAL | Status: DC
  Administered 2021-03-06 – 2021-03-12 (×7): 40 mg
  Filled 2021-03-05 (×7): qty 20

## 2021-03-05 MED ORDER — DILTIAZEM HCL-DEXTROSE 125-5 MG/125ML-% IV SOLN (PREMIX)
5.0000 mg/h | INTRAVENOUS | Status: DC
  Administered 2021-03-05: 5 mg/h via INTRAVENOUS
  Administered 2021-03-06 – 2021-03-07 (×5): 10 mg/h via INTRAVENOUS
  Administered 2021-03-08 – 2021-03-10 (×6): 15 mg/h via INTRAVENOUS
  Filled 2021-03-05 (×12): qty 125

## 2021-03-05 MED ORDER — ADENOSINE 6 MG/2ML IV SOLN: 6.0000 mg | Freq: Once | INTRAVENOUS | Status: DC

## 2021-03-05 MED ORDER — HEPARIN SOD (PORK) LOCK FLUSH 100 UNIT/ML IV SOLN: 500.0000 [IU] | Freq: Once | INTRAVENOUS | Status: DC | PRN

## 2021-03-05 MED ORDER — DILTIAZEM HCL 25 MG/5ML IV SOLN
10.0000 mg | Freq: Once | INTRAVENOUS | Status: AC
  Administered 2021-03-05: 10 mg via INTRAVENOUS
  Filled 2021-03-05 (×2): qty 5

## 2021-03-05 MED ORDER — PHENOL 1.4 % MT LIQD: 1.0000 | OROMUCOSAL | Status: DC | PRN

## 2021-03-05 MED ORDER — HEPARIN (PORCINE) 25000 UT/250ML-% IV SOLN
INTRAVENOUS | Status: AC
  Administered 2021-03-05: 1250 [IU]/h via INTRAVENOUS
  Filled 2021-03-05: qty 250

## 2021-03-05 MED ORDER — HEPARIN (PORCINE) 25000 UT/250ML-% IV SOLN
1250.0000 [IU]/h | INTRAVENOUS | Status: DC
  Filled 2021-03-05: qty 250

## 2021-03-05 MED ORDER — SODIUM CHLORIDE 0.9 % IV BOLUS
500.0000 mL | Freq: Once | INTRAVENOUS | Status: AC
  Administered 2021-03-05: 500 mL via INTRAVENOUS

## 2021-03-05 MED ORDER — SODIUM CHLORIDE 0.9 % IV SOLN: Freq: Once | INTRAVENOUS | Status: AC

## 2021-03-05 NOTE — Progress Notes (Signed)
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 S/p left neck dissection 9/12. PEG placement on 10/24.   Met with patient during infusion for IV fluids. Chemotherapy held today due to neutropenia. Patient reports persistent thick saliva, states this makes him nauseas and had 2 episodes of vomiting over the weekend. He has not been taking nausea medication as prescribed. Patient reports pain and soreness in the back of his mouth. He is using baking soda/salt water as well as lidocaine rinses. Patient reports one episode of diarrhea on Saturday, he did not make it to the restroom in time. Patient reports this occurred approximately one hour after tube feeding. He has not had any further episodes. Patient is giving 6 cartons of Osmolite 1.5 via PEG. He has given 2 this morning. Patient is not eating orally, he is asking when taste buds will return. Patient is taking medications with sips of water by mouth.    Medications: Zofran ODT, Compazine  Labs: Glucose 111 BUN 27, WBC 1.1  Anthropometrics: Weight 198 lb 6.4 oz today stable. Patient weighed 198 lb 8 oz on 11/22 increased from 194.9 lb on 11/15  11/7 - 196.6 lb  10/31 - 201.6 lb    NUTRITION DIAGNOSIS: Inadequate oral intake continues, pt relying on tube feedings   INTERVENTION:  Continue 6 cartons Osmolite 1.5 split over four feedings/day. Flush tube with 90 ml water before and after each feeding Reviewed strategies for thick saliva - pt has handout Educated to take nausea medication as prescribed Continue baking soda salt water rinses several times daily Provided support and encouragement Patient has contact information     MONITORING, EVALUATION, GOAL: weight trends, intake, tube feeding   NEXT VISIT: Monday December 5 during infusion with Pamala Hurry

## 2021-03-05 NOTE — Patient Instructions (Signed)

## 2021-03-05 NOTE — ED Provider Notes (Signed)
[facility] DEPT Provider Note   CSN: [id number] Arrival date & time: 03/05/21  1158     History Chief Complaint  Patient presents with   Tachycardia    Nicholas Daniels is a 70 y.o. male.  70 year old male with prior medical history as detailed below presents for evaluation.  Patient reports that he was at the cancer center this morning for treatment.  Patient was noted to have an elevated heart rate into the 160's.  Patient reports that he was not feeling ill.  Patient denies of sensation of palpitations.  He denies chest pain.  He denies shortness of breath.  He reports that when he arrived for his treatment he was placed on a monitor and his tachycardia was noticed at that point.  Patient denies prior history of SVT, atrial fibrillation, or other arrhythmia.  The history is provided by the patient.  Illness Location:  Rapid heart rate Severity:  Moderate Onset quality:  Unable to specify Timing:  Unable to specify Progression:  Unable to specify Chronicity:  New     Past Medical History:  Diagnosis Date   Cataract    removed bilateraly   Cough    Glaucoma    Hypertension    PFO (patent foramen ovale)    PFO noted on coronary CT 09/05/14   Prostate cancer ([place]) 2009    Patient Active Problem List   Diagnosis Date Noted   Port-A-Cath in place 02/05/2021   Head and neck cancer ([place]) 01/01/2021   SPK (superficial punctate keratitis), left 12/13/2020   Dry eyes, bilateral 12/13/2020   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   Increasing PSA level after treatment for prostate cancer 11/22/2020   Macular retinoschisis, left 11/08/2020   Left epiretinal membrane 10/17/2019   Right epiretinal membrane 10/17/2019   Early stage nonexudative age-related macular degeneration of both eyes 10/17/2019   Drug-induced gynecomastia 08/14/2015   Mediastinal mass 08/09/2014   History of prostate cancer 08/09/2014    Hypertension 08/09/2014    Past Surgical History:  Procedure Laterality Date   CATARACT EXTRACTION     bilateral and a wrinkle on retina on the left    CHOLECYSTECTOMY     COLONOSCOPY     DIRECT LARYNGOSCOPY N/A 12/17/2020   Procedure: Experiment;  Surgeon: Izora Gala, MD;  Location: [facility];  Service: ENT;  Laterality: N/A;   ESOPHAGOSCOPY N/A 12/17/2020   Procedure: ESOPHAGOSCOPY;  Surgeon: Izora Gala, MD;  Location: [facility];  Service: ENT;  Laterality: N/A;   IR GASTROSTOMY TUBE MOD SED  01/28/2021   IR IMAGING GUIDED PORT INSERTION  01/28/2021   PROSTATE SURGERY  2009   radiation treatment     RADICAL NECK DISSECTION Left 12/17/2020   Procedure: LEFT NECK DISSECTION;  Surgeon: Izora Gala, MD;  Location: [facility];  Service: ENT;  Laterality: Left;   RESECTION OF MEDIASTINAL MASS N/A 02/26/2015   Procedure: RESECTION OF MEDIASTINAL MASS;  Surgeon: Ivin Poot, MD;  Location: [facility];  Service: Thoracic;  Laterality: N/A;   STERNOTOMY N/A 02/26/2015   Procedure: STERNOTOMY;  Surgeon: Ivin Poot, MD;  Location: [facility] OR;  Service: Thoracic;  Laterality: N/A;   UPPER GASTROINTESTINAL ENDOSCOPY         Family History  Problem Relation Age of Onset   Heart disease Brother    COPD Brother    Cancer Brother        liver   Liver  cancer Brother    Heart disease Mother        MI   COPD Mother    Pneumonia Mother    Diabetes Mother    COPD Father    Colon polyps Other    Esophageal cancer Neg Hx    Rectal cancer Neg Hx    Stomach cancer Neg Hx     Social History   Tobacco Use   Smoking status: Never    Passive exposure: Past   Smokeless tobacco: Never  Vaping Use   Vaping Use: Never used  Substance Use Topics   Alcohol use: No   Drug use: No    Home Medications Prior to Admission medications   Medication Sig Start Date End Date Taking? Authorizing Provider  clotrimazole ([employer] CLOTRIMAZOLE) 1 % cream Apply 1 application topically in the  morning, at noon, and at bedtime. Apply to rash on posterior neck. 02/18/21   Eppie Gibson, MD  aspirin 81 MG EC tablet Take 162 mg by mouth at bedtime.    [provider]  calcium-vitamin D (CALCIUM 500/D) 500-200 MG-UNIT tablet Take 1 tablet by mouth at bedtime.    [provider]  diltiazem (CARDIZEM CD) 120 MG 24 hr capsule Take 240 mg by mouth at bedtime.    [provider]  Flaxseed, Linseed, (FLAXSEED OIL) 1200 MG CAPS Take 2,400 mg by mouth at bedtime.    [provider]  guaiFENesin-dextromethorphan (ROBITUSSIN DM) 100-10 MG/5ML syrup Take 5-10 mLs by mouth every 4 (four) hours as needed for cough. 02/25/21   Eppie Gibson, MD  HYDROcodone-acetaminophen (NORCO) 7.5-325 MG tablet Take 1 tablet by mouth every 6 (six) hours as needed for moderate pain. 12/18/20   Izora Gala, MD  latanoprost (XALATAN) 0.005 % ophthalmic solution Place 1 drop into both eyes at bedtime.    [provider]  lidocaine (XYLOCAINE) 2 % solution Patient: Mix 1part 2% viscous lidocaine, 1part H20. Swish & swallow 50mL of diluted mixture, 41min before meals and at bedtime, up to QID 02/04/21   Eppie Gibson, MD  lidocaine-prilocaine (EMLA) cream Apply 1 application topically as needed. 01/10/21   Wyatt Portela, MD  Multiple Vitamin (MULTIVITAMIN) tablet Take 1 tablet by mouth at bedtime.    [provider]  niacin 500 MG CR capsule Take 500 mg by mouth at bedtime.    [provider]  Nutritional Supplements (FEEDING SUPPLEMENT, OSMOLITE 1.5 CAL,) LIQD Osmolite 1.5 - Give 7 cartons split over four feedings/day via PEG. Flush tube with 90 ml water before and after each feeding. Drink by mouth or give via tube additional 2 cups fluids/day. This provides 2485 kcal, 104.3 g protein, 2467 ml total water. Meets 100% estimated needs. 02/12/21   Wyatt Portela, MD  omeprazole (PRILOSEC) 40 MG capsule TAKE 1 CAPSULE BY MOUTH EVERY DAY 12/28/20   Mauri Pole, MD   ondansetron (ZOFRAN ODT) 8 MG disintegrating tablet Take 1 tablet (8 mg total) by mouth every 8 (eight) hours as needed for nausea or vomiting. 12/18/20   Izora Gala, MD  prochlorperazine (COMPAZINE) 10 MG tablet Take 1 tablet (10 mg total) by mouth every 6 (six) hours as needed for nausea or vomiting. 01/10/21   Wyatt Portela, MD  sucralfate (CARAFATE) 1 g tablet Take 1 tablet (1 g total) by mouth 2 (two) times daily. 02/25/21   Eppie Gibson, MD  timolol (TIMOPTIC) 0.5 % ophthalmic solution Place 1 drop into both eyes daily.    [provider]  Vitamin D, Cholecalciferol, 50 MCG (2000 UT) CAPS Take 2,000 Units by mouth at bedtime.    [provider]    Allergies    Patient has no known allergies.  Review of Systems   Review of Systems  All other systems reviewed and are negative.  Physical Exam Updated Vital Signs BP 116/72   Pulse (!) 111   Temp 98.2 F (36.8 C) (Oral)   Resp (!) 22   SpO2 95%   Physical Exam Vitals and nursing note reviewed.  Constitutional:      General: He is not in acute distress.    Appearance: Normal appearance. He is well-developed.  HENT:     Head: Normocephalic and atraumatic.  Eyes:     Conjunctiva/sclera: Conjunctivae normal.     Pupils: Pupils are equal, round, and reactive to light.  Cardiovascular:     Rate and Rhythm: Tachycardia present. Rhythm irregular.     Heart sounds: Normal heart sounds.  Pulmonary:     Effort: Pulmonary effort is normal. No respiratory distress.     Breath sounds: Normal breath sounds.  Abdominal:     General: There is no distension.     Palpations: Abdomen is soft.     Tenderness: There is no abdominal tenderness.  Musculoskeletal:        General: No deformity. Normal range of motion.     Cervical back: Normal range of motion and neck supple.  Skin:    General: Skin is warm and dry.  Neurological:     General: No focal deficit present.     Mental Status: He is alert and oriented to  person, place, and time.    ED Results / Procedures / Treatments   Labs (all labs ordered are listed, but only abnormal results are displayed) Labs Reviewed  COMPREHENSIVE METABOLIC PANEL  CBC WITH DIFFERENTIAL/PLATELET  MAGNESIUM  TROPONIN I (HIGH SENSITIVITY)  TROPONIN I (HIGH SENSITIVITY)    EKG EKG Interpretation  Date/Time:  Tuesday March 05 2021 12:41:24 EST Ventricular Rate:  142 PR Interval:    QRS Duration: 127 QT Interval:  304 QTC Calculation: 468 R Axis:   70 Text Interpretation: Atrial fibrillation Right bundle branch block Confirmed by Dene Gentry [phone]) on 03/05/2021 12:58:52 PM  Radiology DG Chest Port 1 View  Result Date: 03/05/2021 CLINICAL DATA:  Tachycardia EXAM: PORTABLE CHEST 1 VIEW COMPARISON:  2016 FINDINGS: Right chest wall port catheter tip overlies SVC. No consolidation or edema. No pleural effusion or pneumothorax. Cardiomediastinal contours are within normal limits for technique. IMPRESSION: No acute process in the chest. Electronically Signed   By: Macy Mis M.D.   On: 03/05/2021 13:32    Procedures Procedures   Medications Ordered in ED Medications  diltiazem (CARDIZEM) 125 mg in dextrose 5% 125 mL (1 mg/mL) infusion (has no administration in time range)  sodium chloride 0.9 % bolus 500 mL (500 mLs Intravenous New Bag/Given 03/05/21 1317)  diltiazem (CARDIZEM) injection 10 mg (10 mg Intravenous Given 03/05/21 1316)    ED Course  I have reviewed the triage vital signs and the nursing notes.  Pertinent labs & imaging results that were available during my care of the patient were reviewed by me and considered in my medical decision making (see chart for details).    MDM Rules/Calculators/A&P                           MDM  MSE complete  Nicholas Daniels was evaluated in Emergency Department on 03/05/2021 for the symptoms described in the history of present illness. He was evaluated in the context of the global COVID-19  pandemic, which necessitated consideration that the patient might be at risk for infection with the SARS-CoV-2 virus that causes COVID-19. Institutional protocols and algorithms that pertain to the evaluation of patients at risk for COVID-19 are in a state of rapid change based on information released by regulatory bodies including the CDC and federal and state organizations. These policies and algorithms were followed during the patient's care in the ED.  Patient presented from oncology center for evaluation of rapid heart rate.  Patient is asymptomatic.  Initial EKG was suggestive of perhaps A. fib with RVR versus SVT.  Patient's rate dropped into the 140s without intervention.  EKG obtained is suggestive of A. fib with RVR.  Adenosine was not required.   Cardizem bolus and then Cardizem drip initiated for rate control.  Hospitalist services aware of case and will evaluate for admission.  Hospitalist service requested cardiology consult.   Final Clinical Impression(s) / ED Diagnoses Final diagnoses:  Atrial fibrillation with RVR [facility])    Rx / DC Orders ED Discharge Orders     None        Valarie Merino, MD 03/05/21 1547

## 2021-03-05 NOTE — ED Notes (Signed)
Per cancer center, states HR 160's-EKG shows SVT

## 2021-03-05 NOTE — H&P (Signed)
History and Physical    Nicholas Daniels LNL:[id number] DOB: Dec 11, 1950 DOA: 03/05/2021  PCP: Leanna Battles, MD  Patient coming from: Home  Chief Complaint: High HR  HPI: Nicholas Daniels is a 70 y.o. 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 Presenting with tachycardia. He went to his onco appointment yesterday for radiation and chemo. When they set him up for chemo, the staff noticed that his HR was high. They gave him a fluid bolus and his HR lowered. They sent him home without incident. He returned today for radiation and chemo. When they set him up for chemo today, they noticed again that his HR was very high. They gave him another bolus, but his HR did not lower this time. They recommended that he come to the ED for help. He did not have any CP, dyspnea, or lightheadedness/dizziness during these episodes. He denies any other aggravating or alleviating factors.  ED Course: He was found to be tachycardic with HRs up to 160s. He was given fluid and OT cardizem IV. His rates slowed to 140s. EKG showed A fib RVR. He was placed on a cardizem gtt. TRH was called for admission.    Review of Systems:  Denies CP, palpitations, dyspnea, abdominal pain, lightheadedness, dizziness, N/V/D, fever. Review of systems is otherwise negative for all not mentioned in HPI.   PMHx Past Medical History:  Diagnosis Date   Cataract    removed bilateraly   Cough    Glaucoma    Hypertension    PFO (patent foramen ovale)    PFO noted on coronary CT 09/05/14   Prostate cancer ([place]) 2009    PSHx Past Surgical History:  Procedure Laterality Date   CATARACT EXTRACTION     bilateral and a wrinkle on retina on the left    CHOLECYSTECTOMY     COLONOSCOPY     DIRECT LARYNGOSCOPY N/A 12/17/2020   Procedure: DIRECT LARYNGOSCOPY WITH BIOPSIES;  Surgeon: Izora Gala, MD;  Location: [place];  Service: ENT;  Laterality: N/A;   ESOPHAGOSCOPY N/A 12/17/2020   Procedure:  ESOPHAGOSCOPY;  Surgeon: Izora Gala, MD;  Location: [place];  Service: ENT;  Laterality: N/A;   IR GASTROSTOMY TUBE MOD SED  01/28/2021   IR IMAGING GUIDED PORT INSERTION  01/28/2021   PROSTATE SURGERY  2009   radiation treatment     RADICAL NECK DISSECTION Left 12/17/2020   Procedure: LEFT NECK DISSECTION;  Surgeon: Izora Gala, MD;  Location: [place];  Service: ENT;  Laterality: Left;   RESECTION OF MEDIASTINAL MASS N/A 02/26/2015   Procedure: RESECTION OF MEDIASTINAL MASS;  Surgeon: Ivin Poot, MD;  Location: [place];  Service: Thoracic;  Laterality: N/A;   STERNOTOMY N/A 02/26/2015   Procedure: STERNOTOMY;  Surgeon: Ivin Poot, MD;  Location: [place];  Service: Thoracic;  Laterality: N/A;   UPPER GASTROINTESTINAL ENDOSCOPY      SocHx  reports that he has never smoked. He has been exposed to tobacco smoke. He has never used smokeless tobacco. He reports that he does not drink alcohol and does not use drugs.  No Known Allergies  FamHx Family History  Problem Relation Age of Onset   Heart disease Brother    COPD Brother    Cancer Brother        liver   Liver cancer Brother    Heart disease Mother        MI   COPD Mother    Pneumonia Mother  Diabetes Mother    COPD Father    Colon polyps Other    Esophageal cancer Neg Hx    Rectal cancer Neg Hx    Stomach cancer Neg Hx     Prior to Admission medications   Medication Sig Start Date End Date Taking? Authorizing Provider  clotrimazole ([employer] CLOTRIMAZOLE) 1 % cream Apply 1 application topically in the morning, at noon, and at bedtime. Apply to rash on posterior neck. 02/18/21   Eppie Gibson, MD  aspirin 81 MG EC tablet Take 162 mg by mouth at bedtime.    [provider]  calcium-vitamin D (CALCIUM 500/D) 500-200 MG-UNIT tablet Take 1 tablet by mouth at bedtime.    [provider]  diltiazem (CARDIZEM CD) 120 MG 24 hr capsule Take 240 mg by mouth at bedtime.    [provider]  Flaxseed,  Linseed, (FLAXSEED OIL) 1200 MG CAPS Take 2,400 mg by mouth at bedtime.    [provider]  guaiFENesin-dextromethorphan (ROBITUSSIN DM) 100-10 MG/5ML syrup Take 5-10 mLs by mouth every 4 (four) hours as needed for cough. 02/25/21   Eppie Gibson, MD  HYDROcodone-acetaminophen (NORCO) 7.5-325 MG tablet Take 1 tablet by mouth every 6 (six) hours as needed for moderate pain. 12/18/20   Izora Gala, MD  latanoprost (XALATAN) 0.005 % ophthalmic solution Place 1 drop into both eyes at bedtime.    [provider]  lidocaine (XYLOCAINE) 2 % solution Patient: Mix 1part 2% viscous lidocaine, 1part H20. Swish & swallow 76mL of diluted mixture, 70min before meals and at bedtime, up to QID 02/04/21   Eppie Gibson, MD  lidocaine-prilocaine (EMLA) cream Apply 1 application topically as needed. 01/10/21   Wyatt Portela, MD  Multiple Vitamin (MULTIVITAMIN) tablet Take 1 tablet by mouth at bedtime.    [provider]  niacin 500 MG CR capsule Take 500 mg by mouth at bedtime.    [provider]  Nutritional Supplements (FEEDING SUPPLEMENT, OSMOLITE 1.5 CAL,) LIQD Osmolite 1.5 - Give 7 cartons split over four feedings/day via PEG. Flush tube with 90 ml water before and after each feeding. Drink by mouth or give via tube additional 2 cups fluids/day. This provides 2485 kcal, 104.3 g protein, 2467 ml total water. Meets 100% estimated needs. 02/12/21   Wyatt Portela, MD  omeprazole (PRILOSEC) 40 MG capsule TAKE 1 CAPSULE BY MOUTH EVERY DAY 12/28/20   Mauri Pole, MD  ondansetron (ZOFRAN ODT) 8 MG disintegrating tablet Take 1 tablet (8 mg total) by mouth every 8 (eight) hours as needed for nausea or vomiting. 12/18/20   Izora Gala, MD  prochlorperazine (COMPAZINE) 10 MG tablet Take 1 tablet (10 mg total) by mouth every 6 (six) hours as needed for nausea or vomiting. 01/10/21   Wyatt Portela, MD  sucralfate (CARAFATE) 1 g tablet Take 1 tablet (1 g total) by mouth 2 (two) times  daily. 02/25/21   Eppie Gibson, MD  timolol (TIMOPTIC) 0.5 % ophthalmic solution Place 1 drop into both eyes daily.    [provider]  Vitamin D, Cholecalciferol, 50 MCG (2000 UT) CAPS Take 2,000 Units by mouth at bedtime.    [provider]    Physical Exam: Vitals:   03/05/21 1510 03/05/21 1515 03/05/21 1520 03/05/21 1525  BP:      Pulse:  (!) 59 (!) 114 (!) 101  Resp: (!) 21 (!) 21 (!) 21 (!) 23  Temp:      TempSrc:  SpO2:  98% 97% 94%    General: 70 y.o. male resting in bed in NAD Eyes: PERRL, normal sclera ENMT: Nares patent w/o discharge, orophaynx clear, dentition normal, ears w/o discharge/lesions/ulcers Neck: Supple, trachea midline Cardiovascular: tachy irregular, +S1, S2, no m/g/r Respiratory: CTABL, no w/r/r, normal WOB GI: BS+, NDNT, no masses noted, no organomegaly noted MSK: No e/c/c Neuro: A&O x 3, no focal deficits Psyc: Appropriate interaction and affect, calm/cooperative  Labs on Admission: I have personally reviewed following labs and imaging studies  CBC: Recent Labs  Lab 03/05/21 0815  WBC 1.1*  NEUTROABS 0.8*  HGB 9.9*  HCT 29.1*  MCV 91.8  PLT 102*   Basic Metabolic Panel: Recent Labs  Lab 03/05/21 0815 03/05/21 1225  NA 135 137  K 4.3 4.4  CL 103 105  CO2 26 27  GLUCOSE 111* 141*  BUN 27* 23  CREATININE 1.06 0.78  CALCIUM 8.4* 8.1*  MG  --  1.9   GFR: Estimated Creatinine Clearance: 90.7 mL/min (by C-G formula based on SCr of 0.78 mg/dL). Liver Function Tests: Recent Labs  Lab 03/05/21 0815 03/05/21 1225  AST 17 17  ALT 32 29  ALKPHOS 29* 24*  BILITOT 0.9 0.5  PROT 6.2* 6.0*  ALBUMIN 3.3* 3.1*   No results for input(s): LIPASE, AMYLASE in the last 168 hours. No results for input(s): AMMONIA in the last 168 hours. Coagulation Profile: No results for input(s): INR, PROTIME in the last 168 hours. Cardiac Enzymes: No results for input(s): CKTOTAL, CKMB, CKMBINDEX, TROPONINI in the last 168  hours. BNP (last 3 results) No results for input(s): PROBNP in the last 8760 hours. HbA1C: No results for input(s): HGBA1C in the last 72 hours. CBG: No results for input(s): GLUCAP in the last 168 hours. Lipid Profile: No results for input(s): CHOL, HDL, LDLCALC, TRIG, CHOLHDL, LDLDIRECT in the last 72 hours. Thyroid Function Tests: No results for input(s): TSH, T4TOTAL, FREET4, T3FREE, THYROIDAB in the last 72 hours. Anemia Panel: No results for input(s): VITAMINB12, FOLATE, FERRITIN, TIBC, IRON, RETICCTPCT in the last 72 hours. Urine analysis:    Component Value Date/Time   COLORURINE AMBER (A) 02/18/2021 1152   APPEARANCEUR HAZY (A) 02/18/2021 1152   LABSPEC 1.021 02/18/2021 1152   PHURINE 5.0 02/18/2021 1152   GLUCOSEU NEGATIVE 02/18/2021 1152   HGBUR NEGATIVE 02/18/2021 1152   BILIRUBINUR NEGATIVE 02/18/2021 1152   KETONESUR NEGATIVE 02/18/2021 1152   PROTEINUR NEGATIVE 02/18/2021 1152   NITRITE NEGATIVE 02/18/2021 1152   LEUKOCYTESUR NEGATIVE 02/18/2021 1152    Radiological Exams on Admission: DG Chest Port 1 View  Result Date: 03/05/2021 CLINICAL DATA:  Tachycardia EXAM: PORTABLE CHEST 1 VIEW COMPARISON:  2016 FINDINGS: Right chest wall port catheter tip overlies SVC. No consolidation or edema. No pleural effusion or pneumothorax. Cardiomediastinal contours are within normal limits for technique. IMPRESSION: No acute process in the chest. Electronically Signed   By: Macy Mis M.D.   On: 03/05/2021 13:32    EKG: Independently reviewed. Afib, RBBB  Assessment/Plan A fib w/ RVR     - admit to inpt, progressive     - new diagnosis     - continue cardizem gtt, start heparin gtt (spoke with on call onco about this)      - cards consulted by EDP; appreciate assistance     - echo     Squamous cell carcinoma of neck      - follows w/ Dr. 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      - onco to follow      - monitor plts while on heparin gtt  DVT prophylaxis: heparin  Code Status: FULL  Family Communication: None at bedside  Consults called: EDP spoke w/ cardiology   Status is: Inpatient  Remains inpatient appropriate because: severity of illness  Jonnie Finner DO [facility]  If 7PM-7AM, please contact night-coverage [email or web address]  03/05/2021, 3:38 PM

## 2021-03-05 NOTE — ED Triage Notes (Signed)
Pt comes in from the cancer center for a fast heart rate. Staff states heart rate has been in the 160s all morning. Pt denies shortness of breath, denies chest pain.

## 2021-03-05 NOTE — ED Notes (Signed)
Call lab about the labs not resulting for over 2 hours. States they will do them now.

## 2021-03-05 NOTE — Progress Notes (Signed)
Received call from [facility] informed her that report was received on patient by day shift RN.

## 2021-03-05 NOTE — Progress Notes (Signed)
Per Dr. Alen Blew, no chemotherapy today.   IV fluids completed.  HR 163.  MD ordered EKG.  MD ordered pt to be sent to the ER.   1149  Stacy from ER notified.  Bethena Roys, RN transported pt to ER.  Pt notified family prior to transfer.  Right chest mediport accessed.

## 2021-03-05 NOTE — ED Notes (Signed)
IV team at bedside 

## 2021-03-05 NOTE — ED Notes (Signed)
Repeat EKG was given to Dr. Francia Greaves. Nurse aware.

## 2021-03-05 NOTE — Progress Notes (Signed)
ANTICOAGULATION CONSULT NOTE - Initial Consult  Pharmacy Consult for heparin Indication: atrial fibrillation  No Known Allergies  Patient Measurements:   Heparin Dosing Weight: 90kg  Vital Signs: Temp: 98.2 F (36.8 C) (11/29 1211) Temp Source: Oral (11/29 1211) BP: 116/79 (11/29 1645) Pulse Rate: 105 (11/29 1645)  Labs: Recent Labs    03/05/21 0815 03/05/21 1225 03/05/21 1425  HGB 9.9* 9.7*  --   HCT 29.1* 29.1*  --   PLT 117* 95*  --   CREATININE 1.06 0.78  --   TROPONINIHS  --  11 12    Estimated Creatinine Clearance: 90.7 mL/min (by C-G formula based on SCr of 0.78 mg/dL).   Medical History: Past Medical History:  Diagnosis Date   Cataract    removed bilateraly   Cough    Glaucoma    Hypertension    PFO (patent foramen ovale)    PFO noted on coronary CT 09/05/14   Prostate cancer ([place]) 2009    Medications:  Scheduled:   Assessment: 70 yo male admitted with elevated HR, found to have new onset atrial fibrillation.  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  No anticoagulation noted PTA.  Pharmacy consulted to dose heparin per recommendations from [facility] and oncology. Per MD, target lower end of therapeutic goal given thrombocytopenia 2/2 chemotherapy.  Baseline aPTT: ordered, Previous INR from 01/28/21 = 1 (therapeutic)  Today, 03/05/21 - Hemoglobin low at 9.7 (baseline ~13), platelets low at 95 (baseline ~200s) likely secondary to chemo - Start heparin drip at 1250 units/hr   Goal of Therapy:  Heparin level 0.3-0.5 units/ml per MD request Monitor platelets by anticoagulation protocol: Yes   Plan:  No heparin bolus given thrombocytopenia Start heparin drip at 1250 units/hr Follow-up 8 hour initial HL Monitor daily CBC, daily HL Follow-up ability to transition to oral anticoagulation  Dimple Nanas, PharmD 03/05/2021 6:19 PM

## 2021-03-05 NOTE — Progress Notes (Signed)
Hematology and Oncology Follow Up Visit  Nicholas Daniels [id number] [birth year] 70 y.o. 03/05/2021 8:20 AM Nicholas Daniels, MDPaterson, Nicholas Quince, MD   Principle Diagnosis: 70 year old with squamous cell carcinoma presented with cervical adenopathy and unknown primary.  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    Secondary diagnosis: T3N0 Gleason score 3+4 = 7 prostate cancer diagnosed 2009.  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  Prior Therapy:  He underwent prostatectomy and found to have a T3a N0 Gleason score 3+4 = 7 cancer.  He underwent salvage radiation in 2012.   He developed a rise in his PSA and enrolled in the [employer] trial.   He was off enzalutamide since August 2021 and off clinical trial at that time.  His PSA started to rise in the last year with a PSA in May 2022 was 0.56.  Previously was 0.35 and it was undetectable in May 2021.  Prior to enzalutamide his PSA was as high as 8.27 and declined to undetectable level.  He is status post direct laryngoscopy and left neck dissection completed by Dr. Constance Daniels on December 17, 2020.  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   Current therapy: Adjuvant radiation therapy with weekly cisplatin started on February 05, 2021.  He is here for week 5 of therapy.  Interim History: Nicholas Daniels presents today for a follow-up visit.  Since her last visit, he reports a few complaints predominantly increase in his mouth pain and soreness as well as periodic episodes of nausea and vomiting.  He denies any dizziness, headaches, palpitation or shortness of breath.  He denies any abdominal pain or discomfort.     Medications: Reviewed without changes. Current Outpatient Medications  Medication Sig Dispense Refill   clotrimazole ([employer] CLOTRIMAZOLE) 1 % cream Apply 1 application topically in the morning, at noon, and at bedtime. Apply to rash on  posterior neck. 30 g 0   aspirin 81 MG EC tablet Take 162 mg by mouth at bedtime.     calcium-vitamin D (CALCIUM 500/D) 500-200 MG-UNIT tablet Take 1 tablet by mouth at bedtime.     diltiazem (CARDIZEM CD) 120 MG 24 hr capsule Take 240 mg by mouth at bedtime.     Flaxseed, Linseed, (FLAXSEED OIL) 1200 MG CAPS Take 2,400 mg by mouth at bedtime.     guaiFENesin-dextromethorphan (ROBITUSSIN DM) 100-10 MG/5ML syrup Take 5-10 mLs by mouth every 4 (four) hours as needed for cough. 473 mL 3   HYDROcodone-acetaminophen (NORCO) 7.5-325 MG tablet Take 1 tablet by mouth every 6 (six) hours as needed for moderate pain. 20 tablet 0   latanoprost (XALATAN) 0.005 % ophthalmic solution Place 1 drop into both eyes at bedtime.     lidocaine (XYLOCAINE) 2 % solution Patient: Mix 1part 2% viscous lidocaine, 1part H20. Swish & swallow 45mL of diluted mixture, 72min before meals and at bedtime, up to QID 200 mL 3   lidocaine-prilocaine (EMLA) cream Apply 1 application topically as needed. 30 g 0   Multiple Vitamin (MULTIVITAMIN) tablet Take 1 tablet by mouth at bedtime.     niacin 500 MG CR capsule Take 500 mg by mouth at bedtime.     Nutritional Supplements (FEEDING SUPPLEMENT, OSMOLITE 1.5 CAL,) LIQD Osmolite 1.5 - Give 7 cartons split over four feedings/day via PEG. Flush tube with 90 ml water before and after each feeding. Drink by mouth or give via tube additional 2 cups fluids/day.  This provides 2485 kcal, 104.3 g protein, 2467 ml total water. Meets 100% estimated needs. 1659 mL    omeprazole (PRILOSEC) 40 MG capsule TAKE 1 CAPSULE BY MOUTH EVERY DAY 90 capsule 3   ondansetron (ZOFRAN ODT) 8 MG disintegrating tablet Take 1 tablet (8 mg total) by mouth every 8 (eight) hours as needed for nausea or vomiting. 20 tablet 0   prochlorperazine (COMPAZINE) 10 MG tablet Take 1 tablet (10 mg total) by mouth every 6 (six) hours as needed for nausea or vomiting. 30 tablet 0   sucralfate (CARAFATE) 1 g tablet Take 1 tablet (1 g  total) by mouth 2 (two) times daily. 60 tablet 1   timolol (TIMOPTIC) 0.5 % ophthalmic solution Place 1 drop into both eyes daily.     Vitamin D, Cholecalciferol, 50 MCG (2000 UT) CAPS Take 2,000 Units by mouth at bedtime.     No current facility-administered medications for this visit.     Allergies: No Known Allergies   Physical Exam: Blood pressure (!) 154/98, pulse (!) 164, temperature 98.6 F (37 C), temperature source Tympanic, resp. rate 18, weight 198 lb 6.4 oz (90 kg), SpO2 97 %.   ECOG: 1    General appearance: Comfortable appearing without any discomfort Head: Normocephalic without any trauma Oropharynx: Mucous membranes are moist and pink without any thrush or ulcers. Eyes: Pupils are equal and round reactive to light. Lymph nodes: No cervical, supraclavicular, inguinal or axillary lymphadenopathy.   Heart:regular rate and rhythm.  S1 and S2 without leg edema. Lung: Clear without any rhonchi or wheezes.  No dullness to percussion. Abdomin: Soft, nontender, nondistended with good bowel sounds.  No hepatosplenomegaly. Musculoskeletal: No joint deformity or effusion.  Full range of motion noted. Neurological: No deficits noted on motor, sensory and deep tendon reflex exam. Skin: No petechial rash or dryness.  Appeared moist.        Lab Results: Lab Results  Component Value Date   WBC 2.0 (L) 02/26/2021   HGB 10.7 (L) 02/26/2021   HCT 31.7 (L) 02/26/2021   MCV 90.1 02/26/2021   PLT 143 (L) 02/26/2021     Chemistry      Component Value Date/Time   NA 139 02/26/2021 0747   K 4.8 02/26/2021 0747   CL 104 02/26/2021 0747   CO2 27 02/26/2021 0747   BUN 21 02/26/2021 0747   CREATININE 0.96 02/26/2021 0747   CREATININE 1.10 02/13/2015 1545      Component Value Date/Time   CALCIUM 8.9 02/26/2021 0747   ALKPHOS 30 (L) 02/26/2021 0747   AST 17 02/26/2021 0747   ALT 33 02/26/2021 0747   BILITOT 0.9 02/26/2021 0747          Impression and  Plan:  Assessment and Plan:      70 year old with:  1. 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   Risks and benefits of continuing adjuvant treatment with cisplatin concurrently with radiation were discussed.  Complications that include nausea, vomiting, rashes, neutropenia and possible sepsis were reiterated.  He is laboratory data reviewed today and showed a decrease in his absolute neutrophil count and we will hold chemotherapy today.  We will proceed with IV hydration instead.      2.  Prostate cancer: This will be addressed after completion of his current cancer treatment.  He is experiencing biochemical relapse.  3.  IV access: Port-A-Cath will continue to be flushed and used periodically.  4.  Nutritional status: We will continue to educate him about nutritional supplements and the use of PEG tube.  We will also receive IV hydration as needed.   5.  Antiemetics: Compazine is available to him at this time.  6.  Tachycardia: Appears to be normal sinus rhythm related to dehydration and recent vomiting episode.  We will proceed with IV hydration and monitoring for the time being.   7.  Follow-up: He will return next week for repeat evaluation next cycle of therapy.   30  minutes were spent on this encounter.  Time was dedicated to reviewing laboratory data, disease status update, treatment choices and future plan of care discussion.      Zola Button, MD 11/29/20228:20 AM

## 2021-03-06 ENCOUNTER — Ambulatory Visit
Admission: RE | Admit: 2021-03-06 | Discharge: 2021-03-06 | Disposition: A | Discharge status: Home / Self Care | Payer: PPO | Source: Ambulatory Visit | Attending: Radiation Oncology | Admitting: Radiation Oncology

## 2021-03-06 ENCOUNTER — Encounter (HOSPITAL_COMMUNITY): Payer: Self-pay | Admitting: Internal Medicine

## 2021-03-06 ENCOUNTER — Other Ambulatory Visit (HOSPITAL_COMMUNITY): Payer: PPO

## 2021-03-06 DIAGNOSIS — D61818 Other pancytopenia: Secondary | ICD-10-CM | POA: Diagnosis not present

## 2021-03-06 DIAGNOSIS — I10 Essential (primary) hypertension: Secondary | ICD-10-CM | POA: Diagnosis not present

## 2021-03-06 DIAGNOSIS — I4891 Unspecified atrial fibrillation: Secondary | ICD-10-CM | POA: Diagnosis not present

## 2021-03-06 DIAGNOSIS — R638 Other symptoms and signs concerning food and fluid intake: Secondary | ICD-10-CM

## 2021-03-06 DIAGNOSIS — Z79899 Other long term (current) drug therapy: Secondary | ICD-10-CM

## 2021-03-06 LAB — COMPREHENSIVE METABOLIC PANEL
ALT: 28 U/L (ref 0–44)
AST: 17 U/L (ref 15–41)
Albumin: 3.2 g/dL — ABNORMAL LOW (ref 3.5–5.0)
Alkaline Phosphatase: 26 U/L — ABNORMAL LOW (ref 38–126)
Anion gap: 6 (ref 5–15)
BUN: 17 mg/dL (ref 8–23)
CO2: 24 mmol/L (ref 22–32)
Calcium: 8 mg/dL — ABNORMAL LOW (ref 8.9–10.3)
Chloride: 104 mmol/L (ref 98–111)
Creatinine, Ser: 0.88 mg/dL (ref 0.61–1.24)
GFR, Estimated: >60 mL/min (ref >60)
Glucose, Bld: 89 mg/dL (ref 70–99)
Potassium: 4.2 mmol/L (ref 3.5–5.1)
Sodium: 134 mmol/L — ABNORMAL LOW (ref 135–145)
Total Bilirubin: 0.9 mg/dL (ref 0.3–1.2)
Total Protein: 5.9 g/dL — ABNORMAL LOW (ref 6.5–8.1)

## 2021-03-06 LAB — CBC
HCT: 27.6 % — ABNORMAL LOW (ref 39.0–52.0)
Hemoglobin: 9.1 g/dL — ABNORMAL LOW (ref 13.0–17.0)
MCH: 31.1 pg (ref 26.0–34.0)
MCHC: 33 g/dL (ref 30.0–36.0)
MCV: 94.2 fL (ref 80.0–100.0)
Platelets: 108 10*3/uL — ABNORMAL LOW (ref 150–400)
RBC: 2.93 MIL/uL — ABNORMAL LOW (ref 4.22–5.81)
RDW: 14.2 % (ref 11.5–15.5)
WBC: 0.8 10*3/uL — CL (ref 4.0–10.5)
nRBC: 0 % (ref 0.0–0.2)

## 2021-03-06 LAB — GLUCOSE, CAPILLARY
Glucose-Capillary: 89 mg/dL (ref 70–99)
Glucose-Capillary: 173 mg/dL — ABNORMAL HIGH (ref 70–99)
Glucose-Capillary: 106 mg/dL — ABNORMAL HIGH (ref 70–99)

## 2021-03-06 LAB — HEPARIN LEVEL (UNFRACTIONATED)
Heparin Unfractionated: 0.5 IU/mL (ref 0.30–0.70)
Heparin Unfractionated: 0.6 IU/mL (ref 0.30–0.70)
Heparin Unfractionated: 0.61 IU/mL (ref 0.30–0.70)

## 2021-03-06 MED ORDER — HEPARIN (PORCINE) 25000 UT/250ML-% IV SOLN
1150.0000 [IU]/h | INTRAVENOUS | Status: AC
  Administered 2021-03-06: 18:00:00 1200 [IU]/h via INTRAVENOUS
  Administered 2021-03-08 – 2021-03-10 (×3): 1150 [IU]/h via INTRAVENOUS
  Filled 2021-03-06 (×9): qty 250

## 2021-03-06 MED ORDER — ALTEPLASE 2 MG IJ SOLR
2.0000 mg | Freq: Once | INTRAMUSCULAR | Status: AC
  Administered 2021-03-06: 2 mg
  Filled 2021-03-06: qty 2

## 2021-03-06 MED ORDER — FREE WATER
180.0000 mL | Freq: Three times a day (TID) | Status: DC
  Administered 2021-03-06 – 2021-03-12 (×17): 180 mL

## 2021-03-06 MED ORDER — OSMOLITE 1.2 CAL PO LIQD: 1000.0000 mL | ORAL | Status: DC

## 2021-03-06 MED ORDER — SODIUM CHLORIDE 0.9% FLUSH
10.0000 mL | INTRAVENOUS | Status: DC | PRN
  Administered 2021-03-08 – 2021-03-12 (×2): 10 mL

## 2021-03-06 MED ORDER — ALTEPLASE 2 MG IJ SOLR
2.0000 mg | Freq: Once | INTRAMUSCULAR | Status: DC
  Filled 2021-03-06: qty 2

## 2021-03-06 MED ORDER — CHLORHEXIDINE GLUCONATE CLOTH 2 % EX PADS
6.0000 | MEDICATED_PAD | Freq: Every day | CUTANEOUS | Status: DC
  Administered 2021-03-06 – 2021-03-12 (×7): 6 via TOPICAL

## 2021-03-06 MED ORDER — LATANOPROST 0.005 % OP SOLN
1.0000 [drp] | Freq: Every day | OPHTHALMIC | Status: DC
  Administered 2021-03-06 – 2021-03-11 (×6): 1 [drp] via OPHTHALMIC
  Filled 2021-03-06: qty 2.5

## 2021-03-06 MED ORDER — CHLORHEXIDINE GLUCONATE 0.12 % MT SOLN
15.0000 mL | Freq: Four times a day (QID) | OROMUCOSAL | Status: DC
  Administered 2021-03-06 – 2021-03-11 (×19): 15 mL via OROMUCOSAL
  Filled 2021-03-06 (×18): qty 15

## 2021-03-06 MED ORDER — OSMOLITE 1.5 CAL PO LIQD
474.0000 mL | Freq: Three times a day (TID) | ORAL | Status: DC
  Administered 2021-03-06 – 2021-03-11 (×13): 474 mL
  Filled 2021-03-06 (×15): qty 474

## 2021-03-06 MED ORDER — TIMOLOL MALEATE 0.5 % OP SOLN
1.0000 [drp] | Freq: Every day | OPHTHALMIC | Status: DC
  Administered 2021-03-06 – 2021-03-12 (×7): 1 [drp] via OPHTHALMIC
  Filled 2021-03-06: qty 5

## 2021-03-06 MED ORDER — METOPROLOL TARTRATE 5 MG/5ML IV SOLN
5.0000 mg | Freq: Once | INTRAVENOUS | Status: AC
  Administered 2021-03-06: 5 mg via INTRAVENOUS
  Filled 2021-03-06: qty 5

## 2021-03-06 MED ORDER — MAGIC MOUTHWASH W/LIDOCAINE
5.0000 mL | Freq: Three times a day (TID) | ORAL | Status: DC
  Administered 2021-03-06 – 2021-03-12 (×16): 5 mL via ORAL
  Filled 2021-03-06 (×22): qty 5

## 2021-03-06 MED ORDER — SODIUM CHLORIDE 0.9% FLUSH
10.0000 mL | Freq: Two times a day (BID) | INTRAVENOUS | Status: DC
  Administered 2021-03-07 – 2021-03-10 (×2): 10 mL

## 2021-03-06 MED ORDER — PROSOURCE TF PO LIQD
45.0000 mL | Freq: Two times a day (BID) | ORAL | Status: DC
  Administered 2021-03-06 – 2021-03-10 (×9): 45 mL
  Filled 2021-03-06 (×10): qty 45

## 2021-03-06 NOTE — Progress Notes (Signed)
ANTICOAGULATION CONSULT NOTE   Pharmacy Consult for heparin Indication: atrial fibrillation  No Known Allergies  Patient Measurements: Height: 5\' 7"  (170.2 cm) Weight: 89.9 kg (198 lb 3.1 oz) IBW/kg (Calculated) : 66.1 Heparin Dosing Weight: 90kg  Vital Signs: Temp: 98.1 F (36.7 C) (11/30 2207) Temp Source: Oral (11/30 2207) BP: 117/71 (11/30 2207) Pulse Rate: 89 (11/30 2207)  Labs: Recent Labs    03/05/21 0815 03/05/21 1225 03/05/21 1425 03/05/21 2104 03/06/21 0412 03/06/21 1059 03/06/21 2132  HGB 9.9* 9.7*  --   --  9.1*  --   --   HCT 29.1* 29.1*  --   --  27.6*  --   --   PLT 117* 95*  --   --  108*  --   --   APTT  --   --   --  20*  --   --   --   HEPARINUNFRC  --   --   --   --  0.50 0.60 0.61  CREATININE 1.06 0.78  --   --  0.88  --   --   TROPONINIHS  --  11 12  --   --   --   --      Estimated Creatinine Clearance: 82.3 mL/min (by C-G formula based on SCr of 0.88 mg/dL).   Medical History: Past Medical History:  Diagnosis Date   Cataract    removed bilateraly   Coronary artery calcification seen on CT scan    Cough    Essential hypertension    Glaucoma    Head and neck cancer (HCC)    Hypertension    PFO (patent foramen ovale)    PFO noted on coronary CT 09/05/14   Prostate cancer ([place]) 2009    Medications:  Scheduled:   alteplase  2 mg Intracatheter Once   alteplase  2 mg Intracatheter Once   chlorhexidine  15 mL Mouth/Throat QID   Chlorhexidine Gluconate Cloth  6 each Topical Daily   feeding supplement (OSMOLITE 1.5 CAL)  474 mL Per Tube TID   feeding supplement (PROSource TF)  45 mL Per Tube BID   free water  180 mL Per Tube TID   latanoprost  1 drop Both Eyes QHS   magic mouthwash w/lidocaine  5 mL Oral TID   pantoprazole sodium  40 mg Per Tube Q1200   sodium chloride flush  10-40 mL Intracatheter Q12H   timolol  1 drop Both Eyes Daily    Assessment: 70 yo male admitted with elevated HR, found to have new onset atrial  fibrillation.  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  No anticoagulation noted PTA.  Pharmacy consulted to dose heparin per recommendations from [facility] and oncology. Per MD, target lower end of therapeutic goal given thrombocytopenia 2/2 chemotherapy.  heparin level above stated goal per Md of 0.3-0.5 on current IV heparin rate of 1200 units/hr NO reported bleeding per RN  Goal of Therapy:  Heparin level 0.3-0.5 units/ml per MD request due to above Monitor platelets by anticoagulation protocol: Yes   Plan:  Reduce IV heparin rate slightly from 1200 units/hr to 1100 units/hr Recheck heparin level in 8 hours Monitor daily CBC, daily HL Follow-up ability to transition to oral anticoagulation    Dolly Rias RPh 03/06/2021, 10:17 PM

## 2021-03-06 NOTE — Progress Notes (Signed)
ANTICOAGULATION CONSULT NOTE   Pharmacy Consult for heparin Indication: atrial fibrillation  No Known Allergies  Patient Measurements: Height: 5\' 7"  (170.2 cm) Weight: 89.9 kg (198 lb 3.1 oz) IBW/kg (Calculated) : 66.1 Heparin Dosing Weight: 90kg  Vital Signs: Temp: 98.2 F (36.8 C) (11/30 0244) Temp Source: Oral (11/30 0244) BP: 135/83 (11/30 0244) Pulse Rate: 102 (11/30 0244)  Labs: Recent Labs    03/05/21 0815 03/05/21 1225 03/05/21 1425 03/05/21 2104 03/06/21 0412  HGB 9.9* 9.7*  --   --  9.1*  HCT 29.1* 29.1*  --   --  27.6*  PLT 117* 95*  --   --  108*  APTT  --   --   --  20*  --   HEPARINUNFRC  --   --   --   --  0.50  CREATININE 1.06 0.78  --   --  0.88  TROPONINIHS  --  11 12  --   --      Estimated Creatinine Clearance: 82.3 mL/min (by C-G formula based on SCr of 0.88 mg/dL).   Medical History: Past Medical History:  Diagnosis Date   Cataract    removed bilateraly   Cough    Glaucoma    Hypertension    PFO (patent foramen ovale)    PFO noted on coronary CT 09/05/14   Prostate cancer ([place]) 2009    Medications:  Scheduled:   Chlorhexidine Gluconate Cloth  6 each Topical Daily   pantoprazole sodium  40 mg Per Tube Q1200    Assessment: 70 yo male admitted with elevated HR, found to have new onset atrial fibrillation.  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  No anticoagulation noted PTA.  Pharmacy consulted to dose heparin per recommendations from [facility] and oncology. Per MD, target lower end of therapeutic goal given thrombocytopenia 2/2 chemotherapy.   Today, 03/06/21 Heparin level therapeutic, at upper end of goal range RN confirms infusion rate of 1250 units/hr.  No bleeding reported. Hemoglobin low at 9.1, platelets low at 108 K, likely secondary to chemo  Goal of Therapy:  Heparin level 0.3-0.5 units/ml per MD request Monitor platelets by anticoagulation protocol: Yes   Plan:  Continue heparin at 1250  units/hr Confirmatory heparin level today at 12 noon Monitor daily CBC, daily HL Follow-up ability to transition to oral anticoagulation .

## 2021-03-06 NOTE — TOC Progression Note (Signed)
Transition of [facility]) - Progression Note    Patient Details  Name: Nicholas Daniels MRN: [id number] Date of Birth: [birth year]  Transition of [facility]) CM/SW Contact  Purcell Mouton, RN Phone Number: 03/06/2021, 11:55 AM  Clinical Narrative:     Spoke with pt's wife Nicholas Daniels concerning problems paying for Osmolite. Explained there are coupons online, also left VM asking dietician if they had any resources and called [employer] to check on discount resources.  Expected Discharge Plan: Home/Self Care Barriers to Discharge: No Barriers Identified  Expected Discharge Plan and Services Expected Discharge Plan: Home/Self Care       Living arrangements for the past 2 months: Single Family Home                                       Social Determinants of Health (SDOH) Interventions    Readmission Risk Interventions No flowsheet data found.

## 2021-03-06 NOTE — Progress Notes (Signed)
ANTICOAGULATION CONSULT NOTE   Pharmacy Consult for heparin Indication: atrial fibrillation  No Known Allergies  Patient Measurements: Height: 5\' 7"  (170.2 cm) Weight: 89.9 kg (198 lb 3.1 oz) IBW/kg (Calculated) : 66.1 Heparin Dosing Weight: 90kg  Vital Signs: Temp: 98.5 F (36.9 C) (11/30 1229) Temp Source: Oral (11/30 1229) BP: 119/74 (11/30 1229) Pulse Rate: 86 (11/30 1229)  Labs: Recent Labs    03/05/21 0815 03/05/21 1225 03/05/21 1425 03/05/21 2104 03/06/21 0412 03/06/21 1059  HGB 9.9* 9.7*  --   --  9.1*  --   HCT 29.1* 29.1*  --   --  27.6*  --   PLT 117* 95*  --   --  108*  --   APTT  --   --   --  20*  --   --   HEPARINUNFRC  --   --   --   --  0.50 0.60  CREATININE 1.06 0.78  --   --  0.88  --   TROPONINIHS  --  11 12  --   --   --      Estimated Creatinine Clearance: 82.3 mL/min (by C-G formula based on SCr of 0.88 mg/dL).   Medical History: Past Medical History:  Diagnosis Date   Cataract    removed bilateraly   Coronary artery calcification seen on CT scan    Cough    Essential hypertension    Glaucoma    Head and neck cancer (HCC)    Hypertension    PFO (patent foramen ovale)    PFO noted on coronary CT 09/05/14   Prostate cancer ([place]) 2009    Medications:  Scheduled:   alteplase  2 mg Intracatheter Once   Chlorhexidine Gluconate Cloth  6 each Topical Daily   pantoprazole sodium  40 mg Per Tube Q1200   sodium chloride flush  10-40 mL Intracatheter Q12H    Assessment: 70 yo male admitted with elevated HR, found to have new onset atrial fibrillation.  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  No anticoagulation noted PTA.  Pharmacy consulted to dose heparin per recommendations from [facility] and oncology. Per MD, target lower end of therapeutic goal given thrombocytopenia 2/2 chemotherapy.  Confirmatory heparin level above stated goal per Md of 0.3-0.5 on current IV heparin rate of 1250 units/hr NO reported  bleeding per RN  Goal of Therapy:  Heparin level 0.3-0.5 units/ml per MD request due to above Monitor platelets by anticoagulation protocol: Yes   Plan:  Reduce IV heparin rate slightly from 1250 units/hr to 1200 units/hr Recheck heparin level at 2100 Monitor daily CBC, daily HL Follow-up ability to transition to oral anticoagulation    Adrian Saran, PharmD, BCPS Secure Chat if ?s 03/06/2021 12:40 PM

## 2021-03-06 NOTE — TOC Progression Note (Signed)
Transition of Care [facility]) - Progression Note    Patient Details  Name: KENNIEL BERGSMA MRN: [id number] Date of Birth: [birth year]  [facility]) CM/SW Contact  Purcell Mouton, RN Phone Number: 03/06/2021, 10:51 AM  Clinical Narrative:    Pt from home with spouse. TOC reviewed pt's chart and will continue to follow for discharge needs.    Expected Discharge Plan: Home/Self Care Barriers to Discharge: No Barriers Identified  Expected Discharge Plan and Services Expected Discharge Plan: Home/Self Care       Living arrangements for the past 2 months: Single Family Home                                       Social Determinants of Health (SDOH) Interventions    Readmission Risk Interventions No flowsheet data found.

## 2021-03-06 NOTE — Progress Notes (Signed)
PROGRESS NOTE    Nicholas Daniels  OHY:[id number] DOB: 01-30-1951 DOA: 03/05/2021 PCP: Leanna Battles, MD   Brief Narrative: Nicholas Daniels is a 70 y.o. 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 Presenting with tachycardia. He went to his onco appointment yesterday for radiation and chemo. When they set him up for chemo, the staff noticed that his HR was high. They gave him a fluid bolus and his HR lowered. They sent him home without incident. He returned today for radiation and chemo. When they set him up for chemo today, they noticed again that his HR was very high. They gave him another bolus, but his HR did not lower this time. They recommended that he come to the ED for help. He did not have any CP, dyspnea, or lightheadedness/dizziness during these episodes. He denies any other aggravating or alleviating factors.   ED Course: He was found to be tachycardic with HRs up to 160s. He was given fluid and OT cardizem IV. His rates slowed to 140s. EKG showed A fib RVR. He was placed on a cardizem gtt. TRH was called for admission.    Assessment & Plan:   Principal Problem:   Atrial fibrillation with RVR ([place])  #1 new onset afib likely precipitated by multifactorial reasons -dehydration and AKI and chemo-CHA2DS2-VASc score is 3  currently on IV diltiazem and IV heparin. 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 Alen Blew  #5-pancytopenia/neutropenia White count 0.8, hemoglobin 9.1, platelets 108 likely due to chemo.  #6 mouth sores chlorhexidine mouthwash  #7 AKI resolved with IV fluids   Estimated body mass index is 31.04 kg/m as calculated from the following:   Height as of this encounter:  5\' 7"  (1.702 m).   Weight as of this encounter: 89.9 kg.  DVT prophylaxis: Heparin  code Status: Full code  family Communication: None at bedside  disposition Plan:  Status is: Inpatient  Remains inpatient appropriate because: On IV heparin and IV Cardizem   Consultants:  Cardiology  Procedures: None Antimicrobials: None  Subjective: Patient is resting in bed bedside monitor still shows A. fib rapid rate he denies any chest pain palpitation shortness of breath remains on IV Cardizem and heparin  Objective: Vitals:   03/06/21 0244 03/06/21 0401 03/06/21 0623 03/06/21 1229  BP: 135/83 138/83 136/75 119/74  Pulse: (!) 102 (!) 103 (!) 106 86  Resp: 20 20 20  (!) 21  Temp: 98.2 F (36.8 C) 98.5 F (36.9 C) 98.1 F (36.7 C) 98.5 F (36.9 C)  TempSrc: Oral Oral Oral Oral  SpO2: 97% 100% 95% 95%  Weight:      Height:        Intake/Output Summary (Last 24 hours) at 03/06/2021 1314 Last data filed at 03/06/2021 9485 Gross per 24 hour  Intake 255.95 ml  Output 400 ml  Net -144.05 ml   Filed Weights   03/05/21 2209  Weight: 89.9 kg    Examination: Port right upper chest General exam: Appears calm and comfortable  Respiratory system: Clear to auscultation. Respiratory effort normal. Cardiovascular system: S1 & S2 heard, irregular irregular no JVD, murmurs, rubs, gallops or clicks. No pedal edema. Gastrointestinal system: Abdomen is nondistended, soft and nontender. No organomegaly or masses felt.  Normal bowel sounds heard. Central nervous system: Alert and oriented. No focal neurological deficits. Extremities: Trace edema Skin: No rashes, lesions or ulcers Psychiatry: Judgement and insight appear normal. Mood & affect appropriate.     Data Reviewed: I have personally reviewed following labs and imaging studies  CBC: Recent Labs  Lab 03/05/21 0815 03/05/21 1225 03/06/21 0412  WBC 1.1* 0.8* 0.8*  NEUTROABS 0.8* 0.6*  --   HGB 9.9* 9.7* 9.1*  HCT 29.1* 29.1*  27.6*  MCV 91.8 93.6 94.2  PLT 117* 95* 425*   Basic Metabolic Panel: Recent Labs  Lab 03/05/21 0815 03/05/21 1225 03/06/21 0412  NA 135 137 134*  K 4.3 4.4 4.2  CL 103 105 104  CO2 26 27 24   GLUCOSE 111* 141* 89  BUN 27* 23 17  CREATININE 1.06 0.78 0.88  CALCIUM 8.4* 8.1* 8.0*  MG  --  1.9  --    GFR: Estimated Creatinine Clearance: 82.3 mL/min (by C-G formula based on SCr of 0.88 mg/dL). Liver Function Tests: Recent Labs  Lab 03/05/21 0815 03/05/21 1225 03/06/21 0412  AST 17 17 17   ALT 32 29 28  ALKPHOS 29* 24* 26*  BILITOT 0.9 0.5 0.9  PROT 6.2* 6.0* 5.9*  ALBUMIN 3.3* 3.1* 3.2*   No results for input(s): LIPASE, AMYLASE in the last 168 hours. No results for input(s): AMMONIA in the last 168 hours. Coagulation Profile: No results for input(s): INR, PROTIME in the last 168 hours. Cardiac Enzymes: No results for input(s): CKTOTAL, CKMB, CKMBINDEX, TROPONINI in the last 168 hours. BNP (last 3 results) No results for input(s): PROBNP in the last 8760 hours. HbA1C: No results for input(s): HGBA1C in the last 72 hours. CBG: No results for input(s): GLUCAP in the last 168 hours. Lipid Profile: No results for input(s): CHOL, HDL, LDLCALC, TRIG, CHOLHDL, LDLDIRECT in the last 72 hours. Thyroid Function Tests: No results for input(s): TSH, T4TOTAL, FREET4, T3FREE, THYROIDAB in the last 72 hours. Anemia Panel: No results for input(s): VITAMINB12, FOLATE, FERRITIN, TIBC, IRON, RETICCTPCT in the last 72 hours. Sepsis Labs: No results for input(s): PROCALCITON, LATICACIDVEN in the last 168 hours.  Recent Results (from the past 240 hour(s))  Resp Panel by RT-PCR (Flu A&B, Covid) Nasopharyngeal Swab     Status: None   Collection Time: 03/05/21  3:23 PM   Specimen: Nasopharyngeal Swab; Nasopharyngeal(NP) swabs in vial transport medium  Result Value Ref Range Status   SARS Coronavirus 2 by RT PCR NEGATIVE NEGATIVE Final    Comment: (NOTE) SARS-CoV-2 target nucleic  acids are NOT DETECTED.  The SARS-CoV-2 RNA is generally detectable in upper respiratory specimens during the acute phase of infection. The lowest concentration of SARS-CoV-2 viral copies this assay can detect is 138 copies/mL. A negative result does not preclude SARS-Cov-2 infection and should not be used as the sole basis for treatment or other patient management decisions. A negative result may occur with  improper specimen collection/handling, submission of specimen other than nasopharyngeal swab, presence of viral mutation(s) within the areas targeted by this assay, and inadequate number of viral copies(<138 copies/mL). A negative result must be combined with clinical observations, patient history, and epidemiological information. The expected result is Negative.  Fact Sheet for Patients:  EntrepreneurPulse.com.au  Fact Sheet for Healthcare Providers:  IncredibleEmployment.be  This test is no t yet approved or cleared by the [place] FDA and  has been authorized for detection and/or diagnosis of SARS-CoV-2 by FDA under an Emergency Use Authorization (EUA). This  EUA will remain  in effect (meaning this test can be used) for the duration of the COVID-19 declaration under Section 564(b)(1) of the Act, 21 U.S.C.section 360bbb-3(b)(1), unless the authorization is terminated  or revoked sooner.       Influenza A by PCR NEGATIVE NEGATIVE Final   Influenza B by PCR NEGATIVE NEGATIVE Final    Comment: (NOTE) The Xpert Xpress SARS-CoV-2/FLU/RSV plus assay is intended as an aid in the diagnosis of influenza from Nasopharyngeal swab specimens and should not be used as a sole basis for treatment. Nasal washings and aspirates are unacceptable for Xpert Xpress SARS-CoV-2/FLU/RSV testing.  Fact Sheet for Patients: EntrepreneurPulse.com.au  Fact Sheet for Healthcare Providers: IncredibleEmployment.be  This  test is not yet approved or cleared by the [place] FDA and has been authorized for detection and/or diagnosis of SARS-CoV-2 by FDA under an Emergency Use Authorization (EUA). This EUA will remain in effect (meaning this test can be used) for the duration of the COVID-19 declaration under Section 564(b)(1) of the Act, 21 U.S.C. section 360bbb-3(b)(1), unless the authorization is terminated or revoked.  Performed at [facility], [place] [address] [place], [place] [address]          Radiology Studies: [facility] Chest Port 1 View  Result Date: 03/05/2021 CLINICAL DATA:  Tachycardia EXAM: PORTABLE CHEST 1 VIEW COMPARISON:  2016 FINDINGS: Right chest wall port catheter tip overlies SVC. No consolidation or edema. No pleural effusion or pneumothorax. Cardiomediastinal contours are within normal limits for technique. IMPRESSION: No acute process in the chest. Electronically Signed   By: Macy Mis M.D.   On: 03/05/2021 13:32        Scheduled Meds:  alteplase  2 mg Intracatheter Once   Chlorhexidine Gluconate Cloth  6 each Topical Daily   pantoprazole sodium  40 mg Per Tube Q1200   sodium chloride flush  10-40 mL Intracatheter Q12H   Continuous Infusions:  diltiazem (CARDIZEM) infusion 10 mg/hr (03/06/21 2585)   heparin 1,200 Units/hr (03/06/21 1309)     LOS: 1 day   Georgette Shell, MD 03/06/2021, 1:14 PM

## 2021-03-06 NOTE — Consult Note (Addendum)
Cardiology Consultation:   Patient ID: Nicholas Daniels MRN: [id number]; DOB: [birth year]  Admit date: 03/05/2021 Date of Consult: 03/06/2021  PCP:  Leanna Battles, MD   [place] Providers Cardiologist: Remotely Dr. Wynonia Lawman, being seen by Dr. Lafonda Mosses here to update MD or APP on Care Team, Refresh:1}     Patient Profile:   Nicholas Daniels is a 70 y.o. 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 Marylyn Ishihara.  History of Present Illness:   [name] was remotely seen by Dr. Wynonia Lawman several years ago and had a coronary CTA in 08/2014 showing nonobstructive plaque in the mid LAD, PFO present; also mention of circular rim calcified mass in the anterior mediastinum. F/u chest CT 02/2015 showed partially calcified anterior mediastinal mass felt to be a benign process at the time. He is on diltiazem per home med list which he states is for HTN; denies hx of arrhythmia. He also has hx of prostate CA treated with prostatectomy/radiation, with rise in PSA suggesting biochemical relapse treated with enzalutamide. In 08/2020 he was diagnosed with squamous cell carcinoma of the head and neck. 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 He has been treated with adjuvant radiation and cisplatin therapy.   He was seen in the oncology office yesterday reporting increase in mouth pain/soreness, vomiting, and generalized weakness therefore chemo was held. He had also noticed increased DOE for about 3 weeks but no CP or palpitations. He was found to be tachycardic and referred for IV fluids due to concern for dehydration. Tachycardia persisted so he was sent to the ED where  this was confirmed due to rapid atrial fib/flutter. Labs showed AKI with elevated BUN/Cr 26/1.06, pancytopenia with WBC nadir 0.8, Hgb  nadir 9.1, thrombocytopenia platelet count nadir of 95 (previously normal CBC 11/14). CXR NAD. He was started on heparin and diltiazem bolus and infusion. Per admitting physician, they spoke to on-call oncology about heparin drip. 2D echo pending. HR now 105-1teens. He is generally comfortable without acute complaint currently.  Past Medical History:  Diagnosis Date   Cataract    removed bilateraly   Coronary artery calcification seen on CT scan    Cough    Essential hypertension    Glaucoma    Head and neck cancer (HCC)    Hypertension    PFO (patent foramen ovale)    PFO noted on coronary CT 09/05/14   Prostate cancer ([place]) 2009    Past Surgical History:  Procedure Laterality Date   CATARACT EXTRACTION     bilateral and a wrinkle on retina on the left    CHOLECYSTECTOMY     COLONOSCOPY     DIRECT LARYNGOSCOPY N/A 12/17/2020   Procedure: DIRECT LARYNGOSCOPY WITH BIOPSIES;  Surgeon: Izora Gala, MD;  Location: [facility];  Service: ENT;  Laterality: N/A;   ESOPHAGOSCOPY N/A 12/17/2020   Procedure: ESOPHAGOSCOPY;  Surgeon: Izora Gala, MD;  Location: [facility];  Service: ENT;  Laterality: N/A;   IR GASTROSTOMY TUBE MOD SED  01/28/2021   IR IMAGING GUIDED PORT INSERTION  01/28/2021   PROSTATE SURGERY  2009   radiation treatment     RADICAL NECK DISSECTION Left 12/17/2020   Procedure: LEFT NECK DISSECTION;  Surgeon: Izora Gala, MD;  Location: [facility];  Service: ENT;  Laterality: Left;   RESECTION OF MEDIASTINAL MASS N/A 02/26/2015   Procedure: RESECTION OF MEDIASTINAL MASS;  Surgeon: Ivin Poot, MD;  Location: [facility];  Service: Thoracic;  Laterality: N/A;   STERNOTOMY N/A 02/26/2015   Procedure: STERNOTOMY;  Surgeon: Ivin Poot, MD;  Location: [facility];  Service: Thoracic;  Laterality: N/A;   UPPER GASTROINTESTINAL ENDOSCOPY       Home Medications:   Prior to Admission medications   Medication Sig Start Date End Date Taking? Authorizing Provider  aspirin 81 MG EC tablet Take 162 mg by mouth at bedtime.   Yes [provider]  calcium-vitamin D (CALCIUM 500/D) 500-200 MG-UNIT tablet Take 1 tablet by mouth at bedtime.   Yes [provider]  clotrimazole ([employer] CLOTRIMAZOLE) 1 % cream Apply 1 application topically in the morning, at noon, and at bedtime. Apply to rash on posterior neck. 02/18/21  Yes Eppie Gibson, MD  diltiazem (CARDIZEM CD) 120 MG 24 hr capsule Take 240 mg by mouth at bedtime.   Yes [provider]  Flaxseed, Linseed, (FLAXSEED OIL) 1200 MG CAPS Take 2,400 mg by mouth at bedtime.   Yes [provider]  guaiFENesin-dextromethorphan (ROBITUSSIN DM) 100-10 MG/5ML syrup Take 5-10 mLs by mouth every 4 (four) hours as needed for cough. 02/25/21  Yes Eppie Gibson, MD  latanoprost (XALATAN) 0.005 % ophthalmic solution Place 1 drop into both eyes at bedtime.   Yes [provider]  lidocaine (XYLOCAINE) 2 % solution Patient: Mix 1part 2% viscous lidocaine, 1part H20. Swish & swallow 71mL of diluted mixture, 96min before meals and at bedtime, up to QID 02/04/21  Yes Eppie Gibson, MD  lidocaine-prilocaine (EMLA) cream Apply 1 application topically as needed. 01/10/21  Yes Wyatt Portela, MD  Multiple Vitamin (MULTIVITAMIN) tablet Take 1 tablet by mouth at bedtime.   Yes [provider]  niacin 500 MG CR capsule Take 500 mg by mouth at bedtime.   Yes [provider]  Nutritional Supplements (FEEDING SUPPLEMENT, OSMOLITE 1.5 CAL,) LIQD Osmolite 1.5 - Give 7 cartons split over four feedings/day via PEG. Flush tube with 90 ml water before and after each feeding. Drink by mouth or give via tube additional 2 cups fluids/day. This provides 2485 kcal, 104.3 g protein, 2467 ml total water. Meets 100% estimated needs. 02/12/21  Yes Wyatt Portela, MD  omeprazole (PRILOSEC) 40 MG capsule TAKE  1 CAPSULE BY MOUTH EVERY DAY 12/28/20  Yes Nandigam, Venia Minks, MD  ondansetron (ZOFRAN ODT) 8 MG disintegrating tablet Take 1 tablet (8 mg total) by mouth every 8 (eight) hours as needed for nausea or vomiting. 12/18/20  Yes Izora Gala, MD  prochlorperazine (COMPAZINE) 10 MG tablet Take 1 tablet (10 mg total) by mouth every 6 (six) hours as needed for nausea or vomiting. 01/10/21  Yes Wyatt Portela, MD  sucralfate (CARAFATE) 1 g tablet Take 1 tablet (1 g total) by mouth 2 (two) times daily. 02/25/21  Yes Eppie Gibson, MD  timolol (TIMOPTIC) 0.5 % ophthalmic solution Place 1 drop into both eyes daily.   Yes [provider]  Vitamin D, Cholecalciferol, 50 MCG (2000 UT) CAPS Take 2,000 Units by mouth at bedtime.   Yes [provider]  HYDROcodone-acetaminophen (NORCO) 7.5-325 MG tablet Take 1 tablet by mouth every 6 (six) hours as needed for moderate pain. Patient not taking: Reported on 03/05/2021 12/18/20   Izora Gala, MD  Inpatient Medications: Scheduled Meds:  alteplase  2 mg Intracatheter Once   Chlorhexidine Gluconate Cloth  6 each Topical Daily   metoprolol tartrate  5 mg Intravenous Once   pantoprazole sodium  40 mg Per Tube Q1200   sodium chloride flush  10-40 mL Intracatheter Q12H   Continuous Infusions:  diltiazem (CARDIZEM) infusion 10 mg/hr (03/06/21 2876)   heparin 1,250 Units/hr (03/05/21 2109)   PRN Meds: phenol, sodium chloride flush  Allergies:   No Known Allergies  Social History:   Social History   Socioeconomic History   Marital status: Married    Spouse name: Not on file   Number of children: Not on file   Years of education: Not on file   Highest education level: Not on file  Occupational History   Not on file  Tobacco Use   Smoking status: Never    Passive exposure: Past   Smokeless tobacco: Never  Vaping Use   Vaping Use: Never used  Substance and Sexual Activity   Alcohol use: No   Drug use: No   Sexual activity: Not  Currently  Other Topics Concern   Not on file  Social History Narrative   Not on file   Social Determinants of Health   Financial Resource Strain: Low Risk    Difficulty of Paying Living Expenses: Not hard at all  Food Insecurity: No Food Insecurity   Worried About Charity fundraiser in the Last Year: Never true   Arboriculturist in the Last Year: Never true  Transportation Needs: No Transportation Needs   Lack of Transportation (Medical): No   Lack of Transportation (Non-Medical): No  Physical Activity: Not on file  Stress: Not on file  Social Connections: Socially Integrated   Frequency of Communication with Friends and Family: More than three times a week   Frequency of Social Gatherings with Friends and Family: Once a week   Attends Religious Services: 1 to 4 times per year   Active Member of [employer] or Organizations: Yes   Attends Archivist Meetings: 1 to 4 times per year   Marital Status: Married  Human resources officer Violence: Not on file    Family History:    Family History  Problem Relation Age of Onset   Heart disease Brother    COPD Brother    Cancer Brother        liver   Liver cancer Brother    Heart disease Mother        MI   COPD Mother    Pneumonia Mother    Diabetes Mother    COPD Father    Colon polyps Other    Esophageal cancer Neg Hx    Rectal cancer Neg Hx    Stomach cancer Neg Hx      ROS:  Please see the history of present illness.   All other ROS reviewed and negative.     Physical Exam/Data:   Vitals:   03/05/21 2209 03/06/21 0244 03/06/21 0401 03/06/21 0623  BP: 130/72 135/83 138/83 136/75  Pulse: 80 (!) 102 (!) 103 (!) 106  Resp: 20 20 20 20   Temp: 98.5 F (36.9 C) 98.2 F (36.8 C) 98.5 F (36.9 C) 98.1 F (36.7 C)  TempSrc: Oral Oral Oral Oral  SpO2: 100% 97% 100% 95%  Weight: 89.9 kg     Height: 5\' 7"  (1.702 m)       Intake/Output Summary (Last 24 hours) at 03/06/2021 8115 Last data filed  at 03/06/2021  1191 Gross per 24 hour  Intake 255.95 ml  Output 400 ml  Net -144.05 ml   Last 3 Weights 03/05/2021 03/05/2021 03/04/2021  Weight (lbs) 198 lb 3.1 oz 198 lb 6.4 oz 194 lb 8 oz  Weight (kg) 89.9 kg 89.994 kg 88.225 kg     Body mass index is 31.04 kg/m.  General: Well developed, well nourished, in no acute distress.Pale. Head: Normocephalic, atraumatic, sclera non-icteric, no xanthomas, nares are without discharge. Neck: Negative for carotid bruits. JVP not elevated. Lungs: Clear bilaterally to auscultation without wheezes, rales, or rhonchi. Breathing is unlabored. Heart: Irregularly irregular, borderline tachycardi, S1 S2 without murmurs, rubs, or gallops.  Abdomen: Soft, non-tender, non-distended with normoactive bowel sounds. No rebound/guarding. Extremities: No clubbing or cyanosis. No edema. Distal pedal pulses are 2+ and equal bilaterally. Neuro: Alert and oriented X 3. Moves all extremities spontaneously. Psych:  Responds to questions appropriately with a normal affect.   EKG:  The EKG was personally reviewed and demonstrates:   Initial tracing 11:36am SVT suspicious for atrial flutter 161bpm with RBBB, nonspecific TW changes 12:41 atrial fib vs flutter 142bpm, RBBB, nonspecific TW changes 15:26 atrial fib 12bpm, RBBB, nonspecific TW changes  Telemetry:  Telemetry was personally reviewed and demonstrates:  atrial flutter initially more recently atrial fib  Relevant CV Studies: No prior echo on file  Laboratory Data:  High Sensitivity Troponin:   Recent Labs  Lab 03/05/21 1225 03/05/21 1425  TROPONINIHS 11 12     Chemistry Recent Labs  Lab 03/05/21 0815 03/05/21 1225 03/06/21 0412  NA 135 137 134*  K 4.3 4.4 4.2  CL 103 105 104  CO2 26 27 24   GLUCOSE 111* 141* 89  BUN 27* 23 17  CREATININE 1.06 0.78 0.88  CALCIUM 8.4* 8.1* 8.0*  MG  --  1.9  --   GFRNONAA >60 >60 >60  ANIONGAP 6 5 6     Recent Labs  Lab 03/05/21 0815 03/05/21 1225 03/06/21 0412   PROT 6.2* 6.0* 5.9*  ALBUMIN 3.3* 3.1* 3.2*  AST 17 17 17   ALT 32 29 28  ALKPHOS 29* 24* 26*  BILITOT 0.9 0.5 0.9   Lipids No results for input(s): CHOL, TRIG, HDL, LABVLDL, LDLCALC, CHOLHDL in the last 168 hours.  Hematology Recent Labs  Lab 03/05/21 0815 03/05/21 1225 03/06/21 0412  WBC 1.1* 0.8* 0.8*  RBC 3.17* 3.11* 2.93*  HGB 9.9* 9.7* 9.1*  HCT 29.1* 29.1* 27.6*  MCV 91.8 93.6 94.2  MCH 31.2 31.2 31.1  MCHC 34.0 33.3 33.0  RDW 13.9 14.3 14.2  PLT 117* 95* 108*   Thyroid No results for input(s): TSH, FREET4 in the last 168 hours.  BNPNo results for input(s): BNP, PROBNP in the last 168 hours.  DDimer No results for input(s): DDIMER in the last 168 hours.   Radiology/Studies:  DG Chest Port 1 View  Result Date: 03/05/2021 CLINICAL DATA:  Tachycardia EXAM: PORTABLE CHEST 1 VIEW COMPARISON:  2016 FINDINGS: Right chest wall port catheter tip overlies SVC. No consolidation or edema. No pleural effusion or pneumothorax. Cardiomediastinal contours are within normal limits for technique. IMPRESSION: No acute process in the chest. Electronically Signed   By: Macy Mis M.D.   On: 03/05/2021 13:32     Assessment and Plan:   1. New onset atrial fibrillation - duration unclear, has had DOE/weakness for approximately 3 weeks but in the context of worsening anemia/chemotherapy was well so symptoms likely multifactorial - currently running HR around 105-11teens  on 10mg /hr of diltiazem, will give one time dose of 5mg  IV Lopressor to assess response (currently listed for clear liquid diet only - not clear that he is allowed to take oral meds yet given dysphagia) - neither chemotherapy agents are particularly implicated in atrial fib per literature review, so suspect possibly precipitated by AKI, dehydration, general metabolic stress of chemotherapy - CHADSVASC 3 - started on heparin per IM whose note indicates they reviewed with on-call oncology; anticipate transition to [facility] when  medically stable but need to follow CBC parameters carefully given anemia/thrombocytopenia - will discuss further rx with MD - echo pending - update TSH in AM  2. New RBBB - noted, no evidence of bradycardia - echo pending  3. Pancytopenia in the context of chemotherapy for [facility] - per primary team (new from 02/18/21)  4. AKI possibly secondary to dehydration - per primary team  5. Coronary calcification seen on CT 2016 - would likely hold ASA given need for [facility] - consider statin, can defer to OP setting when patient clinically improved - troponins negative and no recent angina  6. Essential HTN - manage in context above - only on diltiazem as OP  7. Remotely diagnosed PFO by coronary CT 2016 - no hx of stroke  Risk Assessment/Risk Scores:          CHA2DS2-VASc Score = 3   This indicates a 3.2% annual risk of stroke. The patient's score is based upon: CHF History: 0 HTN History: 1 Diabetes History: 0 Stroke History: 0 Vascular Disease History: 1 Age Score: 1 Gender Score: 0        For questions or updates, please contact [facility] Please consult [email or web address] for contact info under    Signed, Charlie Pitter, PA-C  03/06/2021 8:41 AM

## 2021-03-06 NOTE — Progress Notes (Signed)
Initial Nutrition Assessment  DOCUMENTATION CODES:   Obesity unspecified  INTERVENTION:  - will order 474 ml (2 cartons) Osmolite 1.5 TID with 180 ml free water per TF bolus and 45 ml Prosource TF BID. - this regimen will provide 2210 kcal, 111 grams protein, and 1626 ml free water.    NUTRITION DIAGNOSIS:   Increased nutrient needs related to cancer and cancer related treatments as evidenced by estimated needs.  GOAL:   Patient will meet greater than or equal to 90% of their needs  MONITOR:   PO intake, TF tolerance, Labs, Weight trends  REASON FOR ASSESSMENT:   Consult Enteral/tube feeding initiation and management  ASSESSMENT:   70 y.o. male with medical history of SCC neck on chemo and XRT, HTN, GERD, and hx of prostate cancer. He presented to the ED from the [place] with tachycardia.  Patient ordered CLD and reports having 2 licks of an [place] ice and putting some chicken broth through his PEG today. He is feeling very hungry.  Patient is followed by a RD at the [facility] and she most recently saw him yesterday.   Confirmed with patient that he is using 6 cartons (237 ml each) of Osmolite 1.5. He typically does 2 cartons at a time and does 120 ml free water before and 60 ml free water after each TF bolus.   This regimen provides 2130 kcal, 89 grams protein, and 1626 ml free water.   Patient reports pain with swallowing and that foods and beverages have an off, unpleasant taste.   Patient and wife, who is at bedside, share that affording TF has been a struggle for them. Case Manager aware as well. Case Manager and this RD to alert [place] RDs.   Weight yesterday was 198 lb and weight has been stable since at least 12/17/20.    Labs reviewed; Na: 134 mmol/l, Ca: 8 mg/dl.  Medications reviewed; 40 mg protonix per PEG/day.    NUTRITION - FOCUSED PHYSICAL EXAM:  Completed; no muscle or fat depletions, mild pitting edema to BLE.   Diet Order:    Diet Order             Diet clear liquid Room service appropriate? Yes; Fluid consistency: Thin  Diet effective now                   EDUCATION NEEDS:   No education needs have been identified at this time  Skin:  Skin Assessment: Reviewed RN Assessment  Last BM:  11/29 per flow sheet documentation  Height:   Ht Readings from Last 1 Encounters:  03/05/21 5\' 7"  (1.702 m)    Weight:   Wt Readings from Last 1 Encounters:  03/05/21 89.9 kg     Estimated Nutritional Needs:  Kcal:  2050-2300 kcal Protein:  105-120 grams Fluid:  >/= 2.1 L/day       Jarome Matin, MS, RD, LDN, CNSC Inpatient Clinical Dietitian RD pager # available in AMION  After hours/weekend pager # available in [facility]

## 2021-03-07 ENCOUNTER — Inpatient Hospital Stay (HOSPITAL_COMMUNITY): Payer: PPO

## 2021-03-07 ENCOUNTER — Ambulatory Visit
Admission: RE | Admit: 2021-03-07 | Discharge: 2021-03-07 | Disposition: A | Discharge status: Home / Self Care | Payer: PPO | Source: Ambulatory Visit | Attending: Radiation Oncology | Admitting: Radiation Oncology

## 2021-03-07 ENCOUNTER — Ambulatory Visit: Payer: PPO

## 2021-03-07 DIAGNOSIS — R112 Nausea with vomiting, unspecified: Secondary | ICD-10-CM | POA: Diagnosis not present

## 2021-03-07 DIAGNOSIS — I4891 Unspecified atrial fibrillation: Secondary | ICD-10-CM

## 2021-03-07 DIAGNOSIS — C760 Malignant neoplasm of head, face and neck: Secondary | ICD-10-CM | POA: Insufficient documentation

## 2021-03-07 DIAGNOSIS — R300 Dysuria: Secondary | ICD-10-CM | POA: Insufficient documentation

## 2021-03-07 LAB — COMPREHENSIVE METABOLIC PANEL
ALT: 25 U/L (ref 0–44)
AST: 14 U/L — ABNORMAL LOW (ref 15–41)
Albumin: 3.1 g/dL — ABNORMAL LOW (ref 3.5–5.0)
Alkaline Phosphatase: 27 U/L — ABNORMAL LOW (ref 38–126)
Anion gap: 5 (ref 5–15)
BUN: 17 mg/dL (ref 8–23)
CO2: 27 mmol/L (ref 22–32)
Calcium: 8.2 mg/dL — ABNORMAL LOW (ref 8.9–10.3)
Chloride: 103 mmol/L (ref 98–111)
Creatinine, Ser: 0.84 mg/dL (ref 0.61–1.24)
GFR, Estimated: >60 mL/min (ref >60)
Glucose, Bld: 107 mg/dL — ABNORMAL HIGH (ref 70–99)
Potassium: 4.2 mmol/L (ref 3.5–5.1)
Sodium: 135 mmol/L (ref 135–145)
Total Bilirubin: 0.7 mg/dL (ref 0.3–1.2)
Total Protein: 6.2 g/dL — ABNORMAL LOW (ref 6.5–8.1)

## 2021-03-07 LAB — GLUCOSE, CAPILLARY
Glucose-Capillary: 89 mg/dL (ref 70–99)
Glucose-Capillary: 152 mg/dL — ABNORMAL HIGH (ref 70–99)
Glucose-Capillary: 146 mg/dL — ABNORMAL HIGH (ref 70–99)
Glucose-Capillary: 164 mg/dL — ABNORMAL HIGH (ref 70–99)
Glucose-Capillary: 175 mg/dL — ABNORMAL HIGH (ref 70–99)

## 2021-03-07 LAB — ECHOCARDIOGRAM COMPLETE
Height: 67 in
S' Lateral: 2.6 cm
Weight: 3171.1 oz

## 2021-03-07 LAB — CBC WITH DIFFERENTIAL/PLATELET
Abs Immature Granulocytes: 0.01 10*3/uL (ref 0.00–0.07)
Basophils Absolute: 0 10*3/uL (ref 0.0–0.1)
Basophils Relative: 0 %
Eosinophils Absolute: 0 10*3/uL (ref 0.0–0.5)
Eosinophils Relative: 0 %
HCT: 27.9 % — ABNORMAL LOW (ref 39.0–52.0)
Hemoglobin: 9.2 g/dL — ABNORMAL LOW (ref 13.0–17.0)
Immature Granulocytes: 1 %
Lymphocytes Relative: 15 %
Lymphs Abs: 0.2 10*3/uL — ABNORMAL LOW (ref 0.7–4.0)
MCH: 31.6 pg (ref 26.0–34.0)
MCHC: 33 g/dL (ref 30.0–36.0)
MCV: 95.9 fL (ref 80.0–100.0)
Monocytes Absolute: 0.2 10*3/uL (ref 0.1–1.0)
Monocytes Relative: 16 %
Neutro Abs: 0.7 10*3/uL — ABNORMAL LOW (ref 1.7–7.7)
Neutrophils Relative %: 68 %
Platelets: 123 10*3/uL — ABNORMAL LOW (ref 150–400)
RBC: 2.91 MIL/uL — ABNORMAL LOW (ref 4.22–5.81)
RDW: 14.6 % (ref 11.5–15.5)
WBC: 1 10*3/uL — CL (ref 4.0–10.5)
nRBC: 0 % (ref 0.0–0.2)

## 2021-03-07 LAB — HEPARIN LEVEL (UNFRACTIONATED): Heparin Unfractionated: 0.5 IU/mL (ref 0.30–0.70)

## 2021-03-07 LAB — TSH: TSH: 0.5 u[IU]/mL (ref 0.350–4.500)

## 2021-03-07 NOTE — Progress Notes (Signed)
Progress Note  Patient Name: Nicholas Daniels Date of Encounter: 03/07/2021  Primary Cardiologist: Buford Dresser, MD  Subjective   No cardiac complaints of CP, SOB, palpitations but did have vomiting again earlier this AM. Currently using suction.  Inpatient Medications    Scheduled Meds:  chlorhexidine  15 mL Mouth/Throat QID   Chlorhexidine Gluconate Cloth  6 each Topical Daily   feeding supplement (OSMOLITE 1.5 CAL)  474 mL Per Tube TID   feeding supplement (PROSource TF)  45 mL Per Tube BID   free water  180 mL Per Tube TID   latanoprost  1 drop Both Eyes QHS   magic mouthwash w/lidocaine  5 mL Oral TID   pantoprazole sodium  40 mg Per Tube Q1200   sodium chloride flush  10-40 mL Intracatheter Q12H   timolol  1 drop Both Eyes Daily   Continuous Infusions:  diltiazem (CARDIZEM) infusion 10 mg/hr (03/07/21 0642)   heparin 1,100 Units/hr (03/06/21 2318)   PRN Meds: phenol, sodium chloride flush   Vital Signs    Vitals:   03/06/21 0623 03/06/21 1229 03/06/21 2207 03/07/21 0452  BP: 136/75 119/74 117/71 (!) 145/88  Pulse: (!) 106 86 89 (!) 114  Resp: 20 (!) 21 (!) 23 19  Temp: 98.1 F (36.7 C) 98.5 F (36.9 C) 98.1 F (36.7 C) 98.2 F (36.8 C)  TempSrc: Oral Oral Oral Oral  SpO2: 95% 95% 96% 97%  Weight:      Height:        Intake/Output Summary (Last 24 hours) at 03/07/2021 1004 Last data filed at 03/07/2021 0900 Gross per 24 hour  Intake 363.99 ml  Output 251 ml  Net 112.99 ml   Last 3 Weights 03/05/2021 03/05/2021 03/04/2021  Weight (lbs) 198 lb 3.1 oz 198 lb 6.4 oz 194 lb 8 oz  Weight (kg) 89.9 kg 89.994 kg 88.225 kg     Telemetry    Atrial fib rates 95-110 - Personally Reviewed  Physical Exam   GEN: No acute distress. Pale. HEENT: Normocephalic, atraumatic, sclera non-icteric. Neck: No JVD or bruits. Cardiac: Irregularly irregular, rate borderline elevated, no murmurs, rubs, or gallops.  Respiratory: Clear to auscultation  bilaterally. Breathing is unlabored. GI: Soft, nontender, non-distended, BS +x 4. MS: no deformity. Extremities: No clubbing or cyanosis. No edema. Distal pedal pulses are 2+ and equal bilaterally. Neuro:  AAOx3. Follows commands. Psych:  Responds to questions appropriately with a normal affect.  Labs    High Sensitivity Troponin:   Recent Labs  Lab 03/05/21 1225 03/05/21 1425  TROPONINIHS 11 12      Cardiac EnzymesNo results for input(s): TROPONINI in the last 168 hours. No results for input(s): TROPIPOC in the last 168 hours.   Chemistry Recent Labs  Lab 03/05/21 1225 03/06/21 0412 03/07/21 0619  NA 137 134* 135  K 4.4 4.2 4.2  CL 105 104 103  CO2 27 24 27   GLUCOSE 141* 89 107*  BUN 23 17 17   CREATININE 0.78 0.88 0.84  CALCIUM 8.1* 8.0* 8.2*  PROT 6.0* 5.9* 6.2*  ALBUMIN 3.1* 3.2* 3.1*  AST 17 17 14*  ALT 29 28 25   ALKPHOS 24* 26* 27*  BILITOT 0.5 0.9 0.7  GFRNONAA >60 >60 >60  ANIONGAP 5 6 5      Hematology Recent Labs  Lab 03/05/21 1225 03/06/21 0412 03/07/21 0619  WBC 0.8* 0.8* 1.0*  RBC 3.11* 2.93* 2.91*  HGB 9.7* 9.1* 9.2*  HCT 29.1* 27.6* 27.9*  MCV 93.6 94.2  95.9  MCH 31.2 31.1 31.6  MCHC 33.3 33.0 33.0  RDW 14.3 14.2 14.6  PLT 95* 108* 123*    BNPNo results for input(s): BNP, PROBNP in the last 168 hours.   DDimer No results for input(s): DDIMER in the last 168 hours.   Radiology    DG Chest Port 1 View  Result Date: 03/05/2021 CLINICAL DATA:  Tachycardia EXAM: PORTABLE CHEST 1 VIEW COMPARISON:  2016 FINDINGS: Right chest wall port catheter tip overlies SVC. No consolidation or edema. No pleural effusion or pneumothorax. Cardiomediastinal contours are within normal limits for technique. IMPRESSION: No acute process in the chest. Electronically Signed   By: Macy Mis M.D.   On: 03/05/2021 13:32    Cardiac Studies   2d echo pending  Patient Profile     70 y.o. 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 Admitted with dysphagia/mouth pain/soreness, vomiting, weakness for several weeks, found to have pancytopenia, AKI with elevated BUN, and new onset atrial fib/flutter.  Assessment & Plan    1. New onset atrial fibrillation/flutter - initial EKG suspicious for atrial flutter, subsequent tracings c/w atrial fib - suspect precipitated by dehydration, AKI, medical stressors - started on IV diltiazem and IV heparin - on diltiazem 10mg /hr, await clearance from IM to convert to oral meds - still with vomiting this AM. Has orders in place to titrate diltiazem further to 15mg  if needed - pending timing of transition to [facility] - having dysphagia secondary to radiation so getting tube feeds and clear liquids as tolerated - TEE/DCCV deferred at this time due to pancytopenia/mouth soreness with consideration of DCCV alone as outpatient after 3 weeks of rate control/anticoagulation (less risk of trauma if TEE not required) - echo pending - TSH wnl   2. New RBBB - noted, no evidence of bradycardia - echo pending   3. Pancytopenia in the context of chemotherapy for [facility], also here with AKI - per primary team (new from 02/18/21) - BUN improved   4. Coronary calcification seen on CT 2016 - would likely hold ASA given need for [facility] - consider statin, can defer to OP setting when patient clinically improved - troponins negative and no recent angina   5. Essential HTN - manage in context above - only on diltiazem as OP   6. Remotely diagnosed PFO by coronary CT 2016 - no hx of stroke  For questions or updates, please contact [place] Please consult [email or web address] for contact info under Cardiology/STEMI.  Signed, Charlie Pitter, PA-C 03/07/2021, 10:04 AM

## 2021-03-07 NOTE — Progress Notes (Signed)
  Echocardiogram 2D Echocardiogram has been performed.  Nicholas Daniels 03/07/2021, 3:50 PM

## 2021-03-07 NOTE — TOC Progression Note (Signed)
Transition of Care [facility]) - Progression Note    Patient Details  Name: Nicholas Daniels MRN: [id number] Date of Birth: 15-Feb-1951  Transition of Care [facility]) CM/SW Contact  Purcell Mouton, RN Phone Number: 03/07/2021, 12:03 PM  Clinical Narrative:    Spoke with Raford Pitcher, Dietician from [place] who states that pt's wife may come to [facility] to pick up a case of Osmolite. Mrs. Tinnell was called and explained this information with her. Mrs. Callegari will follow up.    Expected Discharge Plan: Home/Self Care Barriers to Discharge: No Barriers Identified  Expected Discharge Plan and Services Expected Discharge Plan: Home/Self Care       Living arrangements for the past 2 months: Single Family Home                                       Social Determinants of Health (SDOH) Interventions    Readmission Risk Interventions No flowsheet data found.

## 2021-03-07 NOTE — Progress Notes (Signed)
PROGRESS NOTE    Nicholas Daniels  ZOX:[id number] DOB: 1950/12/01 DOA: 03/05/2021 PCP: Leanna Battles, MD   Brief Narrative: Nicholas Daniels is a 70 y.o. 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 Presenting with tachycardia. He went to his onco appointment yesterday for radiation and chemo. When they set him up for chemo, the staff noticed that his HR was high. They gave him a fluid bolus and his HR lowered. They sent him home without incident. He returned today for radiation and chemo. When they set him up for chemo today, they noticed again that his HR was very high. They gave him another bolus, but his HR did not lower this time. They recommended that he come to the ED for help. He did not have any CP, dyspnea, or lightheadedness/dizziness during these episodes. He denies any other aggravating or alleviating factors.   ED Course: He was found to be tachycardic with HRs up to 160s. He was given fluid and OT cardizem IV. His rates slowed to 140s. EKG showed A fib RVR. He was placed on a cardizem gtt. TRH was called for admission.    Assessment & Plan:   Principal Problem:   Atrial fibrillation with RVR ([place])  #1 new onset afib likely precipitated by multifactorial reasons -dehydration and AKI and chemo-CHA2DS2-VASc score is 3  currently on IV diltiazem and IV heparin. 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    #6 mouth sores chlorhexidine mouthwash  #7 AKI resolved with IV fluids   Estimated body mass index is 31.04 kg/m as calculated from the following:   Height as of this encounter: 5\' 7"  (1.702 m).    Weight as of this encounter: 89.9 kg.  DVT prophylaxis: Heparin  code Status: Full code  family Communication: None at bedside  disposition Plan:  Status is: Inpatient  Remains inpatient appropriate because: On IV heparin and IV Cardizem   Consultants:  Cardiology  Procedures: None Antimicrobials: None  Subjective: He reports not having a good night Had 1 episode of vomiting reportedly Denies chest pain   on IV Cardizem and heparin  Objective: Vitals:   03/06/21 0623 03/06/21 1229 03/06/21 2207 03/07/21 0452  BP: 136/75 119/74 117/71 (!) 145/88  Pulse: (!) 106 86 89 (!) 114  Resp: 20 (!) 21 (!) 23 19  Temp: 98.1 F (36.7 C) 98.5 F (36.9 C) 98.1 F (36.7 C) 98.2 F (36.8 C)  TempSrc: Oral Oral Oral Oral  SpO2: 95% 95% 96% 97%  Weight:      Height:        Intake/Output Summary (Last 24 hours) at 03/07/2021 1236 Last data filed at 03/07/2021 0900 Gross per 24 hour  Intake 837.99 ml  Output 251 ml  Net 586.99 ml    Filed Weights   03/05/21 2209  Weight: 89.9 kg    Examination: Port right upper chest General exam: Appears calm and comfortable  Respiratory system: Clear to auscultation. Respiratory effort normal. Cardiovascular system: S1 & S2 heard, irregular irregular no JVD, murmurs, rubs, gallops or clicks. No pedal edema. Gastrointestinal system: Abdomen is nondistended, soft and nontender. No organomegaly or masses felt. Normal bowel sounds heard. Central nervous  system: Alert and oriented. No focal neurological deficits. Extremities: Trace edema Skin: No rashes, lesions or ulcers Psychiatry: Judgement and insight appear normal. Mood & affect appropriate.     Data Reviewed: I have personally reviewed following labs and imaging studies  CBC: Recent Labs  Lab 03/05/21 0815 03/05/21 1225 03/06/21 0412 03/07/21 0619  WBC 1.1* 0.8* 0.8* 1.0*  NEUTROABS 0.8* 0.6*  --  0.7*  HGB 9.9* 9.7* 9.1* 9.2*  HCT 29.1* 29.1* 27.6* 27.9*  MCV 91.8 93.6 94.2  95.9  PLT 117* 95* 108* 123*    Basic Metabolic Panel: Recent Labs  Lab 03/05/21 0815 03/05/21 1225 03/06/21 0412 03/07/21 0619  NA 135 137 134* 135  K 4.3 4.4 4.2 4.2  CL 103 105 104 103  CO2 26 27 24 27   GLUCOSE 111* 141* 89 107*  BUN 27* 23 17 17   CREATININE 1.06 0.78 0.88 0.84  CALCIUM 8.4* 8.1* 8.0* 8.2*  MG  --  1.9  --   --     GFR: Estimated Creatinine Clearance: 86.3 mL/min (by C-G formula based on SCr of 0.84 mg/dL). Liver Function Tests: Recent Labs  Lab 03/05/21 0815 03/05/21 1225 03/06/21 0412 03/07/21 0619  AST 17 17 17  14*  ALT 32 29 28 25   ALKPHOS 29* 24* 26* 27*  BILITOT 0.9 0.5 0.9 0.7  PROT 6.2* 6.0* 5.9* 6.2*  ALBUMIN 3.3* 3.1* 3.2* 3.1*    No results for input(s): LIPASE, AMYLASE in the last 168 hours. No results for input(s): AMMONIA in the last 168 hours. Coagulation Profile: No results for input(s): INR, PROTIME in the last 168 hours. Cardiac Enzymes: No results for input(s): CKTOTAL, CKMB, CKMBINDEX, TROPONINI in the last 168 hours. BNP (last 3 results) No results for input(s): PROBNP in the last 8760 hours. HbA1C: No results for input(s): HGBA1C in the last 72 hours. CBG: Recent Labs  Lab 03/06/21 2048 03/06/21 2321 03/07/21 0349 03/07/21 0922 03/07/21 1159  GLUCAP 173* 106* 89 152* 146*   Lipid Profile: No results for input(s): CHOL, HDL, LDLCALC, TRIG, CHOLHDL, LDLDIRECT in the last 72 hours. Thyroid Function Tests: Recent Labs    03/07/21 0619  TSH 0.500   Anemia Panel: No results for input(s): VITAMINB12, FOLATE, FERRITIN, TIBC, IRON, RETICCTPCT in the last 72 hours. Sepsis Labs: No results for input(s): PROCALCITON, LATICACIDVEN in the last 168 hours.  Recent Results (from the past 240 hour(s))  Resp Panel by RT-PCR (Flu A&B, Covid) Nasopharyngeal Swab     Status: None   Collection Time: 03/05/21  3:23 PM   Specimen: Nasopharyngeal Swab; Nasopharyngeal(NP) swabs in vial transport medium  Result Value Ref Range  Status   SARS Coronavirus 2 by RT PCR NEGATIVE NEGATIVE Final    Comment: (NOTE) SARS-CoV-2 target nucleic acids are NOT DETECTED.  The SARS-CoV-2 RNA is generally detectable in upper respiratory specimens during the acute phase of infection. The lowest concentration of SARS-CoV-2 viral copies this assay can detect is 138 copies/mL. A negative result does not preclude SARS-Cov-2 infection and should not be used as the sole basis for treatment or other patient management decisions. A negative result may occur with  improper specimen collection/handling, submission of specimen other than nasopharyngeal swab, presence of viral mutation(s) within the areas targeted by this assay, and inadequate number of viral copies(<138 copies/mL). A negative result must be combined with clinical observations, patient history, and epidemiological information. The expected result is Negative.  Fact Sheet for Patients:  EntrepreneurPulse.com.au  Fact Sheet for Healthcare Providers:  IncredibleEmployment.be  This test is no t yet approved or cleared by the [place] and  has been authorized for detection and/or diagnosis of SARS-CoV-2 by FDA under an Emergency Use Authorization (EUA). This EUA will remain  in effect (meaning this test can be used) for the duration of the COVID-19 declaration under Section 564(b)(1) of the Act, 21 U.S.C.section 360bbb-3(b)(1), unless the authorization is terminated  or revoked sooner.       Influenza A by PCR NEGATIVE NEGATIVE Final   Influenza B by PCR NEGATIVE NEGATIVE Final    Comment: (NOTE) The Xpert Xpress SARS-CoV-2/FLU/RSV plus assay is intended as an aid in the diagnosis of influenza from Nasopharyngeal swab specimens and should not be used as a sole basis for treatment. Nasal washings and aspirates are unacceptable for Xpert Xpress SARS-CoV-2/FLU/RSV testing.  Fact Sheet for  Patients: EntrepreneurPulse.com.au  Fact Sheet for Healthcare Providers: IncredibleEmployment.be  This test is not yet approved or cleared by the [place] FDA and has been authorized for detection and/or diagnosis of SARS-CoV-2 by FDA under an Emergency Use Authorization (EUA). This EUA will remain in effect (meaning this test can be used) for the duration of the COVID-19 declaration under Section 564(b)(1) of the Act, 21 U.S.C. section 360bbb-3(b)(1), unless the authorization is terminated or revoked.  Performed at [facility], [place] [address], [place], [place] [address]           Radiology Studies: [facility] Chest Port 1 View  Result Date: 03/05/2021 CLINICAL DATA:  Tachycardia EXAM: PORTABLE CHEST 1 VIEW COMPARISON:  2016 FINDINGS: Right chest wall port catheter tip overlies SVC. No consolidation or edema. No pleural effusion or pneumothorax. Cardiomediastinal contours are within normal limits for technique. IMPRESSION: No acute process in the chest. Electronically Signed   By: Macy Mis M.D.   On: 03/05/2021 13:32        Scheduled Meds:  chlorhexidine  15 mL Mouth/Throat QID   Chlorhexidine Gluconate Cloth  6 each Topical Daily   feeding supplement (OSMOLITE 1.5 CAL)  474 mL Per Tube TID   feeding supplement (PROSource TF)  45 mL Per Tube BID   free water  180 mL Per Tube TID   latanoprost  1 drop Both Eyes QHS   magic mouthwash w/lidocaine  5 mL Oral TID   pantoprazole sodium  40 mg Per Tube Q1200   sodium chloride flush  10-40 mL Intracatheter Q12H   timolol  1 drop Both Eyes Daily   Continuous Infusions:  diltiazem (CARDIZEM) infusion 10 mg/hr (03/07/21 9628)   heparin 1,100 Units/hr (03/06/21 2318)     LOS: 2 days   Georgette Shell, MD 03/07/2021, 12:36 PM

## 2021-03-07 NOTE — Progress Notes (Signed)
ANTICOAGULATION CONSULT NOTE   Pharmacy Consult for heparin Indication: atrial fibrillation  No Known Allergies  Patient Measurements: Height: 5\' 7"  (170.2 cm) Weight: 89.9 kg (198 lb 3.1 oz) IBW/kg (Calculated) : 66.1 Heparin Dosing Weight: 90kg  Vital Signs: Temp: 98.2 F (36.8 C) (12/01 0452) Temp Source: Oral (12/01 0452) BP: 145/88 (12/01 0452) Pulse Rate: 114 (12/01 0452)  Labs: Recent Labs    03/05/21 1225 03/05/21 1225 03/05/21 1425 03/05/21 2104 03/06/21 0412 03/06/21 1059 03/06/21 2132 03/07/21 0619  HGB 9.7*  --   --   --  9.1*  --   --  9.2*  HCT 29.1*  --   --   --  27.6*  --   --  27.9*  PLT 95*  --   --   --  108*  --   --  123*  APTT  --   --   --  20*  --   --   --   --   HEPARINUNFRC  --    < >  --   --  0.50 0.60 0.61 0.50  CREATININE 0.78  --   --   --  0.88  --   --  0.84  TROPONINIHS 11  --  12  --   --   --   --   --    < > = values in this interval not displayed.     Estimated Creatinine Clearance: 86.3 mL/min (by C-G formula based on SCr of 0.84 mg/dL).  Assessment: 70 yo male admitted with elevated HR, found to have new onset atrial fibrillation.  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  No anticoagulation noted PTA.  Pharmacy consulted to dose heparin per recommendations from [facility] and oncology. Per MD, target lower end of therapeutic goal given thrombocytopenia 2/2 chemotherapy.  03/07/2021 heparin level 0.5 after rate decreased to 1100 units/hr: within goal of 0.3-0.5 No reported bleeding Hg stable, PLT up to 123  Goal of Therapy:  Heparin level 0.3-0.5 units/ml per MD request due to above Monitor platelets by anticoagulation protocol: Yes   Plan:  Continue IV heparin rate @ 1100 units/hr Monitor daily CBC, daily HL Follow-up ability to transition to oral/per tube anticoagulation  Eudelia Bunch, Pharm.D 03/07/2021 8:07 AM

## 2021-03-08 ENCOUNTER — Inpatient Hospital Stay (HOSPITAL_COMMUNITY): Payer: PPO

## 2021-03-08 ENCOUNTER — Ambulatory Visit
Admission: RE | Admit: 2021-03-08 | Discharge: 2021-03-08 | Disposition: A | Discharge status: Home / Self Care | Payer: PPO | Source: Ambulatory Visit | Attending: Radiation Oncology | Admitting: Radiation Oncology

## 2021-03-08 DIAGNOSIS — I4891 Unspecified atrial fibrillation: Secondary | ICD-10-CM | POA: Diagnosis not present

## 2021-03-08 DIAGNOSIS — R112 Nausea with vomiting, unspecified: Secondary | ICD-10-CM | POA: Diagnosis not present

## 2021-03-08 LAB — COMPREHENSIVE METABOLIC PANEL
ALT: 21 U/L (ref 0–44)
AST: 13 U/L — ABNORMAL LOW (ref 15–41)
Albumin: 3.1 g/dL — ABNORMAL LOW (ref 3.5–5.0)
Alkaline Phosphatase: 30 U/L — ABNORMAL LOW (ref 38–126)
Anion gap: 4 — ABNORMAL LOW (ref 5–15)
BUN: 17 mg/dL (ref 8–23)
CO2: 29 mmol/L (ref 22–32)
Calcium: 8.4 mg/dL — ABNORMAL LOW (ref 8.9–10.3)
Chloride: 102 mmol/L (ref 98–111)
Creatinine, Ser: 0.78 mg/dL (ref 0.61–1.24)
GFR, Estimated: >60 mL/min (ref >60)
Glucose, Bld: 113 mg/dL — ABNORMAL HIGH (ref 70–99)
Potassium: 4.2 mmol/L (ref 3.5–5.1)
Sodium: 135 mmol/L (ref 135–145)
Total Bilirubin: 0.5 mg/dL (ref 0.3–1.2)
Total Protein: 6.1 g/dL — ABNORMAL LOW (ref 6.5–8.1)

## 2021-03-08 LAB — GLUCOSE, CAPILLARY
Glucose-Capillary: 112 mg/dL — ABNORMAL HIGH (ref 70–99)
Glucose-Capillary: 107 mg/dL — ABNORMAL HIGH (ref 70–99)
Glucose-Capillary: 99 mg/dL (ref 70–99)
Glucose-Capillary: 138 mg/dL — ABNORMAL HIGH (ref 70–99)
Glucose-Capillary: 161 mg/dL — ABNORMAL HIGH (ref 70–99)
Glucose-Capillary: 122 mg/dL — ABNORMAL HIGH (ref 70–99)

## 2021-03-08 LAB — CBC WITH DIFFERENTIAL/PLATELET
Abs Immature Granulocytes: 0 10*3/uL (ref 0.00–0.07)
Basophils Absolute: 0 10*3/uL (ref 0.0–0.1)
Basophils Relative: 0 %
Eosinophils Absolute: 0 10*3/uL (ref 0.0–0.5)
Eosinophils Relative: 0 %
HCT: 27.3 % — ABNORMAL LOW (ref 39.0–52.0)
Hemoglobin: 8.9 g/dL — ABNORMAL LOW (ref 13.0–17.0)
Immature Granulocytes: 0 %
Lymphocytes Relative: 21 %
Lymphs Abs: 0.2 10*3/uL — ABNORMAL LOW (ref 0.7–4.0)
MCH: 30.9 pg (ref 26.0–34.0)
MCHC: 32.6 g/dL (ref 30.0–36.0)
MCV: 94.8 fL (ref 80.0–100.0)
Monocytes Absolute: 0.1 10*3/uL (ref 0.1–1.0)
Monocytes Relative: 16 %
Neutro Abs: 0.4 10*3/uL — CL (ref 1.7–7.7)
Neutrophils Relative %: 63 %
Platelets: 127 10*3/uL — ABNORMAL LOW (ref 150–400)
RBC: 2.88 MIL/uL — ABNORMAL LOW (ref 4.22–5.81)
RDW: 15.1 % (ref 11.5–15.5)
WBC: 0.7 10*3/uL — CL (ref 4.0–10.5)
nRBC: 0 % (ref 0.0–0.2)

## 2021-03-08 LAB — HEPARIN LEVEL (UNFRACTIONATED)
Heparin Unfractionated: 0.29 IU/mL — ABNORMAL LOW (ref 0.30–0.70)
Heparin Unfractionated: 0.39 IU/mL (ref 0.30–0.70)

## 2021-03-08 MED ORDER — LACTULOSE 10 GM/15ML PO SOLN
20.0000 g | Freq: Two times a day (BID) | ORAL | Status: AC
  Administered 2021-03-08 (×2): 20 g
  Filled 2021-03-08 (×2): qty 30

## 2021-03-08 NOTE — Progress Notes (Addendum)
Progress Note  Patient Name: Nicholas Daniels Date of Encounter: 03/08/2021  Primary Cardiologist: Buford Dresser, MD  Subjective   Less nausea/vomiting, mouth not as sore. No chest pain, SOB, palpitations. Diltiazem titrated to 15mg /hr this AM.  Inpatient Medications    Scheduled Meds:  chlorhexidine  15 mL Mouth/Throat QID   Chlorhexidine Gluconate Cloth  6 each Topical Daily   feeding supplement (OSMOLITE 1.5 CAL)  474 mL Per Tube TID   feeding supplement (PROSource TF)  45 mL Per Tube BID   free water  180 mL Per Tube TID   latanoprost  1 drop Both Eyes QHS   magic mouthwash w/lidocaine  5 mL Oral TID   pantoprazole sodium  40 mg Per Tube Q1200   sodium chloride flush  10-40 mL Intracatheter Q12H   timolol  1 drop Both Eyes Daily   Continuous Infusions:  diltiazem (CARDIZEM) infusion 15 mg/hr (03/08/21 0738)   heparin 1,150 Units/hr (03/08/21 0546)   PRN Meds: phenol, sodium chloride flush   Vital Signs    Vitals:   03/07/21 1308 03/07/21 2003 03/07/21 2300 03/08/21 0625  BP: 111/73 (!) 146/120  101/65  Pulse: 91 (!) 126  79  Resp: 17 20 (!) 22   Temp: 98.4 F (36.9 C) 98.8 F (37.1 C)  98.3 F (36.8 C)  TempSrc: Oral Oral  Oral  SpO2: 96%   98%  Weight:      Height:        Intake/Output Summary (Last 24 hours) at 03/08/2021 0849 Last data filed at 03/08/2021 0316 Gross per 24 hour  Intake 248.7 ml  Output 600 ml  Net -351.3 ml   Last 3 Weights 03/05/2021 03/05/2021 03/04/2021  Weight (lbs) 198 lb 3.1 oz 198 lb 6.4 oz 194 lb 8 oz  Weight (kg) 89.9 kg 89.994 kg 88.225 kg     Telemetry    Atrial fib variable rates - Personally Reviewed  Physical Exam   GEN: No acute distress.  HEENT: Normocephalic, atraumatic, sclera non-icteric. Neck: No JVD or bruits. Cardiac: Irregularly irregular no murmurs, rubs, or gallops.  Respiratory: Clear to auscultation bilaterally. Breathing is unlabored. GI: Soft, nontender, non-distended, BS +x 4. MS: no  deformity. Extremities: No clubbing or cyanosis. No edema. Distal pedal pulses are 2+ and equal bilaterally. Neuro:  AAOx3. Follows commands. Psych:  Responds to questions appropriately with a normal affect.  Labs    High Sensitivity Troponin:   Recent Labs  Lab 03/05/21 1225 03/05/21 1425  TROPONINIHS 11 12      Cardiac EnzymesNo results for input(s): TROPONINI in the last 168 hours. No results for input(s): TROPIPOC in the last 168 hours.   Chemistry Recent Labs  Lab 03/06/21 0412 03/07/21 0619 03/08/21 0344  NA 134* 135 135  K 4.2 4.2 4.2  CL 104 103 102  CO2 24 27 29   GLUCOSE 89 107* 113*  BUN 17 17 17   CREATININE 0.88 0.84 0.78  CALCIUM 8.0* 8.2* 8.4*  PROT 5.9* 6.2* 6.1*  ALBUMIN 3.2* 3.1* 3.1*  AST 17 14* 13*  ALT 28 25 21   ALKPHOS 26* 27* 30*  BILITOT 0.9 0.7 0.5  GFRNONAA >60 >60 >60  ANIONGAP 6 5 4*     Hematology Recent Labs  Lab 03/06/21 0412 03/07/21 0619 03/08/21 0344  WBC 0.8* 1.0* 0.7*  RBC 2.93* 2.91* 2.88*  HGB 9.1* 9.2* 8.9*  HCT 27.6* 27.9* 27.3*  MCV 94.2 95.9 94.8  MCH 31.1 31.6 30.9  MCHC 33.0  33.0 32.6  RDW 14.2 14.6 15.1  PLT 108* 123* 127*    BNPNo results for input(s): BNP, PROBNP in the last 168 hours.   DDimer No results for input(s): DDIMER in the last 168 hours.   Radiology    ECHOCARDIOGRAM COMPLETE  Result Date: 03/07/2021    ECHOCARDIOGRAM REPORT   Patient Name:   Nicholas Daniels [facility] Date of Exam: 03/07/2021 Medical Rec #:  [id number]       Height:       67.0 in Accession #:    [id number]      Weight:       198.2 lb Date of Birth:  07-Mar-1951      BSA:          2.014 m Patient Age:    70 years        BP:           111/73 mmHg Patient Gender: M               HR:           113 bpm. Exam Location:  Inpatient Procedure: 2D Echo Indications:    atrial fibrillation  History:        Patient has no prior history of Echocardiogram examinations.                 Risk Factors:Hypertension.  Sonographer:    Singer  Referring Phys: [id number] [place]  1. Left ventricular ejection fraction, by estimation, is 55 to 60%. The left ventricle has normal function. The left ventricle has no regional wall motion abnormalities. Left ventricular diastolic function could not be evaluated.  2. Right ventricular systolic function is normal. The right ventricular size is normal. There is moderately elevated pulmonary artery systolic pressure.  3. The mitral valve is normal in structure. Mild mitral valve regurgitation. No evidence of mitral stenosis.  4. The aortic valve is tricuspid. Aortic valve regurgitation is not visualized. No aortic stenosis is present.  5. The inferior vena cava is dilated in size with <50% respiratory variability, suggesting right atrial pressure of 15 mmHg. Comparison(s): No prior Echocardiogram. FINDINGS  Left Ventricle: Left ventricular ejection fraction, by estimation, is 55 to 60%. The left ventricle has normal function. The left ventricle has no regional wall motion abnormalities. The left ventricular internal cavity size was normal in size. There is  no left ventricular hypertrophy. Left ventricular diastolic function could not be evaluated due to atrial fibrillation. Left ventricular diastolic function could not be evaluated. Right Ventricle: The right ventricular size is normal. Right ventricular systolic function is normal. There is moderately elevated pulmonary artery systolic pressure. The tricuspid regurgitant velocity is 3.16 m/s, and with an assumed right atrial pressure of 15 mmHg, the estimated right ventricular systolic pressure is 21.1 mmHg. Left Atrium: Left atrial size was normal in size. Right Atrium: Right atrial size was normal in size. Pericardium: There is no evidence of pericardial effusion. Mitral Valve: The mitral valve is normal in structure. Mild mitral valve regurgitation. No evidence of mitral valve stenosis. Tricuspid Valve: The tricuspid valve is normal in structure.  Tricuspid valve regurgitation is trivial. No evidence of tricuspid stenosis. Aortic Valve: The aortic valve is tricuspid. Aortic valve regurgitation is not visualized. No aortic stenosis is present. Pulmonic Valve: The pulmonic valve was normal in structure. Pulmonic valve regurgitation is trivial. No evidence of pulmonic stenosis. Aorta: The aortic root is normal in size and structure. Venous: The inferior vena  cava is dilated in size with less than 50% respiratory variability, suggesting right atrial pressure of 15 mmHg. IAS/Shunts: No atrial level shunt detected by color flow Doppler.  LEFT VENTRICLE PLAX 2D LVIDd:         4.10 cm LVIDs:         2.60 cm LV PW:         1.10 cm LV IVS:        0.90 cm LVOT diam:     1.90 cm LVOT Area:     2.84 cm  IVC IVC diam: 2.50 cm LEFT ATRIUM           Index        RIGHT ATRIUM           Index LA diam:      3.70 cm 1.84 cm/m   RA Area:     14.90 cm LA Vol (A2C): 57.4 ml 28.49 ml/m  RA Volume:   36.10 ml  17.92 ml/m   AORTA Ao Root diam: 3.10 cm Ao Asc diam:  2.90 cm TRICUSPID VALVE TR Peak grad:   39.9 mmHg TR Vmax:        316.00 cm/s  SHUNTS Systemic Diam: 1.90 cm Kirk Ruths MD Electronically signed by Kirk Ruths MD Signature Date/Time: 03/07/2021/4:45:29 PM    Final     Cardiac Studies   2D echo 03/07/21   1. Left ventricular ejection fraction, by estimation, is 55 to 60%. The  left ventricle has normal function. The left ventricle has no regional  wall motion abnormalities. Left ventricular diastolic function could not  be evaluated.   2. Right ventricular systolic function is normal. The right ventricular  size is normal. There is moderately elevated pulmonary artery systolic  pressure.   3. The mitral valve is normal in structure. Mild mitral valve  regurgitation. No evidence of mitral stenosis.   4. The aortic valve is tricuspid. Aortic valve regurgitation is not  visualized. No aortic stenosis is present.   5. The inferior vena cava is dilated  in size with <50% respiratory  variability, suggesting right atrial pressure of 15 mmHg.   Comparison(s): No prior Echocardiogram.   Patient Profile     70 y.o. 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 Admitted with dysphagia/mouth pain/soreness, vomiting, weakness for several weeks, found to have pancytopenia, AKI with elevated BUN, and new onset atrial fib/flutter.    Assessment & Plan    1. New onset atrial fibrillation/flutter - initial EKG suspicious for atrial flutter, subsequent tracings c/w atrial fib - suspect precipitated by dehydration, AKI, medical stressors - started on IV diltiazem and IV heparin  - TEE/DCCV deferred at this time due to pancytopenia/mouth soreness with consideration of DCCV alone as outpatient after 3 weeks of rate control/anticoagulation (less risk of trauma if TEE not required) -TSH wnl - echo with normal LV function, moderate pulm HTN, mild MR - consider OP sleep study for evaluation of OSA - care order written to increase diltiazem yesterday; HR slightly elevated this AM so increased this AM to 15mg /hr - > Hr now 80s-90s - can add IV Lopressor 2.5mg  q6hr if needed - pending timing of transition to oral regimen - having dysphagia secondary to radiation so getting tube feeds and clear liquids as tolerated, awaiting clearance by primary team  2. New RBBB - noted, no evidence of bradycardia   3. Pancytopenia in the context of chemotherapy for SCC (new from 02/18/21), also here with AKI, dysphagia - per primary team - BUN/Cr remain improved   4. Coronary calcification seen on CT 2016 - hold ASA given eventual need for [facility] - consider statin, can defer to OP setting when patient clinically improved - troponins negative and no recent angina   5.  Essential HTN - manage in context above - only on diltiazem as OP   6. Remotely diagnosed PFO by coronary CT 2016 - no hx of stroke  For questions or updates, please contact [place] Please consult [email or web address] for contact info under Cardiology/STEMI.  Signed, Charlie Pitter, PA-C 03/08/2021, 8:49 AM

## 2021-03-08 NOTE — Progress Notes (Signed)
Pt sitting at the edge of the bed and his HR went up between 115-140s. Cardizem drip adjusted to 15mg /hr. Will cont to monitor.

## 2021-03-08 NOTE — Progress Notes (Signed)
ANTICOAGULATION CONSULT NOTE   Pharmacy Consult for heparin Indication: atrial fibrillation  No Known Allergies  Patient Measurements: Height: 5\' 7"  (170.2 cm) Weight: 89.9 kg (198 lb 3.1 oz) IBW/kg (Calculated) : 66.1 Heparin Dosing Weight: 90kg  Vital Signs: Temp: 98.3 F (36.8 C) (12/02 0625) Temp Source: Oral (12/02 0625) BP: 101/65 (12/02 0625) Pulse Rate: 79 (12/02 0625)  Labs: Recent Labs    03/05/21 1225 03/05/21 1425 03/05/21 2104 03/06/21 0412 03/06/21 1059 03/07/21 0619 03/08/21 0344 03/08/21 1049  HGB 9.7*  --   --  9.1*  --  9.2* 8.9*  --   HCT 29.1*  --   --  27.6*  --  27.9* 27.3*  --   PLT 95*  --   --  108*  --  123* 127*  --   APTT  --   --  20*  --   --   --   --   --   HEPARINUNFRC  --   --   --  0.50   < > 0.50 0.29* 0.39  CREATININE 0.78  --   --  0.88  --  0.84 0.78  --   TROPONINIHS 11 12  --   --   --   --   --   --    < > = values in this interval not displayed.     Estimated Creatinine Clearance: 90.6 mL/min (by C-G formula based on SCr of 0.78 mg/dL).  Assessment: 70 yo male admitted with elevated HR, found to have new onset atrial fibrillation.  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  No anticoagulation noted PTA.  Pharmacy consulted to dose heparin per recommendations from [facility] and oncology. Per MD, target lower end of therapeutic goal given thrombocytopenia 2/2 chemotherapy.  03/08/2021 Heparin level now therapeutic after increasing heparin slightly from 1100 to 1150 units/hr early this AM No reported bleeding Hg stable, PLT 127 and improving  Goal of Therapy:  Heparin level 0.3-0.5 units/ml per MD request due to above Monitor platelets by anticoagulation protocol: Yes   Plan:  Continue IV heparin at current rate of 1150 units/hr Monitor daily CBC, daily HL Follow-up ability to transition to oral/per tube anticoagulation   Adrian Saran, PharmD, BCPS Secure Chat if ?s 03/08/2021 11:26  AM

## 2021-03-08 NOTE — Progress Notes (Signed)
ANTICOAGULATION CONSULT NOTE   Pharmacy Consult for heparin Indication: atrial fibrillation  No Known Allergies  Patient Measurements: Height: 5\' 7"  (170.2 cm) Weight: 89.9 kg (198 lb 3.1 oz) IBW/kg (Calculated) : 66.1 Heparin Dosing Weight: 90kg  Vital Signs: Temp: 98.8 F (37.1 C) (12/01 2003) Temp Source: Oral (12/01 2003) BP: 146/120 (12/01 2003) Pulse Rate: 126 (12/01 2003)  Labs: Recent Labs    03/05/21 1225 03/05/21 1425 03/05/21 2104 03/06/21 0412 03/06/21 1059 03/06/21 2132 03/07/21 0619 03/08/21 0344  HGB 9.7*  --   --  9.1*  --   --  9.2* 8.9*  HCT 29.1*  --   --  27.6*  --   --  27.9* 27.3*  PLT 95*  --   --  108*  --   --  123* 127*  APTT  --   --  20*  --   --   --   --   --   HEPARINUNFRC  --   --   --  0.50   < > 0.61 0.50 0.29*  CREATININE 0.78  --   --  0.88  --   --  0.84 0.78  TROPONINIHS 11 12  --   --   --   --   --   --    < > = values in this interval not displayed.     Estimated Creatinine Clearance: 90.6 mL/min (by C-G formula based on SCr of 0.78 mg/dL).  Assessment: 70 yo male admitted with elevated HR, found to have new onset atrial fibrillation.  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  No anticoagulation noted PTA.  Pharmacy consulted to dose heparin per recommendations from [facility] and oncology. Per MD, target lower end of therapeutic goal given thrombocytopenia 2/2 chemotherapy.  03/08/2021 heparin level 0.29 on 1100 units/hr- goal of 0.3-0.5 No reported bleeding Hg stable, PLT up to 127  Goal of Therapy:  Heparin level 0.3-0.5 units/ml per MD request due to above Monitor platelets by anticoagulation protocol: Yes   Plan:  Increase heparin slightly to 1150 units/hr Check heparin level in 6 hours Monitor daily CBC, daily HL Follow-up ability to transition to oral/per tube anticoagulation  Dolly Rias RPh 03/08/2021, 5:05 AM

## 2021-03-08 NOTE — Progress Notes (Signed)
PROGRESS NOTE    Nicholas Daniels  OTL:[id number] DOB: 1950/11/11 DOA: 03/05/2021 PCP: Leanna Battles, MD   Brief Narrative: Nicholas Daniels is a 70 y.o. 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 Presenting with tachycardia. He went to his onco appointment yesterday for radiation and chemo. When they set him up for chemo, the staff noticed that his HR was high. They gave him a fluid bolus and his HR lowered. They sent him home without incident. He returned today for radiation and chemo. When they set him up for chemo today, they noticed again that his HR was very high. They gave him another bolus, but his HR did not lower this time. They recommended that he come to the ED for help. He did not have any CP, dyspnea, or lightheadedness/dizziness during these episodes. He denies any other aggravating or alleviating factors.   ED Course: He was found to be tachycardic with HRs up to 160s. He was given fluid and OT cardizem IV. His rates slowed to 140s. EKG showed A fib RVR. He was placed on a cardizem gtt. TRH was called for admission.    Assessment & Plan:   Principal Problem:   Atrial fibrillation with RVR ([place])  #1 new onset afib likely precipitated by multifactorial reasons -dehydration and AKI and chemo-CHA2DS2-VASc score is 3  currently on IV diltiazem and IV heparin. Echo -ejection fraction 55 to 60%.  No regional wall motion abnormalities.  Right ventricular size is normal.  No evidence of MS or AS. TSH NL  #2 htn stable 111/73 continue Cardizem   # 3 GERD/dysphagia secondary to radiation improving continue tube feeds and clear liquids as tolerated Chlorhexidine mouthwash Chloraseptic throat spray Magic mouthwash  #4 squamous cell carcinoma of the neck followed by Dr. Alen Blew  #5-pancytopenia/neutropenia chemo related-follow closely.  Platelets improving.  Hemoglobin improving.  Still neutropenic.  #6 mouth sores chlorhexidine  mouthwash  #7 AKI resolved with IV fluids   Estimated body mass index is 31.04 kg/m as calculated from the following:   Height as of this encounter: 5\' 7"  (1.702 m).   Weight as of this encounter: 89.9 kg.  DVT prophylaxis: Heparin  code Status: Full code  family Communication: None at bedside  disposition Plan:  Status is: Inpatient  Remains inpatient appropriate because: On IV heparin and IV Cardizem   Consultants:  Cardiology  Procedures: None Antimicrobials: None  Subjective: Patient is resting in bed he reports his mouth is better He is anxious to eat something today Still on IV heparin and Cardizem IV Denies chest pain shortness of breath or palpitations Chest xray 11/29 negative C/o cough Objective: Vitals:   03/07/21 2003 03/07/21 2300 03/08/21 0625 03/08/21 1257  BP: (!) 146/120  101/65 112/63  Pulse: (!) 126  79 92  Resp: 20 (!) 22  18  Temp: 98.8 F (37.1 C)  98.3 F (36.8 C) 98.6 F (37 C)  TempSrc: Oral  Oral Oral  SpO2:   98% 96%  Weight:      Height:        Intake/Output Summary (Last 24 hours) at 03/08/2021 1345 Last data filed at 03/08/2021 0316 Gross per 24 hour  Intake 248.7 ml  Output 600 ml  Net -351.3 ml    Filed Weights   03/05/21 2209  Weight: 89.9 kg    Examination: Port right upper chest General exam: Appears calm and comfortable  Respiratory system: Clear to auscultation. Respiratory effort normal. Cardiovascular system:  S1 & S2 heard, irregular irregular no JVD, murmurs, rubs, gallops or clicks. No pedal edema. Gastrointestinal system: Abdomen is nondistended, soft and nontender. No organomegaly or masses felt. Normal bowel sounds heard.peg in place. Central nervous system: Alert and oriented. No focal neurological deficits. Extremities: Trace edema Skin: No rashes, lesions or ulcers Psychiatry: Judgement and insight appear normal. Mood & affect appropriate.     Data Reviewed: I have personally reviewed following labs  and imaging studies  CBC: Recent Labs  Lab 03/05/21 0815 03/05/21 1225 03/06/21 0412 03/07/21 0619 03/08/21 0344  WBC 1.1* 0.8* 0.8* 1.0* 0.7*  NEUTROABS 0.8* 0.6*  --  0.7* 0.4*  HGB 9.9* 9.7* 9.1* 9.2* 8.9*  HCT 29.1* 29.1* 27.6* 27.9* 27.3*  MCV 91.8 93.6 94.2 95.9 94.8  PLT 117* 95* 108* 123* 127*    Basic Metabolic Panel: Recent Labs  Lab 03/05/21 0815 03/05/21 1225 03/06/21 0412 03/07/21 0619 03/08/21 0344  NA 135 137 134* 135 135  K 4.3 4.4 4.2 4.2 4.2  CL 103 105 104 103 102  CO2 26 27 24 27 29   GLUCOSE 111* 141* 89 107* 113*  BUN 27* 23 17 17 17   CREATININE 1.06 0.78 0.88 0.84 0.78  CALCIUM 8.4* 8.1* 8.0* 8.2* 8.4*  MG  --  1.9  --   --   --     GFR: Estimated Creatinine Clearance: 90.6 mL/min (by C-G formula based on SCr of 0.78 mg/dL). Liver Function Tests: Recent Labs  Lab 03/05/21 0815 03/05/21 1225 03/06/21 0412 03/07/21 0619 03/08/21 0344  AST 17 17 17  14* 13*  ALT 32 29 28 25 21   ALKPHOS 29* 24* 26* 27* 30*  BILITOT 0.9 0.5 0.9 0.7 0.5  PROT 6.2* 6.0* 5.9* 6.2* 6.1*  ALBUMIN 3.3* 3.1* 3.2* 3.1* 3.1*    No results for input(s): LIPASE, AMYLASE in the last 168 hours. No results for input(s): AMMONIA in the last 168 hours. Coagulation Profile: No results for input(s): INR, PROTIME in the last 168 hours. Cardiac Enzymes: No results for input(s): CKTOTAL, CKMB, CKMBINDEX, TROPONINI in the last 168 hours. BNP (last 3 results) No results for input(s): PROBNP in the last 8760 hours. HbA1C: No results for input(s): HGBA1C in the last 72 hours. CBG: Recent Labs  Lab 03/07/21 2221 03/08/21 0223 03/08/21 0609 03/08/21 0740 03/08/21 1144  GLUCAP 175* 112* 107* 99 138*    Lipid Profile: No results for input(s): CHOL, HDL, LDLCALC, TRIG, CHOLHDL, LDLDIRECT in the last 72 hours. Thyroid Function Tests: Recent Labs    03/07/21 0619  TSH 0.500    Anemia Panel: No results for input(s): VITAMINB12, FOLATE, FERRITIN, TIBC, IRON,  RETICCTPCT in the last 72 hours. Sepsis Labs: No results for input(s): PROCALCITON, LATICACIDVEN in the last 168 hours.  Recent Results (from the past 240 hour(s))  Resp Panel by RT-PCR (Flu A&B, Covid) Nasopharyngeal Swab     Status: None   Collection Time: 03/05/21  3:23 PM   Specimen: Nasopharyngeal Swab; Nasopharyngeal(NP) swabs in vial transport medium  Result Value Ref Range Status   SARS Coronavirus 2 by RT PCR NEGATIVE NEGATIVE Final    Comment: (NOTE) SARS-CoV-2 target nucleic acids are NOT DETECTED.  The SARS-CoV-2 RNA is generally detectable in upper respiratory specimens during the acute phase of infection. The lowest concentration of SARS-CoV-2 viral copies this assay can detect is 138 copies/mL. A negative result does not preclude SARS-Cov-2 infection and should not be used as the sole basis for treatment or other patient management  decisions. A negative result may occur with  improper specimen collection/handling, submission of specimen other than nasopharyngeal swab, presence of viral mutation(s) within the areas targeted by this assay, and inadequate number of viral copies(<138 copies/mL). A negative result must be combined with clinical observations, patient history, and epidemiological information. The expected result is Negative.  Fact Sheet for Patients:  EntrepreneurPulse.com.au  Fact Sheet for Healthcare Providers:  IncredibleEmployment.be  This test is no t yet approved or cleared by the [place] FDA and  has been authorized for detection and/or diagnosis of SARS-CoV-2 by FDA under an Emergency Use Authorization (EUA). This EUA will remain  in effect (meaning this test can be used) for the duration of the COVID-19 declaration under Section 564(b)(1) of the Act, 21 U.S.C.section 360bbb-3(b)(1), unless the authorization is terminated  or revoked sooner.       Influenza A by PCR NEGATIVE NEGATIVE Final   Influenza  B by PCR NEGATIVE NEGATIVE Final    Comment: (NOTE) The Xpert Xpress SARS-CoV-2/FLU/RSV plus assay is intended as an aid in the diagnosis of influenza from Nasopharyngeal swab specimens and should not be used as a sole basis for treatment. Nasal washings and aspirates are unacceptable for Xpert Xpress SARS-CoV-2/FLU/RSV testing.  Fact Sheet for Patients: EntrepreneurPulse.com.au  Fact Sheet for Healthcare Providers: IncredibleEmployment.be  This test is not yet approved or cleared by the [place] FDA and has been authorized for detection and/or diagnosis of SARS-CoV-2 by FDA under an Emergency Use Authorization (EUA). This EUA will remain in effect (meaning this test can be used) for the duration of the COVID-19 declaration under Section 564(b)(1) of the Act, 21 U.S.C. section 360bbb-3(b)(1), unless the authorization is terminated or revoked.  Performed at [facility], [place] [address] [place], [place] [address]           Radiology Studies: ECHOCARDIOGRAM COMPLETE  Result Date: 03/07/2021    ECHOCARDIOGRAM REPORT   Patient Name:   Nicholas Daniels [facility] Date of Exam: 03/07/2021 Medical Rec #:  [id number]       Height:       67.0 in Accession #:    [id number]      Weight:       198.2 lb Date of Birth:  1950/12/25      BSA:          2.014 m Patient Age:    79 years        BP:           111/73 mmHg Patient Gender: M               HR:           113 bpm. Exam Location:  Inpatient Procedure: 2D Echo Indications:    atrial fibrillation  History:        Patient has no prior history of Echocardiogram examinations.                 Risk Factors:Hypertension.  Sonographer:    [place] Referring Phys: [id number] [place]  1. Left ventricular ejection fraction, by estimation, is 55 to 60%. The left ventricle has normal function. The left ventricle has no regional wall motion abnormalities. Left ventricular diastolic  function could not be evaluated.  2. Right ventricular systolic function is normal. The right ventricular size is normal. There is moderately elevated pulmonary artery systolic pressure.  3. The mitral valve is normal in structure. Mild mitral valve regurgitation. No evidence of mitral  stenosis.  4. The aortic valve is tricuspid. Aortic valve regurgitation is not visualized. No aortic stenosis is present.  5. The inferior vena cava is dilated in size with <50% respiratory variability, suggesting right atrial pressure of 15 mmHg. Comparison(s): No prior Echocardiogram. FINDINGS  Left Ventricle: Left ventricular ejection fraction, by estimation, is 55 to 60%. The left ventricle has normal function. The left ventricle has no regional wall motion abnormalities. The left ventricular internal cavity size was normal in size. There is  no left ventricular hypertrophy. Left ventricular diastolic function could not be evaluated due to atrial fibrillation. Left ventricular diastolic function could not be evaluated. Right Ventricle: The right ventricular size is normal. Right ventricular systolic function is normal. There is moderately elevated pulmonary artery systolic pressure. The tricuspid regurgitant velocity is 3.16 m/s, and with an assumed right atrial pressure of 15 mmHg, the estimated right ventricular systolic pressure is 76.1 mmHg. Left Atrium: Left atrial size was normal in size. Right Atrium: Right atrial size was normal in size. Pericardium: There is no evidence of pericardial effusion. Mitral Valve: The mitral valve is normal in structure. Mild mitral valve regurgitation. No evidence of mitral valve stenosis. Tricuspid Valve: The tricuspid valve is normal in structure. Tricuspid valve regurgitation is trivial. No evidence of tricuspid stenosis. Aortic Valve: The aortic valve is tricuspid. Aortic valve regurgitation is not visualized. No aortic stenosis is present. Pulmonic Valve: The pulmonic valve was normal in  structure. Pulmonic valve regurgitation is trivial. No evidence of pulmonic stenosis. Aorta: The aortic root is normal in size and structure. Venous: The inferior vena cava is dilated in size with less than 50% respiratory variability, suggesting right atrial pressure of 15 mmHg. IAS/Shunts: No atrial level shunt detected by color flow Doppler.  LEFT VENTRICLE PLAX 2D LVIDd:         4.10 cm LVIDs:         2.60 cm LV PW:         1.10 cm LV IVS:        0.90 cm LVOT diam:     1.90 cm LVOT Area:     2.84 cm  IVC IVC diam: 2.50 cm LEFT ATRIUM           Index        RIGHT ATRIUM           Index LA diam:      3.70 cm 1.84 cm/m   RA Area:     14.90 cm LA Vol (A2C): 57.4 ml 28.49 ml/m  RA Volume:   36.10 ml  17.92 ml/m   AORTA Ao Root diam: 3.10 cm Ao Asc diam:  2.90 cm TRICUSPID VALVE TR Peak grad:   39.9 mmHg TR Vmax:        316.00 cm/s  SHUNTS Systemic Diam: 1.90 cm Kirk Ruths MD Electronically signed by Kirk Ruths MD Signature Date/Time: 03/07/2021/4:45:29 PM    Final         Scheduled Meds:  chlorhexidine  15 mL Mouth/Throat QID   Chlorhexidine Gluconate Cloth  6 each Topical Daily   feeding supplement (OSMOLITE 1.5 CAL)  474 mL Per Tube TID   feeding supplement (PROSource TF)  45 mL Per Tube BID   free water  180 mL Per Tube TID   latanoprost  1 drop Both Eyes QHS   magic mouthwash w/lidocaine  5 mL Oral TID   pantoprazole sodium  40 mg Per Tube Q1200   sodium chloride flush  10-40  mL Intracatheter Q12H   timolol  1 drop Both Eyes Daily   Continuous Infusions:  diltiazem (CARDIZEM) infusion 15 mg/hr (03/08/21 1238)   heparin 1,150 Units/hr (03/08/21 0546)     LOS: 3 days   Georgette Shell, MD 03/08/2021, 1:45 PM

## 2021-03-09 LAB — GLUCOSE, CAPILLARY
Glucose-Capillary: 103 mg/dL — ABNORMAL HIGH (ref 70–99)
Glucose-Capillary: 104 mg/dL — ABNORMAL HIGH (ref 70–99)
Glucose-Capillary: 130 mg/dL — ABNORMAL HIGH (ref 70–99)
Glucose-Capillary: 131 mg/dL — ABNORMAL HIGH (ref 70–99)
Glucose-Capillary: 153 mg/dL — ABNORMAL HIGH (ref 70–99)
Glucose-Capillary: 158 mg/dL — ABNORMAL HIGH (ref 70–99)

## 2021-03-09 LAB — COMPREHENSIVE METABOLIC PANEL
ALT: 22 U/L (ref 0–44)
AST: 15 U/L (ref 15–41)
Albumin: 3.2 g/dL — ABNORMAL LOW (ref 3.5–5.0)
Alkaline Phosphatase: 29 U/L — ABNORMAL LOW (ref 38–126)
Anion gap: 5 (ref 5–15)
BUN: 16 mg/dL (ref 8–23)
CO2: 29 mmol/L (ref 22–32)
Calcium: 8.4 mg/dL — ABNORMAL LOW (ref 8.9–10.3)
Chloride: 100 mmol/L (ref 98–111)
Creatinine, Ser: 0.84 mg/dL (ref 0.61–1.24)
GFR, Estimated: >60 mL/min (ref >60)
Glucose, Bld: 115 mg/dL — ABNORMAL HIGH (ref 70–99)
Potassium: 4.1 mmol/L (ref 3.5–5.1)
Sodium: 134 mmol/L — ABNORMAL LOW (ref 135–145)
Total Bilirubin: 0.8 mg/dL (ref 0.3–1.2)
Total Protein: 5.9 g/dL — ABNORMAL LOW (ref 6.5–8.1)

## 2021-03-09 LAB — CBC WITH DIFFERENTIAL/PLATELET
Abs Immature Granulocytes: 0 10*3/uL (ref 0.00–0.07)
Basophils Absolute: 0 10*3/uL (ref 0.0–0.1)
Basophils Relative: 0 %
Eosinophils Absolute: 0 10*3/uL (ref 0.0–0.5)
Eosinophils Relative: 1 %
HCT: 27.2 % — ABNORMAL LOW (ref 39.0–52.0)
Hemoglobin: 8.9 g/dL — ABNORMAL LOW (ref 13.0–17.0)
Immature Granulocytes: 0 %
Lymphocytes Relative: 16 %
Lymphs Abs: 0.1 10*3/uL — ABNORMAL LOW (ref 0.7–4.0)
MCH: 31.1 pg (ref 26.0–34.0)
MCHC: 32.7 g/dL (ref 30.0–36.0)
MCV: 95.1 fL (ref 80.0–100.0)
Monocytes Absolute: 0.2 10*3/uL (ref 0.1–1.0)
Monocytes Relative: 26 %
Neutro Abs: 0.5 10*3/uL — ABNORMAL LOW (ref 1.7–7.7)
Neutrophils Relative %: 57 %
Platelets: 141 10*3/uL — ABNORMAL LOW (ref 150–400)
RBC: 2.86 MIL/uL — ABNORMAL LOW (ref 4.22–5.81)
RDW: 15.7 % — ABNORMAL HIGH (ref 11.5–15.5)
WBC: 0.8 10*3/uL — CL (ref 4.0–10.5)
nRBC: 2.4 % — ABNORMAL HIGH (ref 0.0–0.2)

## 2021-03-09 LAB — HEPARIN LEVEL (UNFRACTIONATED): Heparin Unfractionated: 0.37 IU/mL (ref 0.30–0.70)

## 2021-03-09 MED ORDER — METOPROLOL TARTRATE 5 MG/5ML IV SOLN
2.5000 mg | Freq: Four times a day (QID) | INTRAVENOUS | Status: DC
Start: 2021-03-09 — End: 2021-03-10
  Administered 2021-03-09 – 2021-03-10 (×4): 2.5 mg via INTRAVENOUS
  Filled 2021-03-09 (×4): qty 5

## 2021-03-09 NOTE — Progress Notes (Signed)
PROGRESS NOTE    Nicholas Daniels  NFA:[id number] DOB: 10/01/1950 DOA: 03/05/2021 PCP: Leanna Battles, MD   Brief Narrative: Nicholas Daniels is a 70 y.o. 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 Presenting with tachycardia. He went to his onco appointment yesterday for radiation and chemo. When they set him up for chemo, the staff noticed that his HR was high. They gave him a fluid bolus and his HR lowered. They sent him home without incident. He returned today for radiation and chemo. When they set him up for chemo today, they noticed again that his HR was very high. They gave him another bolus, but his HR did not lower this time. They recommended that he come to the ED for help. He did not have any CP, dyspnea, or lightheadedness/dizziness during these episodes. He denies any other aggravating or alleviating factors.   ED Course: He was found to be tachycardic with HRs up to 160s. He was given fluid and OT cardizem IV. His rates slowed to 140s. EKG showed A fib RVR. He was placed on a cardizem gtt. TRH was called for admission.    Assessment & Plan:   Principal Problem:   Atrial fibrillation with RVR ([place])  #1 new onset afib likely precipitated by multifactorial reasons -dehydration and AKI and chemo-CHA2DS2-VASc score is 3  currently on IV diltiazem and IV heparin. Echo -ejection fraction 55 to 60%.  No regional wall motion abnormalities.  Right ventricular size is normal.  No evidence of MS or AS. TSH NL Low-dose metoprolol started by cardiology today.  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 Alen Blew  #5-pancytopenia/neutropenia chemo related-follow closely.  Platelets improving.  Hemoglobin improving.  Still  neutropenic.  #6 mouth sores chlorhexidine mouthwash  #7 AKI resolved with IV fluids  #8 constipation resolved with lactulose.   Estimated body mass index is 31.08 kg/m as calculated from the following:   Height as of this encounter: 5\' 7"  (1.702 m).   Weight as of this encounter: 90 kg.  DVT prophylaxis: Heparin  code Status: Full code  family Communication: None at bedside  disposition Plan:  Status is: Inpatient  Remains inpatient appropriate because: On IV heparin and IV Cardizem   Consultants:  Cardiology  Procedures: None Antimicrobials: None  Subjective: Resting in bed  No real po intake On tube feeds  HR better today compared to yesterday   Objective: Vitals:   03/08/21 1257 03/08/21 2100 03/09/21 0500 03/09/21 1347  BP: 112/63 (!) 146/76  118/75  Pulse: 92 97    Resp: 18 18  20   Temp: 98.6 F (37 C) 98.5 F (36.9 C)  99.3 F (37.4 C)  TempSrc: Oral Oral  Oral  SpO2: 96% 100%  97%  Weight:   90 kg   Height:        Intake/Output Summary (Last 24 hours) at 03/09/2021 1353 Last data filed at 03/09/2021 1100 Gross per 24 hour  Intake --  Output 1803 ml  Net -1803 ml    Filed Weights   03/05/21 2209 03/09/21 0500  Weight: 89.9 kg 90 kg    Examination: Port right upper chest General exam: Appears calm and comfortable  Respiratory system: Clear to auscultation. Respiratory effort normal. Cardiovascular system: S1 & S2 heard, irregular  irregular no JVD, murmurs, rubs, gallops or clicks. No pedal edema. Gastrointestinal system: Abdomen is nondistended, soft and nontender. No organomegaly or masses felt. Normal bowel sounds heard.peg in place. Central nervous system: Alert and oriented. No focal neurological deficits. Extremities: Trace edema Skin: No rashes, lesions or ulcers Psychiatry: Judgement and insight appear normal. Mood & affect appropriate.     Data Reviewed: I have personally reviewed following labs and imaging studies  CBC: Recent  Labs  Lab 03/05/21 0815 03/05/21 1225 03/06/21 0412 03/07/21 0619 03/08/21 0344 03/09/21 0338  WBC 1.1* 0.8* 0.8* 1.0* 0.7* 0.8*  NEUTROABS 0.8* 0.6*  --  0.7* 0.4* 0.5*  HGB 9.9* 9.7* 9.1* 9.2* 8.9* 8.9*  HCT 29.1* 29.1* 27.6* 27.9* 27.3* 27.2*  MCV 91.8 93.6 94.2 95.9 94.8 95.1  PLT 117* 95* 108* 123* 127* 141*    Basic Metabolic Panel: Recent Labs  Lab 03/05/21 1225 03/06/21 0412 03/07/21 0619 03/08/21 0344 03/09/21 0338  NA 137 134* 135 135 134*  K 4.4 4.2 4.2 4.2 4.1  CL 105 104 103 102 100  CO2 27 24 27 29 29   GLUCOSE 141* 89 107* 113* 115*  BUN 23 17 17 17 16   CREATININE 0.78 0.88 0.84 0.78 0.84  CALCIUM 8.1* 8.0* 8.2* 8.4* 8.4*  MG 1.9  --   --   --   --     GFR: Estimated Creatinine Clearance: 86.4 mL/min (by C-G formula based on SCr of 0.84 mg/dL). Liver Function Tests: Recent Labs  Lab 03/05/21 1225 03/06/21 0412 03/07/21 0619 03/08/21 0344 03/09/21 0338  AST 17 17 14* 13* 15  ALT 29 28 25 21 22   ALKPHOS 24* 26* 27* 30* 29*  BILITOT 0.5 0.9 0.7 0.5 0.8  PROT 6.0* 5.9* 6.2* 6.1* 5.9*  ALBUMIN 3.1* 3.2* 3.1* 3.1* 3.2*    No results for input(s): LIPASE, AMYLASE in the last 168 hours. No results for input(s): AMMONIA in the last 168 hours. Coagulation Profile: No results for input(s): INR, PROTIME in the last 168 hours. Cardiac Enzymes: No results for input(s): CKTOTAL, CKMB, CKMBINDEX, TROPONINI in the last 168 hours. BNP (last 3 results) No results for input(s): PROBNP in the last 8760 hours. HbA1C: No results for input(s): HGBA1C in the last 72 hours. CBG: Recent Labs  Lab 03/08/21 1748 03/08/21 2146 03/09/21 0521 03/09/21 0738 03/09/21 1145  GLUCAP 161* 122* 103* 104* 131*    Lipid Profile: No results for input(s): CHOL, HDL, LDLCALC, TRIG, CHOLHDL, LDLDIRECT in the last 72 hours. Thyroid Function Tests: Recent Labs    03/07/21 0619  TSH 0.500    Anemia Panel: No results for input(s): VITAMINB12, FOLATE, FERRITIN, TIBC,  IRON, RETICCTPCT in the last 72 hours. Sepsis Labs: No results for input(s): PROCALCITON, LATICACIDVEN in the last 168 hours.  Recent Results (from the past 240 hour(s))  Resp Panel by RT-PCR (Flu A&B, Covid) Nasopharyngeal Swab     Status: None   Collection Time: 03/05/21  3:23 PM   Specimen: Nasopharyngeal Swab; Nasopharyngeal(NP) swabs in vial transport medium  Result Value Ref Range Status   SARS Coronavirus 2 by RT PCR NEGATIVE NEGATIVE Final    Comment: (NOTE) SARS-CoV-2 target nucleic acids are NOT DETECTED.  The SARS-CoV-2 RNA is generally detectable in upper respiratory specimens during the acute phase of infection. The lowest concentration of SARS-CoV-2 viral copies this assay can detect is 138 copies/mL. A negative result does not preclude SARS-Cov-2 infection and should not be used as the sole basis for treatment or  other patient management decisions. A negative result may occur with  improper specimen collection/handling, submission of specimen other than nasopharyngeal swab, presence of viral mutation(s) within the areas targeted by this assay, and inadequate number of viral copies(<138 copies/mL). A negative result must be combined with clinical observations, patient history, and epidemiological information. The expected result is Negative.  Fact Sheet for Patients:  EntrepreneurPulse.com.au  Fact Sheet for Healthcare Providers:  IncredibleEmployment.be  This test is no t yet approved or cleared by the [place] FDA and  has been authorized for detection and/or diagnosis of SARS-CoV-2 by FDA under an Emergency Use Authorization (EUA). This EUA will remain  in effect (meaning this test can be used) for the duration of the COVID-19 declaration under Section 564(b)(1) of the Act, 21 U.S.C.section 360bbb-3(b)(1), unless the authorization is terminated  or revoked sooner.       Influenza A by PCR NEGATIVE NEGATIVE Final    Influenza B by PCR NEGATIVE NEGATIVE Final    Comment: (NOTE) The Xpert Xpress SARS-CoV-2/FLU/RSV plus assay is intended as an aid in the diagnosis of influenza from Nasopharyngeal swab specimens and should not be used as a sole basis for treatment. Nasal washings and aspirates are unacceptable for Xpert Xpress SARS-CoV-2/FLU/RSV testing.  Fact Sheet for Patients: EntrepreneurPulse.com.au  Fact Sheet for Healthcare Providers: IncredibleEmployment.be  This test is not yet approved or cleared by the [place] FDA and has been authorized for detection and/or diagnosis of SARS-CoV-2 by FDA under an Emergency Use Authorization (EUA). This EUA will remain in effect (meaning this test can be used) for the duration of the COVID-19 declaration under Section 564(b)(1) of the Act, 21 U.S.C. section 360bbb-3(b)(1), unless the authorization is terminated or revoked.  Performed at [facility], [place] [address], [place], [place] [address]           Radiology Studies: DG Chest 1 View  Result Date: 03/08/2021 CLINICAL DATA:  Hypoxia EXAM: CHEST  1 VIEW COMPARISON:  Chest radiograph 03/05/2021 FINDINGS: A right chest wall port is in stable position. Median sternotomy wires are stable. The cardiomediastinal silhouette is stable. There is no focal consolidation or pulmonary edema. There is no new or worsening airspace disease. There is no pleural effusion or pneumothorax. The bones are stable. IMPRESSION: Stable chest with no radiographic evidence of acute cardiopulmonary process. Electronically Signed   By: Valetta Mole M.D.   On: 03/08/2021 16:45   ECHOCARDIOGRAM COMPLETE  Result Date: 03/07/2021    ECHOCARDIOGRAM REPORT   Patient Name:   Nicholas Daniels [facility] Date of Exam: 03/07/2021 Medical Rec #:  [id number]       Height:       67.0 in Accession #:    [id number]      Weight:       198.2 lb Date of Birth:  03/07/1951      BSA:          2.014 m  Patient Age:    35 years        BP:           111/73 mmHg Patient Gender: M               HR:           113 bpm. Exam Location:  Inpatient Procedure: 2D Echo Indications:    atrial fibrillation  History:        Patient has no prior history of Echocardiogram examinations.  Risk Factors:Hypertension.  Sonographer:    [place] Referring Phys: [id number] [place]  1. Left ventricular ejection fraction, by estimation, is 55 to 60%. The left ventricle has normal function. The left ventricle has no regional wall motion abnormalities. Left ventricular diastolic function could not be evaluated.  2. Right ventricular systolic function is normal. The right ventricular size is normal. There is moderately elevated pulmonary artery systolic pressure.  3. The mitral valve is normal in structure. Mild mitral valve regurgitation. No evidence of mitral stenosis.  4. The aortic valve is tricuspid. Aortic valve regurgitation is not visualized. No aortic stenosis is present.  5. The inferior vena cava is dilated in size with <50% respiratory variability, suggesting right atrial pressure of 15 mmHg. Comparison(s): No prior Echocardiogram. FINDINGS  Left Ventricle: Left ventricular ejection fraction, by estimation, is 55 to 60%. The left ventricle has normal function. The left ventricle has no regional wall motion abnormalities. The left ventricular internal cavity size was normal in size. There is  no left ventricular hypertrophy. Left ventricular diastolic function could not be evaluated due to atrial fibrillation. Left ventricular diastolic function could not be evaluated. Right Ventricle: The right ventricular size is normal. Right ventricular systolic function is normal. There is moderately elevated pulmonary artery systolic pressure. The tricuspid regurgitant velocity is 3.16 m/s, and with an assumed right atrial pressure of 15 mmHg, the estimated right ventricular systolic pressure is 53.6  mmHg. Left Atrium: Left atrial size was normal in size. Right Atrium: Right atrial size was normal in size. Pericardium: There is no evidence of pericardial effusion. Mitral Valve: The mitral valve is normal in structure. Mild mitral valve regurgitation. No evidence of mitral valve stenosis. Tricuspid Valve: The tricuspid valve is normal in structure. Tricuspid valve regurgitation is trivial. No evidence of tricuspid stenosis. Aortic Valve: The aortic valve is tricuspid. Aortic valve regurgitation is not visualized. No aortic stenosis is present. Pulmonic Valve: The pulmonic valve was normal in structure. Pulmonic valve regurgitation is trivial. No evidence of pulmonic stenosis. Aorta: The aortic root is normal in size and structure. Venous: The inferior vena cava is dilated in size with less than 50% respiratory variability, suggesting right atrial pressure of 15 mmHg. IAS/Shunts: No atrial level shunt detected by color flow Doppler.  LEFT VENTRICLE PLAX 2D LVIDd:         4.10 cm LVIDs:         2.60 cm LV PW:         1.10 cm LV IVS:        0.90 cm LVOT diam:     1.90 cm LVOT Area:     2.84 cm  IVC IVC diam: 2.50 cm LEFT ATRIUM           Index        RIGHT ATRIUM           Index LA diam:      3.70 cm 1.84 cm/m   RA Area:     14.90 cm LA Vol (A2C): 57.4 ml 28.49 ml/m  RA Volume:   36.10 ml  17.92 ml/m   AORTA Ao Root diam: 3.10 cm Ao Asc diam:  2.90 cm TRICUSPID VALVE TR Peak grad:   39.9 mmHg TR Vmax:        316.00 cm/s  SHUNTS Systemic Diam: 1.90 cm Kirk Ruths MD Electronically signed by Kirk Ruths MD Signature Date/Time: 03/07/2021/4:45:29 PM    Final  Scheduled Meds:  chlorhexidine  15 mL Mouth/Throat QID   Chlorhexidine Gluconate Cloth  6 each Topical Daily   feeding supplement (OSMOLITE 1.5 CAL)  474 mL Per Tube TID   feeding supplement (PROSource TF)  45 mL Per Tube BID   free water  180 mL Per Tube TID   latanoprost  1 drop Both Eyes QHS   magic mouthwash w/lidocaine  5 mL Oral  TID   metoprolol tartrate  2.5 mg Intravenous Q6H   pantoprazole sodium  40 mg Per Tube Q1200   sodium chloride flush  10-40 mL Intracatheter Q12H   timolol  1 drop Both Eyes Daily   Continuous Infusions:  diltiazem (CARDIZEM) infusion 15 mg/hr (03/09/21 1110)   heparin 1,150 Units/hr (03/08/21 1719)     LOS: 4 days   Georgette Shell, MD 03/09/2021, 1:53 PM

## 2021-03-09 NOTE — Progress Notes (Signed)
Progress Note  Patient Name: Nicholas Daniels Date of Encounter: 03/09/2021  Primary Cardiologist: Buford Dresser, MD  Subjective   No CP  No SOB  Some congestion  Inpatient Medications    Scheduled Meds:  chlorhexidine  15 mL Mouth/Throat QID   Chlorhexidine Gluconate Cloth  6 each Topical Daily   feeding supplement (OSMOLITE 1.5 CAL)  474 mL Per Tube TID   feeding supplement (PROSource TF)  45 mL Per Tube BID   free water  180 mL Per Tube TID   latanoprost  1 drop Both Eyes QHS   magic mouthwash w/lidocaine  5 mL Oral TID   pantoprazole sodium  40 mg Per Tube Q1200   sodium chloride flush  10-40 mL Intracatheter Q12H   timolol  1 drop Both Eyes Daily   Continuous Infusions:  diltiazem (CARDIZEM) infusion 15 mg/hr (03/09/21 0517)   heparin 1,150 Units/hr (03/08/21 1719)   PRN Meds: phenol, sodium chloride flush   Vital Signs    Vitals:   03/08/21 0625 03/08/21 1257 03/08/21 2100 03/09/21 0500  BP: 101/65 112/63 (!) 146/76   Pulse: 79 92 97   Resp:  18 18   Temp: 98.3 F (36.8 C) 98.6 F (37 C) 98.5 F (36.9 C)   TempSrc: Oral Oral Oral   SpO2: 98% 96% 100%   Weight:    90 kg  Height:        Intake/Output Summary (Last 24 hours) at 03/09/2021 0743 Last data filed at 03/09/2021 0000 Gross per 24 hour  Intake --  Output 903 ml  Net -903 ml   Last 3 Weights 03/09/2021 03/05/2021 03/05/2021  Weight (lbs) 198 lb 6.6 oz 198 lb 3.1 oz 198 lb 6.4 oz  Weight (kg) 90 kg 89.9 kg 89.994 kg     Telemetry    Atrial fib 80s to 100s  - Personally Reviewed  Physical Exam   GEN: No acute distress.  HEENT: Normocephalic, atraumatic, sclera non-icteric. Neck: No JVD or bruits. Cardiac: Irregularly irregular no murmurs,  Respiratory: Clear to auscultation bilaterally. Breathing is unlabored. GI: Soft, nontender, non-distended, BS +x 4. MS: no deformity. Extremities: No clubbing or cyanosis. No edema. Distal pedal pulses are 2+ and equal bilaterally. Neuro:   AAOx3. Follows commands. Psych:  Responds to questions appropriately with a normal affect.  Labs    High Sensitivity Troponin:   Recent Labs  Lab 03/05/21 1225 03/05/21 1425  TROPONINIHS 11 12      Cardiac EnzymesNo results for input(s): TROPONINI in the last 168 hours. No results for input(s): TROPIPOC in the last 168 hours.   Chemistry Recent Labs  Lab 03/07/21 0619 03/08/21 0344 03/09/21 0338  NA 135 135 134*  K 4.2 4.2 4.1  CL 103 102 100  CO2 27 29 29   GLUCOSE 107* 113* 115*  BUN 17 17 16   CREATININE 0.84 0.78 0.84  CALCIUM 8.2* 8.4* 8.4*  PROT 6.2* 6.1* 5.9*  ALBUMIN 3.1* 3.1* 3.2*  AST 14* 13* 15  ALT 25 21 22   ALKPHOS 27* 30* 29*  BILITOT 0.7 0.5 0.8  GFRNONAA >60 >60 >60  ANIONGAP 5 4* 5     Hematology Recent Labs  Lab 03/07/21 0619 03/08/21 0344 03/09/21 0338  WBC 1.0* 0.7* 0.8*  RBC 2.91* 2.88* 2.86*  HGB 9.2* 8.9* 8.9*  HCT 27.9* 27.3* 27.2*  MCV 95.9 94.8 95.1  MCH 31.6 30.9 31.1  MCHC 33.0 32.6 32.7  RDW 14.6 15.1 15.7*  PLT 123* 127* 141*  BNPNo results for input(s): BNP, PROBNP in the last 168 hours.   DDimer No results for input(s): DDIMER in the last 168 hours.   Radiology    DG Chest 1 View  Result Date: 03/08/2021 CLINICAL DATA:  Hypoxia EXAM: CHEST  1 VIEW COMPARISON:  Chest radiograph 03/05/2021 FINDINGS: A right chest wall port is in stable position. Median sternotomy wires are stable. The cardiomediastinal silhouette is stable. There is no focal consolidation or pulmonary edema. There is no new or worsening airspace disease. There is no pleural effusion or pneumothorax. The bones are stable. IMPRESSION: Stable chest with no radiographic evidence of acute cardiopulmonary process. Electronically Signed   By: Valetta Mole M.D.   On: 03/08/2021 16:45   ECHOCARDIOGRAM COMPLETE  Result Date: 03/07/2021    ECHOCARDIOGRAM REPORT   Patient Name:   Nicholas Daniels [facility] Date of Exam: 03/07/2021 Medical Rec #:  [id number]       Height:        67.0 in Accession #:    [id number]      Weight:       198.2 lb Date of Birth:  [birth year]      BSA:          2.014 m Patient Age:    70 years        BP:           111/73 mmHg Patient Gender: M               HR:           113 bpm. Exam Location:  Inpatient Procedure: 2D Echo Indications:    atrial fibrillation  History:        Patient has no prior history of Echocardiogram examinations.                 Risk Factors:Hypertension.  Sonographer:    [place] Referring Phys: [id number] [place]  1. Left ventricular ejection fraction, by estimation, is 55 to 60%. The left ventricle has normal function. The left ventricle has no regional wall motion abnormalities. Left ventricular diastolic function could not be evaluated.  2. Right ventricular systolic function is normal. The right ventricular size is normal. There is moderately elevated pulmonary artery systolic pressure.  3. The mitral valve is normal in structure. Mild mitral valve regurgitation. No evidence of mitral stenosis.  4. The aortic valve is tricuspid. Aortic valve regurgitation is not visualized. No aortic stenosis is present.  5. The inferior vena cava is dilated in size with <50% respiratory variability, suggesting right atrial pressure of 15 mmHg. Comparison(s): No prior Echocardiogram. FINDINGS  Left Ventricle: Left ventricular ejection fraction, by estimation, is 55 to 60%. The left ventricle has normal function. The left ventricle has no regional wall motion abnormalities. The left ventricular internal cavity size was normal in size. There is  no left ventricular hypertrophy. Left ventricular diastolic function could not be evaluated due to atrial fibrillation. Left ventricular diastolic function could not be evaluated. Right Ventricle: The right ventricular size is normal. Right ventricular systolic function is normal. There is moderately elevated pulmonary artery systolic pressure. The tricuspid regurgitant velocity is  3.16 m/s, and with an assumed right atrial pressure of 15 mmHg, the estimated right ventricular systolic pressure is 05.3 mmHg. Left Atrium: Left atrial size was normal in size. Right Atrium: Right atrial size was normal in size. Pericardium: There is no evidence of pericardial effusion. Mitral Valve: The mitral valve is normal in  structure. Mild mitral valve regurgitation. No evidence of mitral valve stenosis. Tricuspid Valve: The tricuspid valve is normal in structure. Tricuspid valve regurgitation is trivial. No evidence of tricuspid stenosis. Aortic Valve: The aortic valve is tricuspid. Aortic valve regurgitation is not visualized. No aortic stenosis is present. Pulmonic Valve: The pulmonic valve was normal in structure. Pulmonic valve regurgitation is trivial. No evidence of pulmonic stenosis. Aorta: The aortic root is normal in size and structure. Venous: The inferior vena cava is dilated in size with less than 50% respiratory variability, suggesting right atrial pressure of 15 mmHg. IAS/Shunts: No atrial level shunt detected by color flow Doppler.  LEFT VENTRICLE PLAX 2D LVIDd:         4.10 cm LVIDs:         2.60 cm LV PW:         1.10 cm LV IVS:        0.90 cm LVOT diam:     1.90 cm LVOT Area:     2.84 cm  IVC IVC diam: 2.50 cm LEFT ATRIUM           Index        RIGHT ATRIUM           Index LA diam:      3.70 cm 1.84 cm/m   RA Area:     14.90 cm LA Vol (A2C): 57.4 ml 28.49 ml/m  RA Volume:   36.10 ml  17.92 ml/m   AORTA Ao Root diam: 3.10 cm Ao Asc diam:  2.90 cm TRICUSPID VALVE TR Peak grad:   39.9 mmHg TR Vmax:        316.00 cm/s  SHUNTS Systemic Diam: 1.90 cm Kirk Ruths MD Electronically signed by Kirk Ruths MD Signature Date/Time: 03/07/2021/4:45:29 PM    Final     Cardiac Studies   2D echo 03/07/21   1. Left ventricular ejection fraction, by estimation, is 55 to 60%. The  left ventricle has normal function. The left ventricle has no regional  wall motion abnormalities. Left  ventricular diastolic function could not  be evaluated.   2. Right ventricular systolic function is normal. The right ventricular  size is normal. There is moderately elevated pulmonary artery systolic  pressure.   3. The mitral valve is normal in structure. Mild mitral valve  regurgitation. No evidence of mitral stenosis.   4. The aortic valve is tricuspid. Aortic valve regurgitation is not  visualized. No aortic stenosis is present.   5. The inferior vena cava is dilated in size with <50% respiratory  variability, suggesting right atrial pressure of 15 mmHg.   Comparison(s): No prior Echocardiogram.   Patient Profile     70 y.o. 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 Admitted with dysphagia/mouth pain/soreness, vomiting, weakness for several weeks, found to have pancytopenia, AKI with elevated BUN, and new onset atrial fib/flutter.    Assessment & Plan    1. New onset atrial fibrillation/flutter - initial EKG suspicious for atrial flutter, subsequent tracings c/w atrial fib - started on IV diltiazem and IV heparin  - TEE/DCCV deferred at this time due to pancytopenia/mouth soreness with consideration of DCCV alone as outpatient after 3 weeks of rate control/anticoagulation (less risk of trauma if TEE not required) -TSH wnl  Currently on IV diltiazem and heparin  HR are OK though could be better   Not taking PO well  Can aldd low dose metoprolol 2.5 q 6 hours   Follow   2  Mod pulmonary HTN   Consider outpt sleep eval for OSA   3. New RBBB - noted, no evidence of bradycardia   3. Pancytopenia in the context of chemotherapy for SCC (new from 02/18/21), also here with AKI, dysphagia - per primary team   4. Coronary calcification seen on CT 2016 - hold ASA given eventual  need for [facility] - consider statin, can defer to OP setting when patient clinically improved - troponins negative and no recent angina   5. Essential HTN - manage in context above - only on diltiazem as OP   6. Remotely diagnosed PFO by coronary CT 2016 - no hx of stroke  For questions or updates, please contact [place] Please consult [email or web address] for contact info under Cardiology/STEMI.  Signed, Dorris Carnes, MD 03/09/2021, 7:43 AM

## 2021-03-09 NOTE — Progress Notes (Signed)
[place] for heparin Indication: atrial fibrillation  No Known Allergies  Patient Measurements: Height: 5\' 7"  (170.2 cm) Weight: 90 kg (198 lb 6.6 oz) IBW/kg (Calculated) : 66.1 Heparin Dosing Weight: 90kg  Vital Signs:    Labs: Recent Labs    03/07/21 0619 03/08/21 0344 03/08/21 1049 03/09/21 0338  HGB 9.2* 8.9*  --  8.9*  HCT 27.9* 27.3*  --  27.2*  PLT 123* 127*  --  141*  HEPARINUNFRC 0.50 0.29* 0.39 0.37  CREATININE 0.84 0.78  --  0.84     Estimated Creatinine Clearance: 86.4 mL/min (by C-G formula based on SCr of 0.84 mg/dL).  Assessment: 70 yo male admitted with elevated HR, found to have new onset atrial fibrillation.  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  No anticoagulation noted PTA.  Pharmacy consulted to dose heparin per recommendations from [facility] and oncology. Per MD, target lower end of therapeutic goal given thrombocytopenia 2/2 chemotherapy.  03/09/2021 Heparin level continues to be therapeutic on IV heparin rate of 1150 units/hr No reported bleeding Hg stable, PLT 141 improving  Goal of Therapy:  Heparin level 0.3-0.5 units/ml per MD request due to above Monitor platelets by anticoagulation protocol: Yes   Plan:  Continue IV heparin at current rate of 1150 units/hr Monitor daily CBC, daily HL Follow-up ability to transition to oral/per tube anticoagulation - still not taking PO well per notes   Adrian Saran, PharmD, BCPS Secure Chat if ?s 03/09/2021 10:31 AM

## 2021-03-10 LAB — CBC WITH DIFFERENTIAL/PLATELET
Abs Immature Granulocytes: 0.01 10*3/uL (ref 0.00–0.07)
Basophils Absolute: 0 10*3/uL (ref 0.0–0.1)
Basophils Relative: 0 %
Eosinophils Absolute: 0 10*3/uL (ref 0.0–0.5)
Eosinophils Relative: 0 %
HCT: 29.2 % — ABNORMAL LOW (ref 39.0–52.0)
Hemoglobin: 9.6 g/dL — ABNORMAL LOW (ref 13.0–17.0)
Immature Granulocytes: 1 %
Lymphocytes Relative: 19 %
Lymphs Abs: 0.2 10*3/uL — ABNORMAL LOW (ref 0.7–4.0)
MCH: 31.3 pg (ref 26.0–34.0)
MCHC: 32.9 g/dL (ref 30.0–36.0)
MCV: 95.1 fL (ref 80.0–100.0)
Monocytes Absolute: 0.3 10*3/uL (ref 0.1–1.0)
Monocytes Relative: 30 %
Neutro Abs: 0.5 10*3/uL — ABNORMAL LOW (ref 1.7–7.7)
Neutrophils Relative %: 50 %
Platelets: 189 10*3/uL (ref 150–400)
RBC: 3.07 MIL/uL — ABNORMAL LOW (ref 4.22–5.81)
RDW: 15.9 % — ABNORMAL HIGH (ref 11.5–15.5)
WBC: 1.1 10*3/uL — CL (ref 4.0–10.5)
nRBC: 2.8 % — ABNORMAL HIGH (ref 0.0–0.2)

## 2021-03-10 LAB — COMPREHENSIVE METABOLIC PANEL
ALT: 24 U/L (ref 0–44)
AST: 18 U/L (ref 15–41)
Albumin: 3.3 g/dL — ABNORMAL LOW (ref 3.5–5.0)
Alkaline Phosphatase: 33 U/L — ABNORMAL LOW (ref 38–126)
Anion gap: 7 (ref 5–15)
BUN: 23 mg/dL (ref 8–23)
CO2: 30 mmol/L (ref 22–32)
Calcium: 8.9 mg/dL (ref 8.9–10.3)
Chloride: 97 mmol/L — ABNORMAL LOW (ref 98–111)
Creatinine, Ser: 0.75 mg/dL (ref 0.61–1.24)
GFR, Estimated: >60 mL/min (ref >60)
Glucose, Bld: 121 mg/dL — ABNORMAL HIGH (ref 70–99)
Potassium: 4.5 mmol/L (ref 3.5–5.1)
Sodium: 134 mmol/L — ABNORMAL LOW (ref 135–145)
Total Bilirubin: 0.6 mg/dL (ref 0.3–1.2)
Total Protein: 6.5 g/dL (ref 6.5–8.1)

## 2021-03-10 LAB — GLUCOSE, CAPILLARY
Glucose-Capillary: 145 mg/dL — ABNORMAL HIGH (ref 70–99)
Glucose-Capillary: 151 mg/dL — ABNORMAL HIGH (ref 70–99)
Glucose-Capillary: 140 mg/dL — ABNORMAL HIGH (ref 70–99)

## 2021-03-10 LAB — HEPARIN LEVEL (UNFRACTIONATED): Heparin Unfractionated: 0.37 IU/mL (ref 0.30–0.70)

## 2021-03-10 MED ORDER — DILTIAZEM HCL 60 MG PO TABS
60.0000 mg | ORAL_TABLET | Freq: Four times a day (QID) | ORAL | Status: DC
Start: 2021-03-10 — End: 2021-03-12
  Administered 2021-03-10 – 2021-03-12 (×9): 60 mg
  Filled 2021-03-10 (×9): qty 1

## 2021-03-10 MED ORDER — ONDANSETRON HCL 4 MG/2ML IJ SOLN
4.0000 mg | Freq: Three times a day (TID) | INTRAMUSCULAR | Status: DC | PRN
  Administered 2021-03-10: 02:00:00 4 mg via INTRAVENOUS
  Filled 2021-03-10: qty 2

## 2021-03-10 MED ORDER — DM-GUAIFENESIN ER 30-600 MG PO TB12
1.0000 | ORAL_TABLET | Freq: Two times a day (BID) | ORAL | Status: DC | PRN
  Administered 2021-03-10 (×2): 1 via ORAL
  Filled 2021-03-10 (×3): qty 1

## 2021-03-10 MED ORDER — METOPROLOL TARTRATE 25 MG PO TABS
25.0000 mg | ORAL_TABLET | Freq: Two times a day (BID) | ORAL | Status: DC
  Administered 2021-03-10 – 2021-03-12 (×5): 25 mg
  Filled 2021-03-10 (×5): qty 1

## 2021-03-10 MED ORDER — SUCRALFATE 1 GM/10ML PO SUSP
1.0000 g | Freq: Two times a day (BID) | ORAL | Status: DC
  Administered 2021-03-10 – 2021-03-12 (×5): 1 g via ORAL
  Filled 2021-03-10 (×5): qty 10

## 2021-03-10 NOTE — Progress Notes (Signed)
ANTICOAGULATION CONSULT NOTE   Pharmacy Consult for heparin Indication: atrial fibrillation  No Known Allergies  Patient Measurements: Height: 5\' 7"  (170.2 cm) Weight: 90 kg (198 lb 6.6 oz) IBW/kg (Calculated) : 66.1 Heparin Dosing Weight: 90kg  Vital Signs: Temp: 98.9 F (37.2 C) (12/03 2048) Temp Source: Oral (12/03 2048) BP: 113/68 (12/04 0100) Pulse Rate: 55 (12/03 2048)  Labs: Recent Labs    03/08/21 0344 03/08/21 1049 03/09/21 0338 03/10/21 0128  HGB 8.9*  --  8.9* 9.6*  HCT 27.3*  --  27.2* 29.2*  PLT 127*  --  141* 189  HEPARINUNFRC 0.29* 0.39 0.37 0.37  CREATININE 0.78  --  0.84 0.75     Estimated Creatinine Clearance: 90.7 mL/min (by C-G formula based on SCr of 0.75 mg/dL).  Assessment: 70 yo male admitted with elevated HR, found to have new onset atrial fibrillation.  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  No anticoagulation noted PTA.  Pharmacy consulted to dose heparin per recommendations from [facility] and oncology. Per MD, target lower end of therapeutic goal given thrombocytopenia 2/2 chemotherapy.  03/10/2021 Heparin level continues to be therapeutic on IV heparin rate of 1150 units/hr No reported bleeding Hg stable, PLT now WNL  Goal of Therapy:  Heparin level 0.3-0.5 units/ml per MD request due to above Monitor platelets by anticoagulation protocol: Yes   Plan:  Continue IV heparin at current rate of 1150 units/hr Monitor daily CBC, daily HL Follow-up ability to transition to oral/per tube anticoagulation - still not taking PO well per notes   Dolly Rias RPh 03/10/2021, 3:04 AM

## 2021-03-10 NOTE — Evaluation (Signed)
Clinical/Bedside Swallow Evaluation Patient Details  Name: Nicholas Daniels MRN: [id number] Date of Birth: 1950-10-29  Today's Date: 03/10/2021 Time: SLP Start Time (ACUTE ONLY): 74 SLP Stop Time (ACUTE ONLY): 4970 SLP Time Calculation (min) (ACUTE ONLY): 6 min  Past Medical History:  Past Medical History:  Diagnosis Date   Cataract    removed bilateraly   Coronary artery calcification seen on CT scan    Cough    Essential hypertension    Glaucoma    Head and neck cancer ([place])    Hypertension    PFO (patent foramen ovale)    PFO noted on coronary CT 09/05/14   Prostate cancer ([place]) 2009   Past Surgical History:  Past Surgical History:  Procedure Laterality Date   CATARACT EXTRACTION     bilateral and a wrinkle on retina on the left    CHOLECYSTECTOMY     COLONOSCOPY     DIRECT LARYNGOSCOPY N/A 12/17/2020   Procedure: DIRECT LARYNGOSCOPY WITH BIOPSIES;  Surgeon: Izora Gala, MD;  Location: [place];  Service: ENT;  Laterality: N/A;   ESOPHAGOSCOPY N/A 12/17/2020   Procedure: ESOPHAGOSCOPY;  Surgeon: Izora Gala, MD;  Location: [place];  Service: ENT;  Laterality: N/A;   IR GASTROSTOMY TUBE MOD SED  01/28/2021   IR IMAGING GUIDED PORT INSERTION  01/28/2021   PROSTATE SURGERY  2009   radiation treatment     RADICAL NECK DISSECTION Left 12/17/2020   Procedure: LEFT NECK DISSECTION;  Surgeon: Izora Gala, MD;  Location: [place];  Service: ENT;  Laterality: Left;   RESECTION OF MEDIASTINAL MASS N/A 02/26/2015   Procedure: RESECTION OF MEDIASTINAL MASS;  Surgeon: Ivin Poot, MD;  Location: [place];  Service: Thoracic;  Laterality: N/A;   STERNOTOMY N/A 02/26/2015   Procedure: STERNOTOMY;  Surgeon: Ivin Poot, MD;  Location: [facility] OR;  Service: Thoracic;  Laterality: N/A;   UPPER GASTROINTESTINAL ENDOSCOPY     HPI:  Nicholas Daniels is a 70 y.o. 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 Presenting with tachycardia. Pt had PEG tube placed  01/28/21. He relies on PEG for nutrition, but is still taking some liquids. Pt seen by OP SLP prior to initiation of treatment.  Pt reports he continues with HEP for swallowing.    Assessment / Plan / Recommendation  Clinical Impression  Pt presents with functional swallowing as assessed with thin liquids.  On OME, volitional swallow was effortful.  Pt exhibited no clinical s/s of aspiration with thin liquid. CXR 12/2 with no acute findings. Pt reports that he continues to take some liquids through treatment, but has not attempted solids for some time 2/2 dysgeusia.  Pt decline puree and solid trials today.  Pt was seen by OP SLP prior to initiation of therapy and continues with swallowing HEP.  He verbalized importance of continuing to take some POs through treatment.  Pt denied odynophagia, but did report general sore throat.  Given that pt is not interested in solid foods at this time and has nutritional support via PEG, recommend liquid diet to increase range of options of food that he feels he can swallow and perhaps he'll be able to find something that doesn't taste bad.    Recommend full liquid diet.  Pt has no further ST needs at this time, SLP will sign off.  SLP Visit Diagnosis: Dysphagia, unspecified (R13.10)    Aspiration Risk  No limitations    Diet Recommendation Thin liquid  Liquid Administration via: Cup;Straw Medication Administration:  (As tolerated, crush if necessary) Supervision: Patient able to self feed Compensations: Slow rate;Small sips/bites Postural Changes: Seated upright at 90 degrees    Other  Recommendations Oral Care Recommendations: Oral care QID    Recommendations for follow up therapy are one component of a multi-disciplinary discharge planning process, led by the attending physician.  Recommendations may be updated based on patient status, additional functional criteria and insurance authorization.  Follow up Recommendations No SLP follow up       Assistance Recommended at Discharge None  Functional Status Assessment Patient has not had a recent decline in their functional status  Frequency and Duration  (N/A)          Prognosis Prognosis for Safe Diet Advancement:  (N/A)      Swallow Study   General Date of Onset: 03/08/21 HPI: Nicholas Daniels is a 70 y.o. 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 Presenting with tachycardia. Pt had PEG tube placed 01/28/21. He relies on PEG for nutrition, but is still taking some liquids. Pt seen by OP SLP prior to initiation of treatment.  Pt reports he continues with HEP for swallowing. Type of Study: Bedside Swallow Evaluation Previous Swallow Assessment: Clinical assessment as outpatient Diet Prior to this Study: Dysphagia 3 (soft);Thin liquids Temperature Spikes Noted: No Respiratory Status: Room air History of Recent Intubation: No Behavior/Cognition: Alert;Cooperative;Pleasant mood Oral Cavity Assessment: Within Functional Limits Oral Care Completed by SLP: No Oral Cavity - Dentition: Adequate natural dentition Vision: Functional for self-feeding Self-Feeding Abilities: Able to feed self Patient Positioning: Upright in chair Baseline Vocal Quality: Normal Volitional Cough: Strong Volitional Swallow: Able to elicit (effortful)    Oral/Motor/Sensory Function Overall Oral Motor/Sensory Function: Within functional limits Facial ROM: Reduced left (Very slight) Facial Symmetry: Within Functional Limits Lingual ROM: Within Functional Limits Lingual Symmetry: Within Functional Limits Lingual Strength: Within Functional Limits Velum: Within Functional Limits Mandible: Within Functional Limits   Ice Chips Ice chips: Not tested   Thin Liquid Thin Liquid: Within functional limits Presentation: Cup    Nectar Thick Nectar Thick Liquid: Not tested   Honey Thick Honey Thick Liquid: Not tested   Puree Puree: Not tested   Solid     Solid: Not  tested      Celedonio Savage, [place], [place] Office: [phone] 03/10/2021,3:25 PM

## 2021-03-10 NOTE — Progress Notes (Signed)
PROGRESS NOTE    Nicholas Daniels  GYJ:[id number] DOB: [birth year] DOA: 03/05/2021 PCP: Leanna Battles, MD   Brief Narrative: Nicholas Daniels is a 70 y.o. 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 Presenting with tachycardia. He went to his onco appointment yesterday for radiation and chemo. When they set him up for chemo, the staff noticed that his HR was high. They gave him a fluid bolus and his HR lowered. They sent him home without incident. He returned today for radiation and chemo. When they set him up for chemo today, they noticed again that his HR was very high. They gave him another bolus, but his HR did not lower this time. They recommended that he come to the ED for help. He did not have any CP, dyspnea, or lightheadedness/dizziness during these episodes. He denies any other aggravating or alleviating factors.   ED Course: He was found to be tachycardic with HRs up to 160s. He was given fluid and OT cardizem IV. His rates slowed to 140s. EKG showed A fib RVR. He was placed on a cardizem gtt. TRH was called for admission.    Assessment & Plan:   Principal Problem:   Atrial fibrillation with RVR ([place])  #1 new onset afib likely precipitated by multifactorial reasons -dehydration and AKI and chemo-CHA2DS2-VASc score is 3  Change diltiazem to po currently on IV heparin. Echo -ejection fraction 55 to 60%.  No regional wall motion abnormalities.  Right ventricular size is normal.  No evidence of MS or AS. TSH NL Low-dose metoprolol started by cardiology   #2 htn stable 111/73 continue Cardizem   # 3 GERD/dysphagia secondary to radiation improving continue tube feeds and clear liquids as tolerated Chlorhexidine mouthwash Chloraseptic throat spray Magic mouthwash  #4 squamous cell carcinoma of the neck followed by Dr. Alen Blew  #5-pancytopenia/neutropenia chemo related-follow closely.  Platelets improving.  Hemoglobin improving.  Still  neutropenic.  #6 mouth sores chlorhexidine mouthwash  #7 AKI resolved with IV fluids  #8 constipation resolved with lactulose.   Estimated body mass index is 31.2 kg/m as calculated from the following:   Height as of this encounter: 5\' 7"  (1.702 m).   Weight as of this encounter: 90.4 kg.  DVT prophylaxis: Heparin  code Status: Full code  family Communication: None at bedside  disposition Plan:  Status is: Inpatient  Remains inpatient appropriate because: On IV heparin and IV Cardizem   Consultants:  Cardiology  Procedures: None Antimicrobials: None  Subjective:   Patient is resting in bed still not able to tolerate p.o. intake has sores in the mouth painful.  He reports that prior to admission he was mainly dependent on tube feeds and never ate anything by mouth much. Still on IV heparin and Cardizem  Objective: Vitals:   03/10/21 0400 03/10/21 0500 03/10/21 0600 03/10/21 0700  BP: (!) 141/87 134/74 115/78 118/77  Pulse:  92    Resp: 14 16 20 18   Temp:  99.1 F (37.3 C)    TempSrc:  Oral    SpO2:  98%    Weight:  90.4 kg    Height:       No intake or output data in the 24 hours ending 03/10/21 1149  Filed Weights   03/05/21 2209 03/09/21 0500 03/10/21 0500  Weight: 89.9 kg 90 kg 90.4 kg    Examination: Port right upper chest General exam: Appears calm and comfortable  Respiratory system: Clear to auscultation. Respiratory effort normal.  Cardiovascular system: S1 & S2 heard, irregular irregular no JVD, murmurs, rubs, gallops or clicks. No pedal edema. Gastrointestinal system: Abdomen is nondistended, soft and nontender. No organomegaly or masses felt. Normal bowel sounds heard.peg in place. Central nervous system: Alert and oriented. No focal neurological deficits. Extremities: Trace edema Skin: No rashes, lesions or ulcers Psychiatry: Judgement and insight appear normal. Mood & affect appropriate.     Data Reviewed: I have personally reviewed  following labs and imaging studies  CBC: Recent Labs  Lab 03/05/21 1225 03/06/21 0412 03/07/21 0619 03/08/21 0344 03/09/21 0338 03/10/21 0128  WBC 0.8* 0.8* 1.0* 0.7* 0.8* 1.1*  NEUTROABS 0.6*  --  0.7* 0.4* 0.5* 0.5*  HGB 9.7* 9.1* 9.2* 8.9* 8.9* 9.6*  HCT 29.1* 27.6* 27.9* 27.3* 27.2* 29.2*  MCV 93.6 94.2 95.9 94.8 95.1 95.1  PLT 95* 108* 123* 127* 141* 409    Basic Metabolic Panel: Recent Labs  Lab 03/05/21 1225 03/06/21 0412 03/07/21 0619 03/08/21 0344 03/09/21 0338 03/10/21 0128  NA 137 134* 135 135 134* 134*  K 4.4 4.2 4.2 4.2 4.1 4.5  CL 105 104 103 102 100 97*  CO2 27 24 27 29 29 30   GLUCOSE 141* 89 107* 113* 115* 121*  BUN 23 17 17 17 16 23   CREATININE 0.78 0.88 0.84 0.78 0.84 0.75  CALCIUM 8.1* 8.0* 8.2* 8.4* 8.4* 8.9  MG 1.9  --   --   --   --   --     GFR: Estimated Creatinine Clearance: 90.8 mL/min (by C-G formula based on SCr of 0.75 mg/dL). Liver Function Tests: Recent Labs  Lab 03/06/21 0412 03/07/21 0619 03/08/21 0344 03/09/21 0338 03/10/21 0128  AST 17 14* 13* 15 18  ALT 28 25 21 22 24   ALKPHOS 26* 27* 30* 29* 33*  BILITOT 0.9 0.7 0.5 0.8 0.6  PROT 5.9* 6.2* 6.1* 5.9* 6.5  ALBUMIN 3.2* 3.1* 3.1* 3.2* 3.3*    No results for input(s): LIPASE, AMYLASE in the last 168 hours. No results for input(s): AMMONIA in the last 168 hours. Coagulation Profile: No results for input(s): INR, PROTIME in the last 168 hours. Cardiac Enzymes: No results for input(s): CKTOTAL, CKMB, CKMBINDEX, TROPONINI in the last 168 hours. BNP (last 3 results) No results for input(s): PROBNP in the last 8760 hours. HbA1C: No results for input(s): HGBA1C in the last 72 hours. CBG: Recent Labs  Lab 03/09/21 0738 03/09/21 1145 03/09/21 1726 03/09/21 2047 03/09/21 2343  GLUCAP 104* 131* 130* 158* 153*    Lipid Profile: No results for input(s): CHOL, HDL, LDLCALC, TRIG, CHOLHDL, LDLDIRECT in the last 72 hours. Thyroid Function Tests: No results for input(s):  TSH, T4TOTAL, FREET4, T3FREE, THYROIDAB in the last 72 hours.  Anemia Panel: No results for input(s): VITAMINB12, FOLATE, FERRITIN, TIBC, IRON, RETICCTPCT in the last 72 hours. Sepsis Labs: No results for input(s): PROCALCITON, LATICACIDVEN in the last 168 hours.  Recent Results (from the past 240 hour(s))  Resp Panel by RT-PCR (Flu A&B, Covid) Nasopharyngeal Swab     Status: None   Collection Time: 03/05/21  3:23 PM   Specimen: Nasopharyngeal Swab; Nasopharyngeal(NP) swabs in vial transport medium  Result Value Ref Range Status   SARS Coronavirus 2 by RT PCR NEGATIVE NEGATIVE Final    Comment: (NOTE) SARS-CoV-2 target nucleic acids are NOT DETECTED.  The SARS-CoV-2 RNA is generally detectable in upper respiratory specimens during the acute phase of infection. The lowest concentration of SARS-CoV-2 viral copies this assay can detect is  138 copies/mL. A negative result does not preclude SARS-Cov-2 infection and should not be used as the sole basis for treatment or other patient management decisions. A negative result may occur with  improper specimen collection/handling, submission of specimen other than nasopharyngeal swab, presence of viral mutation(s) within the areas targeted by this assay, and inadequate number of viral copies(<138 copies/mL). A negative result must be combined with clinical observations, patient history, and epidemiological information. The expected result is Negative.  Fact Sheet for Patients:  EntrepreneurPulse.com.au  Fact Sheet for Healthcare Providers:  IncredibleEmployment.be  This test is no t yet approved or cleared by the [place] FDA and  has been authorized for detection and/or diagnosis of SARS-CoV-2 by FDA under an Emergency Use Authorization (EUA). This EUA will remain  in effect (meaning this test can be used) for the duration of the COVID-19 declaration under Section 564(b)(1) of the Act,  21 U.S.C.section 360bbb-3(b)(1), unless the authorization is terminated  or revoked sooner.       Influenza A by PCR NEGATIVE NEGATIVE Final   Influenza B by PCR NEGATIVE NEGATIVE Final    Comment: (NOTE) The Xpert Xpress SARS-CoV-2/FLU/RSV plus assay is intended as an aid in the diagnosis of influenza from Nasopharyngeal swab specimens and should not be used as a sole basis for treatment. Nasal washings and aspirates are unacceptable for Xpert Xpress SARS-CoV-2/FLU/RSV testing.  Fact Sheet for Patients: EntrepreneurPulse.com.au  Fact Sheet for Healthcare Providers: IncredibleEmployment.be  This test is not yet approved or cleared by the [place] FDA and has been authorized for detection and/or diagnosis of SARS-CoV-2 by FDA under an Emergency Use Authorization (EUA). This EUA will remain in effect (meaning this test can be used) for the duration of the COVID-19 declaration under Section 564(b)(1) of the Act, 21 U.S.C. section 360bbb-3(b)(1), unless the authorization is terminated or revoked.  Performed at [facility], [place], [place], [place] [address]           Radiology Studies: DG Chest 1 View  Result Date: 03/08/2021 CLINICAL DATA:  Hypoxia EXAM: CHEST  1 VIEW COMPARISON:  Chest radiograph 03/05/2021 FINDINGS: A right chest wall port is in stable position. Median sternotomy wires are stable. The cardiomediastinal silhouette is stable. There is no focal consolidation or pulmonary edema. There is no new or worsening airspace disease. There is no pleural effusion or pneumothorax. The bones are stable. IMPRESSION: Stable chest with no radiographic evidence of acute cardiopulmonary process. Electronically Signed   By: Valetta Mole M.D.   On: 03/08/2021 16:45        Scheduled Meds:  chlorhexidine  15 mL Mouth/Throat QID   Chlorhexidine Gluconate Cloth  6 each Topical Daily   diltiazem  60 mg Per Tube  Q6H   feeding supplement (OSMOLITE 1.5 CAL)  474 mL Per Tube TID   feeding supplement (PROSource TF)  45 mL Per Tube BID   free water  180 mL Per Tube TID   latanoprost  1 drop Both Eyes QHS   magic mouthwash w/lidocaine  5 mL Oral TID   metoprolol tartrate  25 mg Per Tube BID   pantoprazole sodium  40 mg Per Tube Q1200   sodium chloride flush  10-40 mL Intracatheter Q12H   timolol  1 drop Both Eyes Daily   Continuous Infusions:  heparin 1,150 Units/hr (03/09/21 1516)     LOS: 5 days   Georgette Shell, MD 03/10/2021, 11:49 AM

## 2021-03-10 NOTE — Plan of Care (Signed)
  Problem: Activity: Goal: Risk for activity intolerance will decrease Outcome: Progressing   Problem: Nutrition: Goal: Adequate nutrition will be maintained Outcome: Progressing   Problem: Pain Managment: Goal: General experience of comfort will improve Outcome: Progressing   Problem: Safety: Goal: Ability to remain free from injury will improve Outcome: Progressing   

## 2021-03-10 NOTE — Progress Notes (Addendum)
Progress Note  Patient Name: Nicholas Daniels Date of Encounter: 03/10/2021  Primary Cardiologist: Buford Dresser, MD  Subjective   Patient sleeping soundly    Inpatient Medications    Scheduled Meds:  chlorhexidine  15 mL Mouth/Throat QID   Chlorhexidine Gluconate Cloth  6 each Topical Daily   feeding supplement (OSMOLITE 1.5 CAL)  474 mL Per Tube TID   feeding supplement (PROSource TF)  45 mL Per Tube BID   free water  180 mL Per Tube TID   latanoprost  1 drop Both Eyes QHS   magic mouthwash w/lidocaine  5 mL Oral TID   metoprolol tartrate  2.5 mg Intravenous Q6H   pantoprazole sodium  40 mg Per Tube Q1200   sodium chloride flush  10-40 mL Intracatheter Q12H   timolol  1 drop Both Eyes Daily   Continuous Infusions:  diltiazem (CARDIZEM) infusion 15 mg/hr (03/10/21 0539)   heparin 1,150 Units/hr (03/09/21 1516)   PRN Meds: dextromethorphan-guaiFENesin, ondansetron (ZOFRAN) IV, phenol, sodium chloride flush   Vital Signs    Vitals:   03/10/21 0400 03/10/21 0500 03/10/21 0600 03/10/21 0700  BP: (!) 141/87 134/74 115/78 118/77  Pulse:  92    Resp: 14 16 20 18   Temp:  99.1 F (37.3 C)    TempSrc:  Oral    SpO2:  98%    Weight:  90.4 kg    Height:        Intake/Output Summary (Last 24 hours) at 03/10/2021 0733 Last data filed at 03/09/2021 1100 Gross per 24 hour  Intake --  Output 900 ml  Net -900 ml   Last 3 Weights 03/10/2021 03/09/2021 03/05/2021  Weight (lbs) 199 lb 3.2 oz 198 lb 6.6 oz 198 lb 3.1 oz  Weight (kg) 90.357 kg 90 kg 89.9 kg     Telemetry    Afib 60s to 70s  - Personally Reviewed  Physical Exam   Deferred as pt sleeping very comfortably    Labs    High Sensitivity Troponin:   Recent Labs  Lab 03/05/21 1225 03/05/21 1425  TROPONINIHS 11 12      Cardiac EnzymesNo results for input(s): TROPONINI in the last 168 hours. No results for input(s): TROPIPOC in the last 168 hours.   Chemistry Recent Labs  Lab 03/08/21 0344  03/09/21 0338 03/10/21 0128  NA 135 134* 134*  K 4.2 4.1 4.5  CL 102 100 97*  CO2 29 29 30   GLUCOSE 113* 115* 121*  BUN 17 16 23   CREATININE 0.78 0.84 0.75  CALCIUM 8.4* 8.4* 8.9  PROT 6.1* 5.9* 6.5  ALBUMIN 3.1* 3.2* 3.3*  AST 13* 15 18  ALT 21 22 24   ALKPHOS 30* 29* 33*  BILITOT 0.5 0.8 0.6  GFRNONAA >60 >60 >60  ANIONGAP 4* 5 7     Hematology Recent Labs  Lab 03/08/21 0344 03/09/21 0338 03/10/21 0128  WBC 0.7* 0.8* 1.1*  RBC 2.88* 2.86* 3.07*  HGB 8.9* 8.9* 9.6*  HCT 27.3* 27.2* 29.2*  MCV 94.8 95.1 95.1  MCH 30.9 31.1 31.3  MCHC 32.6 32.7 32.9  RDW 15.1 15.7* 15.9*  PLT 127* 141* 189    BNPNo results for input(s): BNP, PROBNP in the last 168 hours.   DDimer No results for input(s): DDIMER in the last 168 hours.   Radiology    DG Chest 1 View  Result Date: 03/08/2021 CLINICAL DATA:  Hypoxia EXAM: CHEST  1 VIEW COMPARISON:  Chest radiograph 03/05/2021 FINDINGS: A right  chest wall port is in stable position. Median sternotomy wires are stable. The cardiomediastinal silhouette is stable. There is no focal consolidation or pulmonary edema. There is no new or worsening airspace disease. There is no pleural effusion or pneumothorax. The bones are stable. IMPRESSION: Stable chest with no radiographic evidence of acute cardiopulmonary process. Electronically Signed   By: Valetta Mole M.D.   On: 03/08/2021 16:45    Cardiac Studies   2D echo 03/07/21   1. Left ventricular ejection fraction, by estimation, is 55 to 60%. The  left ventricle has normal function. The left ventricle has no regional  wall motion abnormalities. Left ventricular diastolic function could not  be evaluated.   2. Right ventricular systolic function is normal. The right ventricular  size is normal. There is moderately elevated pulmonary artery systolic  pressure.   3. The mitral valve is normal in structure. Mild mitral valve  regurgitation. No evidence of mitral stenosis.   4. The aortic  valve is tricuspid. Aortic valve regurgitation is not  visualized. No aortic stenosis is present.   5. The inferior vena cava is dilated in size with <50% respiratory  variability, suggesting right atrial pressure of 15 mmHg.   Comparison(s): No prior Echocardiogram.   Patient Profile     70 y.o. 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 Admitted with dysphagia/mouth pain/soreness, vomiting, weakness for several weeks, found to have pancytopenia, AKI with elevated BUN, and new onset atrial fib/flutter.    Assessment & Plan    1. New onset atrial fibrillation/flutter - Rates improved with addtion of metoprolol yesterday   Continue dilt Pt with PEG tube   Agree with transition to pills in PEG tub Will keep on heparin for now to make sure tolerating tube   He did have emesis yesterday      - TEE/DCCV deferred at this time due to pancytopenia/mouth soreness with consideration of DCCV alone as outpatient after 3 weeks of rate control/anticoagulation (less risk of trauma if TEE not required)   2  Mod pulmonary HTN   Consider outpt sleep eval for OSA   3. New RBBB - noted, no evidence of bradycardia   3. Pancytopenia in the context of chemotherapy for SCC (new from 02/18/21), also here with AKI, dysphagia - per primary team   4. Coronary calcification seen on CT 2016 No CP   - hold ASA given eventual need for [facility] - consider statin, can defer to OP setting when patient clinically improved    5. Essential HTN - BP controlled on current regimen    6. Remotely diagnosed PFO by coronary CT 2016 - no hx of stroke  For questions or updates, please contact [place] Please consult [email or web address] for contact info under Cardiology/STEMI.  Signed, Dorris Carnes, MD 03/10/2021, 7:33  AM

## 2021-03-10 NOTE — Evaluation (Signed)
Physical Therapy Evaluation Patient Details Name: Nicholas Daniels MRN: [id number] DOB: [birth year] Today's Date: 03/10/2021  History of Present Illness  70 yo male admitted with Afib with RVR. Hx of prostate ca, head/neck ca, thrombocytopenia 2* chemo  Clinical Impression  On eval, pt was Supv for mobility. He walked ~250 feet around the unit with support of IV pole. HR 86-103 bpm. Pt tolerated activity well. Will plan to follow during hospital stay. Do not anticipate any f/u PT needs after discharge.        Recommendations for follow up therapy are one component of a multi-disciplinary discharge planning process, led by the attending physician.  Recommendations may be updated based on patient status, additional functional criteria and insurance authorization.  Follow Up Recommendations No PT follow up    Assistance Recommended at Discharge Intermittent Supervision/Assistance  Functional Status Assessment Patient has had a recent decline in their functional status and demonstrates the ability to make significant improvements in function in a reasonable and predictable amount of time.  Equipment Recommendations  None recommended by PT    Recommendations for Other Services       Precautions / Restrictions Precautions Precaution Comments: monitor HR Restrictions Weight Bearing Restrictions: No      Mobility  Bed Mobility Overal bed mobility: Modified Independent                  Transfers Overall transfer level: Modified independent                      Ambulation/Gait Ambulation/Gait assistance: Supervision Gait Distance (Feet): 250 Feet Assistive device: IV Pole Gait Pattern/deviations: Step-through pattern;Decreased stride length       General Gait Details: HR fluctuated between 86 bpm-103 bpm. Pt tolerated distance well. No overt LOB. No dizziness reported.  Stairs            Wheelchair Mobility    Modified Rankin (Stroke Patients Only)        Balance                                             Pertinent Vitals/Pain Pain Assessment: No/denies pain Breathing: normal Negative Vocalization: none Facial Expression: smiling or inexpressive Body Language: relaxed Consolability: no need to console PAINAD Score: 0    Home Living Family/patient expects to be discharged to:: Private residence Living Arrangements: Spouse/significant other Available Help at Discharge: Family Type of Home: House         Home Layout: Bed/bath upstairs Home Equipment: None      Prior Function Prior Level of Function : Independent/Modified Independent                     Hand Dominance        Extremity/Trunk Assessment   Upper Extremity Assessment Upper Extremity Assessment: Overall WFL for tasks assessed    Lower Extremity Assessment Lower Extremity Assessment: Generalized weakness    Cervical / Trunk Assessment Cervical / Trunk Assessment: Normal  Communication   Communication: No difficulties  Cognition Arousal/Alertness: Awake/alert Behavior During Therapy: WFL for tasks assessed/performed Overall Cognitive Status: Within Functional Limits for tasks assessed                                   Functional Status Assessment: Patient  has not had a recent decline in their functional status      General Comments      Exercises     Assessment/Plan    PT Assessment Patient needs continued PT services  PT Problem List Decreased activity tolerance;Decreased mobility       PT Treatment Interventions Therapeutic activities;Gait training;Functional mobility training;Balance training;Patient/family education    PT Goals (Current goals can be found in the Care Plan section)  Acute Rehab PT Goals Patient Stated Goal: home soon. PT Goal Formulation: With patient Time For Goal Achievement: 03/24/21 Potential to Achieve Goals: Good    Frequency Min 2X/week   Barriers to  discharge        Co-evaluation               AM-PAC PT "6 Clicks" Mobility  Outcome Measure Help needed turning from your back to your side while in a flat bed without using bedrails?: None Help needed moving from lying on your back to sitting on the side of a flat bed without using bedrails?: None Help needed moving to and from a bed to a chair (including a wheelchair)?: None Help needed standing up from a chair using your arms (e.g., wheelchair or bedside chair)?: None Help needed to walk in hospital room?: A Little Help needed climbing 3-5 steps with a railing? : A Little 6 Click Score: 22    End of Session   Activity Tolerance: Patient tolerated treatment well Patient left: in bed;with call bell/phone within reach   PT Visit Diagnosis: Muscle weakness (generalized) (M62.81);Difficulty in walking, not elsewhere classified (R26.2)    Time: 0263-7858 PT Time Calculation (min) (ACUTE ONLY): 29 min   Charges:   PT Evaluation $PT Eval Low Complexity: 1 Low PT Treatments $Gait Training: 8-22 mins           Doreatha Massed, PT Acute Rehabilitation  Office: [phone] Pager: [phone]

## 2021-03-11 ENCOUNTER — Ambulatory Visit
Admission: RE | Admit: 2021-03-11 | Discharge: 2021-03-11 | Disposition: A | Discharge status: Home / Self Care | Payer: PPO | Source: Ambulatory Visit | Attending: Radiation Oncology | Admitting: Radiation Oncology

## 2021-03-11 ENCOUNTER — Ambulatory Visit: Payer: PPO

## 2021-03-11 ENCOUNTER — Encounter: Payer: PPO | Admitting: Nutrition

## 2021-03-11 ENCOUNTER — Ambulatory Visit: Payer: PPO | Admitting: Oncology

## 2021-03-11 ENCOUNTER — Other Ambulatory Visit: Payer: PPO

## 2021-03-11 ENCOUNTER — Encounter: Payer: Self-pay | Admitting: Oncology

## 2021-03-11 ENCOUNTER — Telehealth: Payer: Self-pay | Admitting: *Deleted

## 2021-03-11 ENCOUNTER — Other Ambulatory Visit (HOSPITAL_COMMUNITY): Payer: Self-pay

## 2021-03-11 ENCOUNTER — Other Ambulatory Visit: Payer: Self-pay | Admitting: Radiation Oncology

## 2021-03-11 LAB — CBC
HCT: 27.6 % — ABNORMAL LOW (ref 39.0–52.0)
Hemoglobin: 9.1 g/dL — ABNORMAL LOW (ref 13.0–17.0)
MCH: 31.7 pg (ref 26.0–34.0)
MCHC: 33 g/dL (ref 30.0–36.0)
MCV: 96.2 fL (ref 80.0–100.0)
Platelets: 213 10*3/uL (ref 150–400)
RBC: 2.87 MIL/uL — ABNORMAL LOW (ref 4.22–5.81)
RDW: 16.4 % — ABNORMAL HIGH (ref 11.5–15.5)
WBC: 1.2 10*3/uL — CL (ref 4.0–10.5)
nRBC: 3.3 % — ABNORMAL HIGH (ref 0.0–0.2)

## 2021-03-11 LAB — HEPARIN LEVEL (UNFRACTIONATED): Heparin Unfractionated: 0.33 IU/mL (ref 0.30–0.70)

## 2021-03-11 LAB — GLUCOSE, CAPILLARY
Glucose-Capillary: 101 mg/dL — ABNORMAL HIGH (ref 70–99)
Glucose-Capillary: 102 mg/dL — ABNORMAL HIGH (ref 70–99)
Glucose-Capillary: 105 mg/dL — ABNORMAL HIGH (ref 70–99)
Glucose-Capillary: 128 mg/dL — ABNORMAL HIGH (ref 70–99)
Glucose-Capillary: 90 mg/dL (ref 70–99)
Glucose-Capillary: 93 mg/dL (ref 70–99)

## 2021-03-11 MED ORDER — APIXABAN 5 MG PO TABS
5.0000 mg | ORAL_TABLET | Freq: Two times a day (BID) | ORAL | Status: DC
  Administered 2021-03-11 – 2021-03-12 (×3): 5 mg
  Filled 2021-03-11 (×5): qty 1

## 2021-03-11 MED ORDER — OSMOLITE 1.2 CAL PO LIQD
474.0000 mL | Freq: Three times a day (TID) | ORAL | Status: DC
  Administered 2021-03-11 – 2021-03-12 (×3): 474 mL
  Filled 2021-03-11 (×4): qty 474

## 2021-03-11 MED ORDER — PROSOURCE TF PO LIQD
45.0000 mL | Freq: Three times a day (TID) | ORAL | Status: DC
  Administered 2021-03-11 – 2021-03-12 (×4): 45 mL
  Filled 2021-03-11 (×5): qty 45

## 2021-03-11 MED ORDER — OSMOLITE 1.2 CAL PO LIQD
237.0000 mL | Freq: Three times a day (TID) | ORAL | Status: DC
Start: 2021-03-11 — End: 2021-03-11
  Administered 2021-03-11: 237 mL
  Filled 2021-03-11 (×2): qty 237

## 2021-03-11 NOTE — Telephone Encounter (Signed)
RETURNED PATIENT'S PHONE CALL TO RESCHEDULE FU FOR 03-29-21 PER PATIENT'S WIFE'S REQUEST, RESCHEDULED FOR 04-10-21 @ 11 AM, PATIENT'S WIFE (KAREN AGREED TO NEW DAY AND TIME

## 2021-03-11 NOTE — Progress Notes (Signed)
IP PROGRESS NOTE  Subjective:   [name] feels reasonably well without any complaints this morning.  He still has some occasional nausea and mouth pain.  He denies any chest pain or shortness of breath.  He denies any difficulty breathing.  Objective:  Vital signs in last 24 hours: Temp:  [98.4 F (36.9 C)-99 F (37.2 C)] 99 F (37.2 C) (12/05 0416) Pulse Rate:  [78-100] 100 (12/05 0416) Resp:  [14-31] 14 (12/05 0000) BP: (99-146)/(64-87) 138/83 (12/05 0500) SpO2:  [93 %-94 %] 94 % (12/05 0416) Weight:  [197 lb 4.8 oz (89.5 kg)] 197 lb 4.8 oz (89.5 kg) (12/05 0416) Weight change: -1 lb 14.4 oz (-0.862 kg) Last BM Date: 03/09/21  Intake/Output from previous day: 12/04 0701 - 12/05 0700 In: 3604.6 [P.O.:840; I.V.:1650.6; NG/GT:1114] Out: 2250 [Urine:2250] General: Alert, awake without distress. Head: Normocephalic atraumatic. Mouth: mucous membranes moist, pharynx normal without lesions Eyes: No scleral icterus.  Pupils are equal and round reactive to light. Resp: clear to auscultation bilaterally without rhonchi or wheezes or dullness to percussion. Cardio: Regular GI: soft, non-tender; bowel sounds normal; no masses,  no organomegaly Musculoskeletal: No joint deformity or effusion. Neurological: No motor, sensory deficits.  Intact deep tendon reflexes. Skin: No rashes or lesions.  Portacath without erythema  Lab Results: Recent Labs    03/10/21 0128 03/11/21 0340  WBC 1.1* 1.2*  HGB 9.6* 9.1*  HCT 29.2* 27.6*  PLT 189 213    BMET Recent Labs    03/09/21 0338 03/10/21 0128  NA 134* 134*  K 4.1 4.5  CL 100 97*  CO2 29 30  GLUCOSE 115* 121*  BUN 16 23  CREATININE 0.84 0.75  CALCIUM 8.4* 8.9    Studies/Results: No results found.  Medications: I have reviewed the patient's current medications.  Assessment/Plan:  70 year old with:  1. 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   He  is currently receiving adjuvant therapy with the last treatment scheduled for this week.  He will no longer receive any additional chemotherapy given his cytopenia as well as atrial fibrillation.  This was discussed today with the patient and he will proceed with active surveillance moving forward.      2.  Atrial fibrillation: Management per the primary team as well as cardiology.  He remains on IV heparin and p.o. diltiazem with rate control.  3.  Pancytopenia: Related to previous chemotherapy exposure absolute neutrophil count remains low although does not require any growth factor support at this time.  I anticipate spontaneous recovery of his neutrophils in the next few days.  4.  Acute kidney injury: His BUN and creatinine improved with hydration.  5.  Disposition and follow-up: We will arrange follow-up upon his discharge from oncology standpoint.   25  minutes were dedicated to this visit.  50% of the time was spent face-to-face and the time was dedicated to reviewing disease status, treatment choices and addressing complication related to cancer and cancer therapy.   LOS: 6 days   Zola Button 03/11/2021, 8:28 AM

## 2021-03-11 NOTE — Progress Notes (Signed)
ANTICOAGULATION CONSULT NOTE   Pharmacy Consult for heparin Indication: atrial fibrillation  No Known Allergies  Patient Measurements: Height: 5\' 7"  (170.2 cm) Weight: 89.5 kg (197 lb 4.8 oz) IBW/kg (Calculated) : 66.1 Heparin Dosing Weight: 90 kg  Vital Signs: Temp: 99 F (37.2 C) (12/05 0416) Temp Source: Oral (12/05 0416) BP: 138/83 (12/05 0500) Pulse Rate: 100 (12/05 0416)  Labs: Recent Labs    03/09/21 0338 03/10/21 0128 03/11/21 0340  HGB 8.9* 9.6* 9.1*  HCT 27.2* 29.2* 27.6*  PLT 141* 189 213  HEPARINUNFRC 0.37 0.37 0.33  CREATININE 0.84 0.75  --      Estimated Creatinine Clearance: 90.4 mL/min (by C-G formula based on SCr of 0.75 mg/dL).  Assessment: 70 yo male admitted with elevated HR, found to have new onset atrial fibrillation.  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  No anticoagulation noted PTA.  Pharmacy consulted to dose heparin per recommendations from [facility] and oncology. Per MD, target lower end of therapeutic goal given thrombocytopenia 2/2 chemotherapy.  03/11/2021 Heparin level continues to be therapeutic on IV heparin rate of 1150 units/hr No reported bleeding Hg stable, PLT now WNL  Goal of Therapy:  Heparin level 0.3-0.5 units/ml per MD request due to above Monitor platelets by anticoagulation protocol: Yes   Plan:  Continue IV heparin at current rate of 1150 units/hr Monitor daily CBC, daily HL Follow-up ability to transition to oral/per tube anticoagulation - still not taking PO well due to painful mouth sores per notes.    Royetta Asal, PharmD, BCPS Clinical Pharmacist [place] Please utilize Amion for appropriate phone number to reach the unit pharmacist ([place]) 03/11/2021 7:40 AM

## 2021-03-11 NOTE — Progress Notes (Signed)
Progress Note  Patient Name: Nicholas Daniels Date of Encounter: 03/11/2021  Primary Cardiologist: Buford Dresser, MD  Subjective   Feels okay -remains in afib with variable rates, sometimes tachycardic.  Inpatient Medications    Scheduled Meds:  apixaban  5 mg Per Tube BID   chlorhexidine  15 mL Mouth/Throat QID   Chlorhexidine Gluconate Cloth  6 each Topical Daily   diltiazem  60 mg Per Tube Q6H   feeding supplement (OSMOLITE 1.2 CAL)  237 mL Per Tube TID   feeding supplement (PROSource TF)  45 mL Per Tube TID   free water  180 mL Per Tube TID   latanoprost  1 drop Both Eyes QHS   magic mouthwash w/lidocaine  5 mL Oral TID   metoprolol tartrate  25 mg Per Tube BID   pantoprazole sodium  40 mg Per Tube Q1200   sodium chloride flush  10-40 mL Intracatheter Q12H   sucralfate  1 g Oral BID   timolol  1 drop Both Eyes Daily   Continuous Infusions:   PRN Meds: dextromethorphan-guaiFENesin, ondansetron (ZOFRAN) IV, phenol, sodium chloride flush   Vital Signs    Vitals:   03/10/21 2300 03/11/21 0000 03/11/21 0416 03/11/21 0500  BP: 137/69 129/73 136/87 138/83  Pulse:   100   Resp: 19 14    Temp:   99 F (37.2 C)   TempSrc:   Oral   SpO2:   94%   Weight:   89.5 kg   Height:        Intake/Output Summary (Last 24 hours) at 03/11/2021 1210 Last data filed at 03/11/2021 0400 Gross per 24 hour  Intake 2986.13 ml  Output 2250 ml  Net 736.13 ml   Last 3 Weights 03/11/2021 03/10/2021 03/09/2021  Weight (lbs) 197 lb 4.8 oz 199 lb 3.2 oz 198 lb 6.6 oz  Weight (kg) 89.495 kg 90.357 kg 90 kg     Telemetry    Afib 60s to 70s  - Personally Reviewed  Physical Exam   Deferred as pt sleeping very comfortably    Labs    High Sensitivity Troponin:   Recent Labs  Lab 03/05/21 1225 03/05/21 1425  TROPONINIHS 11 12      Cardiac EnzymesNo results for input(s): TROPONINI in the last 168 hours. No results for input(s): TROPIPOC in the last 168 hours.    Chemistry Recent Labs  Lab 03/08/21 0344 03/09/21 0338 03/10/21 0128  NA 135 134* 134*  K 4.2 4.1 4.5  CL 102 100 97*  CO2 29 29 30   GLUCOSE 113* 115* 121*  BUN 17 16 23   CREATININE 0.78 0.84 0.75  CALCIUM 8.4* 8.4* 8.9  PROT 6.1* 5.9* 6.5  ALBUMIN 3.1* 3.2* 3.3*  AST 13* 15 18  ALT 21 22 24   ALKPHOS 30* 29* 33*  BILITOT 0.5 0.8 0.6  GFRNONAA >60 >60 >60  ANIONGAP 4* 5 7     Hematology Recent Labs  Lab 03/09/21 0338 03/10/21 0128 03/11/21 0340  WBC 0.8* 1.1* 1.2*  RBC 2.86* 3.07* 2.87*  HGB 8.9* 9.6* 9.1*  HCT 27.2* 29.2* 27.6*  MCV 95.1 95.1 96.2  MCH 31.1 31.3 31.7  MCHC 32.7 32.9 33.0  RDW 15.7* 15.9* 16.4*  PLT 141* 189 213    BNPNo results for input(s): BNP, PROBNP in the last 168 hours.   DDimer No results for input(s): DDIMER in the last 168 hours.   Radiology    No results found.  Cardiac Studies  2D echo 03/07/21   1. Left ventricular ejection fraction, by estimation, is 55 to 60%. The  left ventricle has normal function. The left ventricle has no regional  wall motion abnormalities. Left ventricular diastolic function could not  be evaluated.   2. Right ventricular systolic function is normal. The right ventricular  size is normal. There is moderately elevated pulmonary artery systolic  pressure.   3. The mitral valve is normal in structure. Mild mitral valve  regurgitation. No evidence of mitral stenosis.   4. The aortic valve is tricuspid. Aortic valve regurgitation is not  visualized. No aortic stenosis is present.   5. The inferior vena cava is dilated in size with <50% respiratory  variability, suggesting right atrial pressure of 15 mmHg.   Comparison(s): No prior Echocardiogram.   Patient Profile     70 y.o. 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 Admitted with dysphagia/mouth pain/soreness, vomiting, weakness for several weeks, found to have pancytopenia, AKI with elevated BUN, and new onset atrial fib/flutter.    Assessment & Plan    1. New onset atrial fibrillation/flutter - Rates improved with addtion of metoprolol yesterday   Continue dilt 60 mg q6 hrs -switched to Eliquis per tube - recommend DCCV if he persists in afib after 3 weeks of anticoagulation  2  Mod pulmonary HTN   Consider outpt sleep eval for OSA   3. New RBBB - noted, no evidence of bradycardia   3. Pancytopenia in the context of chemotherapy for SCC (new from 02/18/21), also here with AKI, dysphagia - per primary team   4. Coronary calcification seen on CT 2016 No CP   - hold ASA given eventual need for [facility] - consider statin, can defer to OP setting when patient clinically improved    5. Essential HTN - BP controlled on current regimen    6. Remotely diagnosed PFO by coronary CT 2016 - no hx of stroke  For questions or updates, please contact [place] Please consult [email or web address] for contact info under Cardiology/STEMI.  Pixie Casino, MD, [facility], [place] Director of the [facility]  Card[facility] Diplomate of the American Board of Clinical Lipidology Attending Cardiologist  Direct Dial: [phone]  Fax: [phone]  Website:  [email or web address]  Pixie Casino, MD 03/11/2021, 12:10 PM

## 2021-03-11 NOTE — Progress Notes (Signed)
PROGRESS NOTE    Nicholas Daniels  QJJ:[id number] DOB: 1950-09-18 DOA: 03/05/2021 PCP: Nicholas Battles, MD   Brief Narrative: Nicholas Daniels is a 70 y.o. 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 Presenting with tachycardia. He went to his onco appointment yesterday for radiation and chemo. When they set him up for chemo, the staff noticed that his HR was high. They gave him a fluid bolus and his HR lowered. They sent him home without incident. He returned today for radiation and chemo. When they set him up for chemo today, they noticed again that his HR was very high. They gave him another bolus, but his HR did not lower this time. They recommended that he come to the ED for help. He did not have any CP, dyspnea, or lightheadedness/dizziness during these episodes. He denies any other aggravating or alleviating factors.   ED Course: He was found to be tachycardic with HRs up to 160s. He was given fluid and OT cardizem IV. His rates slowed to 140s. EKG showed A fib RVR. He was placed on a cardizem gtt. TRH was called for admission.    Assessment & Plan:   Principal Problem:   Atrial fibrillation with RVR ([place])  #1 new onset afib likely precipitated by multifactorial reasons -dehydration and AKI and chemo-CHA2DS2-VASc score is 3  Patient on diltiazem 60 mg every 6 with low-dose beta-blocker. DC heparin Eliquis started 03/11/2021 Echo -ejection fraction 55 to 60%.  No regional wall motion abnormalities.  Right ventricular size is normal.  No evidence of MS or AS. TSH NL  #2 htn blood pressure 138/83.  On Cardizem and low-dose beta-blocker.     # 3 GERD/dysphagia secondary to radiation improving continue tube feeds and clear liquids as tolerated Continue chlorhexidine mouthwash and Magic mouthwash.  Patient has a PEG tube in place and is tolerating tube feeds.    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  Platelets improving.  Hemoglobin improving.  Still neutropenic.  #6 mouth sores chlorhexidine mouthwash  #7 AKI resolved with IV fluids  #8 constipation resolved with lactulose.   Estimated body mass index is 30.9 kg/m as calculated from the following:   Height as of this encounter: 5\' 7"  (1.702 m).   Weight as of this encounter: 89.5 kg.  DVT prophylaxis: Heparin  code Status: Full code  family Communication: None at bedside  disposition Plan:  Status is: Inpatient  Remains inpatient appropriate because: Still with some A. fib RVR on Cardizem just change Cardizem to p.o. monitoring closely overnight. Consultants:  Cardiology  Procedures: None Antimicrobials: None  Subjective:   Patient resting in bed awake and alert No chest pain shortness of breath palpitations   Objective: Vitals:   03/10/21 2300 03/11/21 0000 03/11/21 0416 03/11/21 0500  BP: 137/69 129/73 136/87 138/83  Pulse:   100   Resp: 19 14    Temp:   99 F (37.2 C)   TempSrc:   Oral   SpO2:   94%   Weight:   89.5 kg   Height:        Intake/Output Summary (Last 24 hours) at 03/11/2021 1348 Last data filed at 03/11/2021 0400 Gross per 24 hour  Intake 2626.13 ml  Output 2250 ml  Net 376.13 ml    Filed Weights   03/09/21 0500 03/10/21 0500 03/11/21 0416  Weight: 90 kg 90.4 kg 89.5 kg  Examination: Port right upper chest General exam: Appears calm and comfortable  Respiratory system: Clear to auscultation. Respiratory effort normal. Cardiovascular system: S1 & S2 heard, irregular irregular no JVD, murmurs, rubs, gallops or clicks. No pedal edema. Gastrointestinal system: Abdomen is nondistended, soft and nontender. No organomegaly or masses felt. Normal bowel sounds heard.peg in place. Central nervous system: Alert and oriented. No focal neurological deficits. Extremities: Trace edema Skin: No rashes, lesions or  ulcers Psychiatry: Judgement and insight appear normal. Mood & affect appropriate.     Data Reviewed: I have personally reviewed following labs and imaging studies  CBC: Recent Labs  Lab 03/05/21 1225 03/06/21 0412 03/07/21 0619 03/08/21 0344 03/09/21 0338 03/10/21 0128 03/11/21 0340  WBC 0.8*   < > 1.0* 0.7* 0.8* 1.1* 1.2*  NEUTROABS 0.6*  --  0.7* 0.4* 0.5* 0.5*  --   HGB 9.7*   < > 9.2* 8.9* 8.9* 9.6* 9.1*  HCT 29.1*   < > 27.9* 27.3* 27.2* 29.2* 27.6*  MCV 93.6   < > 95.9 94.8 95.1 95.1 96.2  PLT 95*   < > 123* 127* 141* 189 213   < > = values in this interval not displayed.    Basic Metabolic Panel: Recent Labs  Lab 03/05/21 1225 03/06/21 0412 03/07/21 0619 03/08/21 0344 03/09/21 0338 03/10/21 0128  NA 137 134* 135 135 134* 134*  K 4.4 4.2 4.2 4.2 4.1 4.5  CL 105 104 103 102 100 97*  CO2 27 24 27 29 29 30   GLUCOSE 141* 89 107* 113* 115* 121*  BUN 23 17 17 17 16 23   CREATININE 0.78 0.88 0.84 0.78 0.84 0.75  CALCIUM 8.1* 8.0* 8.2* 8.4* 8.4* 8.9  MG 1.9  --   --   --   --   --     GFR: Estimated Creatinine Clearance: 90.4 mL/min (by C-G formula based on SCr of 0.75 mg/dL). Liver Function Tests: Recent Labs  Lab 03/06/21 0412 03/07/21 0619 03/08/21 0344 03/09/21 0338 03/10/21 0128  AST 17 14* 13* 15 18  ALT 28 25 21 22 24   ALKPHOS 26* 27* 30* 29* 33*  BILITOT 0.9 0.7 0.5 0.8 0.6  PROT 5.9* 6.2* 6.1* 5.9* 6.5  ALBUMIN 3.2* 3.1* 3.1* 3.2* 3.3*    No results for input(s): LIPASE, AMYLASE in the last 168 hours. No results for input(s): AMMONIA in the last 168 hours. Coagulation Profile: No results for input(s): INR, PROTIME in the last 168 hours. Cardiac Enzymes: No results for input(s): CKTOTAL, CKMB, CKMBINDEX, TROPONINI in the last 168 hours. BNP (last 3 results) No results for input(s): PROBNP in the last 8760 hours. HbA1C: No results for input(s): HGBA1C in the last 72 hours. CBG: Recent Labs  Lab 03/10/21 2138 03/11/21 0003  03/11/21 0413 03/11/21 0759 03/11/21 1216  GLUCAP 140* 128* 105* 101* 102*    Lipid Profile: No results for input(s): CHOL, HDL, LDLCALC, TRIG, CHOLHDL, LDLDIRECT in the last 72 hours. Thyroid Function Tests: No results for input(s): TSH, T4TOTAL, FREET4, T3FREE, THYROIDAB in the last 72 hours.  Anemia Panel: No results for input(s): VITAMINB12, FOLATE, FERRITIN, TIBC, IRON, RETICCTPCT in the last 72 hours. Sepsis Labs: No results for input(s): PROCALCITON, LATICACIDVEN in the last 168 hours.  Recent Results (from the past 240 hour(s))  Resp Panel by RT-PCR (Flu A&B, Covid) Nasopharyngeal Swab     Status: None   Collection Time: 03/05/21  3:23 PM   Specimen: Nasopharyngeal Swab; Nasopharyngeal(NP) swabs in vial transport medium  Result  Value Ref Range Status   SARS Coronavirus 2 by RT PCR NEGATIVE NEGATIVE Final    Comment: (NOTE) SARS-CoV-2 target nucleic acids are NOT DETECTED.  The SARS-CoV-2 RNA is generally detectable in upper respiratory specimens during the acute phase of infection. The lowest concentration of SARS-CoV-2 viral copies this assay can detect is 138 copies/mL. A negative result does not preclude SARS-Cov-2 infection and should not be used as the sole basis for treatment or other patient management decisions. A negative result may occur with  improper specimen collection/handling, submission of specimen other than nasopharyngeal swab, presence of viral mutation(s) within the areas targeted by this assay, and inadequate number of viral copies(<138 copies/mL). A negative result must be combined with clinical observations, patient history, and epidemiological information. The expected result is Negative.  Fact Sheet for Patients:  EntrepreneurPulse.com.au  Fact Sheet for Healthcare Providers:  IncredibleEmployment.be  This test is no t yet approved or cleared by the [place] FDA and  has been authorized for  detection and/or diagnosis of SARS-CoV-2 by FDA under an Emergency Use Authorization (EUA). This EUA will remain  in effect (meaning this test can be used) for the duration of the COVID-19 declaration under Section 564(b)(1) of the Act, 21 U.S.C.section 360bbb-3(b)(1), unless the authorization is terminated  or revoked sooner.       Influenza A by PCR NEGATIVE NEGATIVE Final   Influenza B by PCR NEGATIVE NEGATIVE Final    Comment: (NOTE) The Xpert Xpress SARS-CoV-2/FLU/RSV plus assay is intended as an aid in the diagnosis of influenza from Nasopharyngeal swab specimens and should not be used as a sole basis for treatment. Nasal washings and aspirates are unacceptable for Xpert Xpress SARS-CoV-2/FLU/RSV testing.  Fact Sheet for Patients: EntrepreneurPulse.com.au  Fact Sheet for Healthcare Providers: IncredibleEmployment.be  This test is not yet approved or cleared by the [place] FDA and has been authorized for detection and/or diagnosis of SARS-CoV-2 by FDA under an Emergency Use Authorization (EUA). This EUA will remain in effect (meaning this test can be used) for the duration of the COVID-19 declaration under Section 564(b)(1) of the Act, 21 U.S.C. section 360bbb-3(b)(1), unless the authorization is terminated or revoked.  Performed at [facility], Branch [address], [place], [place] [address]           Radiology Studies: No results found.      Scheduled Meds:  apixaban  5 mg Per Tube BID   chlorhexidine  15 mL Mouth/Throat QID   Chlorhexidine Gluconate Cloth  6 each Topical Daily   diltiazem  60 mg Per Tube Q6H   feeding supplement (OSMOLITE 1.2 CAL)  237 mL Per Tube TID   feeding supplement (PROSource TF)  45 mL Per Tube TID   free water  180 mL Per Tube TID   latanoprost  1 drop Both Eyes QHS   magic mouthwash w/lidocaine  5 mL Oral TID   metoprolol tartrate  25 mg Per Tube BID   pantoprazole  sodium  40 mg Per Tube Q1200   sodium chloride flush  10-40 mL Intracatheter Q12H   sucralfate  1 g Oral BID   timolol  1 drop Both Eyes Daily   Continuous Infusions:     LOS: 6 days   Georgette Shell, MD 03/11/2021, 1:48 PM

## 2021-03-11 NOTE — TOC Benefit Eligibility Note (Signed)
Patient Teacher, English as a foreign language completed.    The patient is currently admitted and upon discharge could be taking Eliquis or Xarelto.  The current 30 day co-pay is $45.00 for each.   The patient is insured through [employer].   Sharlette Dense, CPhT Pharmacy Patient Advocate Specialist [place] Patient Advocate Team Direct Number: [phone]  Fax: [phone]

## 2021-03-11 NOTE — Progress Notes (Signed)
NUTRITION NOTE  Patient's current TF regimen order is for 474 ml (2 cartons) Osmolite 1.5 TID with 180 ml free water per TF bolus and 45 ml Prosource TF BID.  This regimen provides 2210 kcal, 111 grams protein, and 1626 ml free water.  The hospital is currently out of 237 ml cartons of Osmolite 1.5.  Will change TF regimen temporarily for this purpose to 474 ml (2 cartons) Osmolite 1.2 TID with 45 ml Prosource TF TID and 180 ml free water per TF bolus.  This regimen will provide 1830 kcal (89% kcal need), 112 grams protein, and 1710 ml free water.    Estimated Nutritional Needs:  Kcal:  2050-2300 kcal Protein:  105-120 grams Fluid:  >/= 2.1 L/day    Jarome Matin, MS, RD, LDN, CNSC Inpatient Clinical Dietitian RD pager # available in [facility]  After hours/weekend pager # available in [facility]

## 2021-03-11 NOTE — Discharge Instructions (Signed)

## 2021-03-11 NOTE — TOC Progression Note (Signed)
Transition of Care [facility]) - Progression Note    Patient Details  Name: Nicholas Daniels MRN: [id number] Date of Birth: Aug 06, 1950  Transition of Care [facility]) CM/SW Contact  Purcell Mouton, RN Phone Number: 03/11/2021, 12:03 PM  Clinical Narrative:     Spoke with pt's wife Nicholas Daniels, there are no HH needs at present time. Nicholas Daniels states that she pick Osmolite up from [facility].    Expected Discharge Plan: Home/Self Care Barriers to Discharge: No Barriers Identified  Expected Discharge Plan and Services Expected Discharge Plan: Home/Self Care       Living arrangements for the past 2 months: Single Family Home                                       Social Determinants of Health (SDOH) Interventions    Readmission Risk Interventions No flowsheet data found.

## 2021-03-12 ENCOUNTER — Ambulatory Visit
Admission: RE | Admit: 2021-03-12 | Discharge: 2021-03-12 | Disposition: A | Discharge status: Home / Self Care | Payer: PPO | Source: Ambulatory Visit | Attending: Radiation Oncology | Admitting: Radiation Oncology

## 2021-03-12 ENCOUNTER — Encounter: Payer: Self-pay | Admitting: Radiation Oncology

## 2021-03-12 ENCOUNTER — Telehealth: Payer: Self-pay | Admitting: *Deleted

## 2021-03-12 ENCOUNTER — Ambulatory Visit: Payer: PPO

## 2021-03-12 LAB — CBC
HCT: 28.8 % — ABNORMAL LOW (ref 39.0–52.0)
Hemoglobin: 9.4 g/dL — ABNORMAL LOW (ref 13.0–17.0)
MCH: 31.3 pg (ref 26.0–34.0)
MCHC: 32.6 g/dL (ref 30.0–36.0)
MCV: 96 fL (ref 80.0–100.0)
Platelets: 255 10*3/uL (ref 150–400)
RBC: 3 MIL/uL — ABNORMAL LOW (ref 4.22–5.81)
RDW: 16.7 % — ABNORMAL HIGH (ref 11.5–15.5)
WBC: 1.4 10*3/uL — CL (ref 4.0–10.5)
nRBC: 2.9 % — ABNORMAL HIGH (ref 0.0–0.2)

## 2021-03-12 LAB — GLUCOSE, CAPILLARY
Glucose-Capillary: 118 mg/dL — ABNORMAL HIGH (ref 70–99)
Glucose-Capillary: 168 mg/dL — ABNORMAL HIGH (ref 70–99)
Glucose-Capillary: 81 mg/dL (ref 70–99)
Glucose-Capillary: 98 mg/dL (ref 70–99)

## 2021-03-12 MED ORDER — APIXABAN 5 MG PO TABS
5.0000 mg | ORAL_TABLET | Freq: Two times a day (BID) | ORAL | 4 refills | Status: DC
Start: 2021-03-12 — End: 2022-02-06

## 2021-03-12 MED ORDER — DILTIAZEM HCL 60 MG PO TABS: 60.0000 mg | ORAL_TABLET | Freq: Three times a day (TID) | ORAL | Status: DC

## 2021-03-12 MED ORDER — CHLORHEXIDINE GLUCONATE 0.12 % MT SOLN: 15.0000 mL | Freq: Four times a day (QID) | OROMUCOSAL | 0 refills | Status: DC

## 2021-03-12 MED ORDER — HEPARIN SOD (PORK) LOCK FLUSH 100 UNIT/ML IV SOLN
500.0000 [IU] | INTRAVENOUS | Status: AC | PRN
  Administered 2021-03-12: 500 [IU]
  Filled 2021-03-12: qty 5

## 2021-03-12 MED ORDER — DILTIAZEM HCL 90 MG PO TABS: 90.0000 mg | ORAL_TABLET | Freq: Three times a day (TID) | ORAL | 4 refills | Status: DC

## 2021-03-12 MED ORDER — MAGIC MOUTHWASH W/LIDOCAINE
5.0000 mL | Freq: Three times a day (TID) | ORAL | 0 refills | Status: DC
Start: 2021-03-12 — End: 2021-07-01

## 2021-03-12 MED ORDER — METOPROLOL TARTRATE 25 MG PO TABS: 25.0000 mg | ORAL_TABLET | Freq: Three times a day (TID) | ORAL | 4 refills | Status: DC

## 2021-03-12 MED ORDER — METOPROLOL TARTRATE 25 MG PO TABS
25.0000 mg | ORAL_TABLET | Freq: Three times a day (TID) | ORAL | Status: DC
  Administered 2021-03-12: 25 mg via ORAL
  Filled 2021-03-12 (×3): qty 1

## 2021-03-12 MED ORDER — DILTIAZEM HCL 90 MG PO TABS: 90.0000 mg | ORAL_TABLET | Freq: Three times a day (TID) | ORAL | Status: DC

## 2021-03-12 NOTE — Plan of Care (Signed)

## 2021-03-12 NOTE — Progress Notes (Signed)
Oncology Nurse Navigator Documentation   Met with Mr. Roback after final RT to offer support and to celebrate end of radiation treatment.   Provided verbal/written post-RT guidance: Importance of keeping all follow-up appts, especially those with Nutrition and SLP. I will reschedule his appointment with SLP after he is discharged from the hospital.  Importance of protecting treatment area from sun. Continuation of Sonafine application 2-3 times daily, application of antibiotic ointment to areas of raw skin; when supply of Sonafine exhausted transition to OTC lotion with vitamin E. Provided/reviewed Epic calendar of upcoming appts. Explained my role as navigator will continue for several more months, encouraged him to call me with needs/concerns.    Harlow Asa RN, BSN, OCN Head & Neck Oncology Nurse [place] [facility] Phone # [phone]  Fax # [phone]

## 2021-03-12 NOTE — Discharge Summary (Signed)
Physician Discharge Summary  Nicholas Daniels XBD:[id number] DOB: 1950/08/03 DOA: 03/05/2021  PCP: Leanna Battles, MD  Admit date: 03/05/2021 Discharge date: 03/12/2021  Admitted From: Home Disposition: Home  Recommendations for Outpatient Follow-up:  Follow up with PCP in 1-2 weeks Please obtain BMP/CBC in one week Please follow up with Dr. Alen Blew  Home Health: None Equipment/Devices: None  Discharge Condition: Stable CODE STATUS: Full code Diet recommendation: Cardiac Brief/Interim Summary:  Nicholas Daniels is a 70 y.o. 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 Presenting with A. fib RVR.  He was treated with IV Cardizem and heparin. Discharge Diagnoses:  Principal Problem:   Atrial fibrillation with RVR ([place])  #1 new onset afib likely precipitated by multifactorial reasons -dehydration and AKI and chemo-CHA2DS2-VASc score is 3  He was treated with IV heparin and Cardizem.  He was discharged home on Cardizem 90 mg 3 times a day, metoprolol 25 mg 3 times a day, and Eliquis through the PEG tube. Echo -ejection fraction 55 to 60%.  No regional wall motion abnormalities.  Right ventricular size is normal.  No evidence of MS or AS. TSH NL   #2 htn blood pressure 138/83.  Continue above medications.   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  Platelets improving.  Hemoglobin improving.  Still neutropenic.  At the time of discharge his hemoglobin was 9.4 platelets were 255 and WBC count was 1.4.   #6 mouth sores chlorhexidine mouthwash   #7 AKI resolved with IV fluids   #8 constipation resolved with lactulose.      Nutrition Problem: Increased nutrient needs Etiology: cancer  and cancer related treatments    Signs/Symptoms: estimated needs     Interventions: Tube feeding  Estimated body mass index is 30.13 kg/m as calculated from the following:   Height as of this encounter: 5\' 7"  (1.702 m).   Weight as of this encounter: 87.3 kg.  Discharge Instructions  Discharge Instructions     Diet - low sodium heart healthy   Complete by: As directed    Increase activity slowly   Complete by: As directed       Allergies as of 03/12/2021   No Known Allergies      Medication List     STOP taking these medications    aspirin 81 MG EC tablet   clotrimazole 1 % cream Commonly known as: [facility] Clotrimazole   diltiazem 120 MG 24 hr capsule Commonly known as: CARDIZEM CD   HYDROcodone-acetaminophen 7.5-325 MG tablet Commonly known as: Norco       TAKE these medications    apixaban 5 MG Tabs tablet Commonly known as: ELIQUIS Place 1 tablet (5 mg total) into feeding tube 2 (two) times daily.   Calcium 500/D 500-5 MG-MCG Tabs Generic drug: Calcium Carb-Cholecalciferol Take 1 tablet by mouth at bedtime.   chlorhexidine 0.12 % solution Commonly known as: PERIDEX Use as directed 15 mLs in the mouth or throat 4 (four) times daily.   diltiazem 90 MG tablet Commonly known as: CARDIZEM Take 1 tablet (90 mg total) by mouth 3 (three) times daily.   feeding supplement (OSMOLITE 1.5 CAL) Liqd Osmolite 1.5 - Give 7 cartons split over four feedings/day via PEG. Flush tube  with 90 ml water before and after each feeding. Drink by mouth or give via tube additional 2 cups fluids/day. This provides 2485 kcal, 104.3 g protein, 2467 ml total water. Meets 100% estimated needs.   Flaxseed Oil 1200 MG Caps Take 2,400 mg by mouth at bedtime.   guaiFENesin-dextromethorphan 100-10 MG/5ML syrup Commonly known as: ROBITUSSIN DM Take 5-10 mLs by mouth every 4 (four) hours as needed for cough.   latanoprost 0.005 % ophthalmic solution Commonly known as:  XALATAN Place 1 drop into both eyes at bedtime.   lidocaine 2 % solution Commonly known as: XYLOCAINE Patient: Mix 1part 2% viscous lidocaine, 1part H20. Swish & swallow 56mL of diluted mixture, 23min before meals and at bedtime, up to QID   lidocaine-prilocaine cream Commonly known as: EMLA Apply 1 application topically as needed.   magic mouthwash w/lidocaine Soln Take 5 mLs by mouth 3 (three) times daily.   metoprolol tartrate 25 MG tablet Commonly known as: LOPRESSOR Take 1 tablet (25 mg total) by mouth 3 (three) times daily.   multivitamin tablet Take 1 tablet by mouth at bedtime.   niacin 500 MG CR capsule Take 500 mg by mouth at bedtime.   omeprazole 40 MG capsule Commonly known as: PRILOSEC TAKE 1 CAPSULE BY MOUTH EVERY DAY   ondansetron 8 MG disintegrating tablet Commonly known as: Zofran ODT Take 1 tablet (8 mg total) by mouth every 8 (eight) hours as needed for nausea or vomiting.   prochlorperazine 10 MG tablet Commonly known as: COMPAZINE Take 1 tablet (10 mg total) by mouth every 6 (six) hours as needed for nausea or vomiting.   sucralfate 1 g tablet Commonly known as: CARAFATE Take 1 tablet (1 g total) by mouth 2 (two) times daily.   timolol 0.5 % ophthalmic solution Commonly known as: TIMOPTIC Place 1 drop into both eyes daily.   vitamin D3 50 MCG (2000 UT) Caps Take 2,000 Units by mouth at bedtime.        Follow-up Information     Leanna Battles, MD Follow up.   Specialty: Internal Medicine Contact information: [place] [address] [place], [place] information: [place] [address] [phone]         Buford Dresser, MD .   Specialty: Cardiology Contact information: [address] [place] [address] [phone]                No Known Allergies  Consultations: Cardiology and  oncology   Procedures/Studies: DG Chest 1 View  Result Date: 03/08/2021 CLINICAL DATA:  Hypoxia EXAM: CHEST  1 VIEW COMPARISON:  Chest radiograph 03/05/2021 FINDINGS: A right chest wall port is in stable position. Median sternotomy wires are stable. The cardiomediastinal silhouette is stable. There is no focal consolidation or pulmonary edema. There is no new or worsening airspace disease. There is no pleural effusion or pneumothorax. The bones are stable. IMPRESSION: Stable chest with no radiographic evidence of acute cardiopulmonary process. Electronically Signed   By: Valetta Mole M.D.   On: 03/08/2021 16:45   DG Chest Port 1 View  Result Date: 03/05/2021 CLINICAL DATA:  Tachycardia EXAM: PORTABLE CHEST 1 VIEW COMPARISON:  2016 FINDINGS: Right chest wall port catheter tip overlies SVC. No consolidation or edema. No pleural effusion or pneumothorax. Cardiomediastinal contours are within normal limits for technique. IMPRESSION: No acute process in the chest. Electronically Signed  By: Macy Mis M.D.   On: 03/05/2021 13:32   ECHOCARDIOGRAM COMPLETE  Result Date: 03/07/2021    ECHOCARDIOGRAM REPORT   Patient Name:   JULIE PAOLINI [facility] Date of Exam: 03/07/2021 Medical Rec #:  [id number]       Height:       67.0 in Accession #:    [id number]      Weight:       198.2 lb Date of Birth:  [birth year]      BSA:          2.014 m Patient Age:    89 years        BP:           111/73 mmHg Patient Gender: M               HR:           113 bpm. Exam Location:  Inpatient Procedure: 2D Echo Indications:    atrial fibrillation  History:        Patient has no prior history of Echocardiogram examinations.                 Risk Factors:Hypertension.  Sonographer:    [place] Referring Phys: [id number] [place]  1. Left ventricular ejection fraction, by estimation, is 55 to 60%. The left ventricle has normal function. The left ventricle has no regional wall motion abnormalities. Left  ventricular diastolic function could not be evaluated.  2. Right ventricular systolic function is normal. The right ventricular size is normal. There is moderately elevated pulmonary artery systolic pressure.  3. The mitral valve is normal in structure. Mild mitral valve regurgitation. No evidence of mitral stenosis.  4. The aortic valve is tricuspid. Aortic valve regurgitation is not visualized. No aortic stenosis is present.  5. The inferior vena cava is dilated in size with <50% respiratory variability, suggesting right atrial pressure of 15 mmHg. Comparison(s): No prior Echocardiogram. FINDINGS  Left Ventricle: Left ventricular ejection fraction, by estimation, is 55 to 60%. The left ventricle has normal function. The left ventricle has no regional wall motion abnormalities. The left ventricular internal cavity size was normal in size. There is  no left ventricular hypertrophy. Left ventricular diastolic function could not be evaluated due to atrial fibrillation. Left ventricular diastolic function could not be evaluated. Right Ventricle: The right ventricular size is normal. Right ventricular systolic function is normal. There is moderately elevated pulmonary artery systolic pressure. The tricuspid regurgitant velocity is 3.16 m/s, and with an assumed right atrial pressure of 15 mmHg, the estimated right ventricular systolic pressure is 92.3 mmHg. Left Atrium: Left atrial size was normal in size. Right Atrium: Right atrial size was normal in size. Pericardium: There is no evidence of pericardial effusion. Mitral Valve: The mitral valve is normal in structure. Mild mitral valve regurgitation. No evidence of mitral valve stenosis. Tricuspid Valve: The tricuspid valve is normal in structure. Tricuspid valve regurgitation is trivial. No evidence of tricuspid stenosis. Aortic Valve: The aortic valve is tricuspid. Aortic valve regurgitation is not visualized. No aortic stenosis is present. Pulmonic Valve: The  pulmonic valve was normal in structure. Pulmonic valve regurgitation is trivial. No evidence of pulmonic stenosis. Aorta: The aortic root is normal in size and structure. Venous: The inferior vena cava is dilated in size with less than 50% respiratory variability, suggesting right atrial pressure of 15 mmHg. IAS/Shunts: No atrial level shunt detected by color flow Doppler.  LEFT VENTRICLE PLAX 2D  LVIDd:         4.10 cm LVIDs:         2.60 cm LV PW:         1.10 cm LV IVS:        0.90 cm LVOT diam:     1.90 cm LVOT Area:     2.84 cm  IVC IVC diam: 2.50 cm LEFT ATRIUM           Index        RIGHT ATRIUM           Index LA diam:      3.70 cm 1.84 cm/m   RA Area:     14.90 cm LA Vol (A2C): 57.4 ml 28.49 ml/m  RA Volume:   36.10 ml  17.92 ml/m   AORTA Ao Root diam: 3.10 cm Ao Asc diam:  2.90 cm TRICUSPID VALVE TR Peak grad:   39.9 mmHg TR Vmax:        316.00 cm/s  SHUNTS Systemic Diam: 1.90 cm Kirk Ruths MD Electronically signed by Kirk Ruths MD Signature Date/Time: 03/07/2021/4:45:29 PM    Final    (Echo, Carotid, EGD, Colonoscopy, ERCP)    Subjective:  Patient is resting in bed anxious to go home no complaints Discharge Exam: Vitals:   03/12/21 1200 03/12/21 1500  BP: 100/70 114/68  Pulse: 85   Resp: 16   Temp: 99 F (37.2 C)   SpO2: 98%    Vitals:   03/12/21 0100 03/12/21 0500 03/12/21 1200 03/12/21 1500  BP: 125/81 117/83 100/70 114/68  Pulse:  (!) 106 85   Resp: 16 19 16    Temp:  99 F (37.2 C) 99 F (37.2 C)   TempSrc:  Oral Oral   SpO2:  98% 98%   Weight:  87.3 kg    Height:  5\' 7"  (1.702 m)      General: Pt is alert, awake, not in acute distress Cardiovascular: Irregular irregular S1/S2 +, no rubs, no gallops Respiratory: CTA bilaterally, no wheezing, no rhonchi Abdominal: Soft, NT, ND, bowel sounds + Extremities: no edema, no cyanosis    The results of significant diagnostics from this hospitalization (including imaging, microbiology, ancillary and laboratory)  are listed below for reference.     Microbiology: Recent Results (from the past 240 hour(s))  Resp Panel by RT-PCR (Flu A&B, Covid) Nasopharyngeal Swab     Status: None   Collection Time: 03/05/21  3:23 PM   Specimen: Nasopharyngeal Swab; Nasopharyngeal(NP) swabs in vial transport medium  Result Value Ref Range Status   SARS Coronavirus 2 by RT PCR NEGATIVE NEGATIVE Final    Comment: (NOTE) SARS-CoV-2 target nucleic acids are NOT DETECTED.  The SARS-CoV-2 RNA is generally detectable in upper respiratory specimens during the acute phase of infection. The lowest concentration of SARS-CoV-2 viral copies this assay can detect is 138 copies/mL. A negative result does not preclude SARS-Cov-2 infection and should not be used as the sole basis for treatment or other patient management decisions. A negative result may occur with  improper specimen collection/handling, submission of specimen other than nasopharyngeal swab, presence of viral mutation(s) within the areas targeted by this assay, and inadequate number of viral copies(<138 copies/mL). A negative result must be combined with clinical observations, patient history, and epidemiological information. The expected result is Negative.  Fact Sheet for Patients:  EntrepreneurPulse.com.au  Fact Sheet for Healthcare Providers:  IncredibleEmployment.be  This test is no t yet approved or cleared by the [place]  FDA and  has been authorized for detection and/or diagnosis of SARS-CoV-2 by FDA under an Emergency Use Authorization (EUA). This EUA will remain  in effect (meaning this test can be used) for the duration of the COVID-19 declaration under Section 564(b)(1) of the Act, 21 U.S.C.section 360bbb-3(b)(1), unless the authorization is terminated  or revoked sooner.       Influenza A by PCR NEGATIVE NEGATIVE Final   Influenza B by PCR NEGATIVE NEGATIVE Final    Comment: (NOTE) The Xpert Xpress  SARS-CoV-2/FLU/RSV plus assay is intended as an aid in the diagnosis of influenza from Nasopharyngeal swab specimens and should not be used as a sole basis for treatment. Nasal washings and aspirates are unacceptable for Xpert Xpress SARS-CoV-2/FLU/RSV testing.  Fact Sheet for Patients: EntrepreneurPulse.com.au  Fact Sheet for Healthcare Providers: IncredibleEmployment.be  This test is not yet approved or cleared by the [place] FDA and has been authorized for detection and/or diagnosis of SARS-CoV-2 by FDA under an Emergency Use Authorization (EUA). This EUA will remain in effect (meaning this test can be used) for the duration of the COVID-19 declaration under Section 564(b)(1) of the Act, 21 U.S.C. section 360bbb-3(b)(1), unless the authorization is terminated or revoked.  Performed at [facility], [place] [address] [place], [place] [address]      Labs: BNP (last 3 results) No results for input(s): BNP in the last 8760 hours. Basic Metabolic Panel: Recent Labs  Lab 03/06/21 0412 03/07/21 0619 03/08/21 0344 03/09/21 0338 03/10/21 0128  NA 134* 135 135 134* 134*  K 4.2 4.2 4.2 4.1 4.5  CL 104 103 102 100 97*  CO2 24 27 29 29 30   GLUCOSE 89 107* 113* 115* 121*  BUN 17 17 17 16 23   CREATININE 0.88 0.84 0.78 0.84 0.75  CALCIUM 8.0* 8.2* 8.4* 8.4* 8.9   Liver Function Tests: Recent Labs  Lab 03/06/21 0412 03/07/21 0619 03/08/21 0344 03/09/21 0338 03/10/21 0128  AST 17 14* 13* 15 18  ALT 28 25 21 22 24   ALKPHOS 26* 27* 30* 29* 33*  BILITOT 0.9 0.7 0.5 0.8 0.6  PROT 5.9* 6.2* 6.1* 5.9* 6.5  ALBUMIN 3.2* 3.1* 3.1* 3.2* 3.3*   No results for input(s): LIPASE, AMYLASE in the last 168 hours. No results for input(s): AMMONIA in the last 168 hours. CBC: Recent Labs  Lab 03/07/21 0619 03/08/21 0344 03/09/21 0338 03/10/21 0128 03/11/21 0340 03/12/21 0313  WBC 1.0* 0.7* 0.8* 1.1* 1.2* 1.4*  NEUTROABS  0.7* 0.4* 0.5* 0.5*  --   --   HGB 9.2* 8.9* 8.9* 9.6* 9.1* 9.4*  HCT 27.9* 27.3* 27.2* 29.2* 27.6* 28.8*  MCV 95.9 94.8 95.1 95.1 96.2 96.0  PLT 123* 127* 141* 189 213 255   Cardiac Enzymes: No results for input(s): CKTOTAL, CKMB, CKMBINDEX, TROPONINI in the last 168 hours. BNP: Invalid input(s): POCBNP CBG: Recent Labs  Lab 03/11/21 2137 03/12/21 0023 03/12/21 0454 03/12/21 0754 03/12/21 1156  GLUCAP 93 168* 81 118* 98   D-Dimer No results for input(s): DDIMER in the last 72 hours. Hgb A1c No results for input(s): HGBA1C in the last 72 hours. Lipid Profile No results for input(s): CHOL, HDL, LDLCALC, TRIG, CHOLHDL, LDLDIRECT in the last 72 hours. Thyroid function studies No results for input(s): TSH, T4TOTAL, T3FREE, THYROIDAB in the last 72 hours.  Invalid input(s): FREET3 Anemia work up No results for input(s): VITAMINB12, FOLATE, FERRITIN, TIBC, IRON, RETICCTPCT in the last 72 hours. Urinalysis    Component Value Date/Time  COLORURINE AMBER (A) 02/18/2021 1152   APPEARANCEUR HAZY (A) 02/18/2021 1152   LABSPEC 1.021 02/18/2021 1152   PHURINE 5.0 02/18/2021 1152   GLUCOSEU NEGATIVE 02/18/2021 1152   HGBUR NEGATIVE 02/18/2021 1152   [place] 02/18/2021 1152   KETONESUR NEGATIVE 02/18/2021 1152   PROTEINUR NEGATIVE 02/18/2021 1152   NITRITE NEGATIVE 02/18/2021 1152   LEUKOCYTESUR NEGATIVE 02/18/2021 1152   Sepsis Labs Invalid input(s): PROCALCITONIN,  WBC,  LACTICIDVEN Microbiology Recent Results (from the past 240 hour(s))  Resp Panel by RT-PCR (Flu A&B, Covid) Nasopharyngeal Swab     Status: None   Collection Time: 03/05/21  3:23 PM   Specimen: Nasopharyngeal Swab; Nasopharyngeal(NP) swabs in vial transport medium  Result Value Ref Range Status   SARS Coronavirus 2 by RT PCR NEGATIVE NEGATIVE Final    Comment: (NOTE) SARS-CoV-2 target nucleic acids are NOT DETECTED.  The SARS-CoV-2 RNA is generally detectable in upper respiratory specimens  during the acute phase of infection. The lowest concentration of SARS-CoV-2 viral copies this assay can detect is 138 copies/mL. A negative result does not preclude SARS-Cov-2 infection and should not be used as the sole basis for treatment or other patient management decisions. A negative result may occur with  improper specimen collection/handling, submission of specimen other than nasopharyngeal swab, presence of viral mutation(s) within the areas targeted by this assay, and inadequate number of viral copies(<138 copies/mL). A negative result must be combined with clinical observations, patient history, and epidemiological information. The expected result is Negative.  Fact Sheet for Patients:  EntrepreneurPulse.com.au  Fact Sheet for Healthcare Providers:  IncredibleEmployment.be  This test is no t yet approved or cleared by the [place] FDA and  has been authorized for detection and/or diagnosis of SARS-CoV-2 by FDA under an Emergency Use Authorization (EUA). This EUA will remain  in effect (meaning this test can be used) for the duration of the COVID-19 declaration under Section 564(b)(1) of the Act, 21 U.S.C.section 360bbb-3(b)(1), unless the authorization is terminated  or revoked sooner.       Influenza A by PCR NEGATIVE NEGATIVE Final   Influenza B by PCR NEGATIVE NEGATIVE Final    Comment: (NOTE) The Xpert Xpress SARS-CoV-2/FLU/RSV plus assay is intended as an aid in the diagnosis of influenza from Nasopharyngeal swab specimens and should not be used as a sole basis for treatment. Nasal washings and aspirates are unacceptable for Xpert Xpress SARS-CoV-2/FLU/RSV testing.  Fact Sheet for Patients: EntrepreneurPulse.com.au  Fact Sheet for Healthcare Providers: IncredibleEmployment.be  This test is not yet approved or cleared by the [place] FDA and has been authorized for detection  and/or diagnosis of SARS-CoV-2 by FDA under an Emergency Use Authorization (EUA). This EUA will remain in effect (meaning this test can be used) for the duration of the COVID-19 declaration under Section 564(b)(1) of the Act, 21 U.S.C. section 360bbb-3(b)(1), unless the authorization is terminated or revoked.  Performed at [facility], [place] [address], [place], [place] [address]      Time coordinating discharge:  39 minutes  SIGNED:  Georgette Shell, MD  [facility] 03/12/2021, 3:58 PM

## 2021-03-12 NOTE — Progress Notes (Signed)
Pt dc'd iv removed. Pt dc will all belongings. Pt wheelchaired out

## 2021-03-12 NOTE — Telephone Encounter (Signed)
CALLED PATIENT TO INFORM THAT DR. SQUIRE WANTS TO SEE HER HUSBAND ON 03/27/21 @ 4:10 PM, SPOKE WITH PATIENT'S WIFE- KAREN AND SHE AGREED TO 03/27/21 @ 4:10 PM

## 2021-03-12 NOTE — Care Management Important Message (Signed)
Important Message  Patient Details IM Letter placed in Patients room. Name: Nicholas Daniels MRN: [id number] Date of Birth: 1950-10-26   Medicare Important Message Given:  Yes     Kerin Salen 03/12/2021, 11:44 AM

## 2021-03-12 NOTE — Progress Notes (Addendum)
Progress Note  Patient Name: Nicholas Daniels Date of Encounter: 03/12/2021  Primary Cardiologist: Buford Dresser, MD  Subjective   No complaints - HR elevated at times. Now on Eliquis per tube. Diltiazem 60 mg q6 hrs. Still having some breakthrough arrhythmias - PVC's or fusion beats.  Inpatient Medications    Scheduled Meds:  apixaban  5 mg Per Tube BID   chlorhexidine  15 mL Mouth/Throat QID   Chlorhexidine Gluconate Cloth  6 each Topical Daily   diltiazem  60 mg Per Tube Q6H   feeding supplement (OSMOLITE 1.2 CAL)  474 mL Per Tube TID   feeding supplement (PROSource TF)  45 mL Per Tube TID   free water  180 mL Per Tube TID   latanoprost  1 drop Both Eyes QHS   magic mouthwash w/lidocaine  5 mL Oral TID   metoprolol tartrate  25 mg Per Tube BID   pantoprazole sodium  40 mg Per Tube Q1200   sodium chloride flush  10-40 mL Intracatheter Q12H   sucralfate  1 g Oral BID   timolol  1 drop Both Eyes Daily   Continuous Infusions:   PRN Meds: dextromethorphan-guaiFENesin, ondansetron (ZOFRAN) IV, phenol, sodium chloride flush   Vital Signs    Vitals:   03/11/21 2300 03/11/21 2307 03/12/21 0100 03/12/21 0500  BP: 140/85 140/85 125/81 117/83  Pulse:    (!) 106  Resp: 19 18 16 19   Temp:    99 F (37.2 C)  TempSrc:    Oral  SpO2:    98%  Weight:    87.3 kg  Height:    5\' 7"  (1.702 m)    Intake/Output Summary (Last 24 hours) at 03/12/2021 1133 Last data filed at 03/12/2021 0955 Gross per 24 hour  Intake 547 ml  Output 2900 ml  Net -2353 ml   Last 3 Weights 03/12/2021 03/11/2021 03/10/2021  Weight (lbs) 192 lb 6.4 oz 197 lb 4.8 oz 199 lb 3.2 oz  Weight (kg) 87.272 kg 89.495 kg 90.357 kg     Telemetry    Afib 60s to 70s, PVC's or fusion beats  - Personally Reviewed  Physical Exam   General appearance: alert and no distress Neck: no carotid bruit, no JVD, and thyroid not enlarged, symmetric, no tenderness/mass/nodules Lungs: clear to auscultation  bilaterally Heart: irregularly irregular rhythm Abdomen: soft, non-tender; bowel sounds normal; no masses,  no organomegaly Extremities: extremities normal, atraumatic, no cyanosis or edema Pulses: 2+ and symmetric Skin: Skin color, texture, turgor normal. No rashes or lesions Neurologic: Grossly normal Psych: Pleasant  Labs    High Sensitivity Troponin:   Recent Labs  Lab 03/05/21 1225 03/05/21 1425  TROPONINIHS 11 12      Cardiac EnzymesNo results for input(s): TROPONINI in the last 168 hours. No results for input(s): TROPIPOC in the last 168 hours.   Chemistry Recent Labs  Lab 03/08/21 0344 03/09/21 0338 03/10/21 0128  NA 135 134* 134*  K 4.2 4.1 4.5  CL 102 100 97*  CO2 29 29 30   GLUCOSE 113* 115* 121*  BUN 17 16 23   CREATININE 0.78 0.84 0.75  CALCIUM 8.4* 8.4* 8.9  PROT 6.1* 5.9* 6.5  ALBUMIN 3.1* 3.2* 3.3*  AST 13* 15 18  ALT 21 22 24   ALKPHOS 30* 29* 33*  BILITOT 0.5 0.8 0.6  GFRNONAA >60 >60 >60  ANIONGAP 4* 5 7     Hematology Recent Labs  Lab 03/10/21 0128 03/11/21 0340 03/12/21 0313  WBC 1.1*  1.2* 1.4*  RBC 3.07* 2.87* 3.00*  HGB 9.6* 9.1* 9.4*  HCT 29.2* 27.6* 28.8*  MCV 95.1 96.2 96.0  MCH 31.3 31.7 31.3  MCHC 32.9 33.0 32.6  RDW 15.9* 16.4* 16.7*  PLT 189 213 255    BNPNo results for input(s): BNP, PROBNP in the last 168 hours.   DDimer No results for input(s): DDIMER in the last 168 hours.   Radiology    No results found.  Cardiac Studies   2D echo 03/07/21   1. Left ventricular ejection fraction, by estimation, is 55 to 60%. The  left ventricle has normal function. The left ventricle has no regional  wall motion abnormalities. Left ventricular diastolic function could not  be evaluated.   2. Right ventricular systolic function is normal. The right ventricular  size is normal. There is moderately elevated pulmonary artery systolic  pressure.   3. The mitral valve is normal in structure. Mild mitral valve  regurgitation. No  evidence of mitral stenosis.   4. The aortic valve is tricuspid. Aortic valve regurgitation is not  visualized. No aortic stenosis is present.   5. The inferior vena cava is dilated in size with <50% respiratory  variability, suggesting right atrial pressure of 15 mmHg.   Comparison(s): No prior Echocardiogram.   Patient Profile     70 y.o. 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 Admitted with dysphagia/mouth pain/soreness, vomiting, weakness for several weeks, found to have pancytopenia, AKI with elevated BUN, and new onset atrial fib/flutter.    Assessment & Plan    1. New onset atrial fibrillation/flutter - Rates improved with addtion of metoprolol yesterday. Will increase to metoprolol to 25 mg q8h. Switch diltiazem to 90 mg q8 hrs - given with the metoprolol - do not recommend long-acting diltiazem and it cannot be crushed and may not be well-absorbed through tube -switched to Eliquis per tube - recommend DCCV if he persists in afib after 3 weeks of anticoagulation  2  Mod pulmonary HTN   Consider outpt sleep eval for OSA   3. New RBBB - noted, no evidence of bradycardia   3. Pancytopenia in the context of chemotherapy for SCC (new from 02/18/21), also here with AKI, dysphagia - per primary team   4. Coronary calcification seen on CT 2016 No CP   - hold ASA given eventual need for [facility] - consider statin, can defer to OP setting when patient clinically improved    5. Essential HTN - BP controlled on current regimen    6. Remotely diagnosed PFO by coronary CT 2016 - no hx of stroke  Ok to d/c home today from a cardiac standpoint.  For questions or updates, please contact [place] Please consult [email or web address] for contact info under Cardiology/STEMI.  Pixie Casino, MD, [facility], [place] Director of the [facility]  Cardiovascular [facility] Reduction Clinic Diplomate of the American Board of Clinical Lipidology Attending Cardiologist  Direct Dial: [phone]  Fax: [phone]  Website:  [email or web address]  Pixie Casino, MD 03/12/2021, 11:33 AM

## 2021-03-13 ENCOUNTER — Encounter: Payer: Self-pay | Admitting: Physical Therapy

## 2021-03-13 ENCOUNTER — Ambulatory Visit: Payer: PPO

## 2021-03-14 ENCOUNTER — Encounter: Payer: PPO | Admitting: Dietician

## 2021-03-14 ENCOUNTER — Telehealth: Payer: Self-pay | Admitting: Dietician

## 2021-03-14 NOTE — Telephone Encounter (Signed)
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 S/p left neck dissection 9/12. PEG placement on 10/24. He completed final radiation therapy 12/6.   -hospitalized 11/29-12/6 with new onset a-fib with RVR Patient will not receive additional chemotherapy given his cytopenia as well as atrial fibrillation. Plans to proceed with active surveillance moving forward per Dr. Alen Blew.   Spoke with wife of patient via telephone. She reports patient continues to have intermittent increased heart rate since being home. She reports patient is fatigued, but glad to be home. She is reports having difficulty with getting some of his medications, reports having to go to 3 different pharmacies due to medication shortages. Wife reports receiving financial hardship paper work from [employer]. She has not had time to complete this yet, reports paperwork is extensive. Patient is not eating/drinking orally. Wife reports he is giving 6-7 of Osmolite 1.5 via tube/day, he is tolerating well. Reports patient has nausea with occasional vomiting when coughing up phlegm.   Medications: cardizem, lopressor, magic mouthwash, eliquis, peridex  Labs: 12/6 - WBC 1.4  Anthropometrics: Last weight 192 lb 6.4 oz on 12/6 decreased from 198 lb 6.4 oz on 11/29.  198 lb 8 oz on 11/22  194.9 lb on 11/15 196.6 lb on 11/7   NUTRITION DIAGNOSIS: Inadequate oral intake continues, pt relying on feeding tube   INTERVENTION:  Continue 6 cartons Osmolite 1.5 split over four feedings/day. Flush tube with 90 ml water before and after each feeding Will contact [employer] and request extension for financial hardship paper work due date Wife reports having plenty formula and supplies Patient has contact information    MONITORING, EVALUATION, GOAL: weight trends, intake    NEXT VISIT: Wednesday December 21 in office

## 2021-03-25 ENCOUNTER — Ambulatory Visit: Payer: PPO | Attending: Radiation Oncology

## 2021-03-25 ENCOUNTER — Other Ambulatory Visit: Payer: Self-pay

## 2021-03-25 DIAGNOSIS — R1310 Dysphagia, unspecified: Secondary | ICD-10-CM | POA: Insufficient documentation

## 2021-03-26 NOTE — Therapy (Signed)
[facility] [address], [place], [place], [address] Phone: [phone]   Fax:  [phone]  Speech Language Pathology Treatment  Patient Details  Name: Nicholas Daniels MRN: [id number] Date of Birth: 12-03-1950 Referring Provider (SLP): Eppie Gibson, MD   Encounter Date: 03/25/2021   End of Session - 03/26/21 0853     Visit Number 2    Number of Visits 3    Date for SLP Re-Evaluation 05/08/21    SLP Start Time 7619    SLP Stop Time  1440    SLP Time Calculation (min) 37 min    Activity Tolerance Patient tolerated treatment well             Past Medical History:  Diagnosis Date   Cataract    removed bilateraly   Coronary artery calcification seen on CT scan    Cough    Essential hypertension    Glaucoma    Head and neck cancer ([place])    Hypertension    PFO (patent foramen ovale)    PFO noted on coronary CT 09/05/14   Prostate cancer ([place]) 2009    Past Surgical History:  Procedure Laterality Date   CATARACT EXTRACTION     bilateral and a wrinkle on retina on the left    CHOLECYSTECTOMY     COLONOSCOPY     DIRECT LARYNGOSCOPY N/A 12/17/2020   Procedure: DIRECT LARYNGOSCOPY WITH BIOPSIES;  Surgeon: Izora Gala, MD;  Location: [place];  Service: ENT;  Laterality: N/A;   ESOPHAGOSCOPY N/A 12/17/2020   Procedure: ESOPHAGOSCOPY;  Surgeon: Izora Gala, MD;  Location: [place];  Service: ENT;  Laterality: N/A;   IR GASTROSTOMY TUBE MOD SED  01/28/2021   IR IMAGING GUIDED PORT INSERTION  01/28/2021   PROSTATE SURGERY  2009   radiation treatment     RADICAL NECK DISSECTION Left 12/17/2020   Procedure: LEFT NECK DISSECTION;  Surgeon: Izora Gala, MD;  Location: [place];  Service: ENT;  Laterality: Left;   RESECTION OF MEDIASTINAL MASS N/A 02/26/2015   Procedure: RESECTION OF MEDIASTINAL MASS;  Surgeon: Ivin Poot, MD;  Location: [place];  Service: Thoracic;  Laterality: N/A;   STERNOTOMY N/A 02/26/2015   Procedure:  STERNOTOMY;  Surgeon: Ivin Poot, MD;  Location: [place] [place];  Service: Thoracic;  Laterality: N/A;   UPPER GASTROINTESTINAL ENDOSCOPY      There were no vitals filed for this visit.   Subjective Assessment - 03/25/21 1409     Subjective "My taste is - shooo."    Currently in Pain? No/denies                   ADULT SLP TREATMENT - 03/26/21 0001       General Information   Behavior/Cognition Alert;Cooperative;Pleasant mood      Treatment Provided   Treatment provided Dysphagia      Dysphagia Treatment   Respiratory Status Room air    Treatment Methods Skilled observation;Therapeutic exercise;Patient/caregiver education    Patient observed directly with PO's Yes    Type of PO's observed Thin liquids    Liquids provided via Cup    Oral Phase Signs & Symptoms Other (comment)   none noted   Pharyngeal Phase Signs & Symptoms Delayed cough   pt reports not due to "wrong pipe syndrome" but gagged due to thickened phlegm   Other treatment/comments Pt not eating solids yet - wants to try later this week. Is drinking small amounts  of water so SLP observed pt with warm water today as SLP told pt/wife that warm water may assist clearance of thick mucous/saliva. See above for overt s/sx aspiration. Pt told SLP rationale for HEP -pt has completed HEP approx 3 times/week prior to 2-3 weeks ago, now less mostly due to swallowing status. SLP encouaged pt to cycle through 1-3 reps of each exercise on HEP instead of 10 reps each and move on. Pt demonstrated understanding and thought that would help him be more consistent in completion. Nicholas Daniels completed HEP with modified independence.      Assessment / Recommendations / Plan   Plan Continue with current plan of care      Dysphagia Recommendations   Diet recommendations Thin liquid   as tolerated   Liquids provided via Cup    Medication Administration --   as tolerated     Progression Toward Goals   Progression toward goals Progressing  toward goals              SLP Education - 03/26/21 0849     Education Details exercises assist decreasing risk of muscle fibrosis and atrophy    Person(s) Educated Patient    Methods Explanation    Comprehension Verbalized understanding            Short Term Goals 03-25-21     SLP SHORT TERM GOAL #1      Title pt will complete HEP with rare min A    Time     Period --   vists, for all STGs    Status Achieved           SLP SHORT TERM GOAL #2    Title pt will tell SLP why pt is completing HEP with modified independence    Time     Status Achieved           SLP SHORT TERM GOAL #3    Title pt will describe 3 overt s/s aspiration PNA with modified independence    Time 2    Status Ongoing           SLP SHORT TERM GOAL #4    Title pt will tell SLP how a food journal could hasten return to a more normalized diet    Time 2    Status Ongoing                     Long Term Goals 02-07-21                 SLP LONG TERM GOAL #1    Title pt will complete HEP with modified independence over three visits    Time 3   03-26-21    Period --   visits, for all LTGs    Status Revised           SLP LONG TERM GOAL #2    Title pt will describe how to modify HEP over time, and the timeline associated with reduction in HEP frequency with modified independence over two sessions    Time 4    Status Ongoing       Plan - 02/07/21        Clinical Impression Statement At this time pt swallowing is deemed WNL/WFL with thin liquids. SLP reviewed pt's individualized HEP for dysphagia and pt completed each exercise on their own with modified independence. There are no overt s/s aspiration reported by pt at this time. Data indicate that pt's swallow ability  will likely decrease over the course of radiation/chemotherapy and could very well decline over time following conclusion of their radiation therapy due to muscle disuse atrophy and/or muscle fibrosis. Pt will cont to need to be seen by  SLP in order to assess safety of PO intake, assess the need for recommending any objective swallow assessment, and ensuring pt correctly completes the individualized HEP.    Speech Therapy Frequency --   once approx every four weeks    Duration --   90 days for this reporting period; overall, approx 7 visits    Treatment/Interventions Aspiration precaution training;Pharyngeal strengthening exercises;Diet toleration management by SLP;Trials of upgraded texture/liquids;Patient/family education;SLP instruction and feedback;Compensatory techniques    Potential to Achieve Goals Good    SLP Home Exercise Plan provided    Consulted and Agree with Plan of Care Patient          Patient will benefit from skilled therapeutic intervention in order to improve the following deficits and impairments:   Dysphagia, unspecified type    Problem List Patient Active Problem List   Diagnosis Date Noted   Atrial fibrillation with RVR ([place]) 03/05/2021   Port-A-Cath in place 02/05/2021   Head and neck cancer ([place]) 01/01/2021   SPK (superficial punctate keratitis), left 12/13/2020   Dry eyes, bilateral 12/13/2020   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 [place], [place], [address] Phone: [phone]   Fax:  [phone]   Name: Nicholas Daniels MRN: [id number] Date of Birth: [birth year]

## 2021-03-26 NOTE — Progress Notes (Signed)
Nicholas Daniels presents for follow up after completing radiation to oropharynx. Patient completed treatment on 03/12/21.  Pain issues, if any: no Using a feeding tube?: yes Weight changes, if any: lost 10 pounds since October 2022 Swallowing issues, if any: none Smoking or chewing tobacco? no Using fluoride trays daily? no Last ENT visit was on: 01/10/2021 Other notable issues, if any: mild shortness of breath with exertion, new onset atrial fibrillation due to chemo, had to stop chemo treatments  Vitals:   03/27/21 1617  BP: 100/67  Pulse: 85  Resp: 18  Temp: (!) 96.9 F (36.1 C)  TempSrc: Temporal  SpO2: 99%  Weight: 190 lb 6 oz (86.4 kg)  Height: 5\' 7"  (1.702 m)

## 2021-03-27 ENCOUNTER — Inpatient Hospital Stay: Payer: PPO | Attending: Oncology | Admitting: Dietician

## 2021-03-27 ENCOUNTER — Encounter: Payer: Self-pay | Admitting: Radiation Oncology

## 2021-03-27 ENCOUNTER — Other Ambulatory Visit: Payer: Self-pay

## 2021-03-27 ENCOUNTER — Ambulatory Visit
Admission: RE | Admit: 2021-03-27 | Discharge: 2021-03-27 | Disposition: A | Discharge status: Home / Self Care | Payer: PPO | Source: Ambulatory Visit | Attending: Radiation Oncology | Admitting: Radiation Oncology

## 2021-03-27 VITALS — BP 100/67 | HR 85 | Temp 96.9°F | Resp 18 | Ht 67.0 in | Wt 190.4 lb

## 2021-03-27 DIAGNOSIS — I4891 Unspecified atrial fibrillation: Secondary | ICD-10-CM | POA: Diagnosis not present

## 2021-03-27 DIAGNOSIS — Z9221 Personal history of antineoplastic chemotherapy: Secondary | ICD-10-CM | POA: Diagnosis not present

## 2021-03-27 DIAGNOSIS — Z7901 Long term (current) use of anticoagulants: Secondary | ICD-10-CM | POA: Insufficient documentation

## 2021-03-27 DIAGNOSIS — Z79899 Other long term (current) drug therapy: Secondary | ICD-10-CM | POA: Insufficient documentation

## 2021-03-27 DIAGNOSIS — C801 Malignant (primary) neoplasm, unspecified: Secondary | ICD-10-CM | POA: Insufficient documentation

## 2021-03-27 DIAGNOSIS — C770 Secondary and unspecified malignant neoplasm of lymph nodes of head, face and neck: Secondary | ICD-10-CM | POA: Insufficient documentation

## 2021-03-27 DIAGNOSIS — C760 Malignant neoplasm of head, face and neck: Secondary | ICD-10-CM

## 2021-03-27 DIAGNOSIS — I10 Essential (primary) hypertension: Secondary | ICD-10-CM | POA: Diagnosis not present

## 2021-03-27 HISTORY — DX: Unspecified atrial fibrillation: I48.91

## 2021-03-27 NOTE — Progress Notes (Signed)
Radiation Oncology         [phone] ________________________________  Name: Nicholas Daniels MRN: [id number]  Date: 03/27/2021  DOB: 12/07/1950  Follow-Up Visit Note  CC: Nicholas Battles, MD  Nicholas Gala, MD  Diagnosis and Prior Radiotherapy:       ICD-10-CM   1. 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  Nicholas Daniels presents for follow up after completing radiation to oropharynx. Patient completed treatment on 03/12/21.  Pain issues, if any: no Using a feeding tube?: yes Weight changes, if any: lost 10 pounds since October 2022 Swallowing issues, if any: swallowing very little at all Smoking or chewing tobacco? no Using fluoride trays daily? no Last ENT visit was on: 01/10/2021 Other notable issues, if any: mild shortness of breath with exertion, new onset atrial fibrillation due to chemo, had to stop chemo treatments  Vitals:   03/27/21 1617  BP: 100/67  Pulse: 85  Resp: 18  Temp: (!) 96.9 F (36.1 C)  TempSrc: Temporal  SpO2: 99%  Weight: 190 lb 6 oz (86.4 kg)  Height: 5\' 7"  (1.702 m)                         ALLERGIES:  has No Known Allergies.  Meds: Current Outpatient Medications  Medication Sig Dispense Refill   apixaban (ELIQUIS) 5 MG TABS tablet Place 1 tablet (5 mg total) into feeding tube 2 (two) times daily. 60 tablet 4   chlorhexidine (PERIDEX) 0.12 % solution Use as directed 15 mLs in the mouth or throat 4 (four) times daily. 120 mL 0   diltiazem (CARDIZEM) 90 MG tablet Take 1 tablet (90 mg total) by mouth 3 (three) times daily. 90 tablet 4   guaiFENesin-dextromethorphan (ROBITUSSIN DM) 100-10 MG/5ML syrup Take 5-10 mLs by mouth every 4 (four) hours as needed for cough. 473 mL 3   latanoprost (XALATAN) 0.005 % ophthalmic solution Place 1 drop  into both eyes at bedtime.     lidocaine (XYLOCAINE) 2 % solution Patient: Mix 1part 2% viscous lidocaine, 1part H20. Swish & swallow 12mL of diluted mixture, 51min before meals and at bedtime, up to QID 200 mL 3   magic mouthwash w/lidocaine SOLN Take 5 mLs by mouth 3 (three) times daily. 50 mL 0   metoprolol tartrate (LOPRESSOR) 25 MG tablet Take 1 tablet (25 mg total) by mouth 3 (three) times daily. 90 tablet 4   Nutritional Supplements (FEEDING SUPPLEMENT, OSMOLITE 1.5 CAL,) LIQD Osmolite 1.5 - Give 7 cartons split over four feedings/day via PEG. Flush tube with 90 ml water before and after each feeding. Drink by mouth or give via tube additional 2 cups fluids/day. This provides 2485 kcal, 104.3 g protein, 2467 ml total water. Meets 100% estimated needs. 1659 mL    ondansetron (ZOFRAN ODT) 8 MG disintegrating tablet Take 1 tablet (8 mg total) by mouth every 8 (eight) hours as needed for nausea or vomiting. 20 tablet 0   timolol (TIMOPTIC) 0.5 % ophthalmic solution Place 1 drop into both eyes daily.     calcium-vitamin D (CALCIUM 500/D) 500-200 MG-UNIT tablet Take 1 tablet by mouth at bedtime. (Patient not taking: Reported on 03/27/2021)     Flaxseed, Linseed, (FLAXSEED OIL) 1200  MG CAPS Take 2,400 mg by mouth at bedtime. (Patient not taking: Reported on 03/27/2021)     lidocaine-prilocaine (EMLA) cream Apply 1 application topically as needed. (Patient not taking: Reported on 03/27/2021) 30 g 0   Multiple Vitamin (MULTIVITAMIN) tablet Take 1 tablet by mouth at bedtime. (Patient not taking: Reported on 03/27/2021)     niacin 500 MG CR capsule Take 500 mg by mouth at bedtime. (Patient not taking: Reported on 03/27/2021)     omeprazole (PRILOSEC) 40 MG capsule TAKE 1 CAPSULE BY MOUTH EVERY DAY (Patient not taking: Reported on 03/27/2021) 90 capsule 3   prochlorperazine (COMPAZINE) 10 MG tablet Take 1 tablet (10 mg total) by mouth every 6 (six) hours as needed for nausea or vomiting. (Patient not taking:  Reported on 03/27/2021) 30 tablet 0   sucralfate (CARAFATE) 1 g tablet Take 1 tablet (1 g total) by mouth 2 (two) times daily. (Patient not taking: Reported on 03/27/2021) 60 tablet 1   Vitamin D, Cholecalciferol, 50 MCG (2000 UT) CAPS Take 2,000 Units by mouth at bedtime. (Patient not taking: Reported on 03/27/2021)     No current facility-administered medications for this encounter.    Physical Findings: The patient is in no acute distress. Patient is alert and oriented. Wt Readings from Last 3 Encounters:  03/27/21 190 lb 6 oz (86.4 kg)  03/12/21 192 lb 6.4 oz (87.3 kg)  03/05/21 198 lb 6.4 oz (90 kg)    height is 5\' 7"  (1.702 m) and weight is 190 lb 6 oz (86.4 kg). His temporal temperature is 96.9 F (36.1 C) (abnormal). His blood pressure is 100/67 and his pulse is 85. His respiration is 18 and oxygen saturation is 99%. .  General: Alert and oriented, in no acute distress HEENT: Head is normocephalic. Extraocular movements are intact. Oropharynx is notable for thick saliva Neck: Neck is notable for no masses Skin: Skin in treatment fields shows  residual dryness Psychiatric: Judgment and insight are intact. Affect is appropriate.   Lab Findings: Lab Results  Component Value Date   WBC 1.4 (LL) 03/12/2021   HGB 9.4 (L) 03/12/2021   HCT 28.8 (L) 03/12/2021   MCV 96.0 03/12/2021   PLT 255 03/12/2021    Lab Results  Component Value Date   TSH 0.500 03/07/2021    Radiographic Findings: DG Chest 1 View  Result Date: 03/08/2021 CLINICAL DATA:  Hypoxia EXAM: CHEST  1 VIEW COMPARISON:  Chest radiograph 03/05/2021 FINDINGS: A right chest wall port is in stable position. Median sternotomy wires are stable. The cardiomediastinal silhouette is stable. There is no focal consolidation or pulmonary edema. There is no new or worsening airspace disease. There is no pleural effusion or pneumothorax. The bones are stable. IMPRESSION: Stable chest with no radiographic evidence of acute  cardiopulmonary process. Electronically Signed   By: Valetta Mole M.D.   On: 03/08/2021 16:45   DG Chest Port 1 View  Result Date: 03/05/2021 CLINICAL DATA:  Tachycardia EXAM: PORTABLE CHEST 1 VIEW COMPARISON:  2016 FINDINGS: Right chest wall port catheter tip overlies SVC. No consolidation or edema. No pleural effusion or pneumothorax. Cardiomediastinal contours are within normal limits for technique. IMPRESSION: No acute process in the chest. Electronically Signed   By: Macy Mis M.D.   On: 03/05/2021 13:32   ECHOCARDIOGRAM COMPLETE  Result Date: 03/07/2021    ECHOCARDIOGRAM REPORT   Patient Name:   Nicholas Daniels [facility] Date of Exam: 03/07/2021 Medical Rec #:  [id number]  Height:       67.0 in Accession #:    [id number]      Weight:       198.2 lb Date of Birth:  1950/04/14      BSA:          2.014 m Patient Age:    20 years        BP:           111/73 mmHg Patient Gender: M               HR:           113 bpm. Exam Location:  Inpatient Procedure: 2D Echo Indications:    atrial fibrillation  History:        Patient has no prior history of Echocardiogram examinations.                 Risk Factors:Hypertension.  Sonographer:    O'[place] Referring Phys: [id number] [place]  1. Left ventricular ejection fraction, by estimation, is 55 to 60%. The left ventricle has normal function. The left ventricle has no regional wall motion abnormalities. Left ventricular diastolic function could not be evaluated.  2. Right ventricular systolic function is normal. The right ventricular size is normal. There is moderately elevated pulmonary artery systolic pressure.  3. The mitral valve is normal in structure. Mild mitral valve regurgitation. No evidence of mitral stenosis.  4. The aortic valve is tricuspid. Aortic valve regurgitation is not visualized. No aortic stenosis is present.  5. The inferior vena cava is dilated in size with <50% respiratory variability, suggesting right atrial  pressure of 15 mmHg. Comparison(s): No prior Echocardiogram. FINDINGS  Left Ventricle: Left ventricular ejection fraction, by estimation, is 55 to 60%. The left ventricle has normal function. The left ventricle has no regional wall motion abnormalities. The left ventricular internal cavity size was normal in size. There is  no left ventricular hypertrophy. Left ventricular diastolic function could not be evaluated due to atrial fibrillation. Left ventricular diastolic function could not be evaluated. Right Ventricle: The right ventricular size is normal. Right ventricular systolic function is normal. There is moderately elevated pulmonary artery systolic pressure. The tricuspid regurgitant velocity is 3.16 m/s, and with an assumed right atrial pressure of 15 mmHg, the estimated right ventricular systolic pressure is 44.3 mmHg. Left Atrium: Left atrial size was normal in size. Right Atrium: Right atrial size was normal in size. Pericardium: There is no evidence of pericardial effusion. Mitral Valve: The mitral valve is normal in structure. Mild mitral valve regurgitation. No evidence of mitral valve stenosis. Tricuspid Valve: The tricuspid valve is normal in structure. Tricuspid valve regurgitation is trivial. No evidence of tricuspid stenosis. Aortic Valve: The aortic valve is tricuspid. Aortic valve regurgitation is not visualized. No aortic stenosis is present. Pulmonic Valve: The pulmonic valve was normal in structure. Pulmonic valve regurgitation is trivial. No evidence of pulmonic stenosis. Aorta: The aortic root is normal in size and structure. Venous: The inferior vena cava is dilated in size with less than 50% respiratory variability, suggesting right atrial pressure of 15 mmHg. IAS/Shunts: No atrial level shunt detected by color flow Doppler.  LEFT VENTRICLE PLAX 2D LVIDd:         4.10 cm LVIDs:         2.60 cm LV PW:         1.10 cm LV IVS:        0.90 cm LVOT diam:  1.90 cm LVOT Area:     2.84 cm  IVC  IVC diam: 2.50 cm LEFT ATRIUM           Index        RIGHT ATRIUM           Index LA diam:      3.70 cm 1.84 cm/m   RA Area:     14.90 cm LA Vol (A2C): 57.4 ml 28.49 ml/m  RA Volume:   36.10 ml  17.92 ml/m   AORTA Ao Root diam: 3.10 cm Ao Asc diam:  2.90 cm TRICUSPID VALVE TR Peak grad:   39.9 mmHg TR Vmax:        316.00 cm/s  SHUNTS Systemic Diam: 1.90 cm Kirk Ruths MD Electronically signed by Kirk Ruths MD Signature Date/Time: 03/07/2021/4:45:29 PM    Final     Impression/Plan:    1) Head and Neck Cancer Status: healing from radiation well  2) Nutritional Status: PEG dependent Wt Readings from Last 3 Encounters:  03/27/21 190 lb 6 oz (86.4 kg)  03/12/21 192 lb 6.4 oz (87.3 kg)  03/05/21 198 lb 6.4 oz (90 kg)     3)   Swallowing: We had a serious talk about how critical it is for him to perform his swallowing exercises and escalate diet as tolerated, even if food tastes bad. Risk of fibrosis in throat/esophagus is significant without this.  4) Dental: Encouraged to continue regular followup with dentistry, and dental hygiene including fluoride rinses.  Dr Benson Norway in Feb.  5) Thyroid function:  check annually in med/onc Lab Results  Component Value Date   TSH 0.500 03/07/2021    6) Other: apply Vit E oil to dry skin in RT fields  7) Follow-up in March with CT neck/chest. The patient was encouraged to call with any issues or questions before then.  On date of service, in total, I spent 25 minutes on this encounter. Patient was seen in person. _____________________________________   Eppie Gibson, MD

## 2021-03-28 ENCOUNTER — Other Ambulatory Visit: Payer: Self-pay

## 2021-03-28 ENCOUNTER — Encounter (HOSPITAL_BASED_OUTPATIENT_CLINIC_OR_DEPARTMENT_OTHER): Payer: Self-pay | Admitting: Cardiology

## 2021-03-28 ENCOUNTER — Ambulatory Visit (INDEPENDENT_AMBULATORY_CARE_PROVIDER_SITE_OTHER): Payer: PPO | Admitting: Cardiology

## 2021-03-28 VITALS — BP 118/70 | HR 86 | Ht 67.0 in | Wt 191.0 lb

## 2021-03-28 DIAGNOSIS — Z7901 Long term (current) use of anticoagulants: Secondary | ICD-10-CM

## 2021-03-28 DIAGNOSIS — C760 Malignant neoplasm of head, face and neck: Secondary | ICD-10-CM | POA: Diagnosis not present

## 2021-03-28 DIAGNOSIS — I4819 Other persistent atrial fibrillation: Secondary | ICD-10-CM | POA: Diagnosis not present

## 2021-03-28 DIAGNOSIS — D6869 Other thrombophilia: Secondary | ICD-10-CM | POA: Diagnosis not present

## 2021-03-28 DIAGNOSIS — C770 Secondary and unspecified malignant neoplasm of lymph nodes of head, face and neck: Secondary | ICD-10-CM

## 2021-03-28 NOTE — Progress Notes (Signed)
Cardiology Office Note:    Date:  03/28/2021   ID:  Nicholas, Daniels [birth year], MRN [id number]  PCP:  Leanna Battles, MD  Cardiologist:  Buford Dresser, MD  Referring MD: Leanna Battles, MD   CC: post hospital follow up  History of Present Illness:    Nicholas Daniels is a 70 y.o. 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 I met him during his recent hospitalization (discharged 03/12/21)  Today,  He is accompanied with his wife. He has felt somewhat better since being discharged from the hospital, but he remains unable to eat much and takes most of his medications via his PEG tube. He has phlegmm frequently, and nothing tastes good to him.   His wife states that he does not know when he has palpitations.  He reports that he experiences fatigue, SOB, and loose stools.  For his diet his wife says that he eats Jell-O cups.  He has been complaint with his medications.  He his due for eye surgery soon due to a wrinkle in his retina.  He denies any chest pain. No lightheadedness, headaches, syncope, orthopnea, PND, or lower extremity edema.    Past Medical History:  Diagnosis Date   Atrial fibrillation ([place])    Cataract    removed bilateraly   Coronary artery calcification seen on CT scan    Cough    Essential hypertension    Glaucoma    Head and neck cancer ([facility])    Hypertension    PFO (patent foramen ovale)    PFO noted on coronary CT 09/05/14   Prostate cancer ([place]) 2009    Past Surgical History:  Procedure Laterality Date   CATARACT EXTRACTION     bilateral and a wrinkle on retina on the left    CHOLECYSTECTOMY     COLONOSCOPY     DIRECT LARYNGOSCOPY N/A 12/17/2020   Procedure: DIRECT LARYNGOSCOPY WITH BIOPSIES;  Surgeon: Izora Gala, MD;  Location: [facility];  Service: ENT;  Laterality: N/A;   ESOPHAGOSCOPY N/A 12/17/2020    Procedure: ESOPHAGOSCOPY;  Surgeon: Izora Gala, MD;  Location: [facility];  Service: ENT;  Laterality: N/A;   IR GASTROSTOMY TUBE MOD SED  01/28/2021   IR IMAGING GUIDED PORT INSERTION  01/28/2021   PROSTATE SURGERY  2009   radiation treatment     RADICAL NECK DISSECTION Left 12/17/2020   Procedure: LEFT NECK DISSECTION;  Surgeon: Izora Gala, MD;  Location: [facility];  Service: ENT;  Laterality: Left;   RESECTION OF MEDIASTINAL MASS N/A 02/26/2015   Procedure: RESECTION OF MEDIASTINAL MASS;  Surgeon: Ivin Poot, MD;  Location: [facility];  Service: Thoracic;  Laterality: N/A;   STERNOTOMY N/A 02/26/2015   Procedure: STERNOTOMY;  Surgeon: Ivin Poot, MD;  [facility];  Service: Thoracic;  Laterality: N/A;   UPPER GASTROINTESTINAL ENDOSCOPY      Current Medications: Current Outpatient Medications on File Prior to Visit  Medication Sig   apixaban (ELIQUIS) 5 MG TABS tablet Place 1 tablet (5 mg total) into feeding tube 2 (two) times daily.   chlorhexidine (PERIDEX) 0.12 % solution Use as directed 15 mLs in the mouth or throat 4 (four) times daily.   diltiazem (CARDIZEM) 90 MG tablet Take 1 tablet (90 mg total) by mouth 3 (three) times daily.   guaiFENesin-dextromethorphan (ROBITUSSIN DM) 100-10 MG/5ML syrup Take 5-10 mLs by  mouth every 4 (four) hours as needed for cough.   latanoprost (XALATAN) 0.005 % ophthalmic solution Place 1 drop into both eyes at bedtime.   metoprolol tartrate (LOPRESSOR) 25 MG tablet Take 1 tablet (25 mg total) by mouth 3 (three) times daily.   Nutritional Supplements (FEEDING SUPPLEMENT, OSMOLITE 1.5 CAL,) LIQD Osmolite 1.5 - Give 7 cartons split over four feedings/day via PEG. Flush tube with 90 ml water before and after each feeding. Drink by mouth or give via tube additional 2 cups fluids/day. This provides 2485 kcal, 104.3 g protein, 2467 ml total water. Meets 100% estimated needs.   ondansetron (ZOFRAN ODT) 8 MG disintegrating tablet Take 1 tablet (8 mg total)  by mouth every 8 (eight) hours as needed for nausea or vomiting.   sucralfate (CARAFATE) 1 g tablet Take 1 tablet (1 g total) by mouth 2 (two) times daily.   timolol (TIMOPTIC) 0.5 % ophthalmic solution Place 1 drop into both eyes daily.   calcium-vitamin D (CALCIUM 500/D) 500-200 MG-UNIT tablet Take 1 tablet by mouth at bedtime. (Patient not taking: Reported on 03/27/2021)   Flaxseed, Linseed, (FLAXSEED OIL) 1200 MG CAPS Take 2,400 mg by mouth at bedtime. (Patient not taking: Reported on 03/27/2021)   lidocaine (XYLOCAINE) 2 % solution Patient: Mix 1part 2% viscous lidocaine, 1part H20. Swish & swallow 51m of diluted mixture, 384m before meals and at bedtime, up to QID (Patient not taking: Reported on 03/28/2021)   lidocaine-prilocaine (EMLA) cream Apply 1 application topically as needed. (Patient not taking: Reported on 03/27/2021)   magic mouthwash w/lidocaine SOLN Take 5 mLs by mouth 3 (three) times daily. (Patient not taking: Reported on 03/28/2021)   Multiple Vitamin (MULTIVITAMIN) tablet Take 1 tablet by mouth at bedtime. (Patient not taking: Reported on 03/27/2021)   niacin 500 MG CR capsule Take 500 mg by mouth at bedtime. (Patient not taking: Reported on 03/27/2021)   omeprazole (PRILOSEC) 40 MG capsule TAKE 1 CAPSULE BY MOUTH EVERY DAY (Patient not taking: Reported on 03/27/2021)   prochlorperazine (COMPAZINE) 10 MG tablet Take 1 tablet (10 mg total) by mouth every 6 (six) hours as needed for nausea or vomiting. (Patient not taking: Reported on 03/27/2021)   Vitamin D, Cholecalciferol, 50 MCG (2000 UT) CAPS Take 2,000 Units by mouth at bedtime. (Patient not taking: Reported on 03/27/2021)   No current facility-administered medications on file prior to visit.     Allergies:   Patient has no known allergies.   Social History   Tobacco Use   Smoking status: Never    Passive exposure: Past   Smokeless tobacco: Never  Vaping Use   Vaping Use: Never used  Substance Use Topics    Alcohol use: No   Drug use: No    Family History: family history includes COPD in his brother, father, and mother; Cancer in his brother; Colon polyps in an other family member; Diabetes in his mother; Heart disease in his brother and mother; Liver cancer in his brother; Pneumonia in his mother. There is no history of Esophageal cancer, Rectal cancer, or Stomach cancer.  ROS:   Please see the history of present illness. (+) Lose Stools (+) Shortness of Breath (+) Phlegm (+) Palpitations (but he does not feel them) All other systems are reviewed and negative.     EKGs/Labs/Other Studies Reviewed:    The following studies were reviewed today:  Echo 03/07/2021:  1. Left ventricular ejection fraction, by estimation, is 55 to 60%. The  left ventricle has normal function.  The left ventricle has no regional  wall motion abnormalities. Left ventricular diastolic function could not  be evaluated.   2. Right ventricular systolic function is normal. The right ventricular  size is normal. There is moderately elevated pulmonary artery systolic  pressure.   3. The mitral valve is normal in structure. Mild mitral valve  regurgitation. No evidence of mitral stenosis.   4. The aortic valve is tricuspid. Aortic valve regurgitation is not  visualized. No aortic stenosis is present.   5. The inferior vena cava is dilated in size with <50% respiratory  variability, suggesting right atrial pressure of 15 mmHg.     EKG:  EKG is personally reviewed.   03/28/21: atrial fibrillation at 80 bpm  Recent Labs: 03/05/2021: Magnesium 1.9 03/07/2021: TSH 0.500 03/10/2021: ALT 24; BUN 23; Creatinine, Ser 0.75; Potassium 4.5; Sodium 134 03/12/2021: Hemoglobin 9.4; Platelets 255  Recent Lipid Panel No results found for: CHOL, TRIG, HDL, CHOLHDL, VLDL, LDLCALC, LDLDIRECT  Physical Exam:    VS:  BP 118/70    Pulse 86    Ht _0  (1.702 m)    Wt 191 lb (86.6 kg)    BMI 29.91 kg/m     Wt Readings from Last 3  Encounters:  03/28/21 191 lb (86.6 kg)  03/27/21 190 lb 6 oz (86.4 kg)  03/12/21 192 lb 6.4 oz (87.3 kg)    GEN: Well nourished, well developed in no acute distress HEENT: Normal, moist mucous membranes NECK: No JVD CARDIAC: irregularly irregular rhythm, normal S1 and S2, no rubs or gallops. No murmur. VASCULAR: Radial and DP pulses 2+ bilaterally. No carotid bruits RESPIRATORY:  Clear to auscultation without rales, wheezing or rhonchi  ABDOMEN: Soft, non-tender, non-distended MUSCULOSKELETAL:  Ambulates independently SKIN: Warm and dry, no edema NEUROLOGIC:  Alert and oriented x 3. No focal neuro deficits noted. PSYCHIATRIC:  Normal affect    ASSESSMENT:    1. Persistent atrial fibrillation ([place])   2. Secondary hypercoagulable state ([place])   3. Long term current use of anticoagulant   4. 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 No TEE given his SCC/radiation history -we discussed timing of cardioversion. He would like to continue to work on his oral intake and getting stronger before considering, We will re-evaluate in 3 mos or he will call me sooner if things change -no current bleeding issues -continue DOAC, diltiazem -once he is on oral routinely, can consolidate to long acting diltiazem  Cardiac risk counseling and prevention recommendations: -recommend heart healthy/Mediterranean diet, with whole grains, fruits, vegetable, fish, lean meats, nuts, and olive oil. Limit salt. -recommend moderate walking, 3-5 times/week for 30-50 minutes each session. Aim for at least 150 minutes.week. Goal should be pace of 3 miles/hours, or walking 1.5 miles in 30 minutes -recommend avoidance of tobacco products. Avoid excess alcohol. -ASCVD risk score: The ASCVD  Risk score (Arnett DK, et al., 2019) failed to calculate for the following reasons:   Cannot find a previous HDL lab   Cannot find a previous total cholesterol lab    Plan for follow up: 3 months or as needed  Buford Dresser, MD, PhD, [facility]    Medication Adjustments/Labs and Tests Ordered: Current medicines are reviewed at length with the patient today.  Concerns regarding medicines are outlined above.  No orders of the defined types were placed in this encounter.  No orders of the defined types were placed in this encounter.   Patient Instructions  Medication Instructions:  Your Physician recommend you continue on your current medication as directed.    *If you need a refill on your cardiac medications before your next appointment, please call your pharmacy*   Lab Work: None ordered today   Testing/Procedures: None ordered today   Follow-Up: At [facility], you and your health needs are our priority.  As part of our continuing mission to provide you with exceptional heart care, we have created designated Provider Care Teams.  These Care Teams include your primary Cardiologist (physician) and Advanced Practice Providers (APPs -  Physician Assistants and Nurse Practitioners) who all work together to provide you with the care you need, when you need it.  We recommend signing up for the patient portal called "MyChart".  Sign up information is provided on this After Visit Summary.  MyChart is used to connect with patients for Virtual Visits (Telemedicine).  Patients are able to view lab/test results, encounter notes, upcoming appointments, etc.  Non-urgent messages can be sent to your provider as well.   To learn more about what you can do with MyChart, go to [email or web address].    Your next appointment:   3 month(s)  The format for your next appointment:   In Person  Provider:   Buford Dresser, MD       I,Viann Nielson,acting as a scribe for Buford Dresser, MD.,have documented all relevant documentation on the behalf of Buford Dresser, MD,as directed by  Buford Dresser, MD while in the presence of Buford Dresser, MD.   I, Buford Dresser, MD, have reviewed all documentation for this visit. The documentation on 03/28/21 for the exam, diagnosis, procedures, and orders are all accurate and complete.  Signed, Buford Dresser, MD PhD 03/28/2021 4:15 PM    [facility]

## 2021-03-28 NOTE — Patient Instructions (Signed)

## 2021-03-29 ENCOUNTER — Ambulatory Visit: Payer: Self-pay | Admitting: Radiation Oncology

## 2021-03-29 ENCOUNTER — Encounter: Payer: Self-pay | Admitting: Oncology

## 2021-03-29 NOTE — Progress Notes (Signed)
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 S/p PEG on 10/24  Met with patient and wife in office. He reports ongoing thick saliva and no taste. Patient has not been using baking soda salt water rinses. Patient is not eating by mouth. He is taking small sips of water. Patient is giving 4 cartons of Osmolite 1.5 via tube/day. He is flushing 180 ml water QID. Patient reports maintaining his weight. He denies nausea, vomiting, constipation, diarrhea. Bowl movements are loose.   Medications: reviewed  Labs: reviewed   Anthropometrics: Patient weight 191 lb today   12/6 - 190 lb 6 oz 11/28 - 194 lb 8 oz  11/8 - 194 lb 1.9 oz   NUTRITION DIAGNOSIS: Inadequate oral intake continues, pt relying on tube feeding    INTERVENTION:  Reinforced importance of adequate calories and protein to promote healing Encouraged oral intake as tolerated Patient agrees to increase tube feedings to goal  -6 cartons Osmolite 1.5 split over four feedings/day. Flush tube with 90 ml water before and after each feeding Reviewed strategies for dry mouth/thick saliva - pt has handout Continue baking soda salt water rinses several times daily Complete HEP exercises per SLP Patient has contact information     MONITORING, EVALUATION, GOAL: weight trends, intake, tube feeding   NEXT VISIT: via telephone ~4 weeks

## 2021-03-29 NOTE — Progress Notes (Signed)
° °                                                                                                                                                          °  Patient Name: Nicholas Daniels MRN: [id number] DOB: 1950-06-27 Referring Physician: Izora Gala (Profile Not Attached) Date of Service: 03/12/2021 [facility], [place]                                                        End Of Treatment Note  Diagnoses: C77.0-Secondary and unspecified malignant neoplasm of lymph nodes of head, face and neck  Cancer Staging:  Cancer Staging  Head and neck cancer ([place]) Staging form: Pharynx - HPV-Mediated Oropharynx, AJCC 8th Edition - Clinical stage from 01/01/2021: Stage I (cT0, cN1, cM0, p16+) - Unsigned Stage prefix: Initial diagnosis  Intent: Curative  Radiation Treatment Dates: 01/29/2021 through 03/12/2021 Site Technique Total Dose (Gy) Dose per Fx (Gy) Completed Fx Beam Energies  Oropharynx: HN_bilat IMRT 60/60 2 30/30 6X   Narrative: The patient tolerated radiation therapy relatively well.   Plan: The patient will follow-up with radiation oncology in 1 half month. -----------------------------------  Eppie Gibson, MD

## 2021-03-29 NOTE — Addendum Note (Signed)
Addended by: Loel Dubonnet on: 03/29/2021 09:44 AM   Modules accepted: Orders

## 2021-04-10 ENCOUNTER — Ambulatory Visit: Payer: PPO | Admitting: Radiation Oncology

## 2021-04-16 DIAGNOSIS — Z95828 Presence of other vascular implants and grafts: Secondary | ICD-10-CM | POA: Diagnosis not present

## 2021-04-16 DIAGNOSIS — R1310 Dysphagia, unspecified: Secondary | ICD-10-CM | POA: Diagnosis not present

## 2021-04-16 DIAGNOSIS — C760 Malignant neoplasm of head, face and neck: Secondary | ICD-10-CM | POA: Diagnosis not present

## 2021-04-16 DIAGNOSIS — Z931 Gastrostomy status: Secondary | ICD-10-CM | POA: Diagnosis not present

## 2021-04-17 ENCOUNTER — Telehealth: Payer: Self-pay | Admitting: Oncology

## 2021-04-17 NOTE — Telephone Encounter (Signed)
Scheduled appointment per 1/10 scheduling message. Left message. Patient will be mailed updated calendar.

## 2021-04-22 ENCOUNTER — Ambulatory Visit: Payer: PPO | Attending: Radiation Oncology

## 2021-04-22 ENCOUNTER — Other Ambulatory Visit: Payer: Self-pay

## 2021-04-22 DIAGNOSIS — R1310 Dysphagia, unspecified: Secondary | ICD-10-CM | POA: Diagnosis not present

## 2021-04-22 NOTE — Patient Instructions (Signed)
   Signs of Aspiration Pneumonia   Chest pain/tightness Fever (can be low grade) Cough  With foul-smelling phlegm (sputum) With sputum containing pus or blood With greenish sputum Fatigue  Shortness of breath  Wheezing   **IF YOU HAVE THESE SIGNS, CONTACT YOUR DOCTOR OR GO TO THE EMERGENCY DEPARTMENT OR URGENT CARE AS SOON AS POSSIBLE**     

## 2021-04-22 NOTE — Therapy (Signed)
[facility] [address], [place], [place], [address] Phone: [phone]   Fax:  [phone]  Speech Language Pathology Treatment/Renewal Summary  Patient Details  Name: Nicholas Daniels MRN: [id number] Date of Birth: 03/25/1951 Referring Provider (SLP): Eppie Gibson, MD   Encounter Date: 04/22/2021   End of Session - 04/22/21 1601     Visit Number 3    Number of Visits 6    Date for SLP Re-Evaluation 07/21/21    SLP Start Time 1450    SLP Stop Time  2878    SLP Time Calculation (min) 40 min    Activity Tolerance Patient tolerated treatment well             Past Medical History:  Diagnosis Date   Atrial fibrillation ([place])    Cataract    removed bilateraly   Coronary artery calcification seen on CT scan    Cough    Essential hypertension    Glaucoma    Head and neck cancer ([place])    Hypertension    PFO (patent foramen ovale)    PFO noted on coronary CT 09/05/14   Prostate cancer ([place]) 2009    Past Surgical History:  Procedure Laterality Date   CATARACT EXTRACTION     bilateral and a wrinkle on retina on the left    CHOLECYSTECTOMY     COLONOSCOPY     DIRECT LARYNGOSCOPY N/A 12/17/2020   Procedure: DIRECT LARYNGOSCOPY WITH BIOPSIES;  Surgeon: Izora Gala, MD;  Location: [facility];  Service: ENT;  Laterality: N/A;   ESOPHAGOSCOPY N/A 12/17/2020   Procedure: ESOPHAGOSCOPY;  Surgeon: Izora Gala, MD;  Location: [facility];  Service: ENT;  Laterality: N/A;   IR GASTROSTOMY TUBE MOD SED  01/28/2021   IR IMAGING GUIDED PORT INSERTION  01/28/2021   PROSTATE SURGERY  2009   radiation treatment     RADICAL NECK DISSECTION Left 12/17/2020   Procedure: LEFT NECK DISSECTION;  Surgeon: Izora Gala, MD;  Location: [facility];  Service: ENT;  Laterality: Left;   RESECTION OF MEDIASTINAL MASS N/A 02/26/2015   Procedure: RESECTION OF MEDIASTINAL MASS;  Surgeon: Ivin Poot, MD;  Location: [facility];  Service: Thoracic;  Laterality: N/A;    STERNOTOMY N/A 02/26/2015   Procedure: STERNOTOMY;  Surgeon: Ivin Poot, MD;  Location: [facility];  Service: Thoracic;  Laterality: N/A;   UPPER GASTROINTESTINAL ENDOSCOPY      There were no vitals filed for this visit.   Subjective Assessment - 04/22/21 1453     Subjective Leftover [employer] pizza, honey bun, sweet tea.    Currently in Pain? No/denies                   ADULT SLP TREATMENT - 04/22/21 1556       General Information   Behavior/Cognition Alert;Cooperative;Pleasant mood      Treatment Provided   Treatment provided Dysphagia      Dysphagia Treatment   Treatment Methods Skilled observation;Therapeutic exercise;Compensation strategy training;Patient/caregiver education    Patient observed directly with PO's Yes    Type of PO's observed Regular;Thin liquids    Liquids provided via Cup    Oral Phase Signs & Symptoms Left pocketing   mild-cleared with oral rinse, due to lt facial nerve branch damage - pt reports feeling still coming back- better than last month   Pharyngeal Phase Signs & Symptoms Other (comment)   none noted   Other treatment/comments Pt having 1-2  cartons osmolite daily. SLP told pt he might consider switching to boost or ensure. Gray said, "Or I could just eat food," and SLP concurred. "The drainage (thcikened phlegm) is not as bad now." SLP provided min A occasionally for HEP. Pt reported he did HEP every other day since last session. SLP provided [place] overt s/sx aspiration PNA.      Assessment / Recommendations / Plan   Plan Continue with current plan of care      Dysphagia Recommendations   Diet recommendations Thin liquid;Regular;Dysphagia 3 (mechanical soft)    Liquids provided via Cup    Medication Administration Other (Comment)    Supervision --   as tolerated     Progression Toward Goals   Progression toward goals Progressing toward goals              SLP Education - 04/22/21 1527     Education Details procedure for  supraglottic/super supraglottic, tongue protrusion on [place]    Person(s) Educated Patient    Methods Explanation;Verbal cues;Handout;Demonstration    Comprehension Verbalized understanding;Returned demonstration;Verbal cues required;Need further instruction           Short Term Goals 04-22-21        SLP SHORT TERM GOAL #1      Title pt will complete HEP with rare min A    Time      Period --   vists, for all STGs    Status Achieved           SLP SHORT TERM GOAL #2    Title pt will tell SLP why pt is completing HEP with modified independence    Time      Status Achieved           SLP SHORT TERM GOAL #3    Title pt will describe 3 overt s/s aspiration PNA with modified independence    Time     Status Achieved           SLP SHORT TERM GOAL #4    Title pt will tell SLP how a food journal could hasten return to a more normalized diet    Time     Status Deferred - pt beginning to eat all consistencies now                     Long Term Goals 04-22-21                 SLP LONG TERM GOAL #1    Title pt will complete HEP with modified independence over three visits    Time 2   03-26-21    Period --   visits, for all LTGs    Status Revised           SLP LONG TERM GOAL #2    Title pt will describe how to modify HEP over time, and the timeline associated with reduction in HEP frequency with modified independence over two sessions    Time 3    Status Ongoing       Plan - 04-22-21       Clinical Impression Statement At this time pt swallowing is deemed WNL/WFL with dys III/regular diet and thin liquids. SLP reviewed pt's individualized HEP for dysphagia and pt completed each exercise on their own with occasional min A however by end of session pt was independent with HEP. There are no overt s/s aspiration reported by pt at this time. Data indicate that pt's swallow  ability will likely decrease over the course of radiation/chemotherapy and could very well decline over time  following conclusion of their radiation therapy due to muscle disuse atrophy and/or muscle fibrosis.  Pt will cont to need to be seen by SLP in order to assess safety of PO intake, assess the need for recommending any objective swallow assessment, and ensuring pt correctly completes the individualized HEP.    Speech Therapy Frequency --   once approx every four weeks    Duration --   90 days for this reporting period; overall, approx 7 visits    Treatment/Interventions Aspiration precaution training;Pharyngeal strengthening exercises;Diet toleration management by SLP;Trials of upgraded texture/liquids;Patient/family education;SLP instruction and feedback;Compensatory techniques    Potential to Achieve Goals Good    SLP Home Exercise Plan provided    Consulted and Agree with Plan of Care Patient           Patient will benefit from skilled therapeutic intervention in order to improve the following deficits and impairments:   Dysphagia, unspecified type    Problem List Patient Active Problem List   Diagnosis Date Noted   Atrial fibrillation with RVR ([place]) 03/05/2021   Port-A-Cath in place 02/05/2021   Head and neck cancer ([place]) 01/01/2021   SPK (superficial punctate keratitis), left 12/13/2020   Dry eyes, bilateral 12/13/2020   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 [place], [place], [address] Phone: [phone]   Fax:   [phone]   Name: ELOISE MULA MRN: [id number] Date of Birth: 12-30-1950

## 2021-05-03 ENCOUNTER — Other Ambulatory Visit: Payer: Self-pay

## 2021-05-03 ENCOUNTER — Inpatient Hospital Stay: Payer: PPO | Attending: Oncology | Admitting: Oncology

## 2021-05-03 ENCOUNTER — Inpatient Hospital Stay: Payer: PPO

## 2021-05-03 VITALS — BP 125/72 | HR 81 | Temp 97.6°F | Resp 18 | Ht 67.0 in | Wt 188.8 lb

## 2021-05-03 DIAGNOSIS — I4891 Unspecified atrial fibrillation: Secondary | ICD-10-CM | POA: Insufficient documentation

## 2021-05-03 DIAGNOSIS — Z8546 Personal history of malignant neoplasm of prostate: Secondary | ICD-10-CM

## 2021-05-03 DIAGNOSIS — C801 Malignant (primary) neoplasm, unspecified: Secondary | ICD-10-CM | POA: Insufficient documentation

## 2021-05-03 DIAGNOSIS — Z452 Encounter for adjustment and management of vascular access device: Secondary | ICD-10-CM | POA: Diagnosis not present

## 2021-05-03 DIAGNOSIS — Z79899 Other long term (current) drug therapy: Secondary | ICD-10-CM | POA: Insufficient documentation

## 2021-05-03 DIAGNOSIS — Z9079 Acquired absence of other genital organ(s): Secondary | ICD-10-CM | POA: Insufficient documentation

## 2021-05-03 DIAGNOSIS — Z923 Personal history of irradiation: Secondary | ICD-10-CM | POA: Diagnosis not present

## 2021-05-03 DIAGNOSIS — C61 Malignant neoplasm of prostate: Secondary | ICD-10-CM | POA: Diagnosis not present

## 2021-05-03 DIAGNOSIS — C760 Malignant neoplasm of head, face and neck: Secondary | ICD-10-CM

## 2021-05-03 DIAGNOSIS — Z7901 Long term (current) use of anticoagulants: Secondary | ICD-10-CM | POA: Insufficient documentation

## 2021-05-03 DIAGNOSIS — Z95828 Presence of other vascular implants and grafts: Secondary | ICD-10-CM

## 2021-05-03 DIAGNOSIS — C770 Secondary and unspecified malignant neoplasm of lymph nodes of head, face and neck: Secondary | ICD-10-CM | POA: Insufficient documentation

## 2021-05-03 LAB — CBC WITH DIFFERENTIAL (CANCER CENTER ONLY)
Abs Immature Granulocytes: 0.02 10*3/uL (ref 0.00–0.07)
Basophils Absolute: 0 10*3/uL (ref 0.0–0.1)
Basophils Relative: 0 %
Eosinophils Absolute: 0.2 10*3/uL (ref 0.0–0.5)
Eosinophils Relative: 5 %
HCT: 36.3 % — ABNORMAL LOW (ref 39.0–52.0)
Hemoglobin: 12.5 g/dL — ABNORMAL LOW (ref 13.0–17.0)
Immature Granulocytes: 0 %
Lymphocytes Relative: 25 %
Lymphs Abs: 1.2 10*3/uL (ref 0.7–4.0)
MCH: 33.2 pg (ref 26.0–34.0)
MCHC: 34.4 g/dL (ref 30.0–36.0)
MCV: 96.5 fL (ref 80.0–100.0)
Monocytes Absolute: 0.6 10*3/uL (ref 0.1–1.0)
Monocytes Relative: 13 %
Neutro Abs: 2.7 10*3/uL (ref 1.7–7.7)
Neutrophils Relative %: 57 %
Platelet Count: 235 10*3/uL (ref 150–400)
RBC: 3.76 MIL/uL — ABNORMAL LOW (ref 4.22–5.81)
RDW: 14.1 % (ref 11.5–15.5)
WBC Count: 4.7 10*3/uL (ref 4.0–10.5)
nRBC: 0 % (ref 0.0–0.2)

## 2021-05-03 MED ORDER — SODIUM CHLORIDE 0.9% FLUSH
10.0000 mL | Freq: Once | INTRAVENOUS | Status: AC
  Administered 2021-05-03: 10 mL

## 2021-05-03 MED ORDER — HEPARIN SOD (PORK) LOCK FLUSH 100 UNIT/ML IV SOLN
500.0000 [IU] | Freq: Once | INTRAVENOUS | Status: AC
  Administered 2021-05-03: 500 [IU]

## 2021-05-03 NOTE — Progress Notes (Signed)
Hematology and Oncology Follow Up Visit  ECTOR LAUREL [id number] 1950/06/01 71 y.o. 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   Secondary diagnosis: T3N0 Gleason score 3+4 = 7 prostate cancer diagnosed 2009.  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  Prior Therapy:  He underwent prostatectomy and found to have a T3a N0 Gleason score 3+4 = 7 cancer.  He underwent salvage radiation in 2012.   He developed a rise in his PSA and enrolled in the [employer] trial.   He was off enzalutamide since August 2021 and off clinical trial at that time.  His PSA started to rise in the last year with a PSA in May 2022 was 0.56.  Previously was 0.35 and it was undetectable in May 2021.  Prior to enzalutamide his PSA was as high as 8.27 and declined to undetectable level.  He is status post direct laryngoscopy and left neck dissection completed by Dr. Constance Holster on December 17, 2020.  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    Adjuvant radiation therapy with weekly cisplatin started on February 05, 2021.  He completed adjuvant radiation therapy with cisplatin in November 2022.  Current therapy:  Interim History: [name] returns today for a follow-up evaluation.  Since the last visit, his treatment was complicated with atrial fibrillation and required hospitalization at the end of November and was discharged on December 6.  Since his discharge, continues to recover slowly without any major complications.  He still has some issues with the tasting food but still able to eat and maintain his weight.  He has issues with swallowing pills and using his feeding tube.  He has also reported symptoms of dizziness and presyncope and  continues to follow with cardiology regarding that.     Medications: Updated on review. Current Outpatient Medications  Medication Sig Dispense Refill   apixaban (ELIQUIS) 5 MG TABS tablet Place 1 tablet (5 mg total) into feeding tube 2 (two) times daily. 60 tablet 4   calcium-vitamin D (CALCIUM 500/D) 500-200 MG-UNIT tablet Take 1 tablet by mouth at bedtime. (Patient not taking: Reported on 03/27/2021)     chlorhexidine (PERIDEX) 0.12 % solution Use as directed 15 mLs in the mouth or throat 4 (four) times daily. 120 mL 0   diltiazem (CARDIZEM) 90 MG tablet Take 1 tablet (90 mg total) by mouth 3 (three) times daily. 90 tablet 4   Flaxseed, Linseed, (FLAXSEED OIL) 1200 MG CAPS Take 2,400 mg by mouth at bedtime. (Patient not taking: Reported on 03/27/2021)     guaiFENesin-dextromethorphan (ROBITUSSIN DM) 100-10 MG/5ML syrup Take 5-10 mLs by mouth every 4 (four) hours as needed for cough. 473 mL 3   latanoprost (XALATAN) 0.005 % ophthalmic solution Place 1 drop into both eyes at bedtime.     lidocaine (XYLOCAINE) 2 % solution Patient: Mix 1part 2% viscous lidocaine, 1part H20. Swish & swallow 40mL of diluted mixture, 17min before meals and at bedtime, up to QID (Patient not taking: Reported on 03/28/2021) 200 mL 3   lidocaine-prilocaine (EMLA) cream Apply 1 application topically as needed. (Patient not taking: Reported on 03/27/2021) 30 g 0   magic mouthwash w/lidocaine SOLN Take 5 mLs by mouth 3 (three) times daily. (Patient not taking: Reported on 03/28/2021) 50 mL 0  metoprolol tartrate (LOPRESSOR) 25 MG tablet Take 1 tablet (25 mg total) by mouth 3 (three) times daily. 90 tablet 4   Multiple Vitamin (MULTIVITAMIN) tablet Take 1 tablet by mouth at bedtime. (Patient not taking: Reported on 03/27/2021)     niacin 500 MG CR capsule Take 500 mg by mouth at bedtime. (Patient not taking: Reported on 03/27/2021)     Nutritional Supplements (FEEDING SUPPLEMENT, OSMOLITE 1.5 CAL,) LIQD Osmolite 1.5 -  Give 7 cartons split over four feedings/day via PEG. Flush tube with 90 ml water before and after each feeding. Drink by mouth or give via tube additional 2 cups fluids/day. This provides 2485 kcal, 104.3 g protein, 2467 ml total water. Meets 100% estimated needs. 1659 mL    omeprazole (PRILOSEC) 40 MG capsule TAKE 1 CAPSULE BY MOUTH EVERY DAY (Patient not taking: Reported on 03/27/2021) 90 capsule 3   ondansetron (ZOFRAN ODT) 8 MG disintegrating tablet Take 1 tablet (8 mg total) by mouth every 8 (eight) hours as needed for nausea or vomiting. 20 tablet 0   prochlorperazine (COMPAZINE) 10 MG tablet Take 1 tablet (10 mg total) by mouth every 6 (six) hours as needed for nausea or vomiting. (Patient not taking: Reported on 03/27/2021) 30 tablet 0   sucralfate (CARAFATE) 1 g tablet Take 1 tablet (1 g total) by mouth 2 (two) times daily. 60 tablet 1   timolol (TIMOPTIC) 0.5 % ophthalmic solution Place 1 drop into both eyes daily.     Vitamin D, Cholecalciferol, 50 MCG (2000 UT) CAPS Take 2,000 Units by mouth at bedtime. (Patient not taking: Reported on 03/27/2021)     No current facility-administered medications for this visit.     Allergies: No Known Allergies   Physical Exam:    ECOG: 1   General appearance: Alert, awake without any distress. Head: Atraumatic without abnormalities Oropharynx: Without any thrush or ulcers. Eyes: No scleral icterus. Lymph nodes: No lymphadenopathy noted in the cervical, supraclavicular, or axillary nodes Heart: Irregular without murmurs or gallops.  No edema in his lower extremities. Lung: Clear to auscultation without any rhonchi, wheezes or dullness to percussion. Abdomin: Soft, nontender without any shifting dullness or ascites. Musculoskeletal: No clubbing or cyanosis. Neurological: No motor or sensory deficits. Skin: No rashes or lesions.        Lab Results: Lab Results  Component Value Date   WBC 1.4 (LL) 03/12/2021   HGB 9.4 (L)  03/12/2021   HCT 28.8 (L) 03/12/2021   MCV 96.0 03/12/2021   PLT 255 03/12/2021     Chemistry      Component Value Date/Time   NA 134 (L) 03/10/2021 0128   K 4.5 03/10/2021 0128   CL 97 (L) 03/10/2021 0128   CO2 30 03/10/2021 0128   BUN 23 03/10/2021 0128   CREATININE 0.75 03/10/2021 0128   CREATININE 1.06 03/05/2021 0815   CREATININE 1.10 02/13/2015 1545      Component Value Date/Time   CALCIUM 8.9 03/10/2021 0128   ALKPHOS 33 (L) 03/10/2021 0128   AST 18 03/10/2021 0128   AST 17 03/05/2021 0815   ALT 24 03/10/2021 0128   ALT 32 03/05/2021 0815   BILITOT 0.6 03/10/2021 0128   BILITOT 0.9 03/05/2021 0815          Impression and Plan:  Assessment and Plan:      71 year old with:  1. Squamous cell carcinoma diagnosed in May 2022.  He was found to left cervical adenopathy with unknown primary.   Is currently  on active surveillance after receiving definitive treatment with surgery followed by adjuvant radiation and weekly cisplatin.  At this time, I have recommended no additional treatments and will continue to be active surveillance.  He is scheduled to have repeat imaging studies in March 2023.  He is agreeable with this plan.     2.  Prostate cancer: We will continue to monitor his PSA as he is experiencing biochemical relapse.  Treatments will be deferred until he is recovered from his squamous cell carcinoma treatment.  3.  IV access: Port-A-Cath will remain in place and flushed periodically.  4.  Nutritional status: He continues to relies on feeding tube he is PEG.  5.  Myelosuppression: Related to chemotherapy and has recovered at this time.  Laboratory data from today reviewed and showed near normalization.  6.  Atrial fibrillation: Continues to follow with cardiology and continues to have occasional dizziness.   7.   Follow-up: He will return in 2 months for repeat evaluation   30  minutes were dedicated to this visit.  The time was spent on reviewing  laboratory data, disease status update and treatment choices for the future.    Zola Button, MD 1/27/20233:12 PM

## 2021-05-08 DIAGNOSIS — R9721 Rising PSA following treatment for malignant neoplasm of prostate: Secondary | ICD-10-CM | POA: Diagnosis not present

## 2021-05-08 DIAGNOSIS — C61 Malignant neoplasm of prostate: Secondary | ICD-10-CM | POA: Diagnosis not present

## 2021-05-08 DIAGNOSIS — N3942 Incontinence without sensory awareness: Secondary | ICD-10-CM | POA: Diagnosis not present

## 2021-05-10 ENCOUNTER — Ambulatory Visit (INDEPENDENT_AMBULATORY_CARE_PROVIDER_SITE_OTHER): Payer: Dental | Admitting: Dentistry

## 2021-05-10 ENCOUNTER — Encounter (HOSPITAL_COMMUNITY): Payer: Self-pay | Admitting: Dentistry

## 2021-05-10 ENCOUNTER — Other Ambulatory Visit: Payer: Self-pay

## 2021-05-10 VITALS — BP 121/68 | HR 80 | Temp 99.0°F | Wt 185.0 lb

## 2021-05-10 DIAGNOSIS — K117 Disturbances of salivary secretion: Secondary | ICD-10-CM | POA: Diagnosis not present

## 2021-05-10 DIAGNOSIS — Y842 Radiological procedure and radiotherapy as the cause of abnormal reaction of the patient, or of later complication, without mention of misadventure at the time of the procedure: Secondary | ICD-10-CM | POA: Diagnosis not present

## 2021-05-10 DIAGNOSIS — Z923 Personal history of irradiation: Secondary | ICD-10-CM | POA: Diagnosis not present

## 2021-05-10 DIAGNOSIS — R432 Parageusia: Secondary | ICD-10-CM

## 2021-05-10 DIAGNOSIS — R252 Cramp and spasm: Secondary | ICD-10-CM

## 2021-05-10 MED ORDER — SODIUM FLUORIDE 1.1 % DT CREA: TOPICAL_CREAM | DENTAL | 2 refills | Status: DC

## 2021-05-10 NOTE — Progress Notes (Signed)
Department of Dental Medicine    Service Date:   05/10/2021  Patient Name:  Nicholas Daniels Date of Birth:   [birth year] Medical Record Number: [id number]   LIMITED EXAM PLAN/RECOMMENDATIONS   ASSESSMENT: Xerostomia, dysgeusia, trismus, weight loss History of radiation therapy to the head & neck.  RECOMMENDATIONS: Brush after meals & at bedtime. Use fluoride at bedtime. Use trismus exercises as directed. Try using CLoSYS for dry mouth. Use warm salt water/baking soda rinses as needed. Take multiple sips of water as needed.  PLAN: Follow-up as needed in the hospital dental clinic. Rx:  Sodium fluoride 1.1% paste  Return to primary dentist  for routine dental care including cleanings/periodontal therapy and periodic exams. Call if any questions or concerns arise.       05/10/2021 CONSULT NOTE:    COVID-19 SCREENING:  The patient denies symptoms concerning for COVID-19 infection including fever, chills, cough, or newly developed shortness of breath.   HISTORY OF PRESENT ILLNESS: Nicholas Daniels is a 71 y.o. male who presents today for an oral examination after radiation therapy for throat cancer.  The patient has completed 30 of 30 radiation treatments from 01/29/21 to 03/12/21.  They have completed 4 chemotherapy treatments.  He had to stop prematurely due to new onset of A-fib.   REVIEW OF CHIEF COMPLAINTS: Dry mouth: Yes Hard to swallow: No Hurts to swallow:  No Taste changes:  Taste is slowly returning Sores in mouth:  No Limited opening:  Denies symptoms Weight loss:  Did lose some weight 185 lbs down from 200 lbs   AT- HOME ORAL HYGIENE REGIMEN: Brushing:  Reports brushing twice a day Flossing:  Not flossing.  Recommended trying a Waterpik to clean in between teeth if he is unable to floss. Rinsing:  Says he is using chlorhexidine rinses  Fluoride:  Prescription for sodium fluoride toothpaste written for patient today. Trismus exercises:  He admits to not doing  exercises as directed. 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   Increasing PSA level after treatment for prostate cancer 11/22/2020   Macular retinoschisis, left 11/08/2020   Left epiretinal membrane 10/17/2019   Right epiretinal membrane 10/17/2019   Early stage nonexudative age-related macular degeneration of both eyes 10/17/2019   Drug-induced gynecomastia 08/14/2015   Mediastinal mass 08/09/2014   History of prostate cancer 08/09/2014   Hypertension 08/09/2014   Past Medical History:  Diagnosis Date   Atrial fibrillation ([place])    Cataract    removed bilateraly   Coronary artery calcification seen on CT scan    Cough    Essential hypertension    Glaucoma    Head and neck cancer ([facility])    Hypertension    PFO (patent foramen ovale)    PFO noted on coronary CT 09/05/14   Prostate cancer ([place]) 2009   Past Surgical History:  Procedure Laterality Date   CATARACT EXTRACTION     bilateral and a wrinkle on retina on the left    CHOLECYSTECTOMY     COLONOSCOPY     DIRECT LARYNGOSCOPY N/A 12/17/2020   Procedure: DIRECT LARYNGOSCOPY WITH BIOPSIES;  Surgeon: Izora Gala, MD;  Location: [facility];  Service: ENT;  Laterality: N/A;   ESOPHAGOSCOPY N/A 12/17/2020   Procedure: ESOPHAGOSCOPY;  Surgeon: Izora Gala, MD;  Location: [facility];  Service: ENT;  Laterality: N/A;   IR GASTROSTOMY TUBE MOD SED  01/28/2021   IR IMAGING GUIDED PORT INSERTION  01/28/2021   PROSTATE SURGERY  2009   radiation treatment     RADICAL NECK DISSECTION Left 12/17/2020   Procedure: LEFT NECK DISSECTION;  Surgeon: Izora Gala, MD;  Location: [facility];  Service: ENT;   Laterality: Left;   RESECTION OF MEDIASTINAL MASS N/A 02/26/2015   Procedure: RESECTION OF MEDIASTINAL MASS;  Surgeon: Ivin Poot, MD;  Location: [facility];  Service: Thoracic;  Laterality: N/A;   STERNOTOMY N/A 02/26/2015   Procedure: STERNOTOMY;  Surgeon: Ivin Poot, MD;  Location: [facility];  Service: Thoracic;  Laterality: N/A;   UPPER GASTROINTESTINAL ENDOSCOPY     Current Outpatient Medications  Medication Sig Dispense Refill   apixaban (ELIQUIS) 5 MG TABS tablet Place 1 tablet (5 mg total) into feeding tube 2 (two) times daily. 60 tablet 4   calcium-vitamin D (CALCIUM 500/D) 500-200 MG-UNIT tablet Take 1 tablet by mouth at bedtime. (Patient not taking: Reported on 03/27/2021)     chlorhexidine (PERIDEX) 0.12 % solution Use as directed 15 mLs in the mouth or throat 4 (four) times daily. 120 mL 0   diltiazem (CARDIZEM) 90 MG tablet Take 1 tablet (90 mg total) by mouth 3 (three) times daily. 90 tablet 4   Flaxseed, Linseed, (FLAXSEED OIL) 1200 MG CAPS Take 2,400 mg by mouth at bedtime. (Patient not taking: Reported on 03/27/2021)     guaiFENesin-dextromethorphan (ROBITUSSIN DM) 100-10 MG/5ML syrup Take 5-10 mLs by mouth every 4 (four) hours as needed for cough. 473 mL 3   latanoprost (XALATAN) 0.005 % ophthalmic solution Place 1 drop into both eyes at bedtime.     lidocaine (XYLOCAINE) 2 % solution Patient: Mix 1part 2% viscous lidocaine, 1part H20. Swish & swallow 79mL of diluted mixture, 38min before meals and at bedtime, up to QID (Patient not taking: Reported on 03/28/2021) 200 mL 3   lidocaine-prilocaine (EMLA) cream Apply 1 application topically as needed. (Patient not taking: Reported on 03/27/2021) 30 g 0   magic mouthwash w/lidocaine SOLN Take 5 mLs by mouth 3 (three) times daily. (Patient not taking: Reported on 03/28/2021) 50 mL 0   metoprolol tartrate (LOPRESSOR) 25 MG tablet Take 1 tablet (25 mg total) by mouth 3 (three) times daily. 90 tablet 4   Multiple Vitamin  (MULTIVITAMIN) tablet Take 1 tablet by mouth at bedtime. (Patient not taking: Reported on 03/27/2021)     niacin 500 MG CR capsule Take 500 mg by mouth at bedtime. (Patient not taking: Reported on 03/27/2021)     Nutritional Supplements (FEEDING SUPPLEMENT, OSMOLITE 1.5 CAL,) LIQD Osmolite 1.5 - Give 7 cartons split over four feedings/day via PEG. Flush tube with 90 ml water before and after each feeding. Drink by mouth or give via tube additional 2 cups fluids/day. This provides 2485 kcal, 104.3 g protein, 2467 ml total water. Meets 100% estimated needs. 1659 mL    omeprazole (PRILOSEC) 40 MG capsule TAKE 1 CAPSULE BY MOUTH EVERY DAY (Patient not taking: Reported on 03/27/2021) 90 capsule 3   ondansetron (ZOFRAN ODT) 8 MG disintegrating tablet Take 1 tablet (8 mg total) by mouth every 8 (eight) hours as needed for nausea or vomiting. 20 tablet 0   prochlorperazine (COMPAZINE) 10 MG tablet Take 1  tablet (10 mg total) by mouth every 6 (six) hours as needed for nausea or vomiting. (Patient not taking: Reported on 03/27/2021) 30 tablet 0   sucralfate (CARAFATE) 1 g tablet Take 1 tablet (1 g total) by mouth 2 (two) times daily. 60 tablet 1   timolol (TIMOPTIC) 0.5 % ophthalmic solution Place 1 drop into both eyes daily.     Vitamin D, Cholecalciferol, 50 MCG (2000 UT) CAPS Take 2,000 Units by mouth at bedtime. (Patient not taking: Reported on 03/27/2021)     No current facility-administered medications for this visit.   No Known Allergies   VITALS: BP 121/68 (BP Location: Right Arm, Patient Position: Sitting, Cuff Size: Normal)    Pulse 80    Temp 99 F (37.2 C) (Oral)    Wt 185 lb (83.9 kg)    BMI 28.98 kg/m    DENTAL EXAM: Oral hygiene (plaque):  No Mucositis:  No Description of saliva:  Thick, viscous saliva Exposed bone:  No Other watched areas:  No   ASSESSMENT/DIAGNOSES: Patient with history of radiation therapy to the head and neck region.  Xerostomia:  Clinical xerostomia.  He  does notice this pretty much all of the time and says that it makes trying to eat more things difficult.  Samples for over-the-counter brand CLoSYS were given to the patient to try using for symptom relief.  Dysgeusia:  He still has limited taste to most things.  He says the tip of his tongue has the most normal taste when he does eat something, but once he starts to chew and swallow he loses flavoring. Weight Loss:  He lost about 15 lbs since before radiation.  He is still struggling to eat well by mouth due to taste and other side effects. Trismus:  He lost about 6 mm of his MIO since before radiation.  He did not do his exercises.  Encouraged the patient to resume these or start to do them as directed to prevent more loss of his jaw opening.   PLAN AND RECOMMENDATIONS: Brush after meals and at bedtime.  Use fluoride at bedtime. Use trismus exercises as directed. Use CLoSYS for dry mouth.  Use salt water/baking soda rinses as needed.  Stop chlorhexidine rinses. Take multiple sips of water as needed.  Return to regular dentist for routine dental care including cleanings, periodic exams and other indicated dental care. Follow-up as needed in the hospital clinic.   Call if any problems or concerns arise.  All questions and concerns were invited and addressed.  The patient tolerated today's visit well and departed in stable condition.  Charlaine Dalton, D.M.D.

## 2021-05-10 NOTE — Patient Instructions (Signed)
[facility] Department of Dental Medicine Dr. Atiba Kimberlin B. Jaylena Holloway, D.M.D. Phone: [phone] Fax: [phone]      It was a pleasure seeing you today!  Please refer to the information below regarding your dental visit with us.  Please do not hesitate to give us a call if any questions or concerns come up after you leave.     Thank you for letting us provide care for you.  If there is anything we can do for you, please let us know.    RECOMMENDATIONS: Brush after meals and at bedtime.  Floss once a day, and use fluoride at bedtime. Use trismus exercises as directed below. Use CLOSYs for dry mouth, and salt water/baking soda rinses to help with any mouth sores. Take multiple sips of water as needed.  Stay hydrated. Return to your regular dentist for routine dental care including cleanings and periodic exams.  If you do not have a regular dentist, it is important to establish care at an outside dental office of your choice.  Call us if any problems or concerns arise.    TRISMUS: Trismus is a condition where the jaw does not allow the mouth to open as wide as it usually does.  This can happen almost suddenly, or in other cases the process is so slow, it is hard to notice it-until it is too far along.  When the jaw joints and/or muscles have been exposed to radiation treatments, the onset of trismus is very slow.  This is because the muscles are losing their stretching ability over a long period of time, as long as 2 YEARS after the end of radiation.  It is therefore important to exercise these muscles and joints.  Trismus exercises: Use the stack of tongue depressors measuring the same or a little less than your maximum opening that was recorded at your first dental visit. Place the stack in your mouth, supporting the other end with your hand. Allow 30 seconds for muscle stretching. Rest for a few seconds. Repeat 3-5 times. For all radiation patients,  this exercise is recommended in the mornings and evenings unless otherwise instructed. The exercises should be done for a period of 2 YEARS after the end of radiation. Maximum jaw opening should be checked routinely on recall dental visits by your general dentist. Report any changes, soreness, or difficulties encountered when doing the exercises to your dentist.    Questions?  Call our office during office hours at [phone].  

## 2021-05-20 ENCOUNTER — Other Ambulatory Visit: Payer: Self-pay

## 2021-05-20 ENCOUNTER — Ambulatory Visit (INDEPENDENT_AMBULATORY_CARE_PROVIDER_SITE_OTHER): Payer: PPO | Admitting: Ophthalmology

## 2021-05-20 ENCOUNTER — Encounter (INDEPENDENT_AMBULATORY_CARE_PROVIDER_SITE_OTHER): Payer: Self-pay | Admitting: Ophthalmology

## 2021-05-20 DIAGNOSIS — H33102 Unspecified retinoschisis, left eye: Secondary | ICD-10-CM

## 2021-05-20 DIAGNOSIS — H35372 Puckering of macula, left eye: Secondary | ICD-10-CM

## 2021-05-20 DIAGNOSIS — H35371 Puckering of macula, right eye: Secondary | ICD-10-CM

## 2021-05-20 DIAGNOSIS — H353131 Nonexudative age-related macular degeneration, bilateral, early dry stage: Secondary | ICD-10-CM

## 2021-05-20 NOTE — Assessment & Plan Note (Signed)
No real changes over the last year over year, with preserved acuity left eye from epiretinal membrane.  Patient requested to schedule a follow-up appointment in the coming months

## 2021-05-20 NOTE — Progress Notes (Signed)
05/20/2021     CHIEF COMPLAINT Patient presents for  Chief Complaint  Patient presents with   Retina Follow Up      HISTORY OF PRESENT ILLNESS: Nicholas Daniels is a 71 y.o. male who presents to the clinic today for:   HPI     Retina Follow Up           Diagnosis: Other (ERM)   Laterality: left eye   Onset: 4 months ago   Severity: mild   Course: stable         Comments   4 mos fu OU oct. Patient states vision is stable and unchanged since last visit. Denies any new floaters or FOL. Pt states his medications have changed since  Pt states he uses latanoprost QAM OU, timolol QHS OU. Pt states he is not taking Calcium, Chlorhexidine, Flaxseed Oil, Robitussin, Lidocaine, magic mouthwash with lidocaine, Multivitamins, Niacin, Zofran PRN but has not used, Compazine (prochlorperazine), Vitamin D3 that is still on the medication list. Pt states he has been having dizzy spells for about 4 weeks now, pt states the cardiologist thinks it is due to the medicines he is on.      Last edited by Laurin Coder on 05/20/2021  8:49 AM.      Referring physician: [name], MD [place],  [place] [address]  HISTORICAL INFORMATION:   Selected notes from the MEDICAL RECORD NUMBER       CURRENT MEDICATIONS: Current Outpatient Medications (Ophthalmic Drugs)  Medication Sig   latanoprost (XALATAN) 0.005 % ophthalmic solution Place 1 drop into both eyes at bedtime.   timolol (TIMOPTIC) 0.5 % ophthalmic solution Place 1 drop into both eyes daily.   No current facility-administered medications for this visit. (Ophthalmic Drugs)   Current Outpatient Medications (Other)  Medication Sig   apixaban (ELIQUIS) 5 MG TABS tablet Place 1 tablet (5 mg total) into feeding tube 2 (two) times daily.   calcium-vitamin D (CALCIUM 500/D) 500-200 MG-UNIT tablet Take 1 tablet by mouth at bedtime. (Patient not taking: Reported on 03/27/2021)   chlorhexidine (PERIDEX) 0.12 %  solution Use as directed 15 mLs in the mouth or throat 4 (four) times daily.   diltiazem (CARDIZEM) 90 MG tablet Take 1 tablet (90 mg total) by mouth 3 (three) times daily.   Flaxseed, Linseed, (FLAXSEED OIL) 1200 MG CAPS Take 2,400 mg by mouth at bedtime. (Patient not taking: Reported on 03/27/2021)   guaiFENesin-dextromethorphan (ROBITUSSIN DM) 100-10 MG/5ML syrup Take 5-10 mLs by mouth every 4 (four) hours as needed for cough.   lidocaine (XYLOCAINE) 2 % solution Patient: Mix 1part 2% viscous lidocaine, 1part H20. Swish & swallow 69mL of diluted mixture, 75min before meals and at bedtime, up to QID (Patient not taking: Reported on 03/28/2021)   lidocaine-prilocaine (EMLA) cream Apply 1 application topically as needed. (Patient not taking: Reported on 03/27/2021)   magic mouthwash w/lidocaine SOLN Take 5 mLs by mouth 3 (three) times daily. (Patient not taking: Reported on 03/28/2021)   metoprolol tartrate (LOPRESSOR) 25 MG tablet Take 1 tablet (25 mg total) by mouth 3 (three) times daily.   Multiple Vitamin (MULTIVITAMIN) tablet Take 1 tablet by mouth at bedtime. (Patient not taking: Reported on 03/27/2021)   niacin 500 MG CR capsule Take 500 mg by mouth at bedtime. (Patient not taking: Reported on 03/27/2021)   Nutritional Supplements (FEEDING SUPPLEMENT, OSMOLITE 1.5 CAL,) LIQD Osmolite 1.5 - Give 7 cartons split over four feedings/day via PEG. Flush tube  with 90 ml water before and after each feeding. Drink by mouth or give via tube additional 2 cups fluids/day. This provides 2485 kcal, 104.3 g protein, 2467 ml total water. Meets 100% estimated needs.   omeprazole (PRILOSEC) 40 MG capsule TAKE 1 CAPSULE BY MOUTH EVERY DAY (Patient not taking: Reported on 03/27/2021)   ondansetron (ZOFRAN ODT) 8 MG disintegrating tablet Take 1 tablet (8 mg total) by mouth every 8 (eight) hours as needed for nausea or vomiting.   prochlorperazine (COMPAZINE) 10 MG tablet Take 1 tablet (10 mg total) by mouth every 6  (six) hours as needed for nausea or vomiting. (Patient not taking: Reported on 03/27/2021)   sodium fluoride (SODIUM FLUORIDE 5000 PLUS) 1.1 % CREA dental cream Place 1 pea-size drop onto toothbrush and brush teeth.  Use once a day at bedtime.  Spit out excess toothpaste, but DO NOT rinse with water, eat or drink for at least 30 minutes after use.   sucralfate (CARAFATE) 1 g tablet Take 1 tablet (1 g total) by mouth 2 (two) times daily.   Vitamin D, Cholecalciferol, 50 MCG (2000 UT) CAPS Take 2,000 Units by mouth at bedtime. (Patient not taking: Reported on 03/27/2021)   No current facility-administered medications for this visit. (Other)      REVIEW OF SYSTEMS:    ALLERGIES No Known Allergies  PAST MEDICAL HISTORY Past Medical History:  Diagnosis Date   Atrial fibrillation ([place])    Cataract    removed bilateraly   Coronary artery calcification seen on CT scan    Cough    Essential hypertension    Glaucoma    Head and neck cancer (HCC)    Hypertension    PFO (patent foramen ovale)    PFO noted on coronary CT 09/05/14   Prostate cancer ([facility]) 2009   Past Surgical History:  Procedure Laterality Date   CATARACT EXTRACTION     bilateral and a wrinkle on retina on the left    CHOLECYSTECTOMY     COLONOSCOPY     DIRECT LARYNGOSCOPY N/A 12/17/2020   Procedure: DIRECT LARYNGOSCOPY WITH BIOPSIES;  Surgeon: Izora Gala, MD;  Location: [facility];  Service: ENT;  Laterality: N/A;   ESOPHAGOSCOPY N/A 12/17/2020   Procedure: ESOPHAGOSCOPY;  Surgeon: Izora Gala, MD;  Location: [facility];  Service: ENT;  Laterality: N/A;   IR GASTROSTOMY TUBE MOD SED  01/28/2021   IR IMAGING GUIDED PORT INSERTION  01/28/2021   PROSTATE SURGERY  2009   radiation treatment     RADICAL NECK DISSECTION Left 12/17/2020   Procedure: LEFT NECK DISSECTION;  Surgeon: Izora Gala, MD;  Location: [facility];  Service: ENT;  Laterality: Left;   RESECTION OF MEDIASTINAL MASS N/A 02/26/2015   Procedure: RESECTION OF  MEDIASTINAL MASS;  Surgeon: Ivin Poot, MD;  Location: [facility];  Service: Thoracic;  Laterality: N/A;   STERNOTOMY N/A 02/26/2015   Procedure: STERNOTOMY;  Surgeon: Ivin Poot, MD;  Location: [facility];  Service: Thoracic;  Laterality: N/A;   UPPER GASTROINTESTINAL ENDOSCOPY      FAMILY HISTORY Family History  Problem Relation Age of Onset   Heart disease Brother    COPD Brother    Cancer Brother        liver   Liver cancer Brother    Heart disease Mother        MI   COPD Mother    Pneumonia Mother    Diabetes Mother    COPD Father    Colon  polyps Other    Esophageal cancer Neg Hx    Rectal cancer Neg Hx    Stomach cancer Neg Hx     SOCIAL HISTORY Social History   Tobacco Use   Smoking status: Never    Passive exposure: Past   Smokeless tobacco: Never  Vaping Use   Vaping Use: Never used  Substance Use Topics   Alcohol use: No   Drug use: No         OPHTHALMIC EXAM:  Base Eye Exam     Visual Acuity (ETDRS)       Right Left   Dist cc 20/20 20/20 -1    Correction: Glasses         Tonometry (Tonopen, 8:51 AM)       Right Left   Pressure 17 14         Pupils       Pupils Dark Light APD   Right PERRL 4 3 None   Left PERRL 3 2 None         Extraocular Movement       Right Left    Full Full         Neuro/Psych     Oriented x3: Yes   Mood/Affect: Normal         Dilation     Both eyes: 1.0% Mydriacyl, 2.5% Phenylephrine @ 8:51 AM           Slit Lamp and Fundus Exam     External Exam       Right Left   External Normal Normal         Slit Lamp Exam       Right Left   Lids/Lashes Normal Normal   Conjunctiva/Sclera White and quiet White and quiet, no signs of conjunctivitis   Cornea Clear Fine SPK noted on the ocular surface of the left eye.     Anterior Chamber Deep and quiet Deep and quiet   Iris Round and reactive Round and reactive   Lens Posterior chamber intraocular lens Posterior chamber intraocular  lens   Anterior Vitreous Normal Normal         Fundus Exam       Right Left   Posterior Vitreous Normal Normal   Disc Normal Normal   C/D Ratio 0.25 0.3   Macula Normal, none foveal epiretinal membrane mild Epiretinal membrane, severe topographic distortion with pseudo CME   Vessels Normal Normal   Periphery Normal Normal            IMAGING AND PROCEDURES  Imaging and Procedures for 05/20/21  OCT, Retina - OU - Both Eyes       Right Eye Scan locations included subfoveal. Central Foveal Thickness: 285. Progression has been stable. Findings include epiretinal membrane.   Left Eye Quality was good. Scan locations included subfoveal. Central Foveal Thickness: 468. Progression has been stable. Findings include epiretinal membrane.   Notes Minor epiretinal membrane with no topographic distortion OD, nasal and superior   OS with overall stable appearance and retinal thickening of the left eye from epiretinal membrane, what is new and now slightly progressed however is a small outer retinal nodule of subretinal debris, this could be a change in the photoreceptor layer.  With increased thickening, consider surgical undertaken left eye once patient's systemic issues, cancer therapy are completed             ASSESSMENT/PLAN:  Right epiretinal membrane Minor seen clinically but no significant change on OCT  and no topographic distortion to the macula OD, will continue observe OD  Macular retinoschisis, left No real changes over the last year over year, with preserved acuity left eye from epiretinal membrane.  Patient requested to schedule a follow-up appointment in the coming months  Left epiretinal membrane Severe but no impact on acuity.  May 1 day need vitrectomy membrane peel  confirmed by vitreous base hemorrhage on clinical examination in the office     ICD-10-CM   1. Early stage nonexudative age-related macular degeneration of both eyes  H35.3131 OCT, Retina - OU  - Both Eyes    2. Right epiretinal membrane  H35.371     3. Macular retinoschisis, left  H33.102     4. Left epiretinal membrane  H35.372       1.  Examination completed but full extent of findings and discussion were cut short due to Dr. Zadie Rhine being called away from the office due to a family medical emergency  2.  3.  Ophthalmic Meds Ordered this visit:  No orders of the defined types were placed in this encounter.      Return in about 4 months (around 09/17/2021) for DILATE OU, OCT.  There are no Patient Instructions on file for this visit.   Explained the diagnoses, plan, and follow up with the patient and they expressed understanding.  Patient expressed understanding of the importance of proper follow up care.   Clent Demark Idalie Canto M.D. Diseases & Surgery of the Retina and Vitreous [facility] [place] 05/20/21     Abbreviations: M myopia (nearsighted); A astigmatism; H hyperopia (farsighted); P presbyopia; Mrx spectacle prescription;  CTL contact lenses; OD right eye; OS left eye; OU both eyes  XT exotropia; ET esotropia; PEK punctate epithelial keratitis; PEE punctate epithelial erosions; DES dry eye syndrome; MGD meibomian gland dysfunction; ATs artificial tears; PFAT's preservative free artificial tears; [place] nuclear sclerotic cataract; PSC posterior subcapsular cataract; ERM epi-retinal membrane; PVD posterior vitreous detachment; RD retinal detachment; DM diabetes mellitus; DR diabetic retinopathy; NPDR non-proliferative diabetic retinopathy; PDR proliferative diabetic retinopathy; CSME clinically significant macular edema; DME diabetic macular edema; dbh dot blot hemorrhages; CWS cotton wool spot; POAG primary open angle glaucoma; C/D cup-to-disc ratio; HVF humphrey visual field; GVF goldmann visual field; OCT optical coherence tomography; IOP intraocular pressure; BRVO Branch retinal vein occlusion; CRVO central retinal vein occlusion; CRAO central retinal artery  occlusion; BRAO branch retinal artery occlusion; RT retinal tear; SB scleral buckle; PPV pars plana vitrectomy; VH Vitreous hemorrhage; PRP panretinal laser photocoagulation; IVK intravitreal kenalog; VMT vitreomacular traction; MH Macular hole;  NVD neovascularization of the disc; NVE neovascularization elsewhere; AREDS age related eye disease study; ARMD age related macular degeneration; POAG primary open angle glaucoma; EBMD epithelial/anterior basement membrane dystrophy; ACIOL anterior chamber intraocular lens; IOL intraocular lens; PCIOL posterior chamber intraocular lens; Phaco/IOL phacoemulsification with intraocular lens placement; [place] photorefractive keratectomy; LASIK laser assisted in situ keratomileusis; HTN hypertension; DM diabetes mellitus; COPD chronic obstructive pulmonary disease

## 2021-05-20 NOTE — Assessment & Plan Note (Signed)
Severe but no impact on acuity.  May 1 day need vitrectomy membrane peel  confirmed by vitreous base hemorrhage on clinical examination in the office

## 2021-05-20 NOTE — Assessment & Plan Note (Signed)
Minor seen clinically but no significant change on OCT and no topographic distortion to the macula OD, will continue observe OD

## 2021-05-21 ENCOUNTER — Ambulatory Visit: Payer: PPO | Attending: Radiation Oncology

## 2021-05-21 ENCOUNTER — Other Ambulatory Visit: Payer: Self-pay

## 2021-05-21 DIAGNOSIS — R1310 Dysphagia, unspecified: Secondary | ICD-10-CM | POA: Diagnosis not present

## 2021-05-21 NOTE — Therapy (Signed)
[facility] [place], [place], [place], [address] Phone: [phone]   Fax:  [phone]  Speech Language Pathology Treatment  Patient Details  Name: Nicholas Daniels MRN: [id number] Date of Birth: December 19, 1950 Referring Provider (SLP): Eppie Gibson, MD   Encounter Date: 05/21/2021   End of Session - 05/21/21 1424     Visit Number 4    Number of Visits 6    Date for SLP Re-Evaluation 07/21/21    SLP Start Time 35    SLP Stop Time  1350    SLP Time Calculation (min) 32 min    Activity Tolerance Patient tolerated treatment well             Past Medical History:  Diagnosis Date   Atrial fibrillation ([facility])    Cataract    removed bilateraly   Coronary artery calcification seen on CT scan    Cough    Essential hypertension    Glaucoma    Head and neck cancer ([facility])    Hypertension    PFO (patent foramen ovale)    PFO noted on coronary CT 09/05/14   Prostate cancer ([facility]) 2009    Past Surgical History:  Procedure Laterality Date   CATARACT EXTRACTION     bilateral and a wrinkle on retina on the left    CHOLECYSTECTOMY     COLONOSCOPY     DIRECT LARYNGOSCOPY N/A 12/17/2020   Procedure: DIRECT LARYNGOSCOPY WITH BIOPSIES;  Surgeon: Izora Gala, MD;  Location: [facility];  Service: ENT;  Laterality: N/A;   ESOPHAGOSCOPY N/A 12/17/2020   Procedure: ESOPHAGOSCOPY;  Surgeon: Izora Gala, MD;  Location: [facility];  Service: ENT;  Laterality: N/A;   IR GASTROSTOMY TUBE MOD SED  01/28/2021   IR IMAGING GUIDED PORT INSERTION  01/28/2021   PROSTATE SURGERY  2009   radiation treatment     RADICAL NECK DISSECTION Left 12/17/2020   Procedure: LEFT NECK DISSECTION;  Surgeon: Izora Gala, MD;  Location: [facility];  Service: ENT;  Laterality: Left;   RESECTION OF MEDIASTINAL MASS N/A 02/26/2015   Procedure: RESECTION OF MEDIASTINAL MASS;  Surgeon: Ivin Poot, MD;  Location: [facility];  Service: Thoracic;  Laterality: N/A;   STERNOTOMY N/A  02/26/2015   Procedure: STERNOTOMY;  Surgeon: Ivin Poot, MD;  Location: [facility];  Service: Thoracic;  Laterality: N/A;   UPPER GASTROINTESTINAL ENDOSCOPY      There were no vitals filed for this visit.   Subjective Assessment - 05/21/21 1326     Subjective Pt is counting calories each day - 2000/day.    Currently in Pain? No/denies                   ADULT SLP TREATMENT - 05/21/21 1327       General Information   Behavior/Cognition Alert;Cooperative;Pleasant mood      Treatment Provided   Treatment provided Dysphagia      Dysphagia Treatment   Temperature Spikes Noted No    Respiratory Status Room air    Treatment Methods Skilled observation;Therapeutic exercise;Compensation strategy training;Patient/caregiver education    Patient observed directly with PO's Yes    Type of PO's observed Dysphagia 3 (soft);Thin liquids    Oral Phase Signs & Symptoms Left pocketing   minimal-pt cont to complain of lt facial numbness and weakness   Pharyngeal Phase Signs & Symptoms Other (comment)   none noted   Other treatment/comments Cont to complain of xerostomia; still having  two cartons Osmolite via PEG if necessary for calories.Pt with suboptimal completion of HEP - states once a week. Pt able to provide rationale for completing HEP independently and SLP reiterated this fact. Julian tells SLP he is maintaining his weight "at around 183 pounds."      Assessment / Recommendations / [place] with current plan of care      Dysphagia Recommendations   Diet recommendations Regular;Thin liquid    Liquids provided via Cup    Medication Administration Other (Comment)   as tolerated     Progression Toward Goals   Progression toward goals Progressing toward goals   however not adhering to recommended frequency of HEP             SLP Education - 05/21/21 1424     Education Details rationale for HEP completion, HEP needs to be completed 6/7 days a week at least twice  a day    Person(s) Educated Patient    Methods Explanation    Comprehension Verbalized understanding            Short Term Goals 05-21-21        SLP SHORT TERM GOAL #1      Title pt will complete HEP with rare min A    Time      Period --   vists, for all STGs    Status Achieved           SLP SHORT TERM GOAL #2    Title pt will tell SLP why pt is completing HEP with modified independence    Time      Status Achieved           SLP SHORT TERM GOAL #3    Title pt will describe 3 overt s/s aspiration PNA with modified independence    Time      Status Achieved           SLP SHORT TERM GOAL #4    Title pt will tell SLP how a food journal could hasten return to a more normalized diet    Time      Status Deferred - pt beginning to eat all consistencies now                     Long Term Goals 05-21-21                 SLP LONG TERM GOAL #1    Title pt will complete HEP with modified independence over three visits    Time 1   03-26-21, 05-21-21    Period --   visits, for all LTGs    Status Revised           SLP LONG TERM GOAL #2    Title pt will describe how to modify HEP over time, and the timeline associated with reduction in HEP frequency with modified independence over two sessions    Time 2    Status Ongoing       Plan - 05-21-21       Clinical Impression Statement At this time pt swallowing is deemed WNL/WFL with dys III/regular diet and thin liquids. SLP reviewed pt's individualized HEP for dysphagia and pt completed each exercise on their own with independence. Pt is not completing HEP to recommended frequency There are no overt s/s aspiration reported by pt at this time. Data indicate that pt's swallow ability will likely decrease over the course of radiation/chemotherapy  and could very well decline over time following conclusion of their radiation therapy due to muscle disuse atrophy and/or muscle fibrosis.  Pt will cont to need to be seen by SLP every eight  weeks, in order to assess safety of PO intake, assess the need for recommending any objective swallow assessment, and ensuring pt correctly completes the individualized HEP. Today pt's HEP performed independently and pt was safe with POs.    Speech Therapy Frequency --   once approx every eight weeks    Duration --   90 days for this reporting period; overall, approx 7 visits    Treatment/Interventions Aspiration precaution training;Pharyngeal strengthening exercises;Diet toleration management by SLP;Trials of upgraded texture/liquids;Patient/family education;SLP instruction and feedback;Compensatory techniques    Potential to Achieve Goals Good    SLP Home Exercise Plan provided    Consulted and Agree with Plan of Care Patient          Patient will benefit from skilled therapeutic intervention in order to improve the following deficits and impairments:   Dysphagia, unspecified type    Problem List Patient Active Problem List   Diagnosis Date Noted   History of radiation to head and neck region 05/10/2021   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   Trismus 05/10/2021   Dysgeusia 05/10/2021   Atrial fibrillation with RVR ([facility]) 03/05/2021   Port-A-Cath in place 02/05/2021   Head and neck cancer ([facility]) 01/01/2021   SPK (superficial punctate keratitis), left 12/13/2020   Dry eyes, bilateral 12/13/2020   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 [place], [place], [address] Phone: [phone]   Fax:  [phone]   Name: DORRELL MITCHELTREE MRN: [id number] Date of Birth: [birth year]

## 2021-05-29 DIAGNOSIS — D1801 Hemangioma of skin and subcutaneous tissue: Secondary | ICD-10-CM | POA: Diagnosis not present

## 2021-05-29 DIAGNOSIS — D2261 Melanocytic nevi of right upper limb, including shoulder: Secondary | ICD-10-CM | POA: Diagnosis not present

## 2021-05-29 DIAGNOSIS — L918 Other hypertrophic disorders of the skin: Secondary | ICD-10-CM | POA: Diagnosis not present

## 2021-05-29 DIAGNOSIS — D2262 Melanocytic nevi of left upper limb, including shoulder: Secondary | ICD-10-CM | POA: Diagnosis not present

## 2021-05-29 DIAGNOSIS — D2272 Melanocytic nevi of left lower limb, including hip: Secondary | ICD-10-CM | POA: Diagnosis not present

## 2021-05-29 DIAGNOSIS — Z85828 Personal history of other malignant neoplasm of skin: Secondary | ICD-10-CM | POA: Diagnosis not present

## 2021-05-29 DIAGNOSIS — D225 Melanocytic nevi of trunk: Secondary | ICD-10-CM | POA: Diagnosis not present

## 2021-05-29 DIAGNOSIS — D485 Neoplasm of uncertain behavior of skin: Secondary | ICD-10-CM | POA: Diagnosis not present

## 2021-05-29 DIAGNOSIS — L821 Other seborrheic keratosis: Secondary | ICD-10-CM | POA: Diagnosis not present

## 2021-05-29 DIAGNOSIS — L570 Actinic keratosis: Secondary | ICD-10-CM | POA: Diagnosis not present

## 2021-06-14 ENCOUNTER — Inpatient Hospital Stay: Payer: PPO | Attending: Oncology

## 2021-06-14 ENCOUNTER — Other Ambulatory Visit: Payer: Self-pay

## 2021-06-14 ENCOUNTER — Ambulatory Visit (HOSPITAL_COMMUNITY)
Admission: RE | Admit: 2021-06-14 | Discharge: 2021-06-14 | Disposition: A | Discharge status: Home / Self Care | Payer: PPO | Source: Ambulatory Visit | Attending: Radiation Oncology | Admitting: Radiation Oncology

## 2021-06-14 DIAGNOSIS — Z8546 Personal history of malignant neoplasm of prostate: Secondary | ICD-10-CM | POA: Insufficient documentation

## 2021-06-14 DIAGNOSIS — I2510 Atherosclerotic heart disease of native coronary artery without angina pectoris: Secondary | ICD-10-CM | POA: Insufficient documentation

## 2021-06-14 DIAGNOSIS — C760 Malignant neoplasm of head, face and neck: Secondary | ICD-10-CM | POA: Insufficient documentation

## 2021-06-14 DIAGNOSIS — I700 Atherosclerosis of aorta: Secondary | ICD-10-CM | POA: Insufficient documentation

## 2021-06-14 DIAGNOSIS — Z452 Encounter for adjustment and management of vascular access device: Secondary | ICD-10-CM | POA: Insufficient documentation

## 2021-06-14 DIAGNOSIS — Z95828 Presence of other vascular implants and grafts: Secondary | ICD-10-CM

## 2021-06-14 DIAGNOSIS — R590 Localized enlarged lymph nodes: Secondary | ICD-10-CM | POA: Diagnosis not present

## 2021-06-14 DIAGNOSIS — C770 Secondary and unspecified malignant neoplasm of lymph nodes of head, face and neck: Secondary | ICD-10-CM | POA: Insufficient documentation

## 2021-06-14 DIAGNOSIS — C801 Malignant (primary) neoplasm, unspecified: Secondary | ICD-10-CM | POA: Insufficient documentation

## 2021-06-14 LAB — CBC WITH DIFFERENTIAL (CANCER CENTER ONLY)
Abs Immature Granulocytes: 0.01 10*3/uL (ref 0.00–0.07)
Basophils Absolute: 0 10*3/uL (ref 0.0–0.1)
Basophils Relative: 0 %
Eosinophils Absolute: 0.1 10*3/uL (ref 0.0–0.5)
Eosinophils Relative: 3 %
HCT: 39.1 % (ref 39.0–52.0)
Hemoglobin: 13.3 g/dL (ref 13.0–17.0)
Immature Granulocytes: 0 %
Lymphocytes Relative: 23 %
Lymphs Abs: 0.9 10*3/uL (ref 0.7–4.0)
MCH: 32.6 pg (ref 26.0–34.0)
MCHC: 34 g/dL (ref 30.0–36.0)
MCV: 95.8 fL (ref 80.0–100.0)
Monocytes Absolute: 0.5 10*3/uL (ref 0.1–1.0)
Monocytes Relative: 13 %
Neutro Abs: 2.4 10*3/uL (ref 1.7–7.7)
Neutrophils Relative %: 61 %
Platelet Count: 235 10*3/uL (ref 150–400)
RBC: 4.08 MIL/uL — ABNORMAL LOW (ref 4.22–5.81)
RDW: 11.9 % (ref 11.5–15.5)
WBC Count: 3.9 10*3/uL — ABNORMAL LOW (ref 4.0–10.5)
nRBC: 0 % (ref 0.0–0.2)

## 2021-06-14 LAB — CMP (CANCER CENTER ONLY)
ALT: 18 U/L (ref 0–44)
AST: 16 U/L (ref 15–41)
Albumin: 3.7 g/dL (ref 3.5–5.0)
Alkaline Phosphatase: 31 U/L — ABNORMAL LOW (ref 38–126)
Anion gap: 4 — ABNORMAL LOW (ref 5–15)
BUN: 19 mg/dL (ref 8–23)
CO2: 30 mmol/L (ref 22–32)
Calcium: 9.5 mg/dL (ref 8.9–10.3)
Chloride: 106 mmol/L (ref 98–111)
Creatinine: 0.94 mg/dL (ref 0.61–1.24)
GFR, Estimated: >60 mL/min (ref >60)
Glucose, Bld: 108 mg/dL — ABNORMAL HIGH (ref 70–99)
Potassium: 4.6 mmol/L (ref 3.5–5.1)
Sodium: 140 mmol/L (ref 135–145)
Total Bilirubin: 0.8 mg/dL (ref 0.3–1.2)
Total Protein: 6.4 g/dL — ABNORMAL LOW (ref 6.5–8.1)

## 2021-06-14 MED ORDER — HEPARIN SOD (PORK) LOCK FLUSH 100 UNIT/ML IV SOLN
INTRAVENOUS | Status: AC
  Filled 2021-06-14: qty 5

## 2021-06-14 MED ORDER — IOHEXOL 300 MG/ML  SOLN
100.0000 mL | Freq: Once | INTRAMUSCULAR | Status: AC | PRN
  Administered 2021-06-14: 100 mL via INTRAVENOUS

## 2021-06-14 MED ORDER — SODIUM CHLORIDE 0.9% FLUSH
10.0000 mL | Freq: Once | INTRAVENOUS | Status: AC
  Administered 2021-06-14: 10 mL

## 2021-06-14 MED ORDER — SODIUM CHLORIDE (PF) 0.9 % IJ SOLN
INTRAMUSCULAR | Status: AC
  Filled 2021-06-14: qty 50

## 2021-06-14 MED ORDER — HEPARIN SOD (PORK) LOCK FLUSH 100 UNIT/ML IV SOLN
500.0000 [IU] | Freq: Once | INTRAVENOUS | Status: AC
  Administered 2021-06-14: 500 [IU] via INTRAVENOUS

## 2021-06-15 LAB — PROSTATE-SPECIFIC AG, SERUM (LABCORP): Prostate Specific Ag, Serum: 1 ng/mL (ref 0.0–4.0)

## 2021-06-18 ENCOUNTER — Other Ambulatory Visit: Payer: Self-pay

## 2021-06-18 ENCOUNTER — Encounter: Payer: Self-pay | Admitting: Radiation Oncology

## 2021-06-18 ENCOUNTER — Inpatient Hospital Stay: Payer: PPO | Admitting: Dietician

## 2021-06-18 ENCOUNTER — Ambulatory Visit
Admission: RE | Admit: 2021-06-18 | Discharge: 2021-06-18 | Disposition: A | Discharge status: Home / Self Care | Payer: PPO | Source: Ambulatory Visit | Attending: Radiation Oncology | Admitting: Radiation Oncology

## 2021-06-18 VITALS — BP 111/70 | HR 79 | Temp 97.0°F | Resp 18 | Ht 67.0 in | Wt 184.0 lb

## 2021-06-18 DIAGNOSIS — Z79899 Other long term (current) drug therapy: Secondary | ICD-10-CM | POA: Diagnosis not present

## 2021-06-18 DIAGNOSIS — Z08 Encounter for follow-up examination after completed treatment for malignant neoplasm: Secondary | ICD-10-CM | POA: Diagnosis not present

## 2021-06-18 DIAGNOSIS — C770 Secondary and unspecified malignant neoplasm of lymph nodes of head, face and neck: Secondary | ICD-10-CM | POA: Diagnosis not present

## 2021-06-18 DIAGNOSIS — Z7901 Long term (current) use of anticoagulants: Secondary | ICD-10-CM | POA: Diagnosis not present

## 2021-06-18 DIAGNOSIS — R42 Dizziness and giddiness: Secondary | ICD-10-CM | POA: Diagnosis not present

## 2021-06-18 DIAGNOSIS — C801 Malignant (primary) neoplasm, unspecified: Secondary | ICD-10-CM | POA: Diagnosis not present

## 2021-06-18 DIAGNOSIS — I700 Atherosclerosis of aorta: Secondary | ICD-10-CM | POA: Diagnosis not present

## 2021-06-18 DIAGNOSIS — I2510 Atherosclerotic heart disease of native coronary artery without angina pectoris: Secondary | ICD-10-CM | POA: Diagnosis not present

## 2021-06-18 DIAGNOSIS — R682 Dry mouth, unspecified: Secondary | ICD-10-CM | POA: Diagnosis not present

## 2021-06-18 DIAGNOSIS — C760 Malignant neoplasm of head, face and neck: Secondary | ICD-10-CM

## 2021-06-18 NOTE — Progress Notes (Addendum)
Nicholas Daniels presents today for follow-up after completing radiation to his oropharynx on 03/12/2021, and to review CT scan results from 06/14/2021 ? ?Pain issues, if any: Reports tightness to behind left ear, but otherwise denies any issues or concerns ?Using a feeding tube?: Occasionally supplements with 1-2 cartons of osmolite during the day if he doesn't think he's consumed enough calories. ?Weight changes, if any:  ?Wt Readings from Last 3 Encounters:  ?06/18/21 184 lb (83.5 kg)  ?05/10/21 185 lb (83.9 kg)  ?05/03/21 188 lb 12.8 oz (85.6 kg)  ? ?Swallowing issues, if any: Patient denies. Saw Nicholas Daniels on 05/21/2021: "At this time pt swallowing is deemed WNL/WFL with dys III/regular diet and thin liquids. SLP reviewed pt's individualized HEP for dysphagia and pt completed each exercise on their own with independence. Pt is not completing HEP to recommended frequency There are no overt s/s aspiration reported by pt at this time." ?Smoking or chewing tobacco? None ?Using fluoride trays daily? Had F/U with Nicholas Daniels on 05/10/2021. Denies any new dental concerns or mouth sores ?Last ENT visit was on: Not since diagnosis ?Other notable issues, if any: Had F/U with his medical oncologist Nicholas Daniels on 05/03/2021. Continues to deal with dry mouth and altered since of taste (the latter is his biggest hurdle). Mild lymphedema to the right side of his neck (scheduled for PT session in April). Reports dizzy spells have improved over the past month.  ? ? ? ? ?

## 2021-06-18 NOTE — Progress Notes (Signed)
Nutrition Follow-up: ? ?Patient with SCC of oropharynx. He has completed concurrent chemoradiation, final radiation treatment 03/12/21. ? ?Met with patient and his wife in radiation clinic, nurse navigator present in room. Patient reports ongoing dry mouth as well as "bad taste" of most foods. He is eating more by mouth, recalling bacon, eggs, pizza, casseroles, CIB. Patient reports he is giving 1-2 cartons of Osmolite via tube on days when he doesn't feel intake has been sufficient. Some days he does not give any feedings. He is flushing tube daily with water. Patient reports having 2 cases of Osmolite 1.5 at home.  ? ?Medications: reviewed ? ?Labs: reviewed ? ?Anthropometrics: Weight 184 lb today stable x 6 weeks ? ?2/3 - 185 lb  ?1/27 - 188 lb 12.8 oz  ?12/22 - 191 lb  ? ? ?NUTRITION DIAGNOSIS: Inadequate oral intake ongoing, pt continues to supplement with intermittent tube feedings ? ? ?INTERVENTION:  ?Continue strategies for weaning from tube feedings ?Encouraged pt to increase oral intake, eating high calorie high protein foods to maintain weight ?Discussed increasing oral intake of CIB  ?Reviewed strategies for thick saliva - suggested trying ginger ale rinses in addition to baking soda salt water ?Reviewed strategies for altered taste ?  ? ?MONITORING, EVALUATION, GOAL: weight trends, intake, tube feeding ? ? ?NEXT VISIT: Monday April 17 with Pamala Hurry for weight check, readiness for PEG removal ? ? ? ?

## 2021-06-18 NOTE — Progress Notes (Signed)
?Radiation Oncology         [phone] ?________________________________ ? ?Name: Nicholas Daniels MRN: [id number]  ?Date: 06/18/2021  DOB: June 02, 1950 ? ?Follow-Up Visit Note ? ?CC: Nicholas Lopes, MD  Nicholas Gala, MD ? ?Diagnosis and Prior Radiotherapy:     ?  ICD-10-CM   ?1. 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  ?2. Head and neck cancer ([place])  C76.0   ?  ? ? ?Radiation Treatment Dates: 01/29/2021 through 03/12/2021 ?Site Technique Total Dose (Gy) Dose per Fx (Gy) Completed Fx Beam Energies  ?Oropharynx: HN_bilat IMRT 60/60 2 30/30 6X  ? ?CHIEF COMPLAINT:  Here for follow-up and surveillance of throat cancer ? ?Narrative:   Nicholas Daniels presents today for follow-up after completing radiation to his oropharynx on 03/12/2021, and to review CT scan results from 06/14/2021 ? ?Pain issues, if any: Reports tightness to behind left ear, but otherwise denies any issues or concerns ?Using a feeding tube?: Occasionally supplements with 1-2 cartons of osmolite during the day if he doesn't think he's consumed enough calories. ?Weight changes, if any:  ?Wt Readings from Last 3 Encounters:  ?06/18/21 184 lb (83.5 kg)  ?05/10/21 185 lb (83.9 kg)  ?05/03/21 188 lb 12.8 oz (85.6 kg)  ? ?Swallowing issues, if any: Patient denies. Saw Nicholas Daniels on 05/21/2021: "At this time pt swallowing is deemed WNL/WFL with dys III/regular diet and thin liquids. SLP reviewed pt's individualized HEP for dysphagia and pt completed each exercise on their own with independence. Pt is not completing HEP to recommended frequency There are no overt s/s aspiration reported by pt at this time." ?Smoking or chewing tobacco? None ?Using fluoride trays daily? Had F/U with Dr. Sandi Daniels on 05/10/2021. Denies any new dental concerns or mouth sores ?Last ENT visit was on: Not since diagnosis ?Other notable issues, if any: Had F/U with his medical oncologist Dr. Alen Daniels on 05/03/2021. Continues to deal with dry mouth  and altered since of taste (the latter is his biggest hurdle). Mild lymphedema to the right side of his neck (scheduled for PT session in April). Reports dizzy spells have improved over the past month.  ?                    ? ?ALLERGIES:  has No Known Allergies. ? ?Meds: ?Current Outpatient Medications  ?Medication Sig Dispense Refill  ? apixaban (ELIQUIS) 5 MG TABS tablet Place 1 tablet (5 mg total) into feeding tube 2 (two) times daily. 60 tablet 4  ? calcium-vitamin D (CALCIUM 500/D) 500-200 MG-UNIT tablet Take 1 tablet by mouth at bedtime. (Patient not taking: Reported on 03/27/2021)    ? chlorhexidine (PERIDEX) 0.12 % solution Use as directed 15 mLs in the mouth or throat 4 (four) times daily. 120 mL 0  ? diltiazem (CARDIZEM) 90 MG tablet Take 1 tablet (90 mg total) by mouth 3 (three) times daily. 90 tablet 4  ? Flaxseed, Linseed, (FLAXSEED OIL) 1200 MG CAPS Take 2,400 mg by mouth at bedtime. (Patient not taking: Reported on 03/27/2021)    ? guaiFENesin-dextromethorphan (ROBITUSSIN DM) 100-10 MG/5ML syrup Take 5-10 mLs by mouth every 4 (four) hours as needed for cough. 473 mL 3  ? latanoprost (XALATAN) 0.005 % ophthalmic solution Place 1 drop into both eyes at bedtime.    ? lidocaine (XYLOCAINE) 2 % solution Patient: Mix 1part 2% viscous lidocaine, 1part H20. Swish & swallow 92m of diluted mixture, 381m before meals and  at bedtime, up to QID (Patient not taking: Reported on 03/28/2021) 200 mL 3  ? lidocaine-prilocaine (EMLA) cream Apply 1 application topically as needed. (Patient not taking: Reported on 03/27/2021) 30 g 0  ? magic mouthwash w/lidocaine SOLN Take 5 mLs by mouth 3 (three) times daily. (Patient not taking: Reported on 03/28/2021) 50 mL 0  ? metoprolol tartrate (LOPRESSOR) 25 MG tablet Take 1 tablet (25 mg total) by mouth 3 (three) times daily. 90 tablet 4  ? Multiple Vitamin (MULTIVITAMIN) tablet Take 1 tablet by mouth at bedtime. (Patient not taking: Reported on 03/27/2021)    ? niacin 500 MG  CR capsule Take 500 mg by mouth at bedtime. (Patient not taking: Reported on 03/27/2021)    ? Nutritional Supplements (FEEDING SUPPLEMENT, OSMOLITE 1.5 CAL,) LIQD Osmolite 1.5 - Give 7 cartons split over four feedings/day via PEG. Flush tube with 90 ml water before and after each feeding. Drink by mouth or give via tube additional 2 cups fluids/day. This provides 2485 kcal, 104.3 g protein, 2467 ml total water. Meets 100% estimated needs. 1659 mL   ? omeprazole (PRILOSEC) 40 MG capsule TAKE 1 CAPSULE BY MOUTH EVERY DAY (Patient not taking: Reported on 03/27/2021) 90 capsule 3  ? ondansetron (ZOFRAN ODT) 8 MG disintegrating tablet Take 1 tablet (8 mg total) by mouth every 8 (eight) hours as needed for nausea or vomiting. 20 tablet 0  ? prochlorperazine (COMPAZINE) 10 MG tablet Take 1 tablet (10 mg total) by mouth every 6 (six) hours as needed for nausea or vomiting. (Patient not taking: Reported on 03/27/2021) 30 tablet 0  ? sodium fluoride (SODIUM FLUORIDE 5000 PLUS) 1.1 % CREA dental cream Place 1 pea-size drop onto toothbrush and brush teeth.  Use once a day at bedtime.  Spit out excess toothpaste, but DO NOT rinse with water, eat or drink for at least 30 minutes after use. 51 g 2  ? sucralfate (CARAFATE) 1 g tablet Take 1 tablet (1 g total) by mouth 2 (two) times daily. 60 tablet 1  ? timolol (TIMOPTIC) 0.5 % ophthalmic solution Place 1 drop into both eyes daily.    ? Vitamin D, Cholecalciferol, 50 MCG (2000 UT) CAPS Take 2,000 Units by mouth at bedtime. (Patient not taking: Reported on 03/27/2021)    ? ?No current facility-administered medications for this encounter.  ? ? ?Physical Findings: ?The patient is in no acute distress. Patient is alert and oriented. ?Wt Readings from Last 3 Encounters:  ?06/18/21 184 lb (83.5 kg)  ?05/10/21 185 lb (83.9 kg)  ?05/03/21 188 lb 12.8 oz (85.6 kg)  ? ? height is '5\' 7"'$  (1.702 m) and weight is 184 lb (83.5 kg). His temporal temperature is 97 ?F (36.1 ?C) (abnormal). His blood  pressure is 111/70 and his pulse is 79. His respiration is 18 and oxygen saturation is 98%. Marland Kitchen  ?General: Alert and oriented, in no acute distress ?HEENT: Head is normocephalic. Extraocular movements are intact. Oropharynx is clear ?Neck: Neck is notable for no masses; + anterior Lymphedema. ?Skin: Skin in treatment fields has healed well ?Psychiatric: Judgment and insight are intact. Affect is appropriate. ?Heart RRR ?Chest CTAB ? ?Lab Findings: ?Lab Results  ?Component Value Date  ? WBC 3.9 (L) 06/14/2021  ? HGB 13.3 06/14/2021  ? HCT 39.1 06/14/2021  ? MCV 95.8 06/14/2021  ? PLT 235 06/14/2021  ? ? ?Lab Results  ?Component Value Date  ? TSH 0.500 03/07/2021  ? ? ?Radiographic Findings: ?CT Soft Tissue Neck W Contrast ? ?  Result Date: 06/15/2021 ?CLINICAL DATA:  Head neck cancer monitoring. Follow-up cervical lymph nodes EXAM: CT NECK WITH CONTRAST TECHNIQUE: Multidetector CT imaging of the neck was performed using the standard protocol following the bolus administration of intravenous contrast. RADIATION DOSE REDUCTION: This exam was performed according to the departmental dose-optimization program which includes automated exposure control, adjustment of the mA and/or kV according to patient size and/or use of iterative reconstruction technique. CONTRAST:  118m OMNIPAQUE IOHEXOL 300 MG/ML  SOLN COMPARISON:  Head CT 01/07/2021 and neck CT 12/13/2020 FINDINGS: Pharynx and larynx: Submucosal low-density which has progressed from PET CT, typical post radiation changes. No cartilage or osseous erosion. No focal masslike areas of enhancement or asymmetry Salivary glands: Post treatment changes of the remaining right submandibular gland. Thyroid: Normal Lymph nodes: Left neck dissection since prior neck CT with non masslike architectural distortion and low-density around clips at the upper jugular chain, architecturally stable from prior PET CT. No measurable lymph node enlargement or heterogeneity. Vascular: Atheromatous  wall thickening. No evidence of pseudoaneurysm critical major vessel stenosis. Porta catheter on the right Limited intracranial: Negative Visualized orbits: Negative Mastoids and visualized paranasa

## 2021-06-20 NOTE — Progress Notes (Signed)
Oncology Nurse Navigator Documentation  ? ?I met with Nicholas Daniels and his wife during his post treatment follow up with Dr. Isidore Moos on 06/18/21. He is improving and ready to work on increasing his oral intake to eventually have his PEG removed. He will see Dr. Alen Blew the end of this month and Dr. Constance Holster in 3 months. He knows to call me if he has any questions or concerns.  ? ?Per this post-treatment follow-up with Dr. Isidore Moos, sent fax to [facility] ENT Scheduling with request  he be contacted and scheduled for routine post-RT follow-up.  Notification of successful fax transmission received.  ? ?Harlow Asa RN, BSN, OCN ?Head & Neck Oncology Nurse Navigator ?[place] at [facility] ?Phone # [phone]  ?Fax # [phone]  ?

## 2021-06-24 ENCOUNTER — Other Ambulatory Visit: Payer: Self-pay

## 2021-06-24 ENCOUNTER — Ambulatory Visit: Payer: PPO | Attending: Radiation Oncology | Admitting: Physical Therapy

## 2021-06-24 ENCOUNTER — Encounter: Payer: Self-pay | Admitting: Physical Therapy

## 2021-06-24 DIAGNOSIS — I890 Lymphedema, not elsewhere classified: Secondary | ICD-10-CM | POA: Diagnosis not present

## 2021-06-24 DIAGNOSIS — C770 Secondary and unspecified malignant neoplasm of lymph nodes of head, face and neck: Secondary | ICD-10-CM | POA: Diagnosis not present

## 2021-06-24 DIAGNOSIS — R293 Abnormal posture: Secondary | ICD-10-CM | POA: Insufficient documentation

## 2021-06-24 DIAGNOSIS — Z483 Aftercare following surgery for neoplasm: Secondary | ICD-10-CM | POA: Insufficient documentation

## 2021-06-24 NOTE — Therapy (Signed)
[facility] [place] ?[place], [place], [address] ?Phone: [phone]   Fax:  [phone] ? ?Physical Therapy Treatment ? ?Patient Details  ?Name: Nicholas Daniels ?MRN: [id number] ?Date of Birth: [birth year] ?Referring Provider (PT): Isidore Moos ? ? ?Encounter Date: 06/24/2021 ? ? PT End of Session - 06/24/21 0959   ? ? Visit Number 3   ? Number of Visits 11   ? Date for PT Re-Evaluation 07/22/21   ? PT Start Time 0900   ? PT Stop Time 0277   ? PT Time Calculation (min) 57 min   ? Activity Tolerance Patient tolerated treatment well   ? Behavior During Therapy [facility] for tasks assessed/performed   ? ?  ?  ? ?  ? ? ?Past Medical History:  ?Diagnosis Date  ? Atrial fibrillation ([place])   ? Cataract   ? removed bilateraly  ? Coronary artery calcification seen on CT scan   ? Cough   ? Essential hypertension   ? Glaucoma   ? Head and neck cancer ([place])   ? Hypertension   ? PFO (patent foramen ovale)   ? PFO noted on coronary CT 09/05/14  ? Prostate cancer [facility]) 2009  ? ? ?Past Surgical History:  ?Procedure Laterality Date  ? CATARACT EXTRACTION    ? bilateral and a wrinkle on retina on the left   ? CHOLECYSTECTOMY    ? COLONOSCOPY    ? DIRECT LARYNGOSCOPY N/A 12/17/2020  ? Procedure: DIRECT LARYNGOSCOPY WITH BIOPSIES;  Surgeon: Izora Gala, MD;  Location: [place];  Service: ENT;  Laterality: N/A;  ? ESOPHAGOSCOPY N/A 12/17/2020  ? Procedure: ESOPHAGOSCOPY;  Surgeon: Izora Gala, MD;  Location: [place];  Service: ENT;  Laterality: N/A;  ? IR GASTROSTOMY TUBE MOD SED  01/28/2021  ? IR IMAGING GUIDED PORT INSERTION  01/28/2021  ? PROSTATE SURGERY  2009  ? radiation treatment    ? RADICAL NECK DISSECTION Left 12/17/2020  ? Procedure: LEFT NECK DISSECTION;  Surgeon: Izora Gala, MD;  Location: [place];  Service: ENT;  Laterality: Left;  ? RESECTION OF MEDIASTINAL MASS N/A 02/26/2015  ? Procedure: RESECTION OF MEDIASTINAL MASS;  Surgeon: Ivin Poot, MD;  Location: [place];  Service:  Thoracic;  Laterality: N/A;  ? STERNOTOMY N/A 02/26/2015  ? Procedure: STERNOTOMY;  Surgeon: Ivin Poot, MD;  Location: [place];  Service: Thoracic;  Laterality: N/A;  ? UPPER GASTROINTESTINAL ENDOSCOPY    ? ? ?There were no vitals filed for this visit. ? ? Subjective Assessment - 06/24/21 0900   ? ? Subjective I had some bad dizzy spells for a while but they are getting better. I think it could have been related to A-fib or lack of good nutrition. I still get pretty fatigued. I sleep a lot during the day. I go to sleep sleeping at the computer. Pt reports he is walking 2-3x/wk for about 30-45 min. My weight is being maintained right now. I had lost a bunch. I have lost 25 lbs since I started treatment. The swelling is about the same. I wear the chip pack on my head daily. I try to wear it as much as possible. I do not currently sleep with it.   ? 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 The PET demonstrated enlarged  level 2 lymph nodes with minimal uptake concerning for neoplasm, 10/05/20 left cervical lymph node biopsy revealed poorly differentiated carcinoma, PSA staining was negative, 12/17/20 left neck level 1-3 lymph node biopsies revealing metastatic SCC in 2 of 15 lymph nodes with extracapsular extension present. CT scan on 12/31/20 demonstrated no chest metastasis, will receive 30 fractions of radiation to his Oropharynx and bilateral neck with weekly cisplatin.  He started on 01/29/21 and will complete on 03/13/21, PAC/PEG 01/28/21   ? Patient Stated Goals to gain info from providers   ? Currently in Pain? No/denies   ? Pain Score 0-No pain   ? ?  ?  ? ?  ? ? ? ? ? [facility] PT Assessment - 06/24/21 0001   ? ?  ? Assessment  ? Medical Diagnosis SCC of L cervical lymph nodes   ?  ? Precautions  ? Precautions Other (comment)   ? Precaution  Comments lymphedema of neck   ?  ? Restrictions  ? Weight Bearing Restrictions No   ?  ? Balance Screen  ? Has the patient fallen in the past 6 months No   ? Has the patient had a decrease in activity level because of a fear of falling?  No   ? Is the patient reluctant to leave their home because of a fear of falling?  No   ?  ? Prior Function  ? Level of Independence Independent   ? Vocation Self employed   ? Vocation Requirements videographer   ? Leisure pt walks 3x/wk for at least 30 min maybe longer   ?  ? Cognition  ? Overall Cognitive Status Within Functional Limits for tasks assessed   ?  ? AROM  ? Cervical Flexion WFL   ? Cervical Extension 25% limited due to healing incision   ? Cervical - Right Side [facility]   ? Cervical - Left Side [facility]   ? Cervical - Right Rotation WFL   ? Cervical - Left Rotation WFL   ? ?  ?  ? ?  ? ? ? ? LYMPHEDEMA/ONCOLOGY QUESTIONNAIRE - 06/24/21 0001   ? ?  ? Head and Neck  ? 4 cm superior to sternal notch around neck 42.6 cm   ? 6 cm superior to sternal notch around neck 43 cm   ? 8 cm superior to sternal notch around neck 44 cm   ? ?  ?  ? ?  ? ? ? ? ? ? ? ? ? ? ? ? ? [facility] Adult PT Treatment/Exercise - 06/24/21 0001   ? ?  ? Manual Therapy  ? Manual Therapy Manual Lymphatic Drainage (MLD)   ? Manual Lymphatic Drainage (MLD) in supine with head elevated: short neck, 5 diaphragmatic breaths, bilateral axillary nodes, bilateral pectoral nodes, bilateral chest, short neck, posterior, lateral and anterior neck then retracing all steps while educating pt throughout and having pt return demonstrate correc technique on opposite side - pt required mod-max verbal cues for proper skin stretch technique   ? ?  ?  ? ?  ? ? ? ? ? ? ? ? ? ? ? ? ? ? ? PT Long Term Goals - 02/07/21 0920   ? ?  ? PT LONG TERM GOAL #1  ? Title Pt will be independent in self MLD for long term management of lymphedema.   ? Time 4   ? Period Weeks   ? Status New   ? Target Date 03/07/21   ?  ?  PT LONG TERM GOAL #2   ? Title Pt will obtain appropriate compression garment for long term management of lymphedema.   ? Time 4   ? Period Weeks   ? Status New   ? Target Date 03/07/21   ? ?  ?  ? ?  ? ? ? ? ? ? ? ? Plan - 06/24/21 1001   ? ? Clinical Impression Statement Pt returns to PT with continued lymphedema of anterior neck after completing chemo and radiation for treatment of SCC of L cervical lymph nodes. He continues to demonstrate right sided neck swelling. Pt demonstrated swelling back in Nov 2022 but did not return for follow up appointments due to hospitilization for atrial fib. Continued to instruct pt in self MLD technique. Issued info regarding [place] for long term management of lymphedema and will send demographics to [employer] per pt request to check benefits. Pt would benefit from continued skilled PT services to decrease anterior neck lymphedema, improve scar mobility, and assist pt with obtaining a permanent compression garment for long term management of lymphedema.   ? PT Frequency 2x / week   ? PT Duration 4 weeks   ? PT Treatment/Interventions ADLs/Self Care Home Management;Therapeutic exercise;Patient/family education;Manual techniques;Manual lymph drainage;Scar mobilization;Passive range of motion;Taping;Vasopneumatic Device   ? PT Next Visit Plan begin MLD to head and neck and instruct using [place] anterior approach, instruct in scar mobilization, hear from [employer]?   ? PT Home Exercise Plan self MLD   ? Recommended Other Services demographics sent to [employer] on 06/24/21   ? Consulted and Agree with Plan of Care Patient   ? ?  ?  ? ?  ? ? ?Patient will benefit from skilled therapeutic intervention in order to improve the following deficits and impairments:  Postural dysfunction, Increased fascial restricitons, Decreased range of motion, Decreased scar mobility, Increased edema ? ?Visit Diagnosis: ?Lymphedema, not elsewhere classified ? ?Aftercare following surgery for neoplasm ? ?Abnormal posture ? 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 ? ? ? ?Problem List ?Patient Active Problem List  ? Diagnosis Date Noted  ? History of radiation to head and neck region 05/10/2021  ? 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

## 2021-06-27 NOTE — Progress Notes (Signed)
?Cardiology Office Note:   ? ?Date:  07/01/2021  ? ?ID:  RONELL DUFFUS, DOB [birth year], MRN [id number] ? ?PCP:  [name], MD  ?Cardiologist:  Buford Dresser, MD ? ?Referring MD: [name], MD  ? ?CC: follow up ? ?History of Present Illness:   ? ?Nicholas Daniels is a 71 y.o. 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 I met him during his recent hospitalization (discharged 03/12/21). ? ?Today: ?He is accompanied by his wife. Overall, he is feeling pretty good. He states that he is now in remission. Every once in a while he experiences significant fatigue and daytime somnolence. Occasionally he has a dizzy spell. ? ?He now has a smart watch to help monitor his heart rate. He denies any recent palpitations or tachycardic episodes. When he is walking, he will eventually need to stop and rest. They have worked on increasing his exercise. He is almost able to walk a mile before he needs to stop. ? ?He receives his nutrition via oral intake and PEG tube. He notes that usually his food tastes bad. They continue to be conscientious of his salt intake. Recently his diet included a bowl of cereal, carnation instant breakfast, 3 oz of country style steak, a few bites of mashed potatoes and applesauce. Meat seems to be the most tolerated food, and easier to swallow. ? ?They normally go to bed around 1-2 AM, and wake up at 11 AM.  ? ?Currently his regimen includes Eliquis, diltiazem, and metoprolol. Asking if we can consolidate medications now that he is able to swallow some pills. ? ?He denies any chest pain, or peripheral edema. No headaches, syncope, orthopnea, or PND. ? ? ?Past Medical History:  ?Diagnosis Date  ? Atrial fibrillation ([place])   ? Cataract   ? removed bilateraly  ? Coronary artery calcification seen on CT scan   ? Cough   ? Essential hypertension   ? Glaucoma    ? Head and neck cancer ([place])   ? Hypertension   ? PFO (patent foramen ovale)   ? PFO noted on coronary CT 09/05/14  ? Prostate cancer [facility]) 2009  ? ? ?Past Surgical History:  ?Procedure Laterality Date  ? CATARACT EXTRACTION    ? bilateral and a wrinkle on retina on the left   ? CHOLECYSTECTOMY    ? COLONOSCOPY    ? DIRECT LARYNGOSCOPY N/A 12/17/2020  ? Procedure: DIRECT LARYNGOSCOPY WITH BIOPSIES;  Surgeon: Izora Gala, MD;  Location: [place];  Service: ENT;  Laterality: N/A;  ? ESOPHAGOSCOPY N/A 12/17/2020  ? Procedure: ESOPHAGOSCOPY;  Surgeon: Izora Gala, MD;  Location: [place];  Service: ENT;  Laterality: N/A;  ? IR GASTROSTOMY TUBE MOD SED  01/28/2021  ? IR IMAGING GUIDED PORT INSERTION  01/28/2021  ? PROSTATE SURGERY  2009  ? radiation treatment    ? RADICAL NECK DISSECTION Left 12/17/2020  ? Procedure: LEFT NECK DISSECTION;  Surgeon: Izora Gala, MD;  Location: [place];  Service: ENT;  Laterality: Left;  ? RESECTION OF MEDIASTINAL MASS N/A 02/26/2015  ? Procedure: RESECTION OF MEDIASTINAL MASS;  Surgeon: Ivin Poot, MD;  Location: [place];  Service: Thoracic;  Laterality: N/A;  ? STERNOTOMY N/A 02/26/2015  ? Procedure: STERNOTOMY;  Surgeon: Ivin Poot, MD;  [facility];  Service: Thoracic;  Laterality: N/A;  ? UPPER GASTROINTESTINAL ENDOSCOPY    ? ? ?  Current Medications: ?Current Outpatient Medications on File Prior to Visit  ?Medication Sig  ? apixaban (ELIQUIS) 5 MG TABS tablet Place 1 tablet (5 mg total) into feeding tube 2 (two) times daily.  ? metoprolol tartrate (LOPRESSOR) 25 MG tablet Take 1 tablet (25 mg total) by mouth 3 (three) times daily.  ? Nutritional Supplements (FEEDING SUPPLEMENT, OSMOLITE 1.5 CAL,) LIQD Osmolite 1.5 - Give 7 cartons split over four feedings/day via PEG. Flush tube with 90 ml water before and after each feeding. Drink by mouth or give via tube additional 2 cups fluids/day. This provides 2485 kcal, 104.3 g protein, 2467 ml total water. Meets 100% estimated needs.  ?  latanoprost (XALATAN) 0.005 % ophthalmic solution Place 1 drop into both eyes at bedtime. (Patient not taking: Reported on 07/01/2021)  ? Multiple Vitamin (MULTIVITAMIN) tablet Take 1 tablet by mouth at bedtime. (Patient not taking: Reported on 03/27/2021)  ? timolol (TIMOPTIC) 0.5 % ophthalmic solution Place 1 drop into both eyes daily. (Patient not taking: Reported on 07/01/2021)  ? Vitamin D, Cholecalciferol, 50 MCG (2000 UT) CAPS Take 2,000 Units by mouth at bedtime. (Patient not taking: Reported on 03/27/2021)  ? ?No current facility-administered medications on file prior to visit.  ?  ? ?Allergies:   Patient has no known allergies.  ? ?Social History  ? ?Tobacco Use  ? Smoking status: Never  ?  Passive exposure: Past  ? Smokeless tobacco: Never  ?Vaping Use  ? Vaping Use: Never used  ?Substance Use Topics  ? Alcohol use: No  ? Drug use: No  ? ? ?Family History: ?family history includes COPD in his brother, father, and mother; Cancer in his brother; Colon polyps in an other family member; Diabetes in his mother; Heart disease in his brother and mother; Liver cancer in his brother; Pneumonia in his mother. There is no history of Esophageal cancer, Rectal cancer, or Stomach cancer. ? ?ROS:   ?Please see the history of present illness. ?(+) Fatigue ?(+) Shortness of breath ?(+) Daytime somnolence ?(+) Dizziness ?All other systems are reviewed and negative.   ? ? ?EKGs/Labs/Other Studies Reviewed:   ? ?The following studies were reviewed today: ? ?CT Chest 06/14/2021: ?FINDINGS: ?Cardiovascular: Ill-defined soft tissue stranding in the lower left ?cervical region, incompletely imaged (refer to contemporaneously ?obtained CT the neck for full description). Heart size is normal. ?There is no significant pericardial fluid, thickening or pericardial ?calcification. There is aortic atherosclerosis, as well as ?atherosclerosis of the great vessels of the mediastinum and the ?coronary arteries, including calcified  atherosclerotic plaque in the ?left anterior descending coronary artery. Right internal jugular ?single-lumen porta cath with tip terminating in the right atrium. ?  ?Mediastinum/Nodes: No pathologically enlarged mediastinal or hilar ?lymph nodes. Esophagus is unremarkable in appearance. No axillary ?lymphadenopathy. ?  ?Lungs/Pleura: There are some new areas of ground-glass attenuation ?and ill-defined areas of pleuroparenchymal nodularity in the apices ?of the lungs bilaterally, presumably evolving postradiation changes. ?No other suspicious appearing pulmonary nodules or masses are noted. ?No acute consolidative airspace disease. No pleural effusions. ?  ?Upper Abdomen: Aortic atherosclerosis. Status post cholecystectomy. ?Percutaneous gastrostomy tube with retention balloon in the distal ?body of the stomach. ?  ?Musculoskeletal: Median sternotomy wires. There are no aggressive ?appearing lytic or blastic lesions noted in the visualized portions ?of the skeleton. ?  ?IMPRESSION: ?1. No definitive findings to suggest metastatic disease in the ?thorax. ?2. Evolving postradiation changes in the lung apices bilaterally. ?3. Aortic atherosclerosis, in addition to left anterior descending ?  coronary artery disease. Please note that although the presence of ?coronary artery calcium documents the presence of coronary artery ?disease, the severity of this disease and any potential stenosis ?cannot be assessed on this non-gated CT examination. Assessment for ?potential risk factor modification, dietary therapy or pharmacologic ?therapy may be warranted, if clinically indicated. ?4. Additional incidental findings, as above. ?  ?Aortic Atherosclerosis (ICD10-I70.0). ? ?Echo 03/07/2021: ? 1. Left ventricular ejection fraction, by estimation, is 55 to 60%. The  ?left ventricle has normal function. The left ventricle has no regional  ?wall motion abnormalities. Left ventricular diastolic function could not  ?be evaluated.  ? 2.  Right ventricular systolic function is normal. The right ventricular  ?size is normal. There is moderately elevated pulmonary artery systolic  ?pressure.  ? 3. The mitral valve is normal in structure. Mild mitra

## 2021-07-01 ENCOUNTER — Other Ambulatory Visit: Payer: Self-pay

## 2021-07-01 ENCOUNTER — Encounter (HOSPITAL_BASED_OUTPATIENT_CLINIC_OR_DEPARTMENT_OTHER): Payer: Self-pay | Admitting: Cardiology

## 2021-07-01 ENCOUNTER — Ambulatory Visit (HOSPITAL_BASED_OUTPATIENT_CLINIC_OR_DEPARTMENT_OTHER): Payer: PPO | Admitting: Cardiology

## 2021-07-01 VITALS — BP 106/70 | HR 61 | Ht 67.0 in | Wt 183.8 lb

## 2021-07-01 DIAGNOSIS — Z7189 Other specified counseling: Secondary | ICD-10-CM | POA: Diagnosis not present

## 2021-07-01 DIAGNOSIS — D6869 Other thrombophilia: Secondary | ICD-10-CM | POA: Diagnosis not present

## 2021-07-01 DIAGNOSIS — Z7901 Long term (current) use of anticoagulants: Secondary | ICD-10-CM | POA: Diagnosis not present

## 2021-07-01 DIAGNOSIS — I4819 Other persistent atrial fibrillation: Secondary | ICD-10-CM

## 2021-07-01 DIAGNOSIS — C760 Malignant neoplasm of head, face and neck: Secondary | ICD-10-CM

## 2021-07-01 MED ORDER — DILTIAZEM HCL ER COATED BEADS 120 MG PO CP24: 120.0000 mg | ORAL_CAPSULE | Freq: Two times a day (BID) | ORAL | 3 refills | Status: DC

## 2021-07-01 NOTE — Patient Instructions (Signed)
Medication Instructions:  ?STOP: Diltiazem 90 mg three times a day ?START: Diltiazem 120 mg twice a day ? ?*If you need a refill on your cardiac medications before your next appointment, please call your pharmacy* ? ? ?Lab Work: ?None ordered today ? ? ?Testing/Procedures: ?None ordered today ? ? ?Follow-Up: ?At [facility], you and your health needs are our priority.  As part of our continuing mission to provide you with exceptional heart care, we have created designated Provider Care Teams.  These Care Teams include your primary Cardiologist (physician) and Advanced Practice Providers (APPs -  Physician Assistants and Nurse Practitioners) who all work together to provide you with the care you need, when you need it. ? ?We recommend signing up for the patient portal called "MyChart".  Sign up information is provided on this After Visit Summary.  MyChart is used to connect with patients for Virtual Visits (Telemedicine).  Patients are able to view lab/test results, encounter notes, upcoming appointments, etc.  Non-urgent messages can be sent to your provider as well.   ?To learn more about what you can do with MyChart, go to [email or web address].   ? ?Your next appointment:   ?2 -3 month(s) ? ?The format for your next appointment:   ?In Person ? ?Provider:   ?Buford Dresser, MD{ ? ? ?

## 2021-07-02 ENCOUNTER — Ambulatory Visit: Payer: PPO | Admitting: Physical Therapy

## 2021-07-02 ENCOUNTER — Encounter: Payer: Self-pay | Admitting: Physical Therapy

## 2021-07-02 DIAGNOSIS — I890 Lymphedema, not elsewhere classified: Secondary | ICD-10-CM

## 2021-07-02 DIAGNOSIS — Z483 Aftercare following surgery for neoplasm: Secondary | ICD-10-CM

## 2021-07-02 DIAGNOSIS — C770 Secondary and unspecified malignant neoplasm of lymph nodes of head, face and neck: Secondary | ICD-10-CM

## 2021-07-02 DIAGNOSIS — R293 Abnormal posture: Secondary | ICD-10-CM

## 2021-07-02 NOTE — Therapy (Signed)
[facility] [place] ?[place], [place], [address] ?Phone: [phone]   Fax:  [phone] ? ?Physical Therapy Treatment ? ?Patient Details  ?Name: Nicholas Daniels ?MRN: [id number] ?Date of Birth: 10-Jul-1950 ?Referring Provider (PT): Isidore Moos ? ? ?Encounter Date: 07/02/2021 ? ? PT End of Session - 07/02/21 0807   ? ? Visit Number 4   ? Number of Visits 11   ? Date for PT Re-Evaluation 07/22/21   ? PT Start Time [phone]   ? PT Stop Time 4010   ? PT Time Calculation (min) 69 min   ? Activity Tolerance Patient tolerated treatment well   ? Behavior During Therapy [facility] for tasks assessed/performed   ? ?  ?  ? ?  ? ? ?Past Medical History:  ?Diagnosis Date  ? Atrial fibrillation ([place])   ? Cataract   ? removed bilateraly  ? Coronary artery calcification seen on CT scan   ? Cough   ? Essential hypertension   ? Glaucoma   ? Head and neck cancer ([place])   ? Hypertension   ? PFO (patent foramen ovale)   ? PFO noted on coronary CT 09/05/14  ? Prostate cancer [facility]) 2009  ? ? ?Past Surgical History:  ?Procedure Laterality Date  ? CATARACT EXTRACTION    ? bilateral and a wrinkle on retina on the left   ? CHOLECYSTECTOMY    ? COLONOSCOPY    ? DIRECT LARYNGOSCOPY N/A 12/17/2020  ? Procedure: DIRECT LARYNGOSCOPY WITH BIOPSIES;  Surgeon: Izora Gala, MD;  Location: [place];  Service: ENT;  Laterality: N/A;  ? ESOPHAGOSCOPY N/A 12/17/2020  ? Procedure: ESOPHAGOSCOPY;  Surgeon: Izora Gala, MD;  Location: [place];  Service: ENT;  Laterality: N/A;  ? IR GASTROSTOMY TUBE MOD SED  01/28/2021  ? IR IMAGING GUIDED PORT INSERTION  01/28/2021  ? PROSTATE SURGERY  2009  ? radiation treatment    ? RADICAL NECK DISSECTION Left 12/17/2020  ? Procedure: LEFT NECK DISSECTION;  Surgeon: Izora Gala, MD;  Location: [place];  Service: ENT;  Laterality: Left;  ? RESECTION OF MEDIASTINAL MASS N/A 02/26/2015  ? Procedure: RESECTION OF MEDIASTINAL MASS;  Surgeon: Ivin Poot, MD;  Location: [place];  Service:  Thoracic;  Laterality: N/A;  ? STERNOTOMY N/A 02/26/2015  ? Procedure: STERNOTOMY;  Surgeon: Ivin Poot, MD;  Location: [place];  Service: Thoracic;  Laterality: N/A;  ? UPPER GASTROINTESTINAL ENDOSCOPY    ? ? ?There were no vitals filed for this visit. ? ? Subjective Assessment - 07/02/21 0921   ? ? Subjective I feel so so about the self masssage.   ? 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 The PET demonstrated enlarged level 2 lymph nodes with minimal uptake concerning for neoplasm, 10/05/20 left cervical lymph node biopsy revealed poorly differentiated carcinoma, PSA staining was negative, 12/17/20 left neck level 1-3 lymph node biopsies revealing metastatic SCC in 2 of 15 lymph nodes with extracapsular extension present. CT scan on 12/31/20 demonstrated no chest metastasis, will receive 30 fractions of radiation to his Oropharynx and bilateral neck with weekly cisplatin.  He started on 01/29/21 and will complete on 03/13/21, PAC/PEG 01/28/21   ? Patient Stated Goals to gain info from providers   ? Currently in Pain? No/denies   ? Pain  Score 0-No pain   ? ?  ?  ? ?  ? ? ? ? ? ? ? ? ? ? ? ? ? ? ? ? ? ? ? ? [place] Adult PT Treatment/Exercise - 07/02/21 0001   ? ?  ? Manual Therapy  ? Manual Lymphatic Drainage (MLD) seated edge of bed - had pt follow handout and demonstrate the following: short neck, 5 diaphragmatic breaths, bilateral axillary nodes, bilateral pectoral nodes, bilateral chest, short neck, posterior, lateral and anterior neck then retracing all steps while having pt demonstrate all steps and therapist provided appropriate verbal and tactile cues for correct direction of stretch, correct skin stretch technique and proper sequence. By end of session pt required much less cueing and demosntrated improved skin stretch technique.   ? ?   ?  ? ?  ? ? ? ? ? ? ? ? ? ? ? ? ? ? ? ? ? ? ? ? ? ? Plan - 07/02/21 0921   ? ? Clinical Impression Statement Spent this session educating pt on correct technique and skin stretch while having pt follow handout and demonstrate all steps as he would at home. Therapist provided verbal and tactile cues as appropriate to progress pt towards increased independence with self MLD technique. Pt would benefit from a FlexiTouch compression pump at home to assist him in independent management. Pt requires the FlexiTouch advanced pump due to the lack of a basic pump that has a head and neck component. There are currently no basic pumps with a head and neck component on the market.   ? PT Frequency 2x / week   ? PT Duration 4 weeks   ? PT Treatment/Interventions ADLs/Self Care Home Management;Therapeutic exercise;Patient/family education;Manual techniques;Manual lymph drainage;Scar mobilization;Passive range of motion;Taping;Vasopneumatic Device   ? PT Next Visit Plan continue MLD to head and neck and instruct using Norton anterior approach, instruct in scar mobilization, hear from Flexi?   ? PT Home Exercise Plan self MLD   ? Consulted and Agree with Plan of Care Patient   ? ?  ?  ? ?  ? ? ?Patient will benefit from skilled therapeutic intervention in order to improve the following deficits and impairments:  Postural dysfunction, Increased fascial restricitons, Decreased range of motion, Decreased scar mobility, Increased edema ? ?Visit Diagnosis: ?Lymphedema, not elsewhere classified ? ?Aftercare following surgery for neoplasm ? ?Abnormal posture ? 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 ? ? ? ?Problem List ?Patient Active Problem List  ? Diagnosis Date Noted  ? History of radiation to head and neck region 05/10/2021  ? 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  ? Trismus 05/10/2021  ? Dysgeusia 05/10/2021  ? Atrial fibrillation with RVR ([place]) 03/05/2021  ? Port-A-Cath in place 02/05/2021  ? Head and neck cancer ([place])  01/01/2021  ? SPK (superficial punctate keratitis), left 12/13/2020  ? Dry eyes, bilateral 12/13/2020  ? 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 Increasing PSA level after treatment for prostate cancer 11/22/2020  ? Macular retinoschisis, left 11/08/2020  ? Left epiretinal membrane 10/17/2019  ? Right epiretinal membrane 10/17/2019  ? Early stage nonexudative age-related macular degeneration of both eyes 10/17/2019  ? Drug-induced gynecomastia 08/14/2015  ? Mediastinal mass 08/09/2014  ? History of prostate cancer 08/09/2014  ? Hypertension 08/09/2014  ? ? ?Allyson Sabal Pierce, PT ?07/02/2021, 9:24 AM ? ? ?[facility] [place] ?[place], [place], [address] ?Phone: [phone]  Fax:  [phone] ? ?Name: Nicholas Daniels ?MRN: [id number] ?Date of Birth: 1950-11-24 ? ? ? ?

## 2021-07-03 ENCOUNTER — Inpatient Hospital Stay: Payer: PPO

## 2021-07-03 ENCOUNTER — Inpatient Hospital Stay: Payer: PPO | Admitting: Oncology

## 2021-07-03 ENCOUNTER — Other Ambulatory Visit: Payer: Self-pay

## 2021-07-03 VITALS — BP 118/64 | HR 82 | Temp 97.9°F | Resp 18 | Ht 67.0 in | Wt 182.8 lb

## 2021-07-03 DIAGNOSIS — C770 Secondary and unspecified malignant neoplasm of lymph nodes of head, face and neck: Secondary | ICD-10-CM | POA: Insufficient documentation

## 2021-07-03 DIAGNOSIS — Z8546 Personal history of malignant neoplasm of prostate: Secondary | ICD-10-CM

## 2021-07-03 DIAGNOSIS — C801 Malignant (primary) neoplasm, unspecified: Secondary | ICD-10-CM | POA: Insufficient documentation

## 2021-07-03 DIAGNOSIS — Z452 Encounter for adjustment and management of vascular access device: Secondary | ICD-10-CM | POA: Diagnosis not present

## 2021-07-03 DIAGNOSIS — C760 Malignant neoplasm of head, face and neck: Secondary | ICD-10-CM

## 2021-07-03 DIAGNOSIS — Z95828 Presence of other vascular implants and grafts: Secondary | ICD-10-CM

## 2021-07-03 LAB — CBC WITH DIFFERENTIAL (CANCER CENTER ONLY)
Abs Immature Granulocytes: 0.01 10*3/uL (ref 0.00–0.07)
Basophils Absolute: 0 10*3/uL (ref 0.0–0.1)
Basophils Relative: 0 %
Eosinophils Absolute: 0.2 10*3/uL (ref 0.0–0.5)
Eosinophils Relative: 5 %
HCT: 39.3 % (ref 39.0–52.0)
Hemoglobin: 13.4 g/dL (ref 13.0–17.0)
Immature Granulocytes: 0 %
Lymphocytes Relative: 21 %
Lymphs Abs: 0.9 10*3/uL (ref 0.7–4.0)
MCH: 32.1 pg (ref 26.0–34.0)
MCHC: 34.1 g/dL (ref 30.0–36.0)
MCV: 94 fL (ref 80.0–100.0)
Monocytes Absolute: 0.6 10*3/uL (ref 0.1–1.0)
Monocytes Relative: 13 %
Neutro Abs: 2.7 10*3/uL (ref 1.7–7.7)
Neutrophils Relative %: 61 %
Platelet Count: 219 10*3/uL (ref 150–400)
RBC: 4.18 MIL/uL — ABNORMAL LOW (ref 4.22–5.81)
RDW: 12.1 % (ref 11.5–15.5)
WBC Count: 4.4 10*3/uL (ref 4.0–10.5)
nRBC: 0 % (ref 0.0–0.2)

## 2021-07-03 LAB — CMP (CANCER CENTER ONLY)
ALT: 21 U/L (ref 0–44)
AST: 17 U/L (ref 15–41)
Albumin: 3.7 g/dL (ref 3.5–5.0)
Alkaline Phosphatase: 27 U/L — ABNORMAL LOW (ref 38–126)
Anion gap: 4 — ABNORMAL LOW (ref 5–15)
BUN: 19 mg/dL (ref 8–23)
CO2: 30 mmol/L (ref 22–32)
Calcium: 9.5 mg/dL (ref 8.9–10.3)
Chloride: 106 mmol/L (ref 98–111)
Creatinine: 0.99 mg/dL (ref 0.61–1.24)
GFR, Estimated: >60 mL/min (ref >60)
Glucose, Bld: 100 mg/dL — ABNORMAL HIGH (ref 70–99)
Potassium: 4.5 mmol/L (ref 3.5–5.1)
Sodium: 140 mmol/L (ref 135–145)
Total Bilirubin: 0.9 mg/dL (ref 0.3–1.2)
Total Protein: 6.5 g/dL (ref 6.5–8.1)

## 2021-07-03 MED ORDER — HEPARIN SOD (PORK) LOCK FLUSH 100 UNIT/ML IV SOLN
500.0000 [IU] | Freq: Once | INTRAVENOUS | Status: AC
  Administered 2021-07-03: 500 [IU]

## 2021-07-03 MED ORDER — SODIUM CHLORIDE 0.9% FLUSH
10.0000 mL | Freq: Once | INTRAVENOUS | Status: AC
  Administered 2021-07-03: 10 mL

## 2021-07-03 NOTE — Progress Notes (Signed)
Hematology and Oncology Follow Up Visit ? ?Nicholas Daniels ?[id number] ?14-Aug-1950 71 y.o. ?07/03/2021 12:51 PM ?[name], MDPaterson, [name], MD  ? ?Principle Diagnosis: 71 year old with: ? ?Squamous cell carcinoma of unknown primary presented with cervical adenopathy and p16 positive tumor diagnosed in September 2022.  He was found to have stage IV disease at that time. ? ? ?2.   Prostate cancer diagnosed in 2009.  He was found to have T3N0 Gleason score 3+4 = 7 and currently with biochemical relapse. ? ?Prior Therapy: ? ?He underwent prostatectomy and found to have a T3a N0 Gleason score 3+4 = 7 cancer.  He underwent salvage radiation in 2012.  ? ?He developed a rise in his PSA and enrolled in the [facility] trial.   He was off enzalutamide since August 2021 and off clinical trial at that time.  His PSA started to rise in the last year with a PSA in May 2022 was 0.56.  Previously was 0.35 and it was undetectable in May 2021.  Prior to enzalutamide his PSA was as high as 8.27 and declined to undetectable level.  He is of any prostate cancer treatment at this time. ? ?He is status post direct laryngoscopy and left neck dissection completed by Dr. Constance Holster on December 17, 2020.  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 ? ? ? Adjuvant radiation therapy with weekly cisplatin started on February 05, 2021.  He completed adjuvant radiation therapy with cisplatin in November 2022. ? ?Current therapy: Active surveillance ? ?Interim History: [name] is here for a follow-up visit.  Since the last visit, he reports of feeling well and continues to improve slowly from his previous treatment.  He denies any dysphagia or odontophagia.  He denies any neck pain or discomfort.  He denies any palpitation, shortness of breath or dyspnea on exertion.  He continues to have issues with altered taste periodically and does not rely on his feeding tube for  supplementation. ? ? ? ? ?Medications: Reviewed without changes. ?Current Outpatient Medications  ?Medication Sig Dispense Refill  ? apixaban (ELIQUIS) 5 MG TABS tablet Place 1 tablet (5 mg total) into feeding tube 2 (two) times daily. 60 tablet 4  ? diltiazem (CARDIZEM CD) 120 MG 24 hr capsule Take 1 capsule (120 mg total) by mouth in the morning and at bedtime. 180 capsule 3  ? latanoprost (XALATAN) 0.005 % ophthalmic solution Place 1 drop into both eyes at bedtime. (Patient not taking: Reported on 07/01/2021)    ? metoprolol tartrate (LOPRESSOR) 25 MG tablet Take 1 tablet (25 mg total) by mouth 3 (three) times daily. 90 tablet 4  ? Multiple Vitamin (MULTIVITAMIN) tablet Take 1 tablet by mouth at bedtime. (Patient not taking: Reported on 03/27/2021)    ? Nutritional Supplements (FEEDING SUPPLEMENT, OSMOLITE 1.5 CAL,) LIQD Osmolite 1.5 - Give 7 cartons split over four feedings/day via PEG. Flush tube with 90 ml water before and after each feeding. Drink by mouth or give via tube additional 2 cups fluids/day. This provides 2485 kcal, 104.3 g protein, 2467 ml total water. Meets 100% estimated needs. 1659 mL   ? timolol (TIMOPTIC) 0.5 % ophthalmic solution Place 1 drop into both eyes daily. (Patient not taking: Reported on 07/01/2021)    ? Vitamin D, Cholecalciferol, 50 MCG (2000 UT) CAPS Take 2,000 Units by mouth at bedtime. (Patient not taking: Reported on 03/27/2021)    ? ?No current facility-administered medications for this visit.  ? ? ? ?  Allergies: No Known Allergies ? ? ?Physical Exam: ?Blood pressure 118/64, pulse 82, temperature 97.9 ?F (36.6 ?C), temperature source Temporal, resp. rate 18, height '5\' 7"'$  (1.702 m), weight 182 lb 12.8 oz (82.9 kg), SpO2 100 %. ? ? ? ?ECOG: 1 ? ? ?General appearance: Comfortable appearing without any discomfort ?Head: Normocephalic without any trauma ?Oropharynx: Mucous membranes are moist and pink without any thrush or ulcers. ?Eyes: Pupils are equal and round reactive to  light. ?Lymph nodes: No cervical, supraclavicular, inguinal or axillary lymphadenopathy.   ?Heart:regular rate and rhythm.  S1 and S2 without leg edema. ?Lung: Clear without any rhonchi or wheezes.  No dullness to percussion. ?Abdomin: Soft, nontender, nondistended with good bowel sounds.  No hepatosplenomegaly. ?Musculoskeletal: No joint deformity or effusion.  Full range of motion noted. ?Neurological: No deficits noted on motor, sensory and deep tendon reflex exam. ?Skin: No petechial rash or dryness.  Appeared moist.  ? ? ? ? ? ? ? ?Lab Results: ?Lab Results  ?Component Value Date  ? WBC 3.9 (L) 06/14/2021  ? HGB 13.3 06/14/2021  ? HCT 39.1 06/14/2021  ? MCV 95.8 06/14/2021  ? PLT 235 06/14/2021  ? ?  Chemistry   ?   ?Component Value Date/Time  ? NA 140 06/14/2021 1409  ? K 4.6 06/14/2021 1409  ? CL 106 06/14/2021 1409  ? CO2 30 06/14/2021 1409  ? BUN 19 06/14/2021 1409  ? CREATININE 0.94 06/14/2021 1409  ? CREATININE 1.10 02/13/2015 1545  ?    ?Component Value Date/Time  ? CALCIUM 9.5 06/14/2021 1409  ? ALKPHOS 31 (L) 06/14/2021 1409  ? AST 16 06/14/2021 1409  ? ALT 18 06/14/2021 1409  ? BILITOT 0.8 06/14/2021 1409  ?  ? ? ?IMPRESSION: ?1. No definitive findings to suggest metastatic disease in the ?thorax. ?2. Evolving postradiation changes in the lung apices bilaterally. ?3. Aortic atherosclerosis, in addition to left anterior descending ?coronary artery disease. Please note that although the presence of ?coronary artery calcium documents the presence of coronary artery ?disease, the severity of this disease and any potential stenosis ?cannot be assessed on this non-gated CT examination. Assessment for ?potential risk factor modification, dietary therapy or pharmacologic ?therapy may be warranted, if clinically indicated. ?4. Additional incidental findings, as above. ?  ?IMPRESSION: ?Stable post treatment neck when compared to PET CT 01/07/2021. No ?evidence of recurrence. ?  ?Impression and  Plan: ? ?Assessment and Plan:  ?  ?  ?71 year old with: ? ?1.  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   ?  ?The natural course of this disease was reviewed at this time and treatment choices were reiterated.  He has completed surgical resection with adjuvant radiation with weekly cisplatin.  CT scan obtained on June 14, 2021 showed no evidence of relapsed disease.  I have recommended continued active surveillance at this time I will repeat imaging studies in 6 months. ? ?  ?2.  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  He is following with Dr. Jeffie Pollock regarding this issue and likely will require restarting androgen deprivation therapy at that time. ? ?3.  IV access: Port-A-Cath currently in place and flushed periodically.  Risks and benefits of removing Port-A-Cath was reviewed. ? ?4.  Nutritional status: PEG tube remains in place.  He continues to use this as a supplementation. ? ?5.  Myelosuppression: Resolved at this time after the conclusion of chemotherapy. ? ?6.  Atrial fibrillation: No recent exacerbation noted at this time. ? ? ?7.   Follow-up: In 6 months for repeat evaluation. ?  ?30  minutes were dedicated to this visit.  The time was spent on reviewing laboratory data, disease status update and treatment choices for the future. ? ? ? ?Zola Button, MD ?3/29/202312:51 PM ? ?

## 2021-07-04 LAB — PROSTATE-SPECIFIC AG, SERUM (LABCORP): Prostate Specific Ag, Serum: 0.9 ng/mL (ref 0.0–4.0)

## 2021-07-09 ENCOUNTER — Ambulatory Visit: Payer: PPO | Attending: Radiation Oncology | Admitting: Physical Therapy

## 2021-07-09 ENCOUNTER — Encounter: Payer: Self-pay | Admitting: Physical Therapy

## 2021-07-09 DIAGNOSIS — I890 Lymphedema, not elsewhere classified: Secondary | ICD-10-CM | POA: Insufficient documentation

## 2021-07-09 DIAGNOSIS — R1310 Dysphagia, unspecified: Secondary | ICD-10-CM | POA: Insufficient documentation

## 2021-07-09 DIAGNOSIS — C770 Secondary and unspecified malignant neoplasm of lymph nodes of head, face and neck: Secondary | ICD-10-CM | POA: Diagnosis not present

## 2021-07-09 DIAGNOSIS — R293 Abnormal posture: Secondary | ICD-10-CM | POA: Diagnosis not present

## 2021-07-09 DIAGNOSIS — Z483 Aftercare following surgery for neoplasm: Secondary | ICD-10-CM | POA: Diagnosis not present

## 2021-07-09 NOTE — Therapy (Signed)
[place] ?[place] ?[place], [place], [address] ?Phone: [phone]   Fax:  [phone] ? ?Physical Therapy Treatment ? ?Patient Details  ?Name: Nicholas Daniels ?MRN: [id number] ?Date of Birth: Oct 20, 1950 ?Referring Provider (PT): Isidore Moos ? ? ?Encounter Date: 07/09/2021 ? ? PT End of Session - 07/09/21 1505   ? ? Visit Number 5   ? Number of Visits 11   ? Date for PT Re-Evaluation 07/22/21   ? PT Start Time 0109   ? PT Stop Time 3235   ? PT Time Calculation (min) 44 min   ? Activity Tolerance Patient tolerated treatment well   ? Behavior During Therapy [facility] for tasks assessed/performed   ? ?  ?  ? ?  ? ? ?Past Medical History:  ?Diagnosis Date  ? Atrial fibrillation ([place])   ? Cataract   ? removed bilateraly  ? Coronary artery calcification seen on CT scan   ? Cough   ? Essential hypertension   ? Glaucoma   ? Head and neck cancer ([place])   ? Hypertension   ? PFO (patent foramen ovale)   ? PFO noted on coronary CT 09/05/14  ? Prostate cancer [facility]) 2009  ? ? ?Past Surgical History:  ?Procedure Laterality Date  ? CATARACT EXTRACTION    ? bilateral and a wrinkle on retina on the left   ? CHOLECYSTECTOMY    ? COLONOSCOPY    ? DIRECT LARYNGOSCOPY N/A 12/17/2020  ? Procedure: DIRECT LARYNGOSCOPY WITH BIOPSIES;  Surgeon: Izora Gala, MD;  Location: [place];  Service: ENT;  Laterality: N/A;  ? ESOPHAGOSCOPY N/A 12/17/2020  ? Procedure: ESOPHAGOSCOPY;  Surgeon: Izora Gala, MD;  Location: [place];  Service: ENT;  Laterality: N/A;  ? IR GASTROSTOMY TUBE MOD SED  01/28/2021  ? IR IMAGING GUIDED PORT INSERTION  01/28/2021  ? PROSTATE SURGERY  2009  ? radiation treatment    ? RADICAL NECK DISSECTION Left 12/17/2020  ? Procedure: LEFT NECK DISSECTION;  Surgeon: Izora Gala, MD;  Location: [place];  Service: ENT;  Laterality: Left;  ? RESECTION OF MEDIASTINAL MASS N/A 02/26/2015  ? Procedure: RESECTION OF MEDIASTINAL MASS;  Surgeon: Ivin Poot, MD;  Location: [place];  Service:  Thoracic;  Laterality: N/A;  ? STERNOTOMY N/A 02/26/2015  ? Procedure: STERNOTOMY;  Surgeon: Ivin Poot, MD;  Location: [place];  Service: Thoracic;  Laterality: N/A;  ? UPPER GASTROINTESTINAL ENDOSCOPY    ? ? ?There were no vitals filed for this visit. ? ? Subjective Assessment - 07/09/21 1552   ? ? Subjective I didn't have time to practice the massage much because I was out of town.   ? 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 The PET demonstrated enlarged level 2 lymph nodes with minimal uptake concerning for neoplasm, 10/05/20 left cervical lymph node biopsy revealed poorly differentiated carcinoma, PSA staining was negative, 12/17/20 left neck level 1-3 lymph node biopsies revealing metastatic SCC in 2 of 15 lymph nodes with extracapsular extension present. CT scan on 12/31/20 demonstrated no chest metastasis, will receive 30 fractions of radiation to his Oropharynx and bilateral neck with weekly cisplatin.  He started on 01/29/21 and will complete on 03/13/21, PAC/PEG 01/28/21   ? Patient Stated Goals to gain info from providers   ? Currently  in Pain? No/denies   ? Pain Score 0-No pain   ? ?  ?  ? ?  ? ? ? ? ? ? ? ? ? ? ? ? ? ? ? ? ? ? ? ? [place] Adult PT Treatment/Exercise - 07/09/21 0001   ? ?  ? Manual Therapy  ? Manual Lymphatic Drainage (MLD) in supine with head elevated: short neck, 5 diaphragmatic breaths, bilateral axillary nodes, bilateral pectoral nodes, bilateral chest, short neck, posterior, lateral and anterior neck then retracing all steps   ? ?  ?  ? ?  ? ? ? ? ? ? ? ? ? ? ? ? ? ? ? ? ? ? ? ? ? ? Plan - 07/09/21 1551   ? ? Clinical Impression Statement Continued with MLD to anterior neck to decrease edema. Pt reports he has not had much time to practice the technique because he was out of town. Therapist demonstrated entire  sequence today so pt could learn it. He will practice again at home.   ? PT Frequency 2x / week   ? PT Duration 4 weeks   ? PT Treatment/Interventions ADLs/Self Care Home Management;Therapeutic exercise;Patient/family education;Manual techniques;Manual lymph drainage;Scar mobilization;Passive range of motion;Taping;Vasopneumatic Device   ? PT Next Visit Plan continue MLD to head and neck and instruct using Norton anterior approach, instruct in scar mobilization, hear from [employer]?   ? PT Home Exercise Plan self MLD   ? Consulted and Agree with Plan of Care Patient   ? ?  ?  ? ?  ? ? ?Patient will benefit from skilled therapeutic intervention in order to improve the following deficits and impairments:  Postural dysfunction, Increased fascial restricitons, Decreased range of motion, Decreased scar mobility, Increased edema ? ?Visit Diagnosis: ?Lymphedema, not elsewhere classified ? ?Aftercare following surgery for neoplasm ? ?Abnormal posture ? 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 ? ? ? ?Problem List ?Patient Active Problem List  ? Diagnosis Date Noted  ? History of radiation to head and neck region 05/10/2021  ? 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  ? Trismus 05/10/2021  ? Dysgeusia 05/10/2021  ? Atrial fibrillation with RVR ([place]) 03/05/2021  ? Port-A-Cath in place 02/05/2021  ? Head and neck cancer ([place]) 01/01/2021  ? SPK (superficial punctate keratitis), left 12/13/2020  ? Dry eyes, bilateral 12/13/2020  ? 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 Increasing PSA level after treatment for prostate cancer 11/22/2020  ? Macular retinoschisis, left 11/08/2020  ? Left epiretinal membrane 10/17/2019  ? Right epiretinal membrane 10/17/2019  ? Early stage nonexudative age-related macular degeneration of both eyes 10/17/2019  ? Drug-induced gynecomastia 08/14/2015  ? Mediastinal mass 08/09/2014  ? History of prostate cancer 08/09/2014  ? Hypertension 08/09/2014  ? ? ?Allyson Sabal  [place], PT ?07/09/2021, 3:53 PM ? ?[place] ?[facility] ?[place], [place], [address] ?Phone: [phone]   Fax:  [phone] ? ?Name: Nicholas Daniels ?MRN: [id number] ?Date of Birth: 22-Nov-1950 ? ? ? ?

## 2021-07-12 ENCOUNTER — Ambulatory Visit: Payer: PPO | Admitting: Rehabilitation

## 2021-07-12 ENCOUNTER — Encounter: Payer: Self-pay | Admitting: Rehabilitation

## 2021-07-12 DIAGNOSIS — Z483 Aftercare following surgery for neoplasm: Secondary | ICD-10-CM

## 2021-07-12 DIAGNOSIS — I890 Lymphedema, not elsewhere classified: Secondary | ICD-10-CM

## 2021-07-12 DIAGNOSIS — C770 Secondary and unspecified malignant neoplasm of lymph nodes of head, face and neck: Secondary | ICD-10-CM

## 2021-07-12 DIAGNOSIS — R293 Abnormal posture: Secondary | ICD-10-CM

## 2021-07-12 DIAGNOSIS — R1310 Dysphagia, unspecified: Secondary | ICD-10-CM

## 2021-07-12 NOTE — Therapy (Signed)
[place] ?[place] ?[place], [place], [address] ?Phone: [phone]   Fax:  [phone] ? ?Physical Therapy Treatment ? ?Patient Details  ?Name: Nicholas Daniels ?MRN: [id number] ?Date of Birth: [birth year] ?Referring Provider (PT): Isidore Moos ? ? ?Encounter Date: 07/12/2021 ? ? PT End of Session - 07/12/21 0851   ? ? Visit Number 6   ? Number of Visits 11   ? Date for PT Re-Evaluation 07/22/21   ? PT Start Time 0813   ? PT Stop Time 0853   ? PT Time Calculation (min) 40 min   ? Activity Tolerance Patient tolerated treatment well   ? Behavior During Therapy [facility] for tasks assessed/performed   ? ?  ?  ? ?  ? ? ?Past Medical History:  ?Diagnosis Date  ? Atrial fibrillation ([place])   ? Cataract   ? removed bilateraly  ? Coronary artery calcification seen on CT scan   ? Cough   ? Essential hypertension   ? Glaucoma   ? Head and neck cancer ([place])   ? Hypertension   ? PFO (patent foramen ovale)   ? PFO noted on coronary CT 09/05/14  ? Prostate cancer [facility]) 2009  ? ? ?Past Surgical History:  ?Procedure Laterality Date  ? CATARACT EXTRACTION    ? bilateral and a wrinkle on retina on the left   ? CHOLECYSTECTOMY    ? COLONOSCOPY    ? DIRECT LARYNGOSCOPY N/A 12/17/2020  ? Procedure: DIRECT LARYNGOSCOPY WITH BIOPSIES;  Surgeon: Izora Gala, MD;  Location: [place];  Service: ENT;  Laterality: N/A;  ? ESOPHAGOSCOPY N/A 12/17/2020  ? Procedure: ESOPHAGOSCOPY;  Surgeon: Izora Gala, MD;  Location: [place];  Service: ENT;  Laterality: N/A;  ? IR GASTROSTOMY TUBE MOD SED  01/28/2021  ? IR IMAGING GUIDED PORT INSERTION  01/28/2021  ? PROSTATE SURGERY  2009  ? radiation treatment    ? RADICAL NECK DISSECTION Left 12/17/2020  ? Procedure: LEFT NECK DISSECTION;  Surgeon: Izora Gala, MD;  Location: [place];  Service: ENT;  Laterality: Left;  ? RESECTION OF MEDIASTINAL MASS N/A 02/26/2015  ? Procedure: RESECTION OF MEDIASTINAL MASS;  Surgeon: Ivin Poot, MD;  Location: [place];  Service:  Thoracic;  Laterality: N/A;  ? STERNOTOMY N/A 02/26/2015  ? Procedure: STERNOTOMY;  Surgeon: Ivin Poot, MD;  Location: [place];  Service: Thoracic;  Laterality: N/A;  ? UPPER GASTROINTESTINAL ENDOSCOPY    ? ? ?There were no vitals filed for this visit. ? ? Subjective Assessment - 07/12/21 0810   ? ? Subjective The pump people sent me some paperwork and I filled it out and sent it back   ? 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 The PET demonstrated enlarged level 2 lymph nodes with minimal uptake concerning for neoplasm, 10/05/20 left cervical lymph node biopsy revealed poorly differentiated carcinoma, PSA staining was negative, 12/17/20 left neck level 1-3 lymph node biopsies revealing metastatic SCC in 2 of 15 lymph nodes with extracapsular extension present. CT scan on 12/31/20 demonstrated no chest metastasis, will receive 30 fractions of radiation to his Oropharynx and bilateral neck with weekly cisplatin.  He started on 01/29/21 and will complete on 03/13/21, PAC/PEG 01/28/21   ? Currently in Pain? No/denies   ? ?  ?  ? ?  ? ? ? ? ? ? ? ? ? ? ? ? ? ? ? ? ? ? ? ?  [place] Adult PT Treatment/Exercise - 07/12/21 0001   ? ?  ? Manual Therapy  ? Manual Lymphatic Drainage (MLD) in supine with head elevated: short neck, 5 diaphragmatic breaths, bilateral axillary nodes, bilateral pectoral nodes, bilateral chest, short neck, posterior, lateral and anterior neck then retracing all steps   ? ?  ?  ? ?  ? ? ? ? ? ? ? ? ? ? ? ? ? ? ? ? ? ? ? ? ? ? Plan - 07/12/21 0851   ? ? Clinical Impression Statement Continued MLD to anterior neck to with general soft edema.   ? PT Treatment/Interventions ADLs/Self Care Home Management;Therapeutic exercise;Patient/family education;Manual techniques;Manual lymph drainage;Scar mobilization;Passive range of  motion;Taping;Vasopneumatic Device   ? PT Next Visit Plan continue MLD to head and neck and instruct using Norton anterior approach, instruct in scar mobilization, hear from [employer]?   ? Consulted and Agree with Plan of Care Patient   ? ?  ?  ? ?  ? ? ?Patient will benefit from skilled therapeutic intervention in order to improve the following deficits and impairments:    ? ?Visit Diagnosis: ?Lymphedema, not elsewhere classified ? ?Aftercare following surgery for neoplasm ? ?Abnormal posture ? 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 ?Dysphagia, unspecified type ? ? ? ? ?Problem List ?Patient Active Problem List  ? Diagnosis Date Noted  ? History of radiation to head and neck region 05/10/2021  ? 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  ? Trismus 05/10/2021  ? Dysgeusia 05/10/2021  ? Atrial fibrillation with RVR ([place]) 03/05/2021  ? Port-A-Cath in place 02/05/2021  ? Head and neck cancer ([place]) 01/01/2021  ? SPK (superficial punctate keratitis), left 12/13/2020  ? Dry eyes, bilateral 12/13/2020  ? 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 Increasing PSA level after treatment for prostate cancer 11/22/2020  ? Macular retinoschisis, left 11/08/2020  ? Left epiretinal membrane 10/17/2019  ? Right epiretinal membrane 10/17/2019  ? Early stage nonexudative age-related macular degeneration of both eyes 10/17/2019  ? Drug-induced gynecomastia 08/14/2015  ? Mediastinal mass 08/09/2014  ? History of prostate cancer 08/09/2014  ? Hypertension 08/09/2014  ? ? ?Stark Bray, PT ?07/12/2021, 8:54 AM ? ?[place] ?[facility] ?[place], [place], [address] ?Phone: [phone]   Fax:  [phone] ? ?Name: Nicholas Daniels ?MRN: [id number] ?Date of Birth: 08-04-1950 ? ? ? ?

## 2021-07-16 ENCOUNTER — Encounter: Payer: Self-pay | Admitting: Physical Therapy

## 2021-07-16 ENCOUNTER — Ambulatory Visit: Payer: PPO | Admitting: Physical Therapy

## 2021-07-16 DIAGNOSIS — Z483 Aftercare following surgery for neoplasm: Secondary | ICD-10-CM

## 2021-07-16 DIAGNOSIS — I890 Lymphedema, not elsewhere classified: Secondary | ICD-10-CM | POA: Diagnosis not present

## 2021-07-16 DIAGNOSIS — C770 Secondary and unspecified malignant neoplasm of lymph nodes of head, face and neck: Secondary | ICD-10-CM

## 2021-07-16 DIAGNOSIS — R293 Abnormal posture: Secondary | ICD-10-CM

## 2021-07-16 NOTE — Therapy (Signed)
[place] ?[place] [facility] ?[place], [place], [address] ?Phone: [phone]   Fax:  [phone] ? ?Physical Therapy Treatment ? ?Patient Details  ?Name: Nicholas Daniels ?MRN: [id number] ?Date of Birth: 1950/06/02 ?Referring Provider (PT): Isidore Moos ? ? ?Encounter Date: 07/16/2021 ? ? PT End of Session - 07/16/21 1003   ? ? Visit Number 7   ? Number of Visits 11   ? Date for PT Re-Evaluation 07/22/21   ? PT Start Time 1003   ? PT Stop Time 1055   ? PT Time Calculation (min) 52 min   ? Activity Tolerance Patient tolerated treatment well   ? Behavior During Therapy [facility] for tasks assessed/performed   ? ?  ?  ? ?  ? ? ?Past Medical History:  ?Diagnosis Date  ? Atrial fibrillation ([place])   ? Cataract   ? removed bilateraly  ? Coronary artery calcification seen on CT scan   ? Cough   ? Essential hypertension   ? Glaucoma   ? Head and neck cancer ([place])   ? Hypertension   ? PFO (patent foramen ovale)   ? PFO noted on coronary CT 09/05/14  ? Prostate cancer [facility]) 2009  ? ? ?Past Surgical History:  ?Procedure Laterality Date  ? CATARACT EXTRACTION    ? bilateral and a wrinkle on retina on the left   ? CHOLECYSTECTOMY    ? COLONOSCOPY    ? DIRECT LARYNGOSCOPY N/A 12/17/2020  ? Procedure: DIRECT LARYNGOSCOPY WITH BIOPSIES;  Surgeon: Izora Gala, MD;  Location: [place];  Service: ENT;  Laterality: N/A;  ? ESOPHAGOSCOPY N/A 12/17/2020  ? Procedure: ESOPHAGOSCOPY;  Surgeon: Izora Gala, MD;  Location: [place];  Service: ENT;  Laterality: N/A;  ? IR GASTROSTOMY TUBE MOD SED  01/28/2021  ? IR IMAGING GUIDED PORT INSERTION  01/28/2021  ? PROSTATE SURGERY  2009  ? radiation treatment    ? RADICAL NECK DISSECTION Left 12/17/2020  ? Procedure: LEFT NECK DISSECTION;  Surgeon: Izora Gala, MD;  Location: [place];  Service: ENT;  Laterality: Left;  ? RESECTION OF MEDIASTINAL MASS N/A 02/26/2015  ? Procedure: RESECTION OF MEDIASTINAL MASS;  Surgeon: Ivin Poot, MD;  Location: [place];  Service:  Thoracic;  Laterality: N/A;  ? STERNOTOMY N/A 02/26/2015  ? Procedure: STERNOTOMY;  Surgeon: Ivin Poot, MD;  Location: [place];  Service: Thoracic;  Laterality: N/A;  ? UPPER GASTROINTESTINAL ENDOSCOPY    ? ? ?There were no vitals filed for this visit. ? ? Subjective Assessment - 07/16/21 1006   ? ? Subjective The neck is still puffy. The device was approved they said. They will call me as soon as it ships.   ? 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 The PET demonstrated enlarged level 2 lymph nodes with minimal uptake concerning for neoplasm, 10/05/20 left cervical lymph node biopsy revealed poorly differentiated carcinoma, PSA staining was negative, 12/17/20 left neck level 1-3 lymph node biopsies revealing metastatic SCC in 2 of 15 lymph nodes with extracapsular extension present. CT scan on 12/31/20 demonstrated no chest metastasis, will receive 30 fractions of radiation to his Oropharynx and bilateral neck with weekly cisplatin.  He started on 01/29/21 and will complete on 03/13/21, PAC/PEG 01/28/21   ? Patient Stated Goals to gain info from  providers   ? Currently in Pain? No/denies   ? Pain Score 0-No pain   ? ?  ?  ? ?  ? ? ? ? ? ? ? ? ? ? ? ? ? ? ? ? ? ? ? ? [place] Adult PT Treatment/Exercise - 07/16/21 0001   ? ?  ? Manual Therapy  ? Manual Lymphatic Drainage (MLD) seated edge of bed - had pt follow handout and demonstrate the following: short neck, 5 diaphragmatic breaths, bilateral axillary nodes, bilateral pectoral nodes, bilateral chest, short neck, posterior, lateral and anterior neck while having pt demonstrate all steps and therapist provided appropriate verbal and tactile cues for correct direction of stretch, correct skin stretch technique and proper sequence. Then had pt lie in supine and therapist worked on MLD to anterior  neck and retraced all steps   ? ?  ?  ? ?  ? ? ? ? ? ? ? ? ? ? ? ? ? ? ? ? ? ? ? ? ? ? Plan - 07/16/21 1058   ? ? Clinical Impression Statement Continued to instruct pt in self MLD and observed pt and provided appropriate verbal and tactile cues for pt to be independent in self MLD. Pt was able to demonstrate correct technique by end of session. Pt reports he heard from [employer] and was approved after sending in his form. He is waiting for his device to ship. Therapist performed MLD for the last bit of the session. He continues to present with increased fullness at medial anterior neck that is soft and not fibrotic.   ? PT Treatment/Interventions ADLs/Self Care Home Management;Therapeutic exercise;Patient/family education;Manual techniques;Manual lymph drainage;Scar mobilization;Passive range of motion;Taping;Vasopneumatic Device   ? PT Next Visit Plan continue MLD to head and neck and instruct using Norton anterior approach, instruct in scar mobilization, hear from [employer]?   ? PT Home Exercise Plan self MLD   ? Consulted and Agree with Plan of Care Patient   ? ?  ?  ? ?  ? ? ?Patient will benefit from skilled therapeutic intervention in order to improve the following deficits and impairments:  Postural dysfunction, Increased fascial restricitons, Decreased range of motion, Decreased scar mobility, Increased edema ? ?Visit Diagnosis: ?Lymphedema, not elsewhere classified ? ?Aftercare following surgery for neoplasm ? ?Abnormal posture ? 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 ? ? ? ?Problem List ?Patient Active Problem List  ? Diagnosis Date Noted  ? History of radiation to head and neck region 05/10/2021  ? 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  ? Trismus 05/10/2021  ? Dysgeusia 05/10/2021  ? Atrial fibrillation with RVR ([place]) 03/05/2021  ? Port-A-Cath in place 02/05/2021  ? Head and neck cancer ([place]) 01/01/2021  ? SPK (superficial punctate keratitis), left 12/13/2020  ? Dry eyes, bilateral 12/13/2020  ?  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 Increasing PSA level after treatment for prostate cancer 11/22/2020  ? Macular retinoschisis, left 11/08/2020  ? Left epiretinal membrane 10/17/2019  ? Right epiretinal membrane 10/17/2019  ? Early stage nonexudative age-related macular degeneration of both eyes 10/17/2019  ? Drug-induced gynecomastia 08/14/2015  ? Mediastinal mass 08/09/2014  ? History of prostate cancer 08/09/2014  ? Hypertension 08/09/2014  ? ? ?Allyson Sabal [place], PT ?07/16/2021, 11:00 AM ? ?[place] ?[place] [facility] ?[place], [place], [address] ?Phone: [phone]   Fax:  [phone] ? ?Name: CORMAC WINT ?MRN: [id number] ?Date of Birth: Oct 15, 1950 ? ? ?  Allyson Sabal [place], PT ?07/16/21 11:00 AM ? ?

## 2021-07-19 ENCOUNTER — Ambulatory Visit: Payer: PPO | Attending: Radiation Oncology | Admitting: Physical Therapy

## 2021-07-19 ENCOUNTER — Encounter: Payer: Self-pay | Admitting: Physical Therapy

## 2021-07-19 DIAGNOSIS — C770 Secondary and unspecified malignant neoplasm of lymph nodes of head, face and neck: Secondary | ICD-10-CM | POA: Diagnosis not present

## 2021-07-19 DIAGNOSIS — R1310 Dysphagia, unspecified: Secondary | ICD-10-CM | POA: Diagnosis not present

## 2021-07-19 DIAGNOSIS — I890 Lymphedema, not elsewhere classified: Secondary | ICD-10-CM | POA: Insufficient documentation

## 2021-07-19 DIAGNOSIS — Z483 Aftercare following surgery for neoplasm: Secondary | ICD-10-CM | POA: Diagnosis not present

## 2021-07-19 DIAGNOSIS — R293 Abnormal posture: Secondary | ICD-10-CM | POA: Diagnosis not present

## 2021-07-19 NOTE — Therapy (Signed)
[place] [facility] ?[place], [place], [address] ?Phone: [phone]   Fax:  [phone] ? ?Physical Therapy Treatment ? ?Patient Details  ?Name: Nicholas Daniels ?MRN: [id number] ?Date of Birth: [birth year] ?Referring Provider (PT): Isidore Moos ? ? ?Encounter Date: 07/19/2021 ? ? PT End of Session - 07/19/21 1111   ? ? Visit Number 8   ? Number of Visits 11   ? Date for PT Re-Evaluation 07/22/21   ? PT Start Time 1110   ? PT Stop Time 1151   ? PT Time Calculation (min) 41 min   ? Activity Tolerance Patient tolerated treatment well   ? Behavior During Therapy [facility] for tasks assessed/performed   ? ?  ?  ? ?  ? ? ?Past Medical History:  ?Diagnosis Date  ? Atrial fibrillation ([place])   ? Cataract   ? removed bilateraly  ? Coronary artery calcification seen on CT scan   ? Cough   ? Essential hypertension   ? Glaucoma   ? Head and neck cancer ([place])   ? Hypertension   ? PFO (patent foramen ovale)   ? PFO noted on coronary CT 09/05/14  ? Prostate cancer [facility]) 2009  ? ? ?Past Surgical History:  ?Procedure Laterality Date  ? CATARACT EXTRACTION    ? bilateral and a wrinkle on retina on the left   ? CHOLECYSTECTOMY    ? COLONOSCOPY    ? DIRECT LARYNGOSCOPY N/A 12/17/2020  ? Procedure: DIRECT LARYNGOSCOPY WITH BIOPSIES;  Surgeon: Izora Gala, MD;  Location: [place];  Service: ENT;  Laterality: N/A;  ? ESOPHAGOSCOPY N/A 12/17/2020  ? Procedure: ESOPHAGOSCOPY;  Surgeon: Izora Gala, MD;  Location: [place];  Service: ENT;  Laterality: N/A;  ? IR GASTROSTOMY TUBE MOD SED  01/28/2021  ? IR IMAGING GUIDED PORT INSERTION  01/28/2021  ? PROSTATE SURGERY  2009  ? radiation treatment    ? RADICAL NECK DISSECTION Left 12/17/2020  ? Procedure: LEFT NECK DISSECTION;  Surgeon: Izora Gala, MD;  Location: [place];  Service: ENT;  Laterality: Left;  ? RESECTION OF MEDIASTINAL MASS N/A 02/26/2015  ? Procedure: RESECTION OF MEDIASTINAL MASS;  Surgeon: Ivin Poot, MD;  Location: [place];  Service:  Thoracic;  Laterality: N/A;  ? STERNOTOMY N/A 02/26/2015  ? Procedure: STERNOTOMY;  Surgeon: Ivin Poot, MD;  Location: [place];  Service: Thoracic;  Laterality: N/A;  ? UPPER GASTROINTESTINAL ENDOSCOPY    ? ? ?There were no vitals filed for this visit. ? ? Subjective Assessment - 07/19/21 1154   ? ? Subjective The self massage is going so so.   ? 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 The PET demonstrated enlarged level 2 lymph nodes with minimal uptake concerning for neoplasm, 10/05/20 left cervical lymph node biopsy revealed poorly differentiated carcinoma, PSA staining was negative, 12/17/20 left neck level 1-3 lymph node biopsies revealing metastatic SCC in 2 of 15 lymph nodes with extracapsular extension present. CT scan on 12/31/20 demonstrated no chest metastasis, will receive 30 fractions of radiation to his Oropharynx and bilateral neck with weekly cisplatin.  He started on 01/29/21 and will complete on 03/13/21, PAC/PEG 01/28/21   ? Patient Stated Goals to gain info from providers   ? Currently in Pain? No/denies   ? Pain Score  0-No pain   ? ?  ?  ? ?  ? ? ? ? ? ? ? ? ? ? ? ? ? ? ? ? ? ? ? ? [place] Adult PT Treatment/Exercise - 07/19/21 0001   ? ?  ? Manual Therapy  ? Manual Lymphatic Drainage (MLD) in supine with head elevated: short neck, 5 diaphragmatic breaths, bilateral axillary nodes, bilateral pectoral nodes, bilateral chest, short neck, posterior, lateral and anterior neck then retracing all steps   ? ?  ?  ? ?  ? ? ? ? ? ? ? ? ? ? ? ? ? ? ? ? ? ? ? ? ? ? Plan - 07/19/21 1158   ? ? Clinical Impression Statement Pt reports he has been trying the self massage at home. He reports he still gets confused on which direction to move the fluid but consults his instructions. Continued with MLD to anterior neck. He was approved for the  [place] and is just waiting for it to ship out to him. He would benefit from a long term compression garment so will discuss different options for this at next session.   ? PT Frequency 2x / week   ? PT Duration 4 weeks   ? PT Treatment/Interventions ADLs/Self Care Home Management;Therapeutic exercise;Patient/family education;Manual techniques;Manual lymph drainage;Scar mobilization;Passive range of motion;Taping;Vasopneumatic Device   ? PT Next Visit Plan decide on long term compression garment, continue MLD to head and neck and instruct using Norton anterior approach, instruct in scar mobilization, hear from [facility]?   ? PT Home Exercise Plan self MLD   ? Consulted and Agree with Plan of Care Patient   ? ?  ?  ? ?  ? ? ?Patient will benefit from skilled therapeutic intervention in order to improve the following deficits and impairments:  Postural dysfunction, Increased fascial restricitons, Decreased range of motion, Decreased scar mobility, Increased edema ? ?Visit Diagnosis: ?Lymphedema, not elsewhere classified ? ?Aftercare following surgery for neoplasm ? ?Abnormal posture ? 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 ? ? ? ?Problem List ?Patient Active Problem List  ? Diagnosis Date Noted  ? History of radiation to head and neck region 05/10/2021  ? 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  ? Trismus 05/10/2021  ? Dysgeusia 05/10/2021  ? Atrial fibrillation with RVR ([place]) 03/05/2021  ? Port-A-Cath in place 02/05/2021  ? Head and neck cancer ([place]) 01/01/2021  ? SPK (superficial punctate keratitis), left 12/13/2020  ? Dry eyes, bilateral 12/13/2020  ? 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 Increasing PSA level after treatment for prostate cancer 11/22/2020  ? Macular retinoschisis, left 11/08/2020  ? Left epiretinal membrane 10/17/2019  ? Right epiretinal membrane 10/17/2019  ? Early stage nonexudative age-related macular degeneration of both eyes 10/17/2019  ? Drug-induced  gynecomastia 08/14/2015  ? Mediastinal mass 08/09/2014  ? History of prostate cancer 08/09/2014  ? Hypertension 08/09/2014  ? ? ?Allyson Sabal Brainards, PT ?07/19/2021, 12:00 PM ? ?[place] ?[facility] ?[place], [place], [address] ?Phone: [phone]   Fax:  [phone] ? ?Name: Nicholas Daniels ?MRN: [id number] ?Date of Birth: 02-11-1951 ? ? ? ?

## 2021-07-22 ENCOUNTER — Other Ambulatory Visit: Payer: Self-pay

## 2021-07-22 ENCOUNTER — Inpatient Hospital Stay: Payer: PPO | Attending: Oncology | Admitting: Nutrition

## 2021-07-22 ENCOUNTER — Ambulatory Visit: Payer: PPO

## 2021-07-22 DIAGNOSIS — I890 Lymphedema, not elsewhere classified: Secondary | ICD-10-CM | POA: Diagnosis not present

## 2021-07-22 DIAGNOSIS — R1310 Dysphagia, unspecified: Secondary | ICD-10-CM

## 2021-07-22 NOTE — Progress Notes (Signed)
Patient was seen for nutrition follow-up status post chemoradiation for SCC of oropharynx.  He is a patient of Dr. Alen Blew.  Patient had a PEG placed on January 28, 2021 and finishes final radiation therapy on March 12, 2021. ? ?Weight documented as 177.8 pounds today which is decreased from 191 pounds on December 22.  He weighed 182 pounds on March 29. ? ?Patient reports he is no longer using his feeding tube for nutrition support.  He will drink Carnation breakfast essentials sporadically.  He is mixing this with skim milk.  He has added ice cream and banana to make a milkshake.  He reports his biggest challenge is the taste of food.  States everything tastes very salty.  He is forcing himself to eat most things.  He still has difficulty swallowing bread. ? ?Nutrition diagnosis: Inadequate oral intake continues. ? ?Intervention: ?Provided support and encouragement to patient for increasing oral intake. ?Encouraged Ensure Plus or equivalent twice daily to provide an additional 700 cal daily.  Provided multiple samples. ?Recommended patient switch from skim milk to whole milk for Carnation breakfast. ?Encourage patient to continue rinsing his mouth out with baking soda and salt water. ?Reviewed other strategies to improve salty taste.  Nutrition fact sheet provided. ? ?Monitoring, evaluation, goals: ?Patient will tolerate increased calories and protein to minimize further weight loss. ? ?Next visit: Tuesday, May 30 after port flush. ? ?**Disclaimer: This note was dictated with voice recognition software. Similar sounding words can inadvertently be transcribed and this note may contain transcription errors which may not have been corrected upon publication of note.** ? ?

## 2021-07-22 NOTE — Patient Instructions (Signed)
SWALLOWING EXERCISES ?Do these until 6 months after your last day of radiation, then 2-3 times per week afterwards ? ?Effortful Swallows ?- Press your tongue against the roof of your mouth for 3 seconds, then squeeze the muscles in your neck while you swallow your saliva or a sip of water ?- Repeat 10-15 times, 2-3 times a day, and use whenever you eat or drink ? ?[place]ako Swallow - swallow with your tongue sticking out ?- Stick tongue out past your lips and gently bite tongue with your teeth ?- Swallow, while holding your tongue with your teeth ?- Repeat 10-15 times, 2-3 times a day ?*use a wet spoon if your mouth gets dry* ? ?Shaker Exercise - head lift ?- Lie flat on your back in your bed or on a couch without pillows ?- Raise your head and look at your feet - KEEP YOUR SHOULDERS DOWN ?- HOLD FOR 45-60 SECONDS, then lower your head back down ?- Repeat 3 times, 2-3 times a day ? ?Mendelsohn Maneuver - ?half swallow? exercise ?- Start to swallow, and keep your Adam?s apple up by squeezing hard with the muscles of the throat ?- Hold the squeeze for 5-7 seconds and then relax ?- Repeat 10-15 times, 2-3 times a day ?*use a wet spoon if your mouth gets dry* ? ?Chin pushback ?- Open your mouth  ?- Place your fist UNDER your chin near your neck ?- Tuck your chin and push back with your fist for 5 seconds ?- Repeat 10 times, 2-3 times a day ? ?       6. "Super Swallow" ? - Take a breath and hold it ? - Bear down (like pushing your bowels) ? - Swallow then IMMEDIATELY cough ? - Repeat 10 times, 2-3 times a day ? ?

## 2021-07-22 NOTE — Therapy (Signed)
[place] ?[facility] ?[address], [address] ?[place], [place], [address] ?Phone: [phone]   Fax:  [phone] ? ?Speech Language Pathology Treatment/Renewal Summary ? ?Patient Details  ?Name: Nicholas Daniels ?MRN: [id number] ?Date of Birth: September 27, 1950 ?Referring Provider (SLP): Eppie Gibson, MD ? ? ?Encounter Date: 07/22/2021 ? ? End of Session - 07/22/21 1434   ? ? Visit Number 5   ? Number of Visits 6   ? Date for SLP Re-Evaluation 10/20/21   ? SLP Start Time 1357   ? SLP Stop Time  1429   ? SLP Time Calculation (min) 32 min   ? Activity Tolerance Patient tolerated treatment well   ? ?  ?  ? ?  ? ? ?Past Medical History:  ?Diagnosis Date  ? Atrial fibrillation ([place])   ? Cataract   ? removed bilateraly  ? Coronary artery calcification seen on CT scan   ? Cough   ? Essential hypertension   ? Glaucoma   ? Head and neck cancer ([place])   ? Hypertension   ? PFO (patent foramen ovale)   ? PFO noted on coronary CT 09/05/14  ? Prostate cancer [facility]) 2009  ? ? ?Past Surgical History:  ?Procedure Laterality Date  ? CATARACT EXTRACTION    ? bilateral and a wrinkle on retina on the left   ? CHOLECYSTECTOMY    ? COLONOSCOPY    ? DIRECT LARYNGOSCOPY N/A 12/17/2020  ? Procedure: DIRECT LARYNGOSCOPY WITH BIOPSIES;  Surgeon: Izora Gala, MD;  Location: [place];  Service: ENT;  Laterality: N/A;  ? ESOPHAGOSCOPY N/A 12/17/2020  ? Procedure: ESOPHAGOSCOPY;  Surgeon: Izora Gala, MD;  Location: [place];  Service: ENT;  Laterality: N/A;  ? IR GASTROSTOMY TUBE MOD SED  01/28/2021  ? IR IMAGING GUIDED PORT INSERTION  01/28/2021  ? PROSTATE SURGERY  2009  ? radiation treatment    ? RADICAL NECK DISSECTION Left 12/17/2020  ? Procedure: LEFT NECK DISSECTION;  Surgeon: Izora Gala, MD;  Location: [place];  Service: ENT;  Laterality: Left;  ? RESECTION OF MEDIASTINAL MASS N/A 02/26/2015  ? Procedure: RESECTION OF MEDIASTINAL MASS;  Surgeon: Ivin Poot, MD;  Location: [place];  Service: Thoracic;  Laterality: N/A;  ?  STERNOTOMY N/A 02/26/2015  ? Procedure: STERNOTOMY;  Surgeon: Ivin Poot, MD;  Location: [place];  Service: Thoracic;  Laterality: N/A;  ? UPPER GASTROINTESTINAL ENDOSCOPY    ? ? ?There were no vitals filed for this visit. ? ? Subjective Assessment - 07/22/21 1359   ? ? Subjective "I haven't put any liquid down that tube about 3 weeks." "I don't know where my (HEP)  sheet is - can you give me another one?"   ? Currently in Pain? No/denies   ? ?  ?  ? ?  ? ? ? ? ? ? ? ? ADULT SLP TREATMENT - 07/22/21 1401   ? ?  ? General Information  ? Behavior/Cognition Alert;Cooperative;Pleasant mood   ?  ? Treatment Provided  ? Treatment provided Dysphagia   ?  ? Dysphagia Treatment  ? Temperature Spikes Noted No   ? Respiratory Status Room air   ? Treatment Methods Skilled observation;Therapeutic exercise;Compensation strategy training;Patient/caregiver education   ? Patient observed directly with PO's Yes   ? Type of PO's observed Dysphagia 3 (soft);Thin liquids   ? Liquids provided via Cup   ? Oral Phase Signs & Symptoms Other (comment)   nothing noted - pocketing no longer occurs  ?  Pharyngeal Phase Signs & Symptoms Other (comment)   no overt s/sx pharyngeal difficulty  ? Other treatment/comments In last 72 hours pt had tacos, mashed potatoes, fried chicken, toaster pastries, chick fil a, peas and corn. He reports coughing is "no different than before." HEP has not been completed with more frequency than previous session, approx once/week. Pt stated that he will perform HEP more now that he is nearing the end of PT for lymphedema. SLP reiterated rationale for HEP BID - pt demo'd understanding with verbal acknowledgment.   ?  ? Dysphagia Recommendations  ? Diet recommendations Regular;Thin liquid   ? Liquids provided via Cup   ? Medication Administration Other (Comment)   as tolerated  ? ?  ?  ? ?  ? ? ?Short Term Goals 07-22-21 ?       ?SLP SHORT TERM GOAL #1    ?  Title pt will complete HEP with rare min A  ?  Time    ?   Period --   vists, for all STGs  ?  Status Achieved  ?       ?  SLP SHORT TERM GOAL #2  ?  Title pt will tell SLP why pt is completing HEP with modified independence  ?  Time    ?  Status Achieved  ?       ?  SLP SHORT TERM GOAL #3  ?  Title pt will describe 3 overt s/s aspiration PNA with modified independence  ?  Time    ?  Status Achieved  ?       ?  SLP SHORT TERM GOAL #4  ?  Title pt will tell SLP how a food journal could hasten return to a more normalized diet  ?  Time    ?  Status Deferred - pt beginning to eat all consistencies now  ?  ?   ?  ?  ?   ?  ?     ?Long Term Goals 07/22/21 ?       ?       ?  SLP LONG TERM GOAL #1  ?  Title pt will complete HEP with modified independence over three visits  ?  Time 2   03-26-21, 05-21-21  ?  Period --   visits, for all LTGs  ?  Status Ongoing  ?      ?  SLP LONG TERM GOAL #2  ?  Title pt will describe how to modify HEP over time, and the timeline associated with reduction in HEP frequency with modified independence over two sessions  ?  Time 2  ?  Status Ongoing  ?   ?  ?Plan - 07/22/21   ?  ?  Clinical Impression Statement At this time pt swallowing is deemed WNL/WFL with dys III/regular diet and thin liquids. SLP reviewed pt's individualized HEP for dysphagia and pt completed each exercise on their own with independence. Pt is not completing HEP to recommended frequency There are no overt s/s aspiration reported by pt at this time. Data indicate that pt's swallow ability will likely decrease over the course of radiation/chemotherapy and could very well decline over time following conclusion of their radiation therapy due to muscle disuse atrophy and/or muscle fibrosis.  ?Pt will cont to need to be seen by SLP every eight weeks, in order to assess safety of PO intake, assess the need for recommending any objective swallow assessment, and ensuring pt correctly completes the  individualized HEP. Today pt's HEP performed independently and pt was safe with POs.  ?   Speech Therapy Frequency --   once approx every eight weeks  ?  Duration --   90 days for this reporting period; overall, approx 7 visits  ?  Treatment/Interventions Aspiration precaution training;Pharyngeal strengthening exercises;Diet toleration management by SLP;Trials of upgraded texture/liquids;Patient/family education;SLP instruction and feedback;Compensatory techniques  ?  Potential to Achieve Goals Good  ?  SLP Home Exercise Plan provided  ? ? ? ? ? ? ? ? ?Patient will benefit from skilled therapeutic intervention in order to improve the following deficits and impairments:   ?Dysphagia, unspecified type ? ? ? ?Problem List ?Patient Active Problem List  ? Diagnosis Date Noted  ? History of radiation to head and neck region 05/10/2021  ? 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  ? Trismus 05/10/2021  ? Dysgeusia 05/10/2021  ? Atrial fibrillation with RVR ([place]) 03/05/2021  ? Port-A-Cath in place 02/05/2021  ? Head and neck cancer ([place]) 01/01/2021  ? SPK (superficial punctate keratitis), left 12/13/2020  ? Dry eyes, bilateral 12/13/2020  ? 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 Increasing PSA level after treatment for prostate cancer 11/22/2020  ? Macular retinoschisis, left 11/08/2020  ? Left epiretinal membrane 10/17/2019  ? Right epiretinal membrane 10/17/2019  ? Early stage nonexudative age-related macular degeneration of both eyes 10/17/2019  ? Drug-induced gynecomastia 08/14/2015  ? Mediastinal mass 08/09/2014  ? History of prostate cancer 08/09/2014  ? Hypertension 08/09/2014  ? ? ?[place], [place] ?07/22/2021, 2:35 PM ? ?[place] ?[facility] ?[address], [address] ?[place], [place], [address] ?Phone: [phone]   Fax:  [phone] ? ? ?Name: LANKFORD GUTZMER ?MRN: [id number] ?Date of Birth: 1950-05-30 ? ?

## 2021-07-23 ENCOUNTER — Encounter: Payer: Self-pay | Admitting: Physical Therapy

## 2021-07-23 ENCOUNTER — Ambulatory Visit: Payer: PPO | Admitting: Physical Therapy

## 2021-07-23 DIAGNOSIS — I890 Lymphedema, not elsewhere classified: Secondary | ICD-10-CM

## 2021-07-23 DIAGNOSIS — R293 Abnormal posture: Secondary | ICD-10-CM

## 2021-07-23 DIAGNOSIS — C770 Secondary and unspecified malignant neoplasm of lymph nodes of head, face and neck: Secondary | ICD-10-CM

## 2021-07-23 DIAGNOSIS — Z483 Aftercare following surgery for neoplasm: Secondary | ICD-10-CM

## 2021-07-23 NOTE — Therapy (Signed)
[place] ?[facility] ?[place], [place], [address] ?Phone: [phone]   Fax:  [phone] ? ?Physical Therapy Treatment ? ?Patient Details  ?Name: Nicholas Daniels ?MRN: [id number] ?Date of Birth: 1950-12-08 ?Referring Provider (PT): Isidore Moos ? ? ?Encounter Date: 07/23/2021 ? ? PT End of Session - 07/23/21 1003   ? ? Visit Number 9   ? Number of Visits 17   ? Date for PT Re-Evaluation 08/20/21   ? PT Start Time 1003   ? PT Stop Time 1050   ? PT Time Calculation (min) 47 min   ? Activity Tolerance Patient tolerated treatment well   ? Behavior During Therapy [facility] for tasks assessed/performed   ? ?  ?  ? ?  ? ? ?Past Medical History:  ?Diagnosis Date  ? Atrial fibrillation ([place])   ? Cataract   ? removed bilateraly  ? Coronary artery calcification seen on CT scan   ? Cough   ? Essential hypertension   ? Glaucoma   ? Head and neck cancer ([place])   ? Hypertension   ? PFO (patent foramen ovale)   ? PFO noted on coronary CT 09/05/14  ? Prostate cancer [facility]) 2009  ? ? ?Past Surgical History:  ?Procedure Laterality Date  ? CATARACT EXTRACTION    ? bilateral and a wrinkle on retina on the left   ? CHOLECYSTECTOMY    ? COLONOSCOPY    ? DIRECT LARYNGOSCOPY N/A 12/17/2020  ? Procedure: DIRECT LARYNGOSCOPY WITH BIOPSIES;  Surgeon: Izora Gala, MD;  Location: [place];  Service: ENT;  Laterality: N/A;  ? ESOPHAGOSCOPY N/A 12/17/2020  ? Procedure: ESOPHAGOSCOPY;  Surgeon: Izora Gala, MD;  Location: [place];  Service: ENT;  Laterality: N/A;  ? IR GASTROSTOMY TUBE MOD SED  01/28/2021  ? IR IMAGING GUIDED PORT INSERTION  01/28/2021  ? PROSTATE SURGERY  2009  ? radiation treatment    ? RADICAL NECK DISSECTION Left 12/17/2020  ? Procedure: LEFT NECK DISSECTION;  Surgeon: Izora Gala, MD;  Location: [place];  Service: ENT;  Laterality: Left;  ? RESECTION OF MEDIASTINAL MASS N/A 02/26/2015  ? Procedure: RESECTION OF MEDIASTINAL MASS;  Surgeon: Ivin Poot, MD;  Location: [place];  Service:  Thoracic;  Laterality: N/A;  ? STERNOTOMY N/A 02/26/2015  ? Procedure: STERNOTOMY;  Surgeon: Ivin Poot, MD;  Location: [place];  Service: Thoracic;  Laterality: N/A;  ? UPPER GASTROINTESTINAL ENDOSCOPY    ? ? ?There were no vitals filed for this visit. ? ? Subjective Assessment - 07/23/21 1103   ? ? Subjective I am still trying the self massage but I lost my paper.   ? 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 The PET demonstrated enlarged level 2 lymph nodes with minimal uptake concerning for neoplasm, 10/05/20 left cervical lymph node biopsy revealed poorly differentiated carcinoma, PSA staining was negative, 12/17/20 left neck level 1-3 lymph node biopsies revealing metastatic SCC in 2 of 15 lymph nodes with extracapsular extension present. CT scan on 12/31/20 demonstrated no chest metastasis, will receive 30 fractions of radiation to his Oropharynx and bilateral neck with weekly cisplatin.  He started on 01/29/21 and will complete on 03/13/21, PAC/PEG 01/28/21   ? Patient Stated Goals to gain info from providers   ? Currently in Pain? No/denies   ?  Pain Score 0-No pain   ? ?  ?  ? ?  ? ? ? ? ? ? ? ? ? ? ? ? ? ? ? ? ? ? ? ? [place] Adult PT Treatment/Exercise - 07/23/21 0001   ? ?  ? Manual Therapy  ? Manual therapy comments issued info to pt to obtain solaris head and neck garment   ? Manual Lymphatic Drainage (MLD) seated edge of bed - had pt follow handout and demonstrate the following: short neck, 5 diaphragmatic breaths, bilateral axillary nodes, bilateral pectoral nodes, bilateral chest, short neck, posterior, lateral and anterior neck while having pt demonstrate all steps and therapist provided appropriate verbal cues for correct direction of stretch, correct skin stretch technique and proper sequence. Then had pt lie in supine and  therapist worked on MLD to anterior neck and retraced all steps   ? ?  ?  ? ?  ? ? ? ? ? ? ? ? ? ? ? ? ? ? ? PT Long Term Goals - 07/23/21 1101   ? ?  ? PT LONG TERM GOAL #1  ? Title Pt will be independent in self MLD for long term management of lymphedema.   ? Baseline 07/23/21- progressing towards indep but still requring v/c   ? Time 4   ? Period Weeks   ? Status On-going   ? Target Date 08/20/21   ?  ? PT LONG TERM GOAL #2  ? Title Pt will obtain appropriate compression garment for long term management of lymphedema.   ? Baseline 07/23/21- pt issued info to obtain solaris head and neck garment   ? Time 4   ? Period Weeks   ? Status On-going   ? ?  ?  ? ?  ? ? ? ? ? ? ? ? Plan - 07/23/21 1058   ? ? Clinical Impression Statement Pt is demonstrating improved ability to do self MLD. He required less verbal cues today for correct skin stretch technique and direction of stretch. He is more familiar with the handout and is able to follow the instructioins in order to demonstrate the correct direction of the skin stretch. He has not obtained a long term compression garment so he was issued info on obtaining the solaris swell spot for long term management. He was approved for the [place] but it has not been shipped yet. Pt would benefit from additional skilled PT session to progress towards independence with self MLD, assist him with donning/doffing of compression garment once he receives it to allow pt to manage his lymphedema independently.   ? PT Frequency 2x / week   ? PT Duration 4 weeks   ? PT Treatment/Interventions ADLs/Self Care Home Management;Therapeutic exercise;Patient/family education;Manual techniques;Manual lymph drainage;Scar mobilization;Passive range of motion;Taping;Vasopneumatic Device   ? PT Next Visit Plan did pt order solaris head and neck garment, continue MLD to head and neck and instruct using Norton anterior approach, instruct in scar mobilization, hear from [employer]?   ? PT Home Exercise Plan  self MLD   ? Consulted and Agree with Plan of Care Patient   ? ?  ?  ? ?  ? ? ?Patient will benefit from skilled therapeutic intervention in order to improve the following deficits and impairments:  Postural dysfunction, Increased fascial restricitons, Decreased range of motion, Decreased scar mobility, Increased edema ? ?Visit Diagnosis: ?Lymphedema, not elsewhere classified ? ?Aftercare following surgery for neoplasm ? ?Abnormal posture ? 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 ? ? ? ?  Problem List ?Patient Active Problem List  ? Diagnosis Date Noted  ? History of radiation to head and neck region 05/10/2021  ? 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  ? Trismus 05/10/2021  ? Dysgeusia 05/10/2021  ? Atrial fibrillation with RVR ([place]) 03/05/2021  ? Port-A-Cath in place 02/05/2021  ? Head and neck cancer ([place]) 01/01/2021  ? SPK (superficial punctate keratitis), left 12/13/2020  ? Dry eyes, bilateral 12/13/2020  ? 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 Increasing PSA level after treatment for prostate cancer 11/22/2020  ? Macular retinoschisis, left 11/08/2020  ? Left epiretinal membrane 10/17/2019  ? Right epiretinal membrane 10/17/2019  ? Early stage nonexudative age-related macular degeneration of both eyes 10/17/2019  ? Drug-induced gynecomastia 08/14/2015  ? Mediastinal mass 08/09/2014  ? History of prostate cancer 08/09/2014  ? Hypertension 08/09/2014  ? ? ?Allyson Sabal [place], PT ?07/23/2021, 11:04 AM ? ?[place] ?[facility] ?[place], [place], [address] ?Phone: [phone]   Fax:  [phone] ? ?Name: Nicholas Daniels ?MRN: [id number] ?Date of Birth: [birth year] ? ? ? ?

## 2021-07-26 ENCOUNTER — Encounter: Payer: Self-pay | Admitting: Rehabilitation

## 2021-07-26 ENCOUNTER — Ambulatory Visit: Payer: PPO | Admitting: Rehabilitation

## 2021-07-26 DIAGNOSIS — R293 Abnormal posture: Secondary | ICD-10-CM

## 2021-07-26 DIAGNOSIS — Z483 Aftercare following surgery for neoplasm: Secondary | ICD-10-CM

## 2021-07-26 DIAGNOSIS — I890 Lymphedema, not elsewhere classified: Secondary | ICD-10-CM | POA: Diagnosis not present

## 2021-07-26 DIAGNOSIS — C770 Secondary and unspecified malignant neoplasm of lymph nodes of head, face and neck: Secondary | ICD-10-CM

## 2021-07-26 NOTE — Therapy (Signed)
[place] ?[facility] ?[place], [place], [address] ?Phone: [phone]   Fax:  [phone] ? ?Physical Therapy Treatment ? ?Patient Details  ?Name: Nicholas Daniels ?MRN: [id number] ?Date of Birth: 04-04-1951 ?Referring Provider (PT): Isidore Moos ? ? ?Encounter Date: 07/26/2021 ? ? PT End of Session - 07/26/21 1058   ? ? Visit Number 10   ? Number of Visits 17   ? PT Start Time 1000   ? PT Stop Time 1048   ? PT Time Calculation (min) 48 min   ? Activity Tolerance Patient tolerated treatment well   ? Behavior During Therapy [facility] for tasks assessed/performed   ? ?  ?  ? ?  ? ? ?Past Medical History:  ?Diagnosis Date  ? Atrial fibrillation ([place])   ? Cataract   ? removed bilateraly  ? Coronary artery calcification seen on CT scan   ? Cough   ? Essential hypertension   ? Glaucoma   ? Head and neck cancer ([place])   ? Hypertension   ? PFO (patent foramen ovale)   ? PFO noted on coronary CT 09/05/14  ? Prostate cancer [facility]) 2009  ? ? ?Past Surgical History:  ?Procedure Laterality Date  ? CATARACT EXTRACTION    ? bilateral and a wrinkle on retina on the left   ? CHOLECYSTECTOMY    ? COLONOSCOPY    ? DIRECT LARYNGOSCOPY N/A 12/17/2020  ? Procedure: DIRECT LARYNGOSCOPY WITH BIOPSIES;  Surgeon: Izora Gala, MD;  Location: [place];  Service: ENT;  Laterality: N/A;  ? ESOPHAGOSCOPY N/A 12/17/2020  ? Procedure: ESOPHAGOSCOPY;  Surgeon: Izora Gala, MD;  Location: [place];  Service: ENT;  Laterality: N/A;  ? IR GASTROSTOMY TUBE MOD SED  01/28/2021  ? IR IMAGING GUIDED PORT INSERTION  01/28/2021  ? PROSTATE SURGERY  2009  ? radiation treatment    ? RADICAL NECK DISSECTION Left 12/17/2020  ? Procedure: LEFT NECK DISSECTION;  Surgeon: Izora Gala, MD;  Location: [place];  Service: ENT;  Laterality: Left;  ? RESECTION OF MEDIASTINAL MASS N/A 02/26/2015  ? Procedure: RESECTION OF MEDIASTINAL MASS;  Surgeon: Ivin Poot, MD;  Location: [place];  Service: Thoracic;  Laterality: N/A;  ?  STERNOTOMY N/A 02/26/2015  ? Procedure: STERNOTOMY;  Surgeon: Ivin Poot, MD;  Location: [place];  Service: Thoracic;  Laterality: N/A;  ? UPPER GASTROINTESTINAL ENDOSCOPY    ? ? ?There were no vitals filed for this visit. ? ? Subjective Assessment - 07/26/21 0959   ? ? Subjective hasn't ordered a garment.  still waiting for flexitouch to ship out.   ? 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 The PET demonstrated enlarged level 2 lymph nodes with minimal uptake concerning for neoplasm, 10/05/20 left cervical lymph node biopsy revealed poorly differentiated carcinoma, PSA staining was negative, 12/17/20 left neck level 1-3 lymph node biopsies revealing metastatic SCC in 2 of 15 lymph nodes with extracapsular extension present. CT scan on 12/31/20 demonstrated no chest metastasis, will receive 30 fractions of radiation to his Oropharynx and bilateral neck with weekly cisplatin.  He started on 01/29/21 and will complete on 03/13/21, PAC/PEG 01/28/21   ? Currently in Pain? No/denies   ? ?  ?  ? ?  ? ? ? ? ? ? ? ? ? ? ? ? ? ? ? ? ? ? ? ?  [place] Adult PT Treatment/Exercise - 07/26/21 0001   ? ?  ? Manual Therapy  ? Manual Therapy Soft tissue mobilization   ? Soft tissue mobilization with cocoa butter following and above and below left neck incision without sig restriction but to improve lymphatic flow   ? Manual Lymphatic Drainage (MLD) short neck, 5 diaphragmatic breaths, bilateral axillary nodes, bilateral pectoral nodes, bilateral chest, short neck, posterior, lateral and anterior neck and retraced all steps   ? ?  ?  ? ?  ? ? ? ? ? ? ? ? ? ? ? ? ? ? ? PT Long Term Goals - 07/23/21 1101   ? ?  ? PT LONG TERM GOAL #1  ? Title Pt will be independent in self MLD for long term management of lymphedema.   ? Baseline 07/23/21- progressing towards indep but  still requring v/c   ? Time 4   ? Period Weeks   ? Status On-going   ? Target Date 08/20/21   ?  ? PT LONG TERM GOAL #2  ? Title Pt will obtain appropriate compression garment for long term management of lymphedema.   ? Baseline 07/23/21- pt issued info to obtain solaris head and neck garment   ? Time 4   ? Period Weeks   ? Status On-going   ? ?  ?  ? ?  ? ? ? ? ? ? ? ? Plan - 07/26/21 1059   ? ? Clinical Impression Statement Anterior neck edema decreased compared to last visit with this PT.  Added some scar massage today to improve congestion.  Pt is waiting for flexitouch and considering garment.   ? PT Frequency 2x / week   ? PT Duration 4 weeks   ? PT Treatment/Interventions ADLs/Self Care Home Management;Therapeutic exercise;Patient/family education;Manual techniques;Manual lymph drainage;Scar mobilization;Passive range of motion;Taping;Vasopneumatic Device   ? PT Next Visit Plan did pt order solaris head and neck garment?, continue MLD to head and neck and instruct using Norton anterior approach, instruct in scar mobilization, hear from [place]?   ? Consulted and Agree with Plan of Care Patient   ? ?  ?  ? ?  ? ? ?Patient will benefit from skilled therapeutic intervention in order to improve the following deficits and impairments:  Postural dysfunction, Increased fascial restricitons, Decreased range of motion, Decreased scar mobility, Increased edema ? ?Visit Diagnosis: ?Lymphedema, not elsewhere classified ? ?Aftercare following surgery for neoplasm ? ?Abnormal posture ? 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 ? ? ? ?Problem List ?Patient Active Problem List  ? Diagnosis Date Noted  ? History of radiation to head and neck region 05/10/2021  ? 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  ? Trismus 05/10/2021  ? Dysgeusia 05/10/2021  ? Atrial fibrillation with RVR ([place]) 03/05/2021  ? Port-A-Cath in place 02/05/2021  ? Head and neck cancer ([place]) 01/01/2021  ? SPK (superficial punctate keratitis), left 12/13/2020   ? Dry eyes, bilateral 12/13/2020  ? 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 Increasing PSA level after treatment for prostate cancer 11/22/2020  ? Macular retinoschisis, left 11/08/2020  ? Left epiretinal membrane 10/17/2019  ? Right epiretinal membrane 10/17/2019  ? Early stage nonexudative age-related macular degeneration of both eyes 10/17/2019  ? Drug-induced gynecomastia 08/14/2015  ? Mediastinal mass 08/09/2014  ? History of prostate cancer 08/09/2014  ? Hypertension 08/09/2014  ? ? ?Stark Bray, PT ?07/26/2021, 11:00 AM ? ?[place] ?[facility] ?[place], [place], [address] ?Phone: [phone]   Fax:  [phone] ? ?Name: Nicholas Daniels ?MRN: [id number] ?Date of Birth: 1951/02/27 ? ? ? ?

## 2021-07-30 ENCOUNTER — Ambulatory Visit: Payer: PPO | Admitting: Physical Therapy

## 2021-07-30 ENCOUNTER — Encounter: Payer: Self-pay | Admitting: Physical Therapy

## 2021-07-30 DIAGNOSIS — Z483 Aftercare following surgery for neoplasm: Secondary | ICD-10-CM

## 2021-07-30 DIAGNOSIS — C770 Secondary and unspecified malignant neoplasm of lymph nodes of head, face and neck: Secondary | ICD-10-CM

## 2021-07-30 DIAGNOSIS — R293 Abnormal posture: Secondary | ICD-10-CM

## 2021-07-30 DIAGNOSIS — I890 Lymphedema, not elsewhere classified: Secondary | ICD-10-CM

## 2021-07-30 NOTE — Therapy (Signed)
[place] ?[facility] ?[place], [place], [address] ?Phone: [phone]   Fax:  [phone] ? ?Physical Therapy Treatment ? ?Patient Details  ?Name: Nicholas Daniels ?MRN: [id number] ?Date of Birth: Feb 04, 1951 ?Referring Provider (PT): Isidore Moos ? ? ?Encounter Date: 07/30/2021 ? ? PT End of Session - 07/30/21 1405   ? ? Visit Number 11   ? Number of Visits 17   ? Date for PT Re-Evaluation 08/20/21   ? PT Start Time 1400   ? PT Stop Time 3710   ? PT Time Calculation (min) 48 min   ? Activity Tolerance Patient tolerated treatment well   ? Behavior During Therapy [facility] for tasks assessed/performed   ? ?  ?  ? ?  ? ? ?Past Medical History:  ?Diagnosis Date  ? Atrial fibrillation ([place])   ? Cataract   ? removed bilateraly  ? Coronary artery calcification seen on CT scan   ? Cough   ? Essential hypertension   ? Glaucoma   ? Head and neck cancer ([place])   ? Hypertension   ? PFO (patent foramen ovale)   ? PFO noted on coronary CT 09/05/14  ? Prostate cancer [facility]) 2009  ? ? ?Past Surgical History:  ?Procedure Laterality Date  ? CATARACT EXTRACTION    ? bilateral and a wrinkle on retina on the left   ? CHOLECYSTECTOMY    ? COLONOSCOPY    ? DIRECT LARYNGOSCOPY N/A 12/17/2020  ? Procedure: DIRECT LARYNGOSCOPY WITH BIOPSIES;  Surgeon: Izora Gala, MD;  Location: [place];  Service: ENT;  Laterality: N/A;  ? ESOPHAGOSCOPY N/A 12/17/2020  ? Procedure: ESOPHAGOSCOPY;  Surgeon: Izora Gala, MD;  Location: [place];  Service: ENT;  Laterality: N/A;  ? IR GASTROSTOMY TUBE MOD SED  01/28/2021  ? IR IMAGING GUIDED PORT INSERTION  01/28/2021  ? PROSTATE SURGERY  2009  ? radiation treatment    ? RADICAL NECK DISSECTION Left 12/17/2020  ? Procedure: LEFT NECK DISSECTION;  Surgeon: Izora Gala, MD;  Location: [place];  Service: ENT;  Laterality: Left;  ? RESECTION OF MEDIASTINAL MASS N/A 02/26/2015  ? Procedure: RESECTION OF MEDIASTINAL MASS;  Surgeon: Ivin Poot, MD;  Location: [place];  Service:  Thoracic;  Laterality: N/A;  ? STERNOTOMY N/A 02/26/2015  ? Procedure: STERNOTOMY;  Surgeon: Ivin Poot, MD;  Location: [place];  Service: Thoracic;  Laterality: N/A;  ? UPPER GASTROINTESTINAL ENDOSCOPY    ? ? ?There were no vitals filed for this visit. ? ? Subjective Assessment - 07/30/21 1452   ? ? Subjective I have not ordered the garment. I don't plan on it.   ? 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 The PET demonstrated enlarged level 2 lymph nodes with minimal uptake concerning for neoplasm, 10/05/20 left cervical lymph node biopsy revealed poorly differentiated carcinoma, PSA staining was negative, 12/17/20 left neck level 1-3 lymph node biopsies revealing metastatic SCC in 2 of 15 lymph nodes with extracapsular extension present. CT scan on 12/31/20 demonstrated no chest metastasis, will receive 30 fractions of radiation to his Oropharynx and bilateral neck with weekly cisplatin.  He started on 01/29/21 and will complete on 03/13/21, PAC/PEG 01/28/21   ? Patient Stated Goals to gain info from providers   ? Currently in Pain? No/denies   ?  Pain Score 0-No pain   ? ?  ?  ? ?  ? ? ? ? ? ? ? ? ? ? ? ? ? ? ? ? ? ? ? ? [place] Adult PT Treatment/Exercise - 07/30/21 0001   ? ?  ? Manual Therapy  ? Soft tissue mobilization to L side of neck above and below scar   ? Manual Lymphatic Drainage (MLD) short neck, 5 diaphragmatic breaths, bilateral axillary nodes, bilateral pectoral nodes, bilateral chest, short neck, posterior, lateral and anterior neck and retraced all steps   ? ?  ?  ? ?  ? ? ? ? ? ? ? ? ? ? ? ? ? ? ? PT Long Term Goals - 07/23/21 1101   ? ?  ? PT LONG TERM GOAL #1  ? Title Pt will be independent in self MLD for long term management of lymphedema.   ? Baseline 07/23/21- progressing towards indep but still requring v/c   ? Time 4   ?  Period Weeks   ? Status On-going   ? Target Date 08/20/21   ?  ? PT LONG TERM GOAL #2  ? Title Pt will obtain appropriate compression garment for long term management of lymphedema.   ? Baseline 07/23/21- pt issued info to obtain solaris head and neck garment   ? Time 4   ? Period Weeks   ? Status On-going   ? ?  ?  ? ?  ? ? ? ? ? ? ? ? Plan - 07/30/21 1450   ? ? Clinical Impression Statement Pt reports he has not obtained his compression garment yet. He did not plan on purchasing the garment and was just going to use the pump. Educated pt about difference between a compression pump and a compression garment and how they are different but how each component is needed for long term management of lymphedema. Pt was going to consider purchasing it. Continued with MLD to anterior neck. Fullness still noted in anterior neck.   ? PT Frequency 2x / week   ? PT Duration 4 weeks   ? PT Treatment/Interventions ADLs/Self Care Home Management;Therapeutic exercise;Patient/family education;Manual techniques;Manual lymph drainage;Scar mobilization;Passive range of motion;Taping;Vasopneumatic Device   ? PT Next Visit Plan did pt order solaris head and neck garment?, continue MLD to head and neck and instruct using Norton anterior approach, instruct in scar mobilization, hear from [employer]?   ? PT Home Exercise Plan self MLD   ? Consulted and Agree with Plan of Care Patient   ? ?  ?  ? ?  ? ? ?Patient will benefit from skilled therapeutic intervention in order to improve the following deficits and impairments:  Postural dysfunction, Increased fascial restricitons, Decreased range of motion, Decreased scar mobility, Increased edema ? ?Visit Diagnosis: ?Lymphedema, not elsewhere classified ? ?Aftercare following surgery for neoplasm ? ?Abnormal posture ? 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 ? ? ? ?Problem List ?Patient Active Problem List  ? Diagnosis Date Noted  ? History of radiation to head and neck region 05/10/2021  ?  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  ? Trismus 05/10/2021  ? Dysgeusia 05/10/2021  ? Atrial fibrillation with RVR ([place]) 03/05/2021  ? Port-A-Cath in place 02/05/2021  ? Head and neck cancer ([place]) 01/01/2021  ? SPK (superficial punctate keratitis), left 12/13/2020  ? Dry eyes, bilateral 12/13/2020  ? 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 Increasing PSA level after treatment for prostate cancer 11/22/2020  ?  Macular retinoschisis, left 11/08/2020  ? Left epiretinal membrane 10/17/2019  ? Right epiretinal membrane 10/17/2019  ? Early stage nonexudative age-related macular degeneration of both eyes 10/17/2019  ? Drug-induced gynecomastia 08/14/2015  ? Mediastinal mass 08/09/2014  ? History of prostate cancer 08/09/2014  ? Hypertension 08/09/2014  ? ? ?Allyson Sabal [place], PT ?07/30/2021, 2:52 PM ? ?[place] ?[facility] ?[place], [place], [address] ?Phone: [phone]   Fax:  [phone] ? ?Name: Nicholas Daniels ?MRN: [id number] ?Date of Birth: Aug 27, 1950 ? ? ? ?

## 2021-08-02 ENCOUNTER — Encounter: Payer: Self-pay | Admitting: Rehabilitation

## 2021-08-02 ENCOUNTER — Ambulatory Visit: Payer: PPO | Admitting: Rehabilitation

## 2021-08-02 DIAGNOSIS — Z483 Aftercare following surgery for neoplasm: Secondary | ICD-10-CM

## 2021-08-02 DIAGNOSIS — I890 Lymphedema, not elsewhere classified: Secondary | ICD-10-CM | POA: Diagnosis not present

## 2021-08-02 DIAGNOSIS — C770 Secondary and unspecified malignant neoplasm of lymph nodes of head, face and neck: Secondary | ICD-10-CM

## 2021-08-02 DIAGNOSIS — R293 Abnormal posture: Secondary | ICD-10-CM

## 2021-08-02 NOTE — Therapy (Signed)
[place] [facility] ?[place], [place], [address] ?Phone: [phone]   Fax:  [phone] ? ?Physical Therapy Treatment ? ?Patient Details  ?Name: Nicholas Daniels ?MRN: [id number] ?Date of Birth: 06-27-1950 ?Referring Provider (PT): Isidore Moos ? ? ?Encounter Date: 08/02/2021 ? ? PT End of Session - 08/02/21 1153   ? ? Visit Number 12   ? Number of Visits 17   ? Date for PT Re-Evaluation 08/20/21   ? PT Start Time 1105   ? PT Stop Time 6270   ? PT Time Calculation (min) 44 min   ? Activity Tolerance Patient tolerated treatment well   ? Behavior During Therapy [facility] for tasks assessed/performed   ? ?  ?  ? ?  ? ? ?Past Medical History:  ?Diagnosis Date  ? Atrial fibrillation ([place])   ? Cataract   ? removed bilateraly  ? Coronary artery calcification seen on CT scan   ? Cough   ? Essential hypertension   ? Glaucoma   ? Head and neck cancer ([place])   ? Hypertension   ? PFO (patent foramen ovale)   ? PFO noted on coronary CT 09/05/14  ? Prostate cancer [facility]) 2009  ? ? ?Past Surgical History:  ?Procedure Laterality Date  ? CATARACT EXTRACTION    ? bilateral and a wrinkle on retina on the left   ? CHOLECYSTECTOMY    ? COLONOSCOPY    ? DIRECT LARYNGOSCOPY N/A 12/17/2020  ? Procedure: DIRECT LARYNGOSCOPY WITH BIOPSIES;  Surgeon: Izora Gala, MD;  Location: [place];  Service: ENT;  Laterality: N/A;  ? ESOPHAGOSCOPY N/A 12/17/2020  ? Procedure: ESOPHAGOSCOPY;  Surgeon: Izora Gala, MD;  Location: [place];  Service: ENT;  Laterality: N/A;  ? IR GASTROSTOMY TUBE MOD SED  01/28/2021  ? IR IMAGING GUIDED PORT INSERTION  01/28/2021  ? PROSTATE SURGERY  2009  ? radiation treatment    ? RADICAL NECK DISSECTION Left 12/17/2020  ? Procedure: LEFT NECK DISSECTION;  Surgeon: Izora Gala, MD;  Location: [place];  Service: ENT;  Laterality: Left;  ? RESECTION OF MEDIASTINAL MASS N/A 02/26/2015  ? Procedure: RESECTION OF MEDIASTINAL MASS;  Surgeon: Ivin Poot, MD;  Location: [place];  Service:  Thoracic;  Laterality: N/A;  ? STERNOTOMY N/A 02/26/2015  ? Procedure: STERNOTOMY;  Surgeon: Ivin Poot, MD;  Location: [place];  Service: Thoracic;  Laterality: N/A;  ? UPPER GASTROINTESTINAL ENDOSCOPY    ? ? ?There were no vitals filed for this visit. ? ? Subjective Assessment - 08/02/21 1103   ? ? Subjective I will call [employer] today (gave him Derek's number as Jinny Blossom is still out on FMLA)   ? 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 The PET demonstrated enlarged level 2 lymph nodes with minimal uptake concerning for neoplasm, 10/05/20 left cervical lymph node biopsy revealed poorly differentiated carcinoma, PSA staining was negative, 12/17/20 left neck level 1-3 lymph node biopsies revealing metastatic SCC in 2 of 15 lymph nodes with extracapsular extension present. CT scan on 12/31/20 demonstrated no chest metastasis, will receive 30 fractions of radiation to his Oropharynx and bilateral neck with weekly cisplatin.  He started on 01/29/21 and will complete on 03/13/21, PAC/PEG 01/28/21   ? Currently in Pain? No/denies   ? ?  ?  ? ?  ? ? ? ? ? ? ? ? ? ? ? ? ? ? ? ? ? ? ? ?  [place] Adult PT Treatment/Exercise - 08/02/21 0001   ? ?  ? Manual Therapy  ? Manual therapy comments messaged [facility] regarding assistance for compression as pt would like to get it but is unable to afford.   ? Soft tissue mobilization to L side of neck above and below scar   ? Manual Lymphatic Drainage (MLD) short neck, 5 diaphragmatic breaths, bilateral axillary nodes, bilateral pectoral nodes, bilateral chest, short neck, posterior, lateral and anterior neck and retraced all steps   ? ?  ?  ? ?  ? ? ? ? ? ? ? ? ? ? ? ? ? ? ? PT Long Term Goals - 07/23/21 1101   ? ?  ? PT LONG TERM GOAL #1  ? Title Pt will be independent in self MLD for long term management of  lymphedema.   ? Baseline 07/23/21- progressing towards indep but still requring v/c   ? Time 4   ? Period Weeks   ? Status On-going   ? Target Date 08/20/21   ?  ? PT LONG TERM GOAL #2  ? Title Pt will obtain appropriate compression garment for long term management of lymphedema.   ? Baseline 07/23/21- pt issued info to obtain solaris head and neck garment   ? Time 4   ? Period Weeks   ? Status On-going   ? ?  ?  ? ?  ? ? ? ? ? ? ? ? Plan - 08/02/21 1154   ? ? Clinical Impression Statement Pt has not yet heard back from [employer] so he was given Derek's information as Jinny Blossom is still out.  Also let pt know that Vicente Males mentioned he is running behind with contacting patients.  Also looking into garment assistance.  Continued MLD and scar tissue mobilization.   ? PT Frequency 2x / week   ? PT Duration 4 weeks   ? PT Treatment/Interventions ADLs/Self Care Home Management;Therapeutic exercise;Patient/family education;Manual techniques;Manual lymph drainage;Scar mobilization;Passive range of motion;Taping;Vasopneumatic Device   ? PT Next Visit Plan hear from [facility] regarding garment assist? continue MLD to head and neck and instruct using Norton anterior approach, instruct in scar mobilization, hear from [place]?   ? Consulted and Agree with Plan of Care Patient   ? ?  ?  ? ?  ? ? ?Patient will benefit from skilled therapeutic intervention in order to improve the following deficits and impairments:  Postural dysfunction, Increased fascial restricitons, Decreased range of motion, Decreased scar mobility, Increased edema ? ?Visit Diagnosis: ?Lymphedema, not elsewhere classified ? ?Aftercare following surgery for neoplasm ? ?Abnormal posture ? 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 ? ? ? ?Problem List ?Patient Active Problem List  ? Diagnosis Date Noted  ? History of radiation to head and neck region 05/10/2021  ? 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  ? Trismus 05/10/2021  ? Dysgeusia 05/10/2021  ? Atrial fibrillation  with RVR ([place]) 03/05/2021  ? Port-A-Cath in place 02/05/2021  ? Head and neck cancer ([place]) 01/01/2021  ? SPK (superficial punctate keratitis), left 12/13/2020  ? Dry eyes, bilateral 12/13/2020  ? 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 Increasing PSA level after treatment for prostate cancer 11/22/2020  ? Macular retinoschisis, left 11/08/2020  ? Left epiretinal membrane 10/17/2019  ? Right epiretinal membrane 10/17/2019  ? Early stage nonexudative age-related macular degeneration of both eyes 10/17/2019  ? Drug-induced gynecomastia 08/14/2015  ? Mediastinal mass 08/09/2014  ? History of prostate cancer 08/09/2014  ?  Hypertension 08/09/2014  ? ? ?Stark Bray, PT ?08/02/2021, 11:55 AM ? ? ?[facility] ?[place], [place], [address] ?Phone: [phone]   Fax:  [phone] ? ?Name: Nicholas Daniels ?MRN: [id number] ?Date of Birth: [birth year] ? ? ? ?

## 2021-08-05 ENCOUNTER — Ambulatory Visit: Payer: PPO | Attending: Radiation Oncology | Admitting: Physical Therapy

## 2021-08-05 ENCOUNTER — Encounter: Payer: Self-pay | Admitting: Physical Therapy

## 2021-08-05 DIAGNOSIS — I890 Lymphedema, not elsewhere classified: Secondary | ICD-10-CM | POA: Insufficient documentation

## 2021-08-05 DIAGNOSIS — R293 Abnormal posture: Secondary | ICD-10-CM | POA: Diagnosis not present

## 2021-08-05 DIAGNOSIS — C770 Secondary and unspecified malignant neoplasm of lymph nodes of head, face and neck: Secondary | ICD-10-CM | POA: Insufficient documentation

## 2021-08-05 DIAGNOSIS — Z483 Aftercare following surgery for neoplasm: Secondary | ICD-10-CM | POA: Diagnosis not present

## 2021-08-05 DIAGNOSIS — R1310 Dysphagia, unspecified: Secondary | ICD-10-CM | POA: Insufficient documentation

## 2021-08-05 NOTE — Therapy (Signed)
[place] ?[facility] ?[place], [place], [address] ?Phone: [phone]   Fax:  [phone] ? ?Physical Therapy Treatment ? ?Patient Details  ?Name: Nicholas Daniels ?MRN: [id number] ?Date of Birth: 19-Jul-1950 ?Referring Provider (PT): Isidore Moos ? ? ?Encounter Date: 08/05/2021 ? ? PT End of Session - 08/05/21 1302   ? ? Visit Number 13   ? Number of Visits 17   ? Date for PT Re-Evaluation 08/20/21   ? PT Start Time 1302   ? PT Stop Time 1346   ? PT Time Calculation (min) 44 min   ? Activity Tolerance Patient tolerated treatment well   ? Behavior During Therapy [facility] for tasks assessed/performed   ? ?  ?  ? ?  ? ? ?Past Medical History:  ?Diagnosis Date  ? Atrial fibrillation ([place])   ? Cataract   ? removed bilateraly  ? Coronary artery calcification seen on CT scan   ? Cough   ? Essential hypertension   ? Glaucoma   ? Head and neck cancer ([place])   ? Hypertension   ? PFO (patent foramen ovale)   ? PFO noted on coronary CT 09/05/14  ? Prostate cancer [facility]) 2009  ? ? ?Past Surgical History:  ?Procedure Laterality Date  ? CATARACT EXTRACTION    ? bilateral and a wrinkle on retina on the left   ? CHOLECYSTECTOMY    ? COLONOSCOPY    ? DIRECT LARYNGOSCOPY N/A 12/17/2020  ? Procedure: DIRECT LARYNGOSCOPY WITH BIOPSIES;  Surgeon: Izora Gala, MD;  Location: [place];  Service: ENT;  Laterality: N/A;  ? ESOPHAGOSCOPY N/A 12/17/2020  ? Procedure: ESOPHAGOSCOPY;  Surgeon: Izora Gala, MD;  Location: [place];  Service: ENT;  Laterality: N/A;  ? IR GASTROSTOMY TUBE MOD SED  01/28/2021  ? IR IMAGING GUIDED PORT INSERTION  01/28/2021  ? PROSTATE SURGERY  2009  ? radiation treatment    ? RADICAL NECK DISSECTION Left 12/17/2020  ? Procedure: LEFT NECK DISSECTION;  Surgeon: Izora Gala, MD;  Location: [place];  Service: ENT;  Laterality: Left;  ? RESECTION OF MEDIASTINAL MASS N/A 02/26/2015  ? Procedure: RESECTION OF MEDIASTINAL MASS;  Surgeon: Ivin Poot, MD;  Location: [place];  Service:  Thoracic;  Laterality: N/A;  ? STERNOTOMY N/A 02/26/2015  ? Procedure: STERNOTOMY;  Surgeon: Ivin Poot, MD;  Location: [place];  Service: Thoracic;  Laterality: N/A;  ? UPPER GASTROINTESTINAL ENDOSCOPY    ? ? ?There were no vitals filed for this visit. ? ? Subjective Assessment - 08/05/21 1349   ? ? Subjective I need all the help I can get (in regards to assist for garments).   ? 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 The PET demonstrated enlarged level 2 lymph nodes with minimal uptake concerning for neoplasm, 10/05/20 left cervical lymph node biopsy revealed poorly differentiated carcinoma, PSA staining was negative, 12/17/20 left neck level 1-3 lymph node biopsies revealing metastatic SCC in 2 of 15 lymph nodes with extracapsular extension present. CT scan on 12/31/20 demonstrated no chest metastasis, will receive 30 fractions of radiation to his Oropharynx and bilateral neck with weekly cisplatin.  He started on 01/29/21 and will complete on 03/13/21, PAC/PEG 01/28/21   ? Patient Stated Goals to gain info from providers   ? Currently in  Pain? No/denies   ? Pain Score 0-No pain   ? ?  ?  ? ?  ? ? ? ? ? ? ? ? ? ? ? ? ? ? ? ? ? ? ? ? [place] Adult PT Treatment/Exercise - 08/05/21 0001   ? ?  ? Manual Therapy  ? Soft tissue mobilization to L side of neck above and below scar   ? Manual Lymphatic Drainage (MLD) short neck, 5 diaphragmatic breaths, bilateral axillary nodes, bilateral pectoral nodes, bilateral chest, short neck, posterior, lateral and anterior neck and retraced all steps   ? ?  ?  ? ?  ? ? ? ? ? ? ? ? ? ? ? ? ? ? ? PT Long Term Goals - 07/23/21 1101   ? ?  ? PT LONG TERM GOAL #1  ? Title Pt will be independent in self MLD for long term management of lymphedema.   ? Baseline 07/23/21- progressing towards indep but still requring v/c    ? Time 4   ? Period Weeks   ? Status On-going   ? Target Date 08/20/21   ?  ? PT LONG TERM GOAL #2  ? Title Pt will obtain appropriate compression garment for long term management of lymphedema.   ? Baseline 07/23/21- pt issued info to obtain solaris head and neck garment   ? Time 4   ? Period Weeks   ? Status On-going   ? ?  ?  ? ?  ? ? ? ? ? ? ? ? Plan - 08/05/21 1350   ? ? Clinical Impression Statement Continued with MLD to anterior neck. [employer] may be able to help patient with purchasing garments. Still waiting on final word to know for sure. Pt did not mention hearing back from [place] will ask again at next session. He is still demonstrating fullness in anterior neck though it appears slighly smaller today.   ? PT Treatment/Interventions ADLs/Self Care Home Management;Therapeutic exercise;Patient/family education;Manual techniques;Manual lymph drainage;Scar mobilization;Passive range of motion;Taping;Vasopneumatic Device   ? PT Next Visit Plan hear from [facility] regarding garment assist? continue MLD to head and neck and instruct using Norton anterior approach, instruct in scar mobilization, hear from [employer]?   ? PT Home Exercise Plan self MLD   ? Consulted and Agree with Plan of Care Patient   ? ?  ?  ? ?  ? ? ?Patient will benefit from skilled therapeutic intervention in order to improve the following deficits and impairments:  Postural dysfunction, Increased fascial restricitons, Decreased range of motion, Decreased scar mobility, Increased edema ? ?Visit Diagnosis: ?Lymphedema, not elsewhere classified ? ?Aftercare following surgery for neoplasm ? ?Abnormal posture ? 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 ? ? ? ?Problem List ?Patient Active Problem List  ? Diagnosis Date Noted  ? History of radiation to head and neck region 05/10/2021  ? 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  ? Trismus 05/10/2021  ? Dysgeusia 05/10/2021  ? Atrial fibrillation with RVR ([place]) 03/05/2021  ? Port-A-Cath in place  02/05/2021  ? Head and neck cancer ([place]) 01/01/2021  ? SPK (superficial punctate keratitis), left 12/13/2020  ? Dry eyes, bilateral 12/13/2020  ? 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 Increasing PSA level after treatment for prostate cancer 11/22/2020  ? Macular retinoschisis, left 11/08/2020  ? Left epiretinal membrane 10/17/2019  ? Right epiretinal membrane 10/17/2019  ? Early stage nonexudative age-related macular degeneration of both eyes 10/17/2019  ? Drug-induced  gynecomastia 08/14/2015  ? Mediastinal mass 08/09/2014  ? History of prostate cancer 08/09/2014  ? Hypertension 08/09/2014  ? ? ?Allyson Sabal Meta, PT ?08/05/2021, 1:51 PM ? ?[place] ?[facility] ?[place], [place], [address] ?Phone: [phone]   Fax:  [phone] ? ?Name: Nicholas Daniels ?MRN: [id number] ?Date of Birth: 1950/07/30 ? ? ? ?

## 2021-08-07 ENCOUNTER — Encounter: Payer: Self-pay | Admitting: Physical Therapy

## 2021-08-07 ENCOUNTER — Ambulatory Visit: Payer: PPO | Admitting: Physical Therapy

## 2021-08-07 DIAGNOSIS — Z483 Aftercare following surgery for neoplasm: Secondary | ICD-10-CM

## 2021-08-07 DIAGNOSIS — R293 Abnormal posture: Secondary | ICD-10-CM

## 2021-08-07 DIAGNOSIS — I890 Lymphedema, not elsewhere classified: Secondary | ICD-10-CM | POA: Diagnosis not present

## 2021-08-07 DIAGNOSIS — C770 Secondary and unspecified malignant neoplasm of lymph nodes of head, face and neck: Secondary | ICD-10-CM

## 2021-08-07 NOTE — Therapy (Signed)
[place] ?[facility] ?[place], [place], [address] ?Phone: [phone]   Fax:  [phone] ? ?Physical Therapy Treatment ? ?Patient Details  ?Name: Nicholas Daniels ?MRN: [id number] ?Date of Birth: [birth year] ?Referring Provider (PT): Isidore Moos ? ? ?Encounter Date: 08/07/2021 ? ? PT End of Session - 08/07/21 1306   ? ? Visit Number 14   ? Number of Visits 17   ? Date for PT Re-Evaluation 08/20/21   ? PT Start Time 1304   ? PT Stop Time 1962   ? PT Time Calculation (min) 53 min   ? Activity Tolerance Patient tolerated treatment well   ? Behavior During Therapy [facility] for tasks assessed/performed   ? ?  ?  ? ?  ? ? ?Past Medical History:  ?Diagnosis Date  ? Atrial fibrillation ([place])   ? Cataract   ? removed bilateraly  ? Coronary artery calcification seen on CT scan   ? Cough   ? Essential hypertension   ? Glaucoma   ? Head and neck cancer ([place])   ? Hypertension   ? PFO (patent foramen ovale)   ? PFO noted on coronary CT 09/05/14  ? Prostate cancer [facility]) 2009  ? ? ?Past Surgical History:  ?Procedure Laterality Date  ? CATARACT EXTRACTION    ? bilateral and a wrinkle on retina on the left   ? CHOLECYSTECTOMY    ? COLONOSCOPY    ? DIRECT LARYNGOSCOPY N/A 12/17/2020  ? Procedure: DIRECT LARYNGOSCOPY WITH BIOPSIES;  Surgeon: Izora Gala, MD;  Location: [place];  Service: ENT;  Laterality: N/A;  ? ESOPHAGOSCOPY N/A 12/17/2020  ? Procedure: ESOPHAGOSCOPY;  Surgeon: Izora Gala, MD;  Location: [place];  Service: ENT;  Laterality: N/A;  ? IR GASTROSTOMY TUBE MOD SED  01/28/2021  ? IR IMAGING GUIDED PORT INSERTION  01/28/2021  ? PROSTATE SURGERY  2009  ? radiation treatment    ? RADICAL NECK DISSECTION Left 12/17/2020  ? Procedure: LEFT NECK DISSECTION;  Surgeon: Izora Gala, MD;  Location: [place];  Service: ENT;  Laterality: Left;  ? RESECTION OF MEDIASTINAL MASS N/A 02/26/2015  ? Procedure: RESECTION OF MEDIASTINAL MASS;  Surgeon: Ivin Poot, MD;  Location: [place];  Service:  Thoracic;  Laterality: N/A;  ? STERNOTOMY N/A 02/26/2015  ? Procedure: STERNOTOMY;  Surgeon: Ivin Poot, MD;  Location: [place];  Service: Thoracic;  Laterality: N/A;  ? UPPER GASTROINTESTINAL ENDOSCOPY    ? ? ?There were no vitals filed for this visit. ? ? Subjective Assessment - 08/07/21 1401   ? ? Subjective It is great news that they approved my garment.   ? 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 The PET demonstrated enlarged level 2 lymph nodes with minimal uptake concerning for neoplasm, 10/05/20 left cervical lymph node biopsy revealed poorly differentiated carcinoma, PSA staining was negative, 12/17/20 left neck level 1-3 lymph node biopsies revealing metastatic SCC in 2 of 15 lymph nodes with extracapsular extension present. CT scan on 12/31/20 demonstrated no chest metastasis, will receive 30 fractions of radiation to his Oropharynx and bilateral neck with weekly cisplatin.  He started on 01/29/21 and will complete on 03/13/21, PAC/PEG 01/28/21   ? Patient Stated Goals to gain info from providers   ? Currently in Pain? No/denies   ?  Pain Score 0-No pain   ? ?  ?  ? ?  ? ? ? ? ? ? ? ? ? ? ? ? ? ? ? ? ? ? ? ? [place] Adult PT Treatment/Exercise - 08/07/21 0001   ? ?  ? Manual Therapy  ? Manual therapy comments assisted pt with obtaining solaris head and neck garment through [employer]   ? Manual Lymphatic Drainage (MLD) short neck, 5 diaphragmatic breaths, bilateral axillary nodes, bilateral pectoral nodes, bilateral chest, short neck, posterior, lateral and anterior neck and retraced all steps   ? ?  ?  ? ?  ? ? ? ? ? ? ? ? ? ? ? ? ? ? ? PT Long Term Goals - 07/23/21 1101   ? ?  ? PT LONG TERM GOAL #1  ? Title Pt will be independent in self MLD for long term management of lymphedema.   ? Baseline 07/23/21- progressing towards indep but still  requring v/c   ? Time 4   ? Period Weeks   ? Status On-going   ? Target Date 08/20/21   ?  ? PT LONG TERM GOAL #2  ? Title Pt will obtain appropriate compression garment for long term management of lymphedema.   ? Baseline 07/23/21- pt issued info to obtain solaris head and neck garment   ? Time 4   ? Period Weeks   ? Status On-going   ? ?  ?  ? ?  ? ? ? ? ? ? ? ? Plan - 08/07/21 1401   ? ? Clinical Impression Statement Pt returns to PT and was approved by the [employer] fund to obtain a solaris head and neck garment for management of his anterior neck lymphedema. Ordered this for pt today and had it shipped to pt directly. Continued with MLD to anterior neck. Pt is going to reach out to [place] to check on the status of his compression pump.   ? PT Frequency 2x / week   ? PT Duration 4 weeks   ? PT Treatment/Interventions ADLs/Self Care Home Management;Therapeutic exercise;Patient/family education;Manual techniques;Manual lymph drainage;Scar mobilization;Passive range of motion;Taping;Vasopneumatic Device   ? PT Next Visit Plan did pt get garment? continue MLD to head and neck and instruct using Norton anterior approach, instruct in scar mobilization, hear from [facility]?   ? PT Home Exercise Plan self MLD   ? Consulted and Agree with Plan of Care Patient   ? ?  ?  ? ?  ? ? ?Patient will benefit from skilled therapeutic intervention in order to improve the following deficits and impairments:  Postural dysfunction, Increased fascial restricitons, Decreased range of motion, Decreased scar mobility, Increased edema ? ?Visit Diagnosis: ?Lymphedema, not elsewhere classified ? ?Aftercare following surgery for neoplasm ? ?Abnormal posture ? 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 ? ? ? ?Problem List ?Patient Active Problem List  ? Diagnosis Date Noted  ? History of radiation to head and neck region 05/10/2021  ? 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  ? Trismus 05/10/2021  ? Dysgeusia 05/10/2021  ? Atrial fibrillation  with RVR ([place]) 03/05/2021  ? Port-A-Cath in place 02/05/2021  ? Head and neck cancer ([place]) 01/01/2021  ? SPK (superficial punctate keratitis), left 12/13/2020  ? Dry eyes, bilateral 12/13/2020  ? 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 Increasing PSA level after treatment for prostate cancer 11/22/2020  ? Macular retinoschisis, left 11/08/2020  ? Left epiretinal membrane 10/17/2019  ? Right  epiretinal membrane 10/17/2019  ? Early stage nonexudative age-related macular degeneration of both eyes 10/17/2019  ? Drug-induced gynecomastia 08/14/2015  ? Mediastinal mass 08/09/2014  ? History of prostate cancer 08/09/2014  ? Hypertension 08/09/2014  ? ? ?Tensed, PT ?08/07/2021, 2:03 PM ? ?[place] ?[facility] ?[place], [place], [address] ?Phone: [phone]   Fax:  [phone] ? ?Name: Nicholas Daniels ?MRN: [id number] ?Date of Birth: 1950-06-13 ? ? ? ?

## 2021-08-09 ENCOUNTER — Encounter: Payer: Self-pay | Admitting: Oncology

## 2021-08-12 DIAGNOSIS — C61 Malignant neoplasm of prostate: Secondary | ICD-10-CM | POA: Diagnosis not present

## 2021-08-13 ENCOUNTER — Encounter: Payer: Self-pay | Admitting: Physical Therapy

## 2021-08-13 ENCOUNTER — Ambulatory Visit: Payer: PPO | Admitting: Physical Therapy

## 2021-08-13 DIAGNOSIS — Z483 Aftercare following surgery for neoplasm: Secondary | ICD-10-CM

## 2021-08-13 DIAGNOSIS — I890 Lymphedema, not elsewhere classified: Secondary | ICD-10-CM | POA: Diagnosis not present

## 2021-08-13 DIAGNOSIS — C770 Secondary and unspecified malignant neoplasm of lymph nodes of head, face and neck: Secondary | ICD-10-CM

## 2021-08-13 DIAGNOSIS — R293 Abnormal posture: Secondary | ICD-10-CM

## 2021-08-13 NOTE — Therapy (Signed)
[place] ?[facility] ?[place], [place], [address] ?Phone: [phone]   Fax:  [phone] ? ?Physical Therapy Treatment ? ?Patient Details  ?Name: Nicholas Daniels ?MRN: [id number] ?Date of Birth: [birth year] ?Referring Provider (PT): Isidore Moos ? ? ?Encounter Date: 08/13/2021 ? ? PT End of Session - 08/13/21 1412   ? ? Visit Number 15   ? Number of Visits 17   ? Date for PT Re-Evaluation 08/20/21   ? PT Start Time 8416   ? PT Stop Time 6063   ? PT Time Calculation (min) 43 min   ? Activity Tolerance Patient tolerated treatment well   ? Behavior During Therapy [facility] for tasks assessed/performed   ? ?  ?  ? ?  ? ? ?Past Medical History:  ?Diagnosis Date  ? Atrial fibrillation ([place])   ? Cataract   ? removed bilateraly  ? Coronary artery calcification seen on CT scan   ? Cough   ? Essential hypertension   ? Glaucoma   ? Head and neck cancer ([place])   ? Hypertension   ? PFO (patent foramen ovale)   ? PFO noted on coronary CT 09/05/14  ? Prostate cancer [facility]) 2009  ? ? ?Past Surgical History:  ?Procedure Laterality Date  ? CATARACT EXTRACTION    ? bilateral and a wrinkle on retina on the left   ? CHOLECYSTECTOMY    ? COLONOSCOPY    ? DIRECT LARYNGOSCOPY N/A 12/17/2020  ? Procedure: DIRECT LARYNGOSCOPY WITH BIOPSIES;  Surgeon: Izora Gala, MD;  Location: [place];  Service: ENT;  Laterality: N/A;  ? ESOPHAGOSCOPY N/A 12/17/2020  ? Procedure: ESOPHAGOSCOPY;  Surgeon: Izora Gala, MD;  Location: [place];  Service: ENT;  Laterality: N/A;  ? IR GASTROSTOMY TUBE MOD SED  01/28/2021  ? IR IMAGING GUIDED PORT INSERTION  01/28/2021  ? PROSTATE SURGERY  2009  ? radiation treatment    ? RADICAL NECK DISSECTION Left 12/17/2020  ? Procedure: LEFT NECK DISSECTION;  Surgeon: Izora Gala, MD;  Location: [place];  Service: ENT;  Laterality: Left;  ? RESECTION OF MEDIASTINAL MASS N/A 02/26/2015  ? Procedure: RESECTION OF MEDIASTINAL MASS;  Surgeon: Ivin Poot, MD;  Location: [place];  Service:  Thoracic;  Laterality: N/A;  ? STERNOTOMY N/A 02/26/2015  ? Procedure: STERNOTOMY;  Surgeon: Ivin Poot, MD;  Location: [place];  Service: Thoracic;  Laterality: N/A;  ? UPPER GASTROINTESTINAL ENDOSCOPY    ? ? ?There were no vitals filed for this visit. ? ? Subjective Assessment - 08/13/21 1450   ? ? Subjective I got my compression garment and I need help getting it on. I also received my compression pump and have been using it since Thursday.   ? 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 The PET demonstrated enlarged level 2 lymph nodes with minimal uptake concerning for neoplasm, 10/05/20 left cervical lymph node biopsy revealed poorly differentiated carcinoma, PSA staining was negative, 12/17/20 left neck level 1-3 lymph node biopsies revealing metastatic SCC in 2 of 15 lymph nodes with extracapsular extension present. CT scan on 12/31/20 demonstrated no chest metastasis, will receive 30 fractions of radiation to his Oropharynx and bilateral neck with weekly cisplatin.  He started on 01/29/21 and will complete on 03/13/21, PAC/PEG 01/28/21   ? Patient Stated  Goals to gain info from providers   ? Currently in Pain? No/denies   ? Pain Score 0-No pain   ? ?  ?  ? ?  ? ? ? ? ? ? ? ? LYMPHEDEMA/ONCOLOGY QUESTIONNAIRE - 08/13/21 0001   ? ?  ? Head and Neck  ? 4 cm superior to sternal notch around neck 42.1 cm   ? 6 cm superior to sternal notch around neck 42.7 cm   ? 8 cm superior to sternal notch around neck 43.5 cm   ? ?  ?  ? ?  ? ? ? ? ? ? ? ? ? ? ? ? ? [place] Adult PT Treatment/Exercise - 08/13/21 0001   ? ?  ? Manual Therapy  ? Manual therapy comments educated pt on how to don/doff solaris head and neck garment and had pt return demonstrate it   ? Manual Lymphatic Drainage (MLD) short neck, 5 diaphragmatic breaths, bilateral axillary nodes,  bilateral pectoral nodes, bilateral chest, short neck, posterior, lateral and anterior neck and retraced all steps   ? ?  ?  ? ?  ? ? ? ? ? ? ? ? ? ? ? ? ? ? ? PT Long Term Goals - 07/23/21 1101   ? ?  ? PT LONG TERM GOAL #1  ? Title Pt will be independent in self MLD for long term management of lymphedema.   ? Baseline 07/23/21- progressing towards indep but still requring v/c   ? Time 4   ? Period Weeks   ? Status On-going   ? Target Date 08/20/21   ?  ? PT LONG TERM GOAL #2  ? Title Pt will obtain appropriate compression garment for long term management of lymphedema.   ? Baseline 07/23/21- pt issued info to obtain solaris head and neck garment   ? Time 4   ? Period Weeks   ? Status On-going   ? ?  ?  ? ?  ? ? ? ? ? ? ? ? Plan - 08/13/21 1451   ? ? Clinical Impression Statement Pt received his compression garment. Instructed pt today in correct donning/doffing. Pt also received his Flexi head and neck compression pump which he has been using since Thursday. Remeasured circumferences and they have decreased since evaluation. Will continue to monitor anterior neck swelling and assess if pt can manage  it independently.   ? PT Frequency 2x / week   ? PT Duration 4 weeks   ? PT Treatment/Interventions ADLs/Self Care Home Management;Therapeutic exercise;Patient/family education;Manual techniques;Manual lymph drainage;Scar mobilization;Passive range of motion;Taping;Vasopneumatic Device   ? PT Next Visit Plan how was garmetn? continue MLD to head and neck and instruct using Norton anterior approach, instruct in scar mobilization, how is pump?   ? PT Home Exercise Plan self MLD   ? Consulted and Agree with Plan of Care Patient   ? ?  ?  ? ?  ? ? ?Patient will benefit from skilled therapeutic intervention in order to improve the following deficits and impairments:  Postural dysfunction, Increased fascial restricitons, Decreased range of motion, Decreased scar mobility, Increased edema ? ?Visit Diagnosis: ?Lymphedema, not  elsewhere classified ? ?Aftercare following surgery for neoplasm ? ?Abnormal posture ? 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 ? ? ? ?Problem List ?Patient Active Problem List  ? Diagnosis Date Noted  ? History of radiation to head and neck region 05/10/2021  ? 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  ? Trismus 05/10/2021  ? Dysgeusia  05/10/2021  ? Atrial fibrillation with RVR ([place]) 03/05/2021  ? Port-A-Cath in place 02/05/2021  ? Head and neck cancer ([place]) 01/01/2021  ? SPK (superficial punctate keratitis), left 12/13/2020  ? Dry eyes, bilateral 12/13/2020  ? 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 Increasing PSA level after treatment for prostate cancer 11/22/2020  ? Macular retinoschisis, left 11/08/2020  ? Left epiretinal membrane 10/17/2019  ? Right epiretinal membrane 10/17/2019  ? Early stage nonexudative age-related macular degeneration of both eyes 10/17/2019  ? Drug-induced gynecomastia 08/14/2015  ? Mediastinal mass 08/09/2014  ? History of prostate cancer 08/09/2014  ? Hypertension 08/09/2014  ? ? ?Allyson Sabal [place], PT ?08/13/2021, 2:53 PM ? ?[place] ?[facility] ?[place], [place], [address] ?Phone: [phone]   Fax:  [phone] ? ?Name: Nicholas Daniels ?MRN: [id number] ?Date of Birth: May 25, 1950 ? ? ? ?

## 2021-08-15 ENCOUNTER — Ambulatory Visit: Payer: PPO | Admitting: Physical Therapy

## 2021-08-15 ENCOUNTER — Encounter: Payer: Self-pay | Admitting: Physical Therapy

## 2021-08-15 DIAGNOSIS — Z483 Aftercare following surgery for neoplasm: Secondary | ICD-10-CM

## 2021-08-15 DIAGNOSIS — I890 Lymphedema, not elsewhere classified: Secondary | ICD-10-CM

## 2021-08-15 DIAGNOSIS — C770 Secondary and unspecified malignant neoplasm of lymph nodes of head, face and neck: Secondary | ICD-10-CM

## 2021-08-15 DIAGNOSIS — R293 Abnormal posture: Secondary | ICD-10-CM

## 2021-08-15 NOTE — Therapy (Signed)
[place] ?[facility] ?[place], [place], [address] ?Phone: [phone]   Fax:  [phone] ? ?Physical Therapy Treatment ? ?Patient Details  ?Name: Nicholas Daniels ?MRN: [id number] ?Date of Birth: [birth year] ?Referring Provider (PT): Isidore Moos ? ? ?Encounter Date: 08/15/2021 ? ? PT End of Session - 08/15/21 1449   ? ? Visit Number 16   ? Number of Visits 17   ? Date for PT Re-Evaluation 08/20/21   ? PT Start Time 8341   ? PT Stop Time 9622   ? PT Time Calculation (min) 43 min   ? Activity Tolerance Patient tolerated treatment well   ? Behavior During Therapy [facility] for tasks assessed/performed   ? ?  ?  ? ?  ? ? ?Past Medical History:  ?Diagnosis Date  ? Atrial fibrillation ([place])   ? Cataract   ? removed bilateraly  ? Coronary artery calcification seen on CT scan   ? Cough   ? Essential hypertension   ? Glaucoma   ? Head and neck cancer ([place])   ? Hypertension   ? PFO (patent foramen ovale)   ? PFO noted on coronary CT 09/05/14  ? Prostate cancer [facility]) 2009  ? ? ?Past Surgical History:  ?Procedure Laterality Date  ? CATARACT EXTRACTION    ? bilateral and a wrinkle on retina on the left   ? CHOLECYSTECTOMY    ? COLONOSCOPY    ? DIRECT LARYNGOSCOPY N/A 12/17/2020  ? Procedure: DIRECT LARYNGOSCOPY WITH BIOPSIES;  Surgeon: Izora Gala, MD;  Location: [place];  Service: ENT;  Laterality: N/A;  ? ESOPHAGOSCOPY N/A 12/17/2020  ? Procedure: ESOPHAGOSCOPY;  Surgeon: Izora Gala, MD;  Location: [place];  Service: ENT;  Laterality: N/A;  ? IR GASTROSTOMY TUBE MOD SED  01/28/2021  ? IR IMAGING GUIDED PORT INSERTION  01/28/2021  ? PROSTATE SURGERY  2009  ? radiation treatment    ? RADICAL NECK DISSECTION Left 12/17/2020  ? Procedure: LEFT NECK DISSECTION;  Surgeon: Izora Gala, MD;  Location: [place];  Service: ENT;  Laterality: Left;  ? RESECTION OF MEDIASTINAL MASS N/A 02/26/2015  ? Procedure: RESECTION OF MEDIASTINAL MASS;  Surgeon: Ivin Poot, MD;  Location: [place];  Service:  Thoracic;  Laterality: N/A;  ? STERNOTOMY N/A 02/26/2015  ? Procedure: STERNOTOMY;  Surgeon: Ivin Poot, MD;  Location: [place];  Service: Thoracic;  Laterality: N/A;  ? UPPER GASTROINTESTINAL ENDOSCOPY    ? ? ?There were no vitals filed for this visit. ? ? Subjective Assessment - 08/15/21 1404   ? ? Subjective The trainer came yesterday and made some adjustments to my pump and I used it before I came here.   ? 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 The PET demonstrated enlarged level 2 lymph nodes with minimal uptake concerning for neoplasm, 10/05/20 left cervical lymph node biopsy revealed poorly differentiated carcinoma, PSA staining was negative, 12/17/20 left neck level 1-3 lymph node biopsies revealing metastatic SCC in 2 of 15 lymph nodes with extracapsular extension present. CT scan on 12/31/20 demonstrated no chest metastasis, will receive 30 fractions of radiation to his Oropharynx and bilateral neck with weekly cisplatin.  He started on 01/29/21 and will complete on 03/13/21, PAC/PEG 01/28/21   ? Patient Stated Goals to gain info from providers   ?  Currently in Pain? No/denies   ? Pain Score 0-No pain   ? ?  ?  ? ?  ? ? ? ? ? ? ? ? LYMPHEDEMA/ONCOLOGY QUESTIONNAIRE - 08/15/21 0001   ? ?  ? Head and Neck  ? 4 cm superior to sternal notch around neck 42 cm   ? 6 cm superior to sternal notch around neck 42.7 cm   ? 8 cm superior to sternal notch around neck 43.8 cm   ? ?  ?  ? ?  ? ? ? ? ? ? ? ? ? ? ? ? ? [place] Adult PT Treatment/Exercise - 08/15/21 0001   ? ?  ? Manual Therapy  ? Manual therapy comments remeasured circumferences   ? Manual Lymphatic Drainage (MLD) short neck, 5 diaphragmatic breaths, bilateral axillary nodes, bilateral pectoral nodes, bilateral chest, short neck, posterior, lateral and anterior neck and retraced all  steps   ? ?  ?  ? ?  ? ? ? ? ? ? ? ? ? ? ? ? ? ? ? PT Long Term Goals - 07/23/21 1101   ? ?  ? PT LONG TERM GOAL #1  ? Title Pt will be independent in self MLD for long term management of lymphedema.   ? Baseline 07/23/21- progressing towards indep but still requring v/c   ? Time 4   ? Period Weeks   ? Status On-going   ? Target Date 08/20/21   ?  ? PT LONG TERM GOAL #2  ? Title Pt will obtain appropriate compression garment for long term management of lymphedema.   ? Baseline 07/23/21- pt issued info to obtain solaris head and neck garment   ? Time 4   ? Period Weeks   ? Status On-going   ? ?  ?  ? ?  ? ? ? ? ? ? ? ? Plan - 08/15/21 1453   ? ? Clinical Impression Statement Pt has continued to use his FlexiTouch compression pump. The trainer came yesterday and made some additional adjustments to help it work better. He has been wearing his compression garment for around 6 hours a day. Continued with MLD to anterior neck and he still has increased swelling in this area. Once pt is able to independently manage his edema he will be ready for discharge.   ? PT Frequency 2x / week   ? PT Duration 4 weeks   ? PT Treatment/Interventions ADLs/Self Care Home Management;Therapeutic exercise;Patient/family education;Manual techniques;Manual lymph drainage;Scar mobilization;Passive range of motion;Taping;Vasopneumatic Device   ? PT Next Visit Plan continue MLD to head and neck and instruct using Norton anterior approach, instruct in scar mobilization, how is pump?   ? PT Home Exercise Plan self MLD   ? Consulted and Agree with Plan of Care Patient   ? ?  ?  ? ?  ? ? ?Patient will benefit from skilled therapeutic intervention in order to improve the following deficits and impairments:  Postural dysfunction, Increased fascial restricitons, Decreased range of motion, Decreased scar mobility, Increased edema ? ?Visit Diagnosis: ?Lymphedema, not elsewhere classified ? ?Aftercare following surgery for neoplasm ? ?Abnormal  posture ? 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 ? ? ? ?Problem List ?Patient Active Problem List  ? Diagnosis Date Noted  ? History of radiation to head and neck region 05/10/2021  ? 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  ? Trismus 05/10/2021  ? Dysgeusia 05/10/2021  ? Atrial fibrillation with RVR ([place]) 03/05/2021  ? Port-A-Cath in  place 02/05/2021  ? Head and neck cancer ([place]) 01/01/2021  ? SPK (superficial punctate keratitis), left 12/13/2020  ? Dry eyes, bilateral 12/13/2020  ? 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 Increasing PSA level after treatment for prostate cancer 11/22/2020  ? Macular retinoschisis, left 11/08/2020  ? Left epiretinal membrane 10/17/2019  ? Right epiretinal membrane 10/17/2019  ? Early stage nonexudative age-related macular degeneration of both eyes 10/17/2019  ? Drug-induced gynecomastia 08/14/2015  ? Mediastinal mass 08/09/2014  ? History of prostate cancer 08/09/2014  ? Hypertension 08/09/2014  ? ? ?Allyson Sabal [place], PT ?08/15/2021, 2:54 PM ? ?[place] ?[facility] [place] ?[place], [place], [address] ?Phone: [phone]   Fax:  [phone] ? ?Name: Nicholas Daniels ?MRN: [id number] ?Date of Birth: 1950-11-03 ? ? ? ?

## 2021-08-19 DIAGNOSIS — N3942 Incontinence without sensory awareness: Secondary | ICD-10-CM | POA: Diagnosis not present

## 2021-08-19 DIAGNOSIS — N3020 Other chronic cystitis without hematuria: Secondary | ICD-10-CM | POA: Diagnosis not present

## 2021-08-19 DIAGNOSIS — C61 Malignant neoplasm of prostate: Secondary | ICD-10-CM | POA: Diagnosis not present

## 2021-08-20 ENCOUNTER — Encounter: Payer: Self-pay | Admitting: Physical Therapy

## 2021-08-20 ENCOUNTER — Other Ambulatory Visit: Payer: Self-pay

## 2021-08-20 ENCOUNTER — Ambulatory Visit: Payer: PPO | Admitting: Physical Therapy

## 2021-08-20 DIAGNOSIS — Z483 Aftercare following surgery for neoplasm: Secondary | ICD-10-CM

## 2021-08-20 DIAGNOSIS — I890 Lymphedema, not elsewhere classified: Secondary | ICD-10-CM

## 2021-08-20 DIAGNOSIS — C770 Secondary and unspecified malignant neoplasm of lymph nodes of head, face and neck: Secondary | ICD-10-CM

## 2021-08-20 DIAGNOSIS — R293 Abnormal posture: Secondary | ICD-10-CM

## 2021-08-20 NOTE — Therapy (Signed)
[place] ?[facility] ?[place], [place], [address] ?Phone: [phone]   Fax:  [phone] ? ?Physical Therapy Treatment ? ?Patient Details  ?Name: Nicholas Daniels ?MRN: [id number] ?Date of Birth: 04-28-1950 ?Referring Provider (PT): Isidore Moos ? ? ?Encounter Date: 08/20/2021 ? ? PT End of Session - 08/20/21 1406   ? ? Visit Number 17   ? Number of Visits 25   ? Date for PT Re-Evaluation 09/17/21   ? PT Start Time 9024   ? PT Stop Time 0973   ? PT Time Calculation (min) 42 min   ? Activity Tolerance Patient tolerated treatment well   ? Behavior During Therapy [facility] for tasks assessed/performed   ? ?  ?  ? ?  ? ? ?Past Medical History:  ?Diagnosis Date  ? Atrial fibrillation ([place])   ? Cataract   ? removed bilateraly  ? Coronary artery calcification seen on CT scan   ? Cough   ? Essential hypertension   ? Glaucoma   ? Head and neck cancer ([place])   ? Hypertension   ? PFO (patent foramen ovale)   ? PFO noted on coronary CT 09/05/14  ? Prostate cancer [facility]) 2009  ? ? ?Past Surgical History:  ?Procedure Laterality Date  ? CATARACT EXTRACTION    ? bilateral and a wrinkle on retina on the left   ? CHOLECYSTECTOMY    ? COLONOSCOPY    ? DIRECT LARYNGOSCOPY N/A 12/17/2020  ? Procedure: DIRECT LARYNGOSCOPY WITH BIOPSIES;  Surgeon: Izora Gala, MD;  Location: [place];  Service: ENT;  Laterality: N/A;  ? ESOPHAGOSCOPY N/A 12/17/2020  ? Procedure: ESOPHAGOSCOPY;  Surgeon: Izora Gala, MD;  Location: [place];  Service: ENT;  Laterality: N/A;  ? IR GASTROSTOMY TUBE MOD SED  01/28/2021  ? IR IMAGING GUIDED PORT INSERTION  01/28/2021  ? PROSTATE SURGERY  2009  ? radiation treatment    ? RADICAL NECK DISSECTION Left 12/17/2020  ? Procedure: LEFT NECK DISSECTION;  Surgeon: Izora Gala, MD;  Location: [place];  Service: ENT;  Laterality: Left;  ? RESECTION OF MEDIASTINAL MASS N/A 02/26/2015  ? Procedure: RESECTION OF MEDIASTINAL MASS;  Surgeon: Ivin Poot, MD;  Location: [place];  Service:  Thoracic;  Laterality: N/A;  ? STERNOTOMY N/A 02/26/2015  ? Procedure: STERNOTOMY;  Surgeon: Ivin Poot, MD;  Location: [place];  Service: Thoracic;  Laterality: N/A;  ? UPPER GASTROINTESTINAL ENDOSCOPY    ? ? ?There were no vitals filed for this visit. ? ? Subjective Assessment - 08/20/21 1447   ? ? Subjective I have been using my pump and wearing my garment.   ? 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 The PET demonstrated enlarged level 2 lymph nodes with minimal uptake concerning for neoplasm, 10/05/20 left cervical lymph node biopsy revealed poorly differentiated carcinoma, PSA staining was negative, 12/17/20 left neck level 1-3 lymph node biopsies revealing metastatic SCC in 2 of 15 lymph nodes with extracapsular extension present. CT scan on 12/31/20 demonstrated no chest metastasis, will receive 30 fractions of radiation to his Oropharynx and bilateral neck with weekly cisplatin.  He started on 01/29/21 and will complete on 03/13/21, PAC/PEG 01/28/21   ? Patient Stated Goals to gain info from providers   ? Currently in Pain? No/denies   ?  Pain Score 0-No pain   ? ?  ?  ? ?  ? ? ? ? ? ? ? ? LYMPHEDEMA/ONCOLOGY QUESTIONNAIRE - 08/20/21 0001   ? ?  ? Head and Neck  ? 4 cm superior to sternal notch around neck 42 cm   ? 6 cm superior to sternal notch around neck 42.5 cm   ? 8 cm superior to sternal notch around neck 43.5 cm   ? ?  ?  ? ?  ? ? ? ? ? ? ? ? ? ? ? ? ? [place] Adult PT Treatment/Exercise - 08/20/21 0001   ? ?  ? Manual Therapy  ? Manual therapy comments remeasured circumferences   ? Manual Lymphatic Drainage (MLD) short neck, 5 diaphragmatic breaths, bilateral axillary nodes, bilateral pectoral nodes, bilateral chest, short neck, posterior, lateral and anterior neck and retraced all steps   ? ?  ?  ? ?  ? ? ? ? ? ? ? ? ? ? ? ? ? ? ?  PT Long Term Goals - 08/20/21 1408   ? ?  ? PT LONG TERM GOAL #1  ? Title Pt will be independent in self MLD for long term management of lymphedema.   ? Baseline 07/23/21- progressing towards indep but still requring v/c   ? Time 4   ? Period Weeks   ? Status On-going   ?  ? PT LONG TERM GOAL #2  ? Title Pt will obtain appropriate compression garment for long term management of lymphedema.   ? Baseline 07/23/21- pt issued info to obtain solaris head and neck garment   ? Status On-going   ? ?  ?  ? ?  ? ? ? ? ? ? ? ? Plan - 08/20/21 1444   ? ? Clinical Impression Statement Remeasured circumferences today and pt demonstrates a decrease throughout his neck. He has been using his compression pump and wear his compression garment. Continued with MLD today. Educated pt to bring his garment to next session to ensure he is independent in donning/doffing and then pt will be discharged from skilled PT services.   ? PT Frequency 2x / week   ? PT Duration 4 weeks   ? PT Treatment/Interventions ADLs/Self Care Home Management;Therapeutic exercise;Patient/family education;Manual techniques;Manual lymph drainage;Scar mobilization;Passive range of motion;Taping;Vasopneumatic Device   ? PT Next Visit Plan continue MLD to head and neck and instruct using Norton anterior approach, make sure pt is indep in don/doff garment/ d/c on Thurs   ? PT Home Exercise Plan self MLD   ? Consulted and Agree with Plan of Care Patient   ? ?  ?  ? ?  ? ? ?Patient will benefit from skilled therapeutic intervention in order to improve the following deficits and impairments:  Postural dysfunction, Increased fascial restricitons, Decreased range of motion, Decreased scar mobility, Increased edema ? ?Visit Diagnosis: ?Lymphedema, not elsewhere classified ? ?Aftercare following surgery for neoplasm ? ?Abnormal posture ? 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 ? ? ? ?Problem List ?Patient Active Problem List  ? Diagnosis Date Noted  ? History of radiation to  head and neck region 05/10/2021  ? 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  ? Trismus 05/10/2021  ? Dysgeusia 05/10/2021  ? Atrial fibrillation with RVR ([place]) 03/05/2021  ? Port-A-Cath in place 02/05/2021  ? Head and neck cancer ([place]) 01/01/2021  ? SPK (superficial punctate keratitis), left 12/13/2020  ? Dry eyes, bilateral 12/13/2020  ? 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 Increasing PSA level after treatment for prostate cancer 11/22/2020  ? Macular retinoschisis, left 11/08/2020  ? Left epiretinal membrane 10/17/2019  ? Right epiretinal membrane 10/17/2019  ? Early stage nonexudative age-related macular degeneration of both eyes 10/17/2019  ? Drug-induced gynecomastia 08/14/2015  ? Mediastinal mass 08/09/2014  ? History of prostate cancer 08/09/2014  ? Hypertension 08/09/2014  ? ? ?[place], PT ?08/20/2021, 2:48 PM ? ?[place] ?[facility] ?[place], [place], [address] ?Phone: [phone]   Fax:  [phone] ? ?Name: Nicholas Daniels ?MRN: [id number] ?Date of Birth: Jun 01, 1950 ? ? ? ?

## 2021-08-22 ENCOUNTER — Encounter: Payer: Self-pay | Admitting: Rehabilitation

## 2021-08-22 ENCOUNTER — Ambulatory Visit: Payer: PPO | Admitting: Rehabilitation

## 2021-08-22 DIAGNOSIS — R293 Abnormal posture: Secondary | ICD-10-CM

## 2021-08-22 DIAGNOSIS — I890 Lymphedema, not elsewhere classified: Secondary | ICD-10-CM | POA: Diagnosis not present

## 2021-08-22 DIAGNOSIS — C770 Secondary and unspecified malignant neoplasm of lymph nodes of head, face and neck: Secondary | ICD-10-CM

## 2021-08-22 DIAGNOSIS — Z483 Aftercare following surgery for neoplasm: Secondary | ICD-10-CM

## 2021-08-22 DIAGNOSIS — R1310 Dysphagia, unspecified: Secondary | ICD-10-CM

## 2021-08-22 NOTE — Therapy (Signed)
[facility] @ [address] [place], [place], [address] Phone: [phone]   Fax:  [phone]  Physical Therapy Treatment  Patient Details  Name: Nicholas Daniels MRN: [id number] Date of Birth: 09/04/1950 Referring Provider (PT): Isidore Moos   Encounter Date: 08/22/2021   PT End of Session - 08/22/21 1330     Visit Number 18    Number of Visits 25    Date for PT Re-Evaluation 09/17/21    PT Start Time 5364    PT Stop Time 6803    PT Time Calculation (min) 43 min    Activity Tolerance Patient tolerated treatment well    Behavior During Therapy [facility] for tasks assessed/performed             Past Medical History:  Diagnosis Date   Atrial fibrillation ([place])    Cataract    removed bilateraly   Coronary artery calcification seen on CT scan    Cough    Essential hypertension    Glaucoma    Head and neck cancer ([place])    Hypertension    PFO (patent foramen ovale)    PFO noted on coronary CT 09/05/14   Prostate cancer ([place]) 2009    Past Surgical History:  Procedure Laterality Date   CATARACT EXTRACTION     bilateral and a wrinkle on retina on the left    CHOLECYSTECTOMY     COLONOSCOPY     DIRECT LARYNGOSCOPY N/A 12/17/2020   Procedure: DIRECT LARYNGOSCOPY WITH BIOPSIES;  Surgeon: Izora Gala, MD;  Location: [facility];  Service: ENT;  Laterality: N/A;   ESOPHAGOSCOPY N/A 12/17/2020   Procedure: ESOPHAGOSCOPY;  Surgeon: Izora Gala, MD;  Location: [place] [facility];  Service: ENT;  Laterality: N/A;   IR GASTROSTOMY TUBE MOD SED  01/28/2021   IR IMAGING GUIDED PORT INSERTION  01/28/2021   PROSTATE SURGERY  2009   radiation treatment     RADICAL NECK DISSECTION Left 12/17/2020   Procedure: LEFT NECK DISSECTION;  Surgeon: Izora Gala, MD;  Location: [facility];  Service: ENT;  Laterality: Left;   RESECTION OF MEDIASTINAL MASS N/A 02/26/2015   Procedure: RESECTION OF MEDIASTINAL MASS;  Surgeon: Ivin Poot, MD;  Location: [facility];  Service:  Thoracic;  Laterality: N/A;   STERNOTOMY N/A 02/26/2015   Procedure: STERNOTOMY;  Surgeon: Ivin Poot, MD;  Location: [facility] OR;  Service: Thoracic;  Laterality: N/A;   UPPER GASTROINTESTINAL ENDOSCOPY      There were no vitals filed for this visit.   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 The PET demonstrated enlarged level 2 lymph nodes with minimal uptake concerning for neoplasm, 10/05/20 left cervical lymph node biopsy revealed poorly differentiated carcinoma, PSA staining was negative, 12/17/20 left neck level 1-3 lymph node biopsies revealing metastatic SCC in 2 of 15 lymph nodes with extracapsular extension present. CT scan on 12/31/20 demonstrated no chest metastasis, will receive 30 fractions of radiation to his Oropharynx and bilateral neck with weekly cisplatin.  He started on 01/29/21 and will complete on 03/13/21, PAC/PEG 01/28/21    Currently in Pain? No/denies  [facility] Adult PT Treatment/Exercise - 08/22/21 0001       Manual Therapy   Manual Lymphatic Drainage (MLD) short neck, 5 diaphragmatic breaths, bilateral axillary nodes, bilateral pectoral nodes, bilateral chest, short neck, posterior, lateral and anterior neck and retraced all steps                          PT Long Term Goals - 08/20/21 1408       PT LONG TERM GOAL #1   Title Pt will be independent in self MLD for long term management of lymphedema.    Baseline 07/23/21- progressing towards indep but still requring v/c    Time 4    Period Weeks    Status MET     PT LONG TERM GOAL #2   Title Pt will obtain appropriate compression garment for long term management of lymphedema.    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[place])  Dysphagia, unspecified type     Problem List Patient Active Problem List   Diagnosis Date Noted   History of radiation to head and neck region 05/10/2021   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   Trismus 05/10/2021   Dysgeusia 05/10/2021   Atrial fibrillation with RVR ([place]) 03/05/2021   Port-A-Cath in place 02/05/2021   Head and neck cancer ([place]) 01/01/2021   SPK (superficial punctate keratitis), left 12/13/2020   Dry eyes, bilateral 12/13/2020   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 ([place]) 11/22/2020   Increasing PSA level after treatment for prostate cancer 11/22/2020   Macular retinoschisis, left 11/08/2020   Left epiretinal membrane 10/17/2019   Right epiretinal membrane 10/17/2019   Early stage nonexudative age-related macular degeneration of both eyes 10/17/2019   Drug-induced gynecomastia 08/14/2015   Mediastinal mass 08/09/2014   History of prostate cancer 08/09/2014   Hypertension 08/09/2014    Stark Bray, PT 08/22/2021, 2:17 PM  [facility] @ [address] [place], [place], [address] Phone: [phone]   Fax:  [phone]  Name: Nicholas Daniels MRN: [id number] Date of Birth: [birth year]  PHYSICAL THERAPY DISCHARGE SUMMARY  Visits from Start of Care: 18  Current functional level related to goals / functional outcomes: See above   Remaining deficits: Chronic lymphedema   Education /  Equipment: Has pump, compression, ind with self MLD  Plan: Patient agrees to discharge.   Patient is being discharged due to meeting the stated rehab goals.

## 2021-08-27 ENCOUNTER — Encounter: Payer: Self-pay | Admitting: Rehabilitation

## 2021-08-27 ENCOUNTER — Other Ambulatory Visit: Payer: Self-pay | Admitting: Radiology

## 2021-08-29 ENCOUNTER — Ambulatory Visit (HOSPITAL_COMMUNITY)
Admission: RE | Admit: 2021-08-29 | Discharge: 2021-08-29 | Disposition: A | Discharge status: Home / Self Care | Payer: PPO | Source: Ambulatory Visit | Attending: Oncology | Admitting: Oncology

## 2021-08-29 ENCOUNTER — Encounter: Payer: Self-pay | Admitting: Rehabilitation

## 2021-08-29 ENCOUNTER — Encounter (HOSPITAL_COMMUNITY): Payer: Self-pay

## 2021-08-29 ENCOUNTER — Other Ambulatory Visit: Payer: Self-pay

## 2021-08-29 DIAGNOSIS — Z8546 Personal history of malignant neoplasm of prostate: Secondary | ICD-10-CM | POA: Diagnosis not present

## 2021-08-29 DIAGNOSIS — Z923 Personal history of irradiation: Secondary | ICD-10-CM | POA: Diagnosis not present

## 2021-08-29 DIAGNOSIS — C770 Secondary and unspecified malignant neoplasm of lymph nodes of head, face and neck: Secondary | ICD-10-CM | POA: Diagnosis not present

## 2021-08-29 DIAGNOSIS — Z431 Encounter for attention to gastrostomy: Secondary | ICD-10-CM | POA: Diagnosis not present

## 2021-08-29 DIAGNOSIS — Z9221 Personal history of antineoplastic chemotherapy: Secondary | ICD-10-CM | POA: Diagnosis not present

## 2021-08-29 DIAGNOSIS — Z452 Encounter for adjustment and management of vascular access device: Secondary | ICD-10-CM | POA: Insufficient documentation

## 2021-08-29 HISTORY — PX: IR GASTROSTOMY TUBE REMOVAL: IMG5492

## 2021-08-29 HISTORY — PX: IR REMOVAL TUN ACCESS W/ PORT W/O FL MOD SED: IMG2290

## 2021-08-29 MED ORDER — SODIUM CHLORIDE 0.9 % IV SOLN: INTRAVENOUS | Status: DC

## 2021-08-29 MED ORDER — LIDOCAINE-EPINEPHRINE 1 %-1:100000 IJ SOLN
INTRAMUSCULAR | Status: AC
  Filled 2021-08-29: qty 1

## 2021-08-29 MED ORDER — HEPARIN SOD (PORK) LOCK FLUSH 100 UNIT/ML IV SOLN: 500.0000 [IU] | INTRAVENOUS | Status: DC | PRN

## 2021-08-29 MED ORDER — FENTANYL CITRATE (PF) 100 MCG/2ML IJ SOLN
INTRAMUSCULAR | Status: AC | PRN
  Administered 2021-08-29: 50 ug via INTRAVENOUS

## 2021-08-29 MED ORDER — MIDAZOLAM HCL 2 MG/2ML IJ SOLN
INTRAMUSCULAR | Status: AC | PRN
  Administered 2021-08-29: 1 mg via INTRAVENOUS

## 2021-08-29 MED ORDER — LIDOCAINE HCL 1 % IJ SOLN
INTRAMUSCULAR | Status: AC | PRN
Start: 2021-08-29 — End: 2021-08-29
  Administered 2021-08-29: 10 mL via INTRADERMAL

## 2021-08-29 MED ORDER — FENTANYL CITRATE (PF) 100 MCG/2ML IJ SOLN
INTRAMUSCULAR | Status: AC
  Filled 2021-08-29: qty 2

## 2021-08-29 MED ORDER — MIDAZOLAM HCL 2 MG/2ML IJ SOLN
INTRAMUSCULAR | Status: AC
  Filled 2021-08-29: qty 4

## 2021-08-29 MED ORDER — FENTANYL CITRATE (PF) 100 MCG/2ML IJ SOLN
INTRAMUSCULAR | Status: AC | PRN
Start: 2021-08-29 — End: 2021-08-29
  Administered 2021-08-29: 50 ug via INTRAVENOUS

## 2021-08-29 NOTE — Discharge Instructions (Signed)
Please call Interventional Radiology clinic [phone] with any questions or concerns.  You may remove your dressing and shower tomorrow.   Implanted Port Removal, Care After This sheet gives you information about how to care for yourself after your procedure. Your health care provider may also give you more specific instructions. If you have problems or questions, contact your health care provider. What can I expect after the procedure? After the procedure, it is common to have: Soreness or pain near your incision. Some swelling or bruising near your incision. Follow these instructions at home: Medicines Take over-the-counter and prescription medicines only as told by your health care provider. If you were prescribed an antibiotic medicine, take it as told by your health care provider. Do not stop taking the antibiotic even if you start to feel better. Bathing Do not take baths, swim, or use a hot tub until your health care provider approves. Ask your health care provider if you can take showers. You may only be allowed to take sponge baths. Incision care Follow instructions from your health care provider about how to take care of your incision. Make sure you: Wash your hands with soap and water before you change your bandage (dressing). If soap and water are not available, use hand sanitizer. Change your dressing as told by your health care provider. Keep your dressing dry. Leave stitches (sutures), skin glue, or adhesive strips in place. These skin closures may need to stay in place for 2 weeks or longer. If adhesive strip edges start to loosen and curl up, you may trim the loose edges. Do not remove adhesive strips completely unless your health care provider tells you to do that. Check your incision area every day for signs of infection. Check for: More redness, swelling, or pain. More fluid or blood. Warmth. Pus or a bad smell.    Driving Do not drive for 24 hours if you were  given a medicine to help you relax (sedative) during your procedure. If you did not receive a sedative, ask your health care provider when it is safe to drive.    Activity Return to your normal activities as told by your health care provider. Ask your health care provider what activities are safe for you. Do not lift anything that is heavier than 10 lb (4.5 kg), or the limit that you are told, until your health care provider says that it is safe. Do not do activities that involve lifting your arms over your head. General instructions Do not use any products that contain nicotine or tobacco, such as cigarettes and e-cigarettes. These can delay healing. If you need help quitting, ask your health care provider. Keep all follow-up visits as told by your health care provider. This is important. Contact a health care provider if: You have more redness, swelling, or pain around your incision. You have more fluid or blood coming from your incision. Your incision feels warm to the touch. You have pus or a bad smell coming from your incision. You have pain that is not relieved by your pain medicine. Get help right away if you have: A fever or chills. Chest pain. Difficulty breathing. Summary After the procedure, it is common to have pain, soreness, swelling, or bruising near your incision. If you were prescribed an antibiotic medicine, take it as told by your health care provider. Do not stop taking the antibiotic even if you start to feel better. Do not drive for 24 hours if you were given a sedative during   your procedure. Return to your normal activities as told by your health care provider. Ask your health care provider what activities are safe for you. This information is not intended to replace advice given to you by your health care provider. Make sure you discuss any questions you have with your health care provider. Document Revised: 05/07/2017 Document Reviewed: 05/07/2017 Elsevier Patient  Education  [place].   PEG Tube Removal, Care After This sheet gives you information about how to care for yourself after your procedure. Your health care provider may also give you more specific instructions. If you have problems or questions, contact your health care provider. What can I expect after the procedure? After the procedure, it is common to have: Mild discomfort at the opening in your skin where the tube was removed (tube removal site). A small amount of drainage from the opening. Follow these instructions at home: Care of the tube removal site Follow instructions from your health care provider about how to take care of your tube removal site. Make sure you: Wash your hands with soap and water for at least 20 seconds before and after you change your bandage (dressing). If soap and water are not available, use hand sanitizer. Change your dressing as told by your health care provider. Check your tube removal site every day for signs of infection. Check for: Redness, swelling, or pain. Fluid or blood. Warmth. Pus or a bad smell. Do not take baths, swim, or use a hot tub until your health care provider approves. Ask your health care provider if you may take showers. You may only be allowed to take sponge baths. Activity Return to your normal activities as told by your health care provider. Ask your health care provider what activities are safe for you. Do not lift anything that is heavier than 10 lb (4.5 kg), or the limit that you are told, until your health care provider says that it is safe. General instructions Take over-the-counter and prescription medicines only as told by your health care provider. Follow instructions from your health care provider about eating and drinking. Keep all follow-up visits. This is important. Contact a health care provider if: You have a fever. You have pain in your abdomen. You have redness, swelling, or pain around your tube removal  site. You have fluid or blood coming from your tube removal site. Your tube removal site feels warm to the touch. You have pus or a bad smell coming from your tube removal site. You have nausea or vomiting. Get help right away if: You have persistent bleeding from your tube removal site. You have severe pain in your abdomen. You are not able to eat or drink anything by mouth. Summary Follow instructions from your health care provider about how to take care of your tube removal site. Do not take baths, swim, or use a hot tub until your health care provider approves. Ask your health care provider if you may take showers. You may only be allowed to take sponge baths. Check your tube removal site every day for signs of infection. Return to your normal activities as told by your health care provider. This information is not intended to replace advice given to you by your health care provider. Make sure you discuss any questions you have with your health care provider. Document Revised: 08/05/2019 Document Reviewed: 08/05/2019 Elsevier Patient Education  [place].     Moderate Conscious Sedation, Adult, Care After This sheet gives you information about  how to care for yourself after your procedure. Your health care provider may also give you more specific instructions. If you have problems or questions, contact your health care provider. What can I expect after the procedure? After the procedure, it is common to have: Sleepiness for several hours. Impaired judgment for several hours. Difficulty with balance. Vomiting if you eat too soon. Follow these instructions at home: For the time period you were told by your health care provider: Rest. Do not participate in activities where you could fall or become injured. Do not drive or use machinery. Do not drink alcohol. Do not take sleeping pills or medicines that cause drowsiness. Do not make important decisions or sign legal  documents. Do not take care of children on your own.        Eating and drinking Follow the diet recommended by your health care provider. Drink enough fluid to keep your urine pale yellow. If you vomit: Drink water, juice, or soup when you can drink without vomiting. Make sure you have little or no nausea before eating solid foods.    General instructions Take over-the-counter and prescription medicines only as told by your health care provider. Have a responsible adult stay with you for the time you are told. It is important to have someone help care for you until you are awake and alert. Do not smoke. Keep all follow-up visits as told by your health care provider. This is important. Contact a health care provider if: You are still sleepy or having trouble with balance after 24 hours. You feel light-headed. You keep feeling nauseous or you keep vomiting. You develop a rash. You have a fever. You have redness or swelling around the IV site. Get help right away if: You have trouble breathing. You have new-onset confusion at home. Summary After the procedure, it is common to feel sleepy, have impaired judgment, or feel nauseous if you eat too soon. Rest after you get home. Know the things you should not do after the procedure. Follow the diet recommended by your health care provider and drink enough fluid to keep your urine pale yellow. Get help right away if you have trouble breathing or new-onset confusion at home. This information is not intended to replace advice given to you by your health care provider. Make sure you discuss any questions you have with your health care provider. Document Revised: 07/22/2019 Document Reviewed: 02/17/2019 Elsevier Patient Education  2021 [employer].

## 2021-08-29 NOTE — H&P (Signed)
Referring Physician(s): [place] Physician: Delma Post  Patient Status:  [facility] OP  Chief Complaint:  "I'm getting my port and stomach tube out"  Subjective: Patient known to IR service from left cervical lymph node biopsy on 10/05/2020 as well as Port-A-Cath and G-tube placements on 01/24/2021.  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  He has completed chemoradiation and is eating a regular diet.  He presents today for both Port-A-Cath and gastrostomy tube removals.  He currently denies fever, headache, chest pain, dyspnea, cough, back pain, nausea, vomiting or bleeding.  He does have a little soreness at G-tube insertion site.  Past Medical History:  Diagnosis Date   Atrial fibrillation ([place])    Cataract    removed bilateraly   Coronary artery calcification seen on CT scan    Cough    Essential hypertension    Glaucoma    Head and neck cancer ([facility])    Hypertension    PFO (patent foramen ovale)    PFO noted on coronary CT 09/05/14   Prostate cancer ([place]) 2009   Past Surgical History:  Procedure Laterality Date   CATARACT EXTRACTION     bilateral and a wrinkle on retina on the left    CHOLECYSTECTOMY     COLONOSCOPY     DIRECT LARYNGOSCOPY N/A 12/17/2020   Procedure: DIRECT LARYNGOSCOPY WITH BIOPSIES;  Surgeon: Izora Gala, MD;  Location: [facility];  Service: ENT;  Laterality: N/A;   ESOPHAGOSCOPY N/A 12/17/2020   Procedure: ESOPHAGOSCOPY;  Surgeon: Izora Gala, MD;  Location: [facility];  Service: ENT;  Laterality: N/A;   IR GASTROSTOMY TUBE MOD SED  01/28/2021   IR IMAGING GUIDED PORT INSERTION  01/28/2021   PROSTATE SURGERY  2009   radiation treatment     RADICAL NECK DISSECTION Left 12/17/2020   Procedure: LEFT NECK DISSECTION;  Surgeon: Izora Gala, MD;  Location: [facility];  Service: ENT;  Laterality: Left;   RESECTION OF MEDIASTINAL MASS N/A 02/26/2015   Procedure: RESECTION OF MEDIASTINAL MASS;   Surgeon: Ivin Poot, MD;  Location: [facility];  Service: Thoracic;  Laterality: N/A;   STERNOTOMY N/A 02/26/2015   Procedure: STERNOTOMY;  Surgeon: Ivin Poot, MD;  Location: [facility];  Service: Thoracic;  Laterality: N/A;   UPPER GASTROINTESTINAL ENDOSCOPY        Allergies: Patient has no known allergies.  Medications: Prior to Admission medications   Medication Sig Start Date End Date Taking? Authorizing Provider  apixaban (ELIQUIS) 5 MG TABS tablet Place 1 tablet (5 mg total) into feeding tube 2 (two) times daily. 03/12/21  Yes Georgette Shell, MD  diltiazem (CARDIZEM CD) 120 MG 24 hr capsule Take 1 capsule (120 mg total) by mouth in the morning and at bedtime. 07/01/21 06/26/22 Yes Buford Dresser, MD  latanoprost (XALATAN) 0.005 % ophthalmic solution Place 1 drop into both eyes at bedtime.   Yes [provider]  metoprolol tartrate (LOPRESSOR) 25 MG tablet Take 1 tablet (25 mg total) by mouth 3 (three) times daily. 03/12/21  Yes Georgette Shell, MD  Multiple Vitamin (MULTIVITAMIN) tablet Take 1 tablet by mouth at bedtime.   Yes [provider]  Nutritional Supplements (FEEDING SUPPLEMENT, OSMOLITE 1.5 CAL,) LIQD Osmolite 1.5 - Give 7 cartons split over four feedings/day via PEG. Flush tube with 90 ml water before and after each feeding. Drink by mouth or give via tube additional 2 cups fluids/day.  This provides 2485 kcal, 104.3 g protein, 2467 ml total water. Meets 100% estimated needs. 02/12/21  Yes Wyatt Portela, MD  timolol (TIMOPTIC) 0.5 % ophthalmic solution Place 1 drop into both eyes daily.   Yes [provider]  Vitamin D, Cholecalciferol, 50 MCG (2000 UT) CAPS Take 2,000 Units by mouth at bedtime.   Yes [provider]     Vital Signs: BP 124/73   Pulse 72   Temp 98.3 F (36.8 C) (Oral)   Resp 18   Ht '5\' 7"'$  (1.702 m)   Wt 168 lb (76.2 kg)   SpO2 100%   BMI 26.31 kg/m   Physical Exam awake, alert.  Chest clear to  auscultation bilaterally.  Clean, intact right chest wall Port-A-Cath.  Heart with regular rate and rhythm.  Abdomen soft, positive bowel sounds, intact G-tube with some mild tenderness to palpation at insertion site; no lower extremity edema.  Imaging: No results found.  Labs:  CBC: Recent Labs    03/12/21 0313 05/03/21 1518 06/14/21 1409 07/03/21 1325  WBC 1.4* 4.7 3.9* 4.4  HGB 9.4* 12.5* 13.3 13.4  HCT 28.8* 36.3* 39.1 39.3  PLT 255 235 235 219    COAGS: Recent Labs    10/05/20 1130 01/28/21 1023 03/05/21 2104  INR 0.9 1.0  --   APTT  --   --  20*    BMP: Recent Labs    03/09/21 0338 03/10/21 0128 06/14/21 1409 07/03/21 1325  NA 134* 134* 140 140  K 4.1 4.5 4.6 4.5  CL 100 97* 106 106  CO2 '29 30 30 30  '$ GLUCOSE 115* 121* 108* 100*  BUN '16 23 19 19  '$ CALCIUM 8.4* 8.9 9.5 9.5  CREATININE 0.84 0.75 0.94 0.99  GFRNONAA >60 >60 >60 >60    LIVER FUNCTION TESTS: Recent Labs    03/09/21 0338 03/10/21 0128 06/14/21 1409 07/03/21 1325  BILITOT 0.8 0.6 0.8 0.9  AST '15 18 16 17  '$ ALT '22 24 18 21  '$ ALKPHOS 29* 33* 31* 27*  PROT 5.9* 6.5 6.4* 6.5  ALBUMIN 3.2* 3.3* 3.7 3.7    Assessment and Plan: Patient known to IR service from left cervical lymph node biopsy on 10/05/2020 as well as Port-A-Cath and G-tube placements on 01/24/2021.  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  He has completed chemoradiation and is eating a regular diet.  He presents today for both Port-A-Cath and gastrostomy tube removals.  Details/risks of procedures, including but not limited to, internal bleeding, infection, injury to adjacent structures discussed with patient with his understanding and consent.   Electronically Signed: D. Rowe Robert, PA-C 08/29/2021, 1:24 PM   I spent a total of 20 minutes at the the patient's bedside AND on the patient's hospital floor or unit, greater than 50% of which was counseling/coordinating  care for Port-A-Cath and gastrostomy tube removals

## 2021-08-29 NOTE — Procedures (Signed)
  Procedure:  Removal of the Rt IJ chest port  and G-tube Preprocedure diagnosis: Completed chemotherapy and not using the G-tube. Postprocedure diagnosis: same EBL:    minimal Complications:   none immediate  See full dictation in [facility].  Frazier Richards, MD Main # [phone] Mobile [phone]

## 2021-09-03 ENCOUNTER — Inpatient Hospital Stay: Payer: PPO | Admitting: Dietician

## 2021-09-03 ENCOUNTER — Inpatient Hospital Stay: Payer: PPO | Attending: Oncology

## 2021-09-03 NOTE — Progress Notes (Signed)
Patient did not show for nutrition appointment. 

## 2021-09-05 DIAGNOSIS — D485 Neoplasm of uncertain behavior of skin: Secondary | ICD-10-CM | POA: Diagnosis not present

## 2021-09-05 DIAGNOSIS — L821 Other seborrheic keratosis: Secondary | ICD-10-CM | POA: Diagnosis not present

## 2021-09-05 DIAGNOSIS — Z85828 Personal history of other malignant neoplasm of skin: Secondary | ICD-10-CM | POA: Diagnosis not present

## 2021-09-05 DIAGNOSIS — D0439 Carcinoma in situ of skin of other parts of face: Secondary | ICD-10-CM | POA: Diagnosis not present

## 2021-09-16 ENCOUNTER — Ambulatory Visit: Payer: PPO | Attending: Radiation Oncology

## 2021-09-16 DIAGNOSIS — R1310 Dysphagia, unspecified: Secondary | ICD-10-CM | POA: Diagnosis not present

## 2021-09-16 NOTE — Therapy (Signed)
[facility] [address], [place], [place], [address] Phone: [phone]   Fax:  [phone]  Speech Language Pathology Treatment/Discharge summary  Patient Details  Name: Nicholas Daniels MRN: [id number] Date of Birth: 10-11-1950 Referring Provider (SLP): Eppie Gibson, MD   Encounter Date: 09/16/2021   End of Session - 09/16/21 1623     Visit Number 5    Number of Visits 6    Date for SLP Re-Evaluation 10/20/21    SLP Start Time 1405    SLP Stop Time  1439    SLP Time Calculation (min) 34 min    Activity Tolerance Patient tolerated treatment well             Past Medical History:  Diagnosis Date   Atrial fibrillation ([place])    Cataract    removed bilateraly   Coronary artery calcification seen on CT scan    Cough    Essential hypertension    Glaucoma    Head and neck cancer ([place])    Hypertension    PFO (patent foramen ovale)    PFO noted on coronary CT 09/05/14   Prostate cancer ([place]) 2009    Past Surgical History:  Procedure Laterality Date   CATARACT EXTRACTION     bilateral and a wrinkle on retina on the left    CHOLECYSTECTOMY     COLONOSCOPY     DIRECT LARYNGOSCOPY N/A 12/17/2020   Procedure: DIRECT LARYNGOSCOPY WITH BIOPSIES;  Surgeon: Izora Gala, MD;  Location: [facility];  Service: ENT;  Laterality: N/A;   ESOPHAGOSCOPY N/A 12/17/2020   Procedure: ESOPHAGOSCOPY;  Surgeon: Izora Gala, MD;  Location: [facility];  Service: ENT;  Laterality: N/A;   IR GASTROSTOMY TUBE MOD SED  01/28/2021   IR GASTROSTOMY TUBE REMOVAL  08/29/2021   IR IMAGING GUIDED PORT INSERTION  01/28/2021   IR REMOVAL TUN ACCESS W/ PORT W/O FL MOD SED  08/29/2021   PROSTATE SURGERY  2009   radiation treatment     RADICAL NECK DISSECTION Left 12/17/2020   Procedure: LEFT NECK DISSECTION;  Surgeon: Izora Gala, MD;  Location: [facility];  Service: ENT;  Laterality: Left;   RESECTION OF MEDIASTINAL MASS N/A 02/26/2015   Procedure: RESECTION OF  MEDIASTINAL MASS;  Surgeon: Ivin Poot, MD;  Location: [facility];  Service: Thoracic;  Laterality: N/A;   STERNOTOMY N/A 02/26/2015   Procedure: STERNOTOMY;  Surgeon: Ivin Poot, MD;  Location: [facility];  Service: Thoracic;  Laterality: N/A;   UPPER GASTROINTESTINAL ENDOSCOPY      There were no vitals filed for this visit.  SPEECH THERAPY DISCHARGE SUMMARY  Visits from Start of Care: 5  Current functional level related to goals / functional outcomes: See below.   Remaining deficits: Mild oral dysphagia mostly caused by xerostomia.   Education / Equipment: HEP procedure, rationale for recommended frequency/scope of HEP, s/sx aspiration PNA, rationale for reduction in frequency of HEP at appropriate time to x2-3 times/week  Patient agrees to discharge. Patient goals were partially met. Patient is being discharged due to being pleased with the current functional level..      Subjective Assessment - 09/16/21 1409     Subjective "Weight is holding at 166 lb."    Currently in Pain? No/denies                   ADULT SLP TREATMENT - 09/16/21 1409       General Information   Behavior/Cognition  Alert;Cooperative;Pleasant mood      Treatment Provided   Treatment provided Dysphagia      Dysphagia Treatment   Temperature Spikes Noted No    Respiratory Status Room air    Treatment Methods Skilled observation;Therapeutic exercise;Compensation strategy training;Patient/caregiver education    Patient observed directly with PO's Yes    Type of PO's observed Dysphagia 3 (soft);Thin liquids    Liquids provided via Cup    Oral Phase Signs & Symptoms Other (comment)   no overt s/sx today   Pharyngeal Phase Signs & Symptoms Other (comment)   no overt s/sx today   Other treatment/comments In last week pt had tacos,hamburgers and hot dogs as entrees. Taste has improved. HEP hasbeen completed with more frequency than previous session, approx three times a week; This continues  less than the recommended frequency. SLP shared rationale for more consistent completion, and if pt can think of completing at least 25 reps/day of effortful, masako, mendelsohn, and supraglottic/supersupraglottic, and 6 reps of shaker that till provide  him the best opportunity to maintain WNL swalllow function. Pt demonstrated understanding of this. SLP had to provide min A for [employer] today. SLP      Assessment / Recommendations / Plan   Plan Discharge SLP treatment due to (comment)   pt comfort level with exercise completion and his current diet     Dysphagia Recommendations   Diet recommendations Regular;Thin liquid    Liquids provided via Cup    Medication Administration --   as tolerated     Progression Toward Goals   Progression toward goals --   d/c day - see goals             SLP Education - 09/16/21 1622     Education Details as described in pt's note today    Person(s) Educated Patient    Methods Explanation;Demonstration    Comprehension Verbalized understanding;Returned demonstration;Verbal cues required              Short Term Goals 09/16/21        SLP SHORT TERM GOAL #1      Title pt will complete HEP with rare min A    Time      Period --   vists, for all STGs    Status Achieved           SLP SHORT TERM GOAL #2    Title pt will tell SLP why pt is completing HEP with modified independence    Time      Status Achieved           SLP SHORT TERM GOAL #3    Title pt will describe 3 overt s/s aspiration PNA with modified independence    Time      Status Achieved           SLP SHORT TERM GOAL #4    Title pt will tell SLP how a food journal could hasten return to a more normalized diet    Time      Status Deferred - pt beginning to eat all consistencies now                     Long Term Goals 09/16/21                 SLP LONG TERM GOAL #1    Title pt will complete HEP with modified independence over three visits    Time  03-26-21, 05-21-21    Period --   visits, for all LTGs    Status Partially met           SLP LONG TERM GOAL #2    Title pt will describe how to modify HEP over time, and the timeline associated with reduction in HEP frequency with modified independence over two sessions    Time 09-16-21 with assistance    Status Partially Met       Plan - 09/16/21       Clinical Impression Statement At this time pt swallowing is deemed WNL/WFL with dys III/regular diet and thin liquids. SLP reviewed pt's individualized HEP for dysphagia and pt completed each exercise on their own with occasional min A. Pt is not completing HEP to recommended frequency. There are no overt s/s aspiration reported by pt at this time. Data indicate that pt's swallow could very well decline over time following conclusion of their radiation therapy due to muscle disuse atrophy and/or muscle fibrosis.  Pt will be discharged at this time; He stated he was comfortable completing HEP on his own and appears safe today with food items. He was provided with s/sx of swallowing difficulty that would warrant contacting his ENT or his referring MD.              Treatment/Interventions Aspiration precaution training;Pharyngeal strengthening exercises;Diet toleration management by SLP;Trials of upgraded texture/liquids;Patient/family education;SLP instruction and feedback;Compensatory techniques    Potential to Achieve Goals Good    SLP Home Exercise Plan provided       Patient will benefit from skilled therapeutic intervention in order to improve the following deficits and impairments:   Dysphagia, unspecified type    Problem List Patient Active Problem List   Diagnosis Date Noted   History of radiation to head and neck region 05/10/2021   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   Trismus 05/10/2021   Dysgeusia 05/10/2021   Atrial fibrillation with RVR ([place]) 03/05/2021   Port-A-Cath in place 02/05/2021   Head and neck cancer  ([place]) 01/01/2021   SPK (superficial punctate keratitis), left 12/13/2020   Dry eyes, bilateral 12/13/2020   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, [place], [place], [address] Phone: [phone]   Fax:  [phone]   Name: Nicholas Daniels MRN: [id number] Date of Birth: 02/14/1951

## 2021-09-16 NOTE — Patient Instructions (Addendum)
   Signs that you need to contact Dr. Isidore Moos or your ENT about your swallowing:  More frequent coughing or throat clearing with your meals or when drinking Harder time passing food through the throat with food, that was easy to pass through at one time in the past

## 2021-09-19 DIAGNOSIS — D6869 Other thrombophilia: Secondary | ICD-10-CM | POA: Diagnosis not present

## 2021-09-19 DIAGNOSIS — I700 Atherosclerosis of aorta: Secondary | ICD-10-CM | POA: Diagnosis not present

## 2021-09-19 DIAGNOSIS — I951 Orthostatic hypotension: Secondary | ICD-10-CM | POA: Diagnosis not present

## 2021-09-19 DIAGNOSIS — C61 Malignant neoplasm of prostate: Secondary | ICD-10-CM | POA: Diagnosis not present

## 2021-09-19 DIAGNOSIS — K219 Gastro-esophageal reflux disease without esophagitis: Secondary | ICD-10-CM | POA: Diagnosis not present

## 2021-09-19 DIAGNOSIS — I480 Paroxysmal atrial fibrillation: Secondary | ICD-10-CM | POA: Diagnosis not present

## 2021-09-19 DIAGNOSIS — E43 Unspecified severe protein-calorie malnutrition: Secondary | ICD-10-CM | POA: Diagnosis not present

## 2021-10-02 ENCOUNTER — Telehealth: Payer: Self-pay | Admitting: Cardiology

## 2021-10-02 MED ORDER — METOPROLOL TARTRATE 25 MG PO TABS: 25.0000 mg | ORAL_TABLET | Freq: Three times a day (TID) | ORAL | 3 refills | Status: DC

## 2021-10-02 NOTE — Telephone Encounter (Signed)
Rx request sent to pharmacy.  

## 2021-10-02 NOTE — Telephone Encounter (Signed)
*  STAT* If patient is at the pharmacy, call can be transferred to refill team.   1. Which medications need to be refilled? (please list name of each medication and dose if known) metoprolol tartrate (LOPRESSOR) 25 MG tablet  2. Which pharmacy/location (including street and city if local pharmacy) is medication to be sent to? [facility] # [place], [place]  3. Do they need a 30 day or 90 day supply? 30 day   Patient is completely out of medication.

## 2021-10-11 ENCOUNTER — Ambulatory Visit (INDEPENDENT_AMBULATORY_CARE_PROVIDER_SITE_OTHER): Payer: PPO | Admitting: Cardiology

## 2021-10-11 VITALS — BP 100/67 | HR 81 | Ht 67.0 in | Wt 166.6 lb

## 2021-10-11 DIAGNOSIS — I4819 Other persistent atrial fibrillation: Secondary | ICD-10-CM | POA: Diagnosis not present

## 2021-10-11 DIAGNOSIS — Z7901 Long term (current) use of anticoagulants: Secondary | ICD-10-CM

## 2021-10-11 DIAGNOSIS — D6869 Other thrombophilia: Secondary | ICD-10-CM | POA: Diagnosis not present

## 2021-10-11 DIAGNOSIS — C760 Malignant neoplasm of head, face and neck: Secondary | ICD-10-CM

## 2021-10-11 NOTE — Patient Instructions (Signed)

## 2021-10-11 NOTE — Progress Notes (Signed)
Cardiology Office Note:    Date:  10/11/2021   ID:  Nicholas Daniels, DOB 07-03-1950, MRN [id number]  PCP:  Nicholas Lopes, MD  Cardiologist:  Buford Dresser, MD  Referring MD: Nicholas Lopes, MD   CC: follow up  History of Present Illness:    Nicholas Daniels is a 71 y.o. 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 I met him during his recent hospitalization (discharged 03/12/21).  At his last appointment, he was in remission, experiencing significant fatigue, and daytime somnolence. He would have an occasional dizzy spell. He was using a smart watch to monitor his heart rate. He was also working on increasing his exercise. He was also receiving his nutrition via oral intake and PEG tube. His regimen included Eliquis, diltiazem, and metoprolol.   Today, he is accompanied by his wife. He is in Afib in the clinic today but he is not able to tell. At home and in clinic his blood pressure is typically low.  He mentions he has lost some weight and his appetite is good (although he hates the taste of most foods). In 2/23 he would experience some nausea but not any since. He complains of fatigue during the day but has no trouble sleeping at night. He does experience some "sleepy spells" during the day which his wife believes could be due to low blood pressure.   There was an incident in 2/23 where he felt especially lightheaded while driving. He does continue to experience some lightheadedness once in a while.  He has been taking his medication; as of 08/2021 his PEG tube was removed.   He denies any palpitations, chest pain, shortness of breath, or peripheral edema. No headaches, syncope, orthopnea, or PND.   Past Medical History:  Diagnosis Date   Atrial fibrillation ([place])    Cataract    removed bilateraly   Coronary artery calcification seen on CT  scan    Cough    Essential hypertension    Glaucoma    Head and neck cancer (HCC)    Hypertension    PFO (patent foramen ovale)    PFO noted on coronary CT 09/05/14   Prostate cancer ([place]) 2009    Past Surgical History:  Procedure Laterality Date   CATARACT EXTRACTION     bilateral and a wrinkle on retina on the left    CHOLECYSTECTOMY     COLONOSCOPY     DIRECT LARYNGOSCOPY N/A 12/17/2020   Procedure: DIRECT LARYNGOSCOPY WITH BIOPSIES;  Surgeon: Izora Gala, MD;  Location: [place];  Service: ENT;  Laterality: N/A;   ESOPHAGOSCOPY N/A 12/17/2020   Procedure: ESOPHAGOSCOPY;  Surgeon: Izora Gala, MD;  Location: [place];  Service: ENT;  Laterality: N/A;   IR GASTROSTOMY TUBE MOD SED  01/28/2021   IR GASTROSTOMY TUBE REMOVAL  08/29/2021   IR IMAGING GUIDED PORT INSERTION  01/28/2021   IR REMOVAL TUN ACCESS W/ PORT W/O FL MOD SED  08/29/2021   PROSTATE SURGERY  2009   radiation treatment     RADICAL NECK DISSECTION Left 12/17/2020   Procedure: LEFT NECK DISSECTION;  Surgeon: Izora Gala, MD;  Location: [place];  Service: ENT;  Laterality: Left;   RESECTION OF MEDIASTINAL MASS N/A 02/26/2015   Procedure: RESECTION OF MEDIASTINAL MASS;  Surgeon: Ivin Poot, MD;  Location: [place];  Service: Thoracic;  Laterality: N/A;  STERNOTOMY N/A 02/26/2015   Procedure: STERNOTOMY;  Surgeon: Ivin Poot, MD;  Location: [facility] OR;  Service: Thoracic;  Laterality: N/A;   UPPER GASTROINTESTINAL ENDOSCOPY      Current Medications: Current Outpatient Medications on File Prior to Visit  Medication Sig   apixaban (ELIQUIS) 5 MG TABS tablet Place 1 tablet (5 mg total) into feeding tube 2 (two) times daily.   diltiazem (CARDIZEM CD) 120 MG 24 hr capsule Take 1 capsule (120 mg total) by mouth in the morning and at bedtime.   latanoprost (XALATAN) 0.005 % ophthalmic solution Place 1 drop into both eyes at bedtime.   metoprolol tartrate (LOPRESSOR) 25 MG tablet Take 1 tablet (25 mg total) by mouth 3 (three)  times daily.   Multiple Vitamin (MULTIVITAMIN) tablet Take 1 tablet by mouth at bedtime.   Nutritional Supplements (FEEDING SUPPLEMENT, OSMOLITE 1.5 CAL,) LIQD Osmolite 1.5 - Give 7 cartons split over four feedings/day via PEG. Flush tube with 90 ml water before and after each feeding. Drink by mouth or give via tube additional 2 cups fluids/day. This provides 2485 kcal, 104.3 g protein, 2467 ml total water. Meets 100% estimated needs.   timolol (TIMOPTIC) 0.5 % ophthalmic solution Place 1 drop into both eyes daily.   Vitamin D, Cholecalciferol, 50 MCG (2000 UT) CAPS Take 2,000 Units by mouth at bedtime.   No current facility-administered medications on file prior to visit.     Allergies:   Patient has no known allergies.   Social History   Tobacco Use   Smoking status: Never    Passive exposure: Past   Smokeless tobacco: Never  Vaping Use   Vaping Use: Never used  Substance Use Topics   Alcohol use: No   Drug use: No    Family History: family history includes COPD in his brother, father, and mother; Cancer in his brother; Colon polyps in an other family member; Diabetes in his mother; Heart disease in his brother and mother; Liver cancer in his brother; Pneumonia in his mother. There is no history of Esophageal cancer, Rectal cancer, or Stomach cancer.  ROS:   Please see the history of present illness. (+) Lightheadedness (+) Fatigue (+) Daytime somnolence All other systems are reviewed and negative.     EKGs/Labs/Other Studies Reviewed:    The following studies were reviewed today:  CT Chest 06/14/2021: FINDINGS: Cardiovascular: Ill-defined soft tissue stranding in the lower left cervical region, incompletely imaged (refer to contemporaneously obtained CT the neck for full description). Heart size is normal. There is no significant pericardial fluid, thickening or pericardial calcification. There is aortic atherosclerosis, as well as atherosclerosis of the great vessels  of the mediastinum and the coronary arteries, including calcified atherosclerotic plaque in the left anterior descending coronary artery. Right internal jugular single-lumen porta cath with tip terminating in the right atrium.   Mediastinum/Nodes: No pathologically enlarged mediastinal or hilar lymph nodes. Esophagus is unremarkable in appearance. No axillary lymphadenopathy.   Lungs/Pleura: There are some new areas of ground-glass attenuation and ill-defined areas of pleuroparenchymal nodularity in the apices of the lungs bilaterally, presumably evolving postradiation changes. No other suspicious appearing pulmonary nodules or masses are noted. No acute consolidative airspace disease. No pleural effusions.   Upper Abdomen: Aortic atherosclerosis. Status post cholecystectomy. Percutaneous gastrostomy tube with retention balloon in the distal body of the stomach.   Musculoskeletal: Median sternotomy wires. There are no aggressive appearing lytic or blastic lesions noted in the visualized portions of the skeleton.   IMPRESSION:  1. No definitive findings to suggest metastatic disease in the thorax. 2. Evolving postradiation changes in the lung apices bilaterally. 3. Aortic atherosclerosis, in addition to left anterior descending coronary artery disease. Please note that although the presence of coronary artery calcium documents the presence of coronary artery disease, the severity of this disease and any potential stenosis cannot be assessed on this non-gated CT examination. Assessment for potential risk factor modification, dietary therapy or pharmacologic therapy may be warranted, if clinically indicated. 4. Additional incidental findings, as above.   Aortic Atherosclerosis (ICD10-I70.0).  Echo 03/07/2021:  1. Left ventricular ejection fraction, by estimation, is 55 to 60%. The  left ventricle has normal function. The left ventricle has no regional  wall motion abnormalities.  Left ventricular diastolic function could not  be evaluated.   2. Right ventricular systolic function is normal. The right ventricular  size is normal. There is moderately elevated pulmonary artery systolic  pressure.   3. The mitral valve is normal in structure. Mild mitral valve  regurgitation. No evidence of mitral stenosis.   4. The aortic valve is tricuspid. Aortic valve regurgitation is not  visualized. No aortic stenosis is present.   5. The inferior vena cava is dilated in size with <50% respiratory  variability, suggesting right atrial pressure of 15 mmHg.    Cardiac CT 09/05/2014: ADDENDUM REPORT: 09/06/2014 09:27   ADDENDUM: There is a round peripherally calcified lesion the anterior mediastinum measuring 2.7 by 2.7 cm. This lesion demonstrates no post-contrast enhancement and is external to the pericardium indicated this is not and aneurysmal lesion. Differential remains a calcified lymph node versus calcified thymoma or teratoma. No aggressive features.  ADDENDUM REPORT: 09/05/2014 17:49 FINDINGS: A 120 kV prospective scan was triggered in the descending thoracic aorta at 111 HU's. Axial non-contrast 62mm slices were carried out through the heart. The data set was analyzed on a dedicated work station and scored using the Dawson. Gantry rotation speed was 270 msecs and collimation was .9 mm. No beta blockade and 0.4 mg of sl NTG was given. The 3D data set was reconstructed in 5% intervals of the 67-82 % of the R-R cycle. Diastolic phases were analyzed on a dedicated work station using MPR, MIP and VRT modes. The patient received 80 cc of contrast.   Aorta: Normal caliber. Mild diffuse calcifications in the aortic arch. No dissection.   Aortic Valve:  Trileaflet.  No calcifications.   Coronary Arteries:  Normal origin.  Right dominance.   RCA is a large vessel that gives rise to acute marginal branch, PDA and PLA. There is no plaque in RCA territory.   LM  is free of plaque.   LAD is a large caliber vessel that wraps around the apex and gives rise to two diagonal branches. There are two mild calcified plaques in the mid LAD with maximal stenosis 25-50%.   LCX artery is medium caliber vessel that gives rise to two obtuse marginal branches and has minimal plaque.   Other findings:   There is circular rim calcified mass measuring 27 x 28 x 34 mm present in the anterior mediastinum. The mass is located anteriorly to the ascending aorta, adjacent to the pericardium, but no signs of invasion into pericardium or any cardiac structures. For further characterization please see [facility] Radiology report.   Persistent foramen ovale present.  No ASD.   Normal pulmonary vein drainage (two on the right, two on the left).   IMPRESSION: 1. Coronary calcium score of 75.  This was 54 percentile for age and sex matched control.   2. Non-obstructive calcified plaque in mid LAD. Risk factor modification is recommended.   3. There is circular rim calcified mass measuring 27 x 28 x 34 mm present in the anterior mediastinum. The mass is located anteriorly to the ascending aorta, adjacent to the pericardium, but no signs of invasion into pericardium or any cardiac structures. For further characterization please see [facility] Radiology report.   4. No mass was identified within the pericardium. No coronary aneurysm was seen.   5. Present PFO.   EKG:  EKG is personally reviewed.   10/11/2021:  atrial fibrillation at 81 bpm, RBBB 07/01/2021: EKG was not ordered. 03/28/21: atrial fibrillation at 80 bpm  Recent Labs: 03/05/2021: Magnesium 1.9 03/07/2021: TSH 0.500 07/03/2021: ALT 21; BUN 19; Creatinine 0.99; Hemoglobin 13.4; Platelet Count 219; Potassium 4.5; Sodium 140   Recent Lipid Panel No results found for: "CHOL", "TRIG", "HDL", "CHOLHDL", "VLDL", "LDLCALC", "LDLDIRECT"  Physical Exam:    VS:  BP 100/67 (BP Location: Right Arm, Patient  Position: Sitting, Cuff Size: Normal)   Pulse 81   Ht _0  (1.702 m)   Wt 166 lb 9.6 oz (75.6 kg)   BMI 26.09 kg/m     Wt Readings from Last 3 Encounters:  10/11/21 166 lb 9.6 oz (75.6 kg)  08/29/21 168 lb (76.2 kg)  07/22/21 177 lb 12.8 oz (80.6 kg)    GEN: Well nourished, well developed in no acute distress HEENT: Normal, moist mucous membranes NECK: No JVD CARDIAC: Irregularly irregular, normal S1 and S2, no rubs or gallops. No murmur. VASCULAR: Radial and DP pulses 2+ bilaterally. No carotid bruits RESPIRATORY:  Clear to auscultation without rales, wheezing or rhonchi  ABDOMEN: Soft, non-tender, non-distended MUSCULOSKELETAL:  Ambulates independently SKIN: Warm and dry, no edema NEUROLOGIC:  Alert and oriented x 3. No focal neuro deficits noted. PSYCHIATRIC:  Normal affect    ASSESSMENT:    1. Persistent atrial fibrillation ([place])   2. Secondary hypercoagulable state ([place])   3. Long term current use of anticoagulant   4. Head and neck cancer ([place])    PLAN:    History of SCC of the head and neck, with chemo/radiation -now tolerating oral intake, PEG tube removed  Atrial fibrillation, initially with RVR -in rate controlled atrial fibrillation today -CHA2DS2/VAS Stroke Risk Points=2  -no current bleeding issues -continue DOAC -tolerating diltiazem 120 mg ER twice a day -next medication is to consolidate metoprolol -will contact me with low/high blood pressures or fast heart rates  Cardiac risk counseling and prevention recommendations: -recommend heart healthy/Mediterranean diet, with whole grains, fruits, vegetable, fish, lean meats, nuts, and olive oil. Limit salt. -recommend moderate walking, 3-5 times/week for 30-50 minutes each session. Aim for at least 150 minutes.week. Goal should be pace of 3 miles/hours, or walking 1.5 miles in 30 minutes -recommend avoidance of tobacco products. Avoid excess alcohol.  Plan for follow up: 6 months or sooner as  needed  Buford Dresser, MD, PhD, [facility]    Medication Adjustments/Labs and Tests Ordered: Current medicines are reviewed at length with the patient today.  Concerns regarding medicines are outlined above.   Orders Placed This Encounter  Procedures   EKG 12-Lead   No orders of the defined types were placed in this encounter.  Patient Instructions  Medication Instructions:  Your Physician recommend you continue on your current medication as directed.    *If you need a refill on  your cardiac medications before your next appointment, please call your pharmacy*   Lab Work: None ordered today   Testing/Procedures: None ordered today   Follow-Up: At [facility], you and your health needs are our priority.  As part of our continuing mission to provide you with exceptional heart care, we have created designated Provider Care Teams.  These Care Teams include your primary Cardiologist (physician) and Advanced Practice Providers (APPs -  Physician Assistants and Nurse Practitioners) who all work together to provide you with the care you need, when you need it.  We recommend signing up for the patient portal called "MyChart".  Sign up information is provided on this After Visit Summary.  MyChart is used to connect with patients for Virtual Visits (Telemedicine).  Patients are able to view lab/test results, encounter notes, upcoming appointments, etc.  Non-urgent messages can be sent to your provider as well.   To learn more about what you can do with MyChart, go to [email or web address].    Your next appointment:   6 month(s)  The format for your next appointment:   In Person  Provider:   Buford Dresser, MD{           I,Jenifer Velasquez,acting as a scribe for Buford Dresser, MD.,have documented all relevant documentation on the behalf of Buford Dresser, MD,as directed by  Buford Dresser, MD while in the  presence of Buford Dresser, MD.  I, Buford Dresser, MD, have reviewed all documentation for this visit. The documentation on 10/11/21 for the exam, diagnosis, procedures, and orders are all accurate and complete.   Signed, Buford Dresser, MD PhD 10/11/2021 7:25 PM     [facility]

## 2021-10-14 ENCOUNTER — Telehealth (HOSPITAL_BASED_OUTPATIENT_CLINIC_OR_DEPARTMENT_OTHER): Payer: Self-pay

## 2021-10-14 NOTE — Telephone Encounter (Signed)
-  Patient assistance application completed and placed up front for pt to pick up. -Pt and wife made aware

## 2021-10-28 ENCOUNTER — Other Ambulatory Visit: Payer: Self-pay

## 2021-11-05 ENCOUNTER — Other Ambulatory Visit: Payer: Self-pay

## 2021-11-08 IMAGING — US US BIOPSY LYMPH NODE
1 series · 14 of 22 positions shown · non-contrast
Comparison: [facility] PET-CT-08/31/2020;

INDICATION: History of prostate cancer, now with indeterminate left cervical
lymphadenopathy. Please perform ultrasound-guided biopsy for tissue
diagnostic purposes.

EXAM:
ULTRASOUND-GUIDED LEFT CERVICAL LYMPH NODE BIOPSY
TECHNIQUE: Informed written consent was obtained from the patient after a
discussion of the risks, benefits and alternatives to treatment.
Questions regarding the procedure were encouraged and answered.
Initial ultrasound scanning demonstrated an approximately 1.9 x
x 1.6 cm hypoechoic left cervical lymph node (images 2 through 5)
correlating with the lymph node seen on preceding PET-CT image 50,
series 4. an ultrasound image was saved for documentation purposes.
The procedure was planned. A timeout was performed prior to the
initiation of the procedure.

[Series 1: us core biopsy lym nod mc & wl · 14 of 22 slices shown]
[im 1/22]
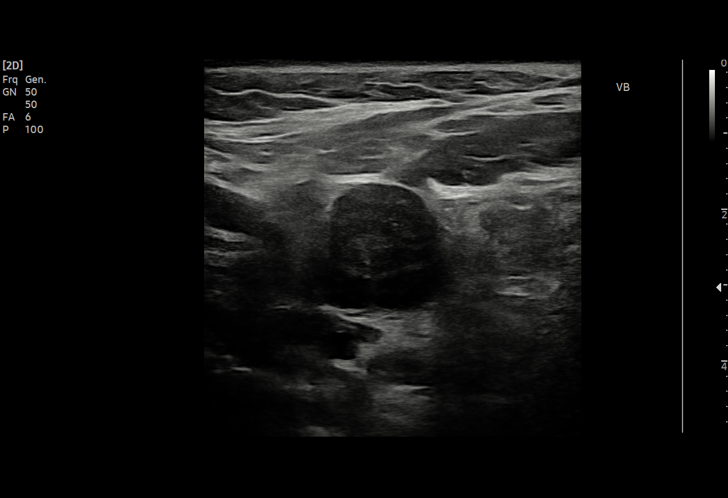
[im 3/22]
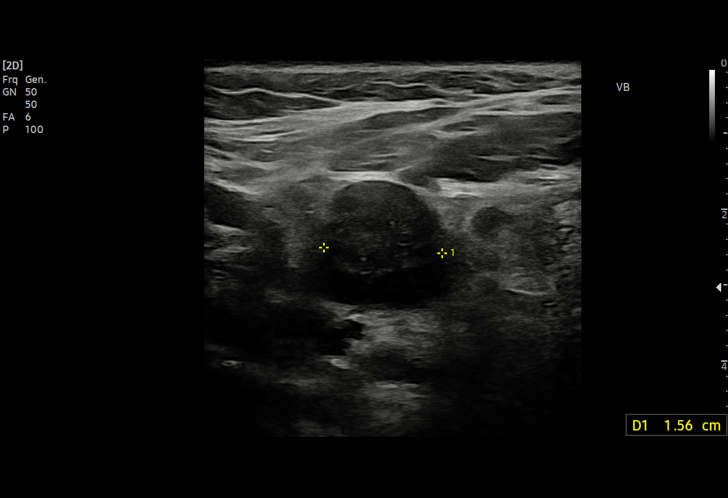
[im 4/22]
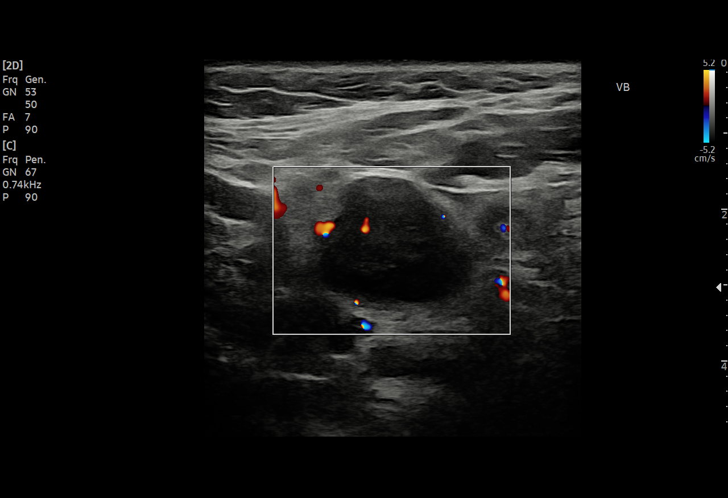
[im 6/22]
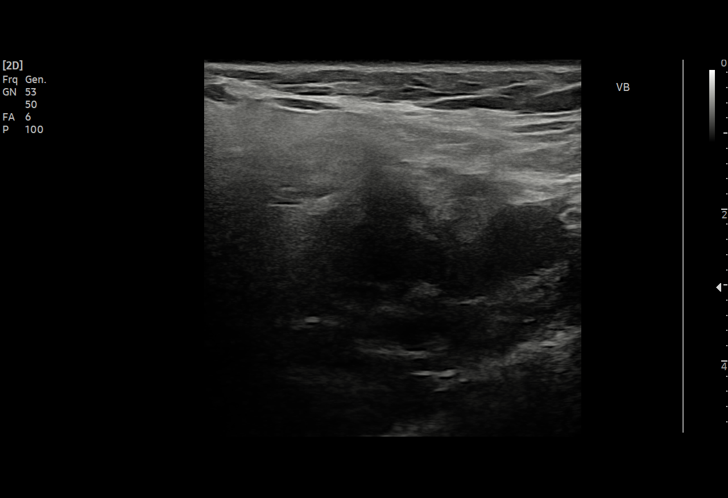
[im 8/22]
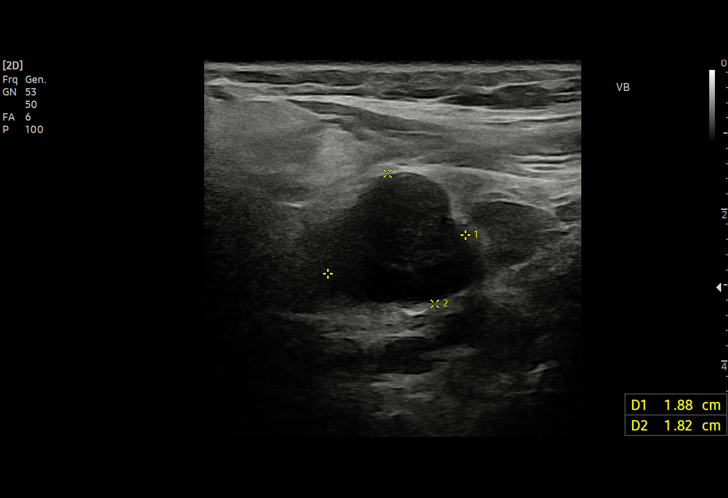
[im 9/22]
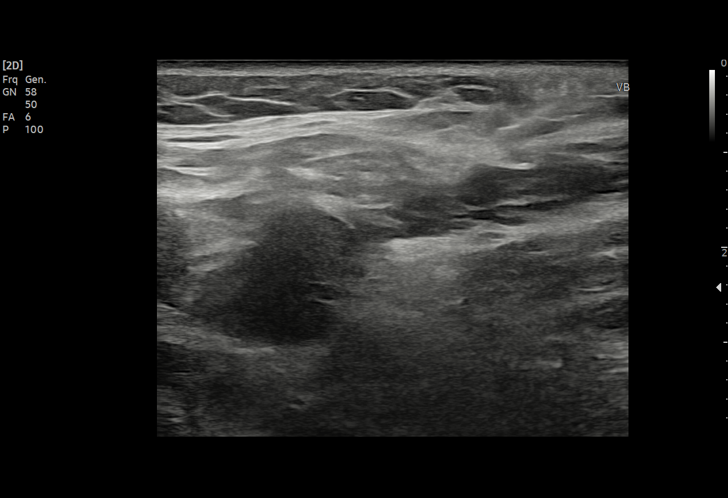
[im 11/22]
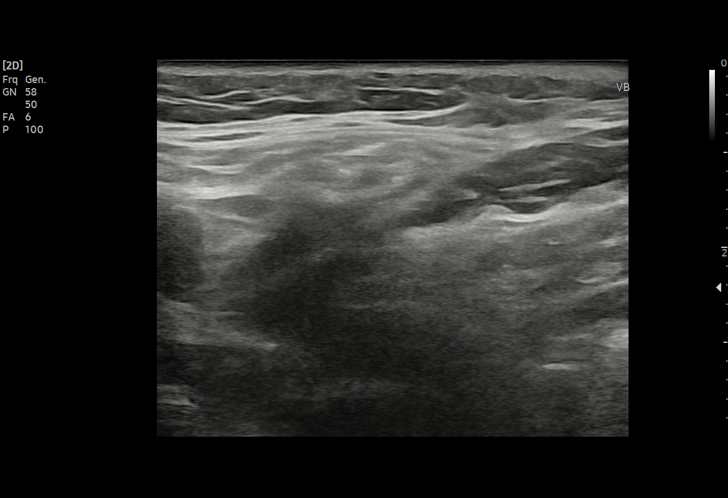
[im 12/22]
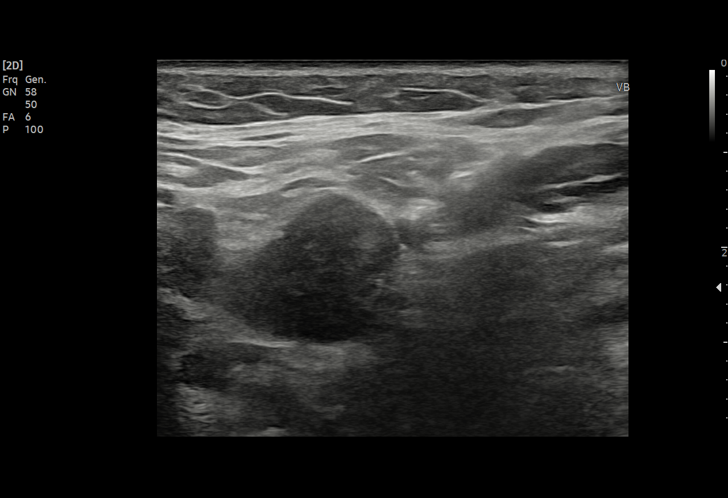
[im 14/22]
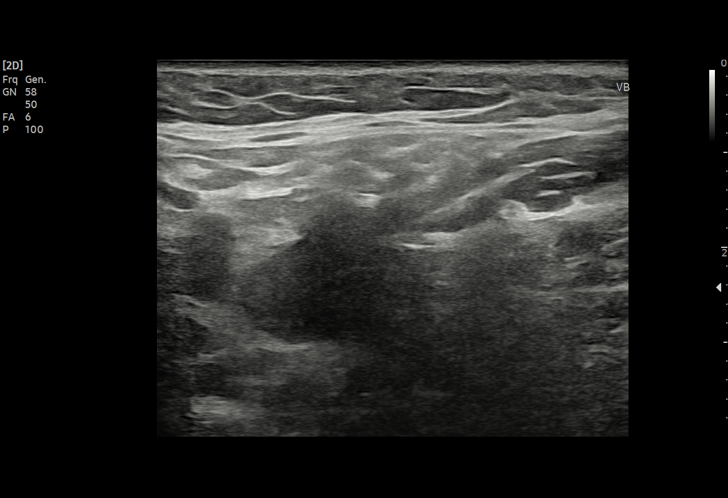
[im 15/22]
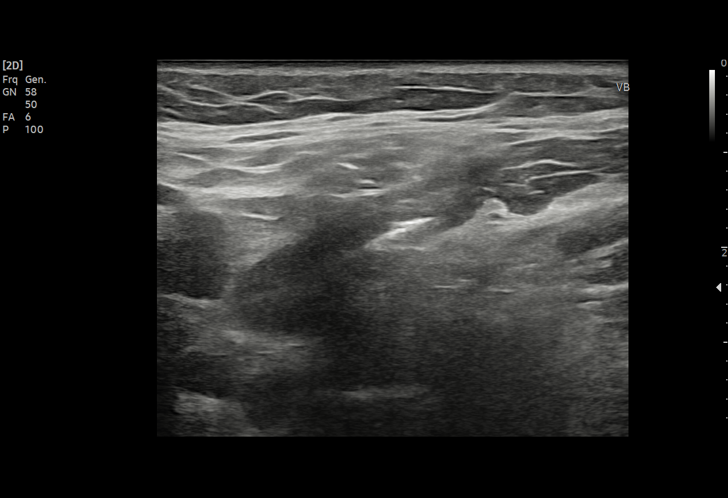
[im 17/22]
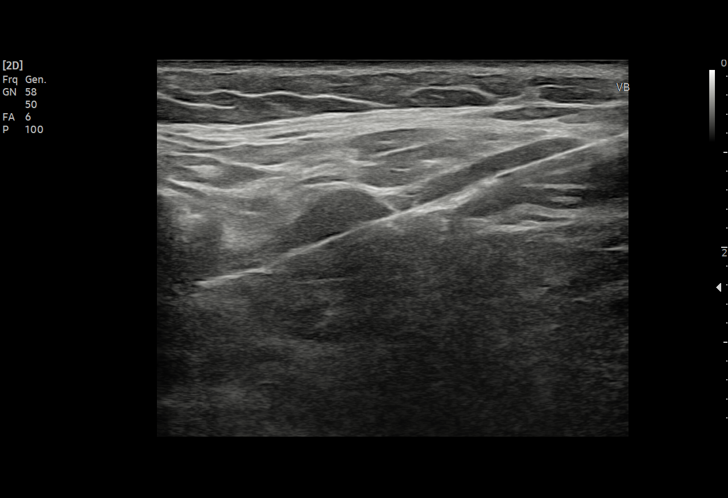
[im 19/22]
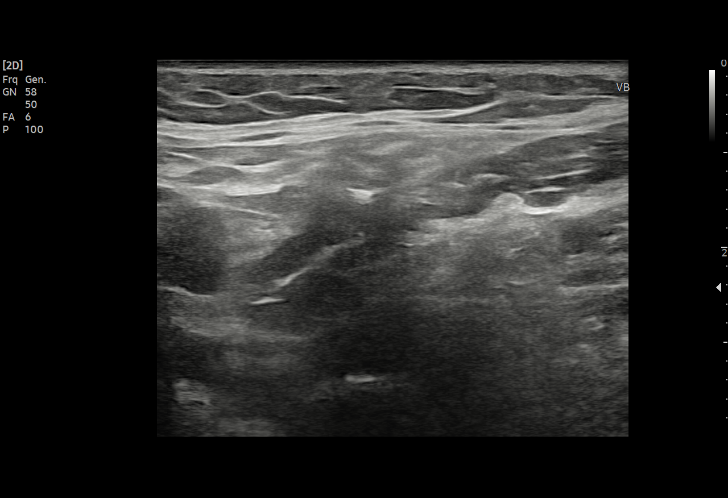
[im 20/22]
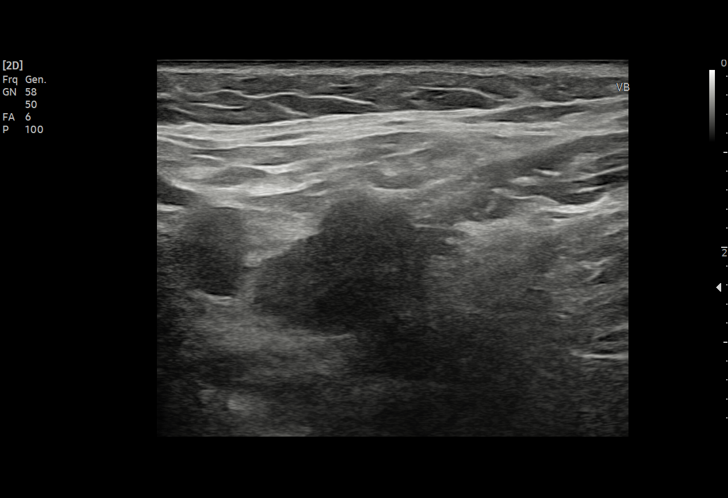
[im 22/22]
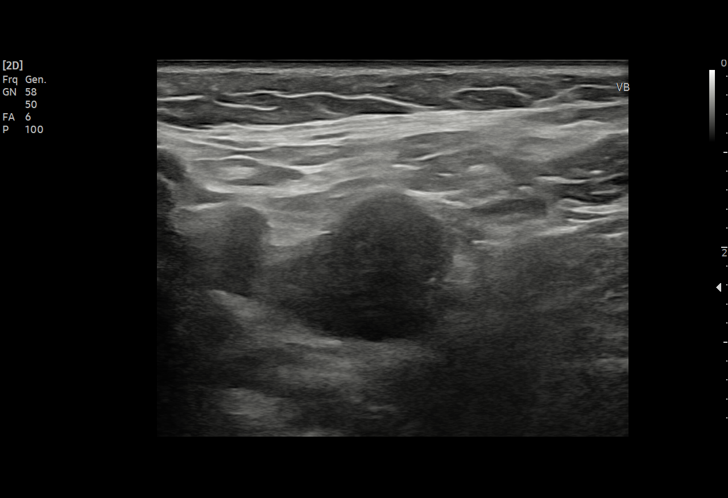

[14 of 22 positions shown; findings below may reference images not displayed]

soft tissue neck
ultrasound-09/20/2020

MEDICATIONS:
None

ANESTHESIA/SEDATION:
Moderate (conscious) sedation was employed during this procedure. A
total of Versed 2 mg and Fentanyl 100 mcg was administered
intravenously.

Moderate Sedation Time: 10 minutes. The patient's level of
consciousness and vital signs were monitored continuously by
radiology nursing throughout the procedure under my direct
supervision.

COMPLICATIONS:
None immediate.
The operative was prepped and draped in the usual sterile fashion,
and a sterile drape was applied covering the operative field. A
timeout was performed prior to the initiation of the procedure.
Local anesthesia was provided with 1% lidocaine with epinephrine.

Under direct ultrasound guidance, an 18 gauge core needle device was
utilized to obtain to obtain 6 core needle biopsies of the
indeterminate left cervical lymph node.

The samples were placed in saline and submitted to pathology. The
needle was removed and hemostasis was achieved with manual
compression. Post procedure scan was negative for significant
hematoma. A dressing was placed. The patient tolerated the procedure
well without immediate postprocedural complication.
IMPRESSION: Technically successful ultrasound guided biopsy of indeterminate
left cervical lymph node.

## 2021-11-11 DIAGNOSIS — C61 Malignant neoplasm of prostate: Secondary | ICD-10-CM | POA: Diagnosis not present

## 2021-11-18 DIAGNOSIS — C61 Malignant neoplasm of prostate: Secondary | ICD-10-CM | POA: Diagnosis not present

## 2021-11-18 DIAGNOSIS — N3941 Urge incontinence: Secondary | ICD-10-CM | POA: Diagnosis not present

## 2021-11-19 ENCOUNTER — Other Ambulatory Visit (HOSPITAL_COMMUNITY): Payer: Self-pay | Admitting: Urology

## 2021-11-19 DIAGNOSIS — R972 Elevated prostate specific antigen [PSA]: Secondary | ICD-10-CM

## 2021-11-21 ENCOUNTER — Other Ambulatory Visit: Payer: Self-pay | Admitting: Cardiology

## 2021-11-21 ENCOUNTER — Encounter (INDEPENDENT_AMBULATORY_CARE_PROVIDER_SITE_OTHER): Payer: PPO | Admitting: Ophthalmology

## 2021-11-21 NOTE — Telephone Encounter (Signed)
Rx request sent to pharmacy.  

## 2021-11-25 ENCOUNTER — Encounter (INDEPENDENT_AMBULATORY_CARE_PROVIDER_SITE_OTHER): Payer: PPO | Admitting: Ophthalmology

## 2021-11-26 ENCOUNTER — Encounter (HOSPITAL_COMMUNITY)
Admission: RE | Admit: 2021-11-26 | Discharge: 2021-11-26 | Disposition: A | Discharge status: Home / Self Care | Payer: PPO | Source: Ambulatory Visit | Attending: Urology | Admitting: Urology

## 2021-11-26 DIAGNOSIS — Z85828 Personal history of other malignant neoplasm of skin: Secondary | ICD-10-CM | POA: Diagnosis not present

## 2021-11-26 DIAGNOSIS — R972 Elevated prostate specific antigen [PSA]: Secondary | ICD-10-CM

## 2021-11-26 MED ORDER — PIFLIFOLASTAT F 18 (PYLARIFY) INJECTION
9.0000 | Freq: Once | INTRAVENOUS | Status: AC
  Administered 2021-11-26: 9.82 via INTRAVENOUS

## 2021-11-27 ENCOUNTER — Encounter (HOSPITAL_BASED_OUTPATIENT_CLINIC_OR_DEPARTMENT_OTHER): Payer: Self-pay | Admitting: Cardiology

## 2021-12-16 ENCOUNTER — Telehealth: Payer: Self-pay | Admitting: *Deleted

## 2021-12-16 NOTE — Telephone Encounter (Signed)
Pt would like to know if he still needs the CT CAP and neck? Or is the PET from 11/28/21 enough?

## 2021-12-17 ENCOUNTER — Encounter: Payer: Self-pay | Admitting: Oncology

## 2021-12-17 NOTE — Telephone Encounter (Signed)
LM that PET is enough. CT order to be cancelled

## 2021-12-23 ENCOUNTER — Ambulatory Visit (INDEPENDENT_AMBULATORY_CARE_PROVIDER_SITE_OTHER): Payer: PPO | Admitting: Ophthalmology

## 2021-12-23 ENCOUNTER — Encounter (INDEPENDENT_AMBULATORY_CARE_PROVIDER_SITE_OTHER): Payer: Self-pay | Admitting: Ophthalmology

## 2021-12-23 DIAGNOSIS — H35372 Puckering of macula, left eye: Secondary | ICD-10-CM

## 2021-12-23 DIAGNOSIS — H33102 Unspecified retinoschisis, left eye: Secondary | ICD-10-CM | POA: Diagnosis not present

## 2021-12-23 DIAGNOSIS — H35371 Puckering of macula, right eye: Secondary | ICD-10-CM

## 2021-12-23 DIAGNOSIS — H353131 Nonexudative age-related macular degeneration, bilateral, early dry stage: Secondary | ICD-10-CM | POA: Diagnosis not present

## 2021-12-23 NOTE — Assessment & Plan Note (Signed)
OD minor and no impact on acuity will observe

## 2021-12-23 NOTE — Progress Notes (Signed)
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 male who presents to the clinic today for:   HPI   7 MOS FU OU OCT Pt stated no changes in vision since last visit. Pt is currently taking latanoprost and timolol. LATANOPROST- 1 drop into both eyes daily TIMOLOL- 1 drop into both eyes daily.  Last edited by Silvestre Moment on 12/23/2021  2:26 PM.      Referring physician: [name], MD [place],  [place] [address]  HISTORICAL INFORMATION:   Selected notes from the MEDICAL RECORD NUMBER       CURRENT MEDICATIONS: Current Outpatient Medications (Ophthalmic Drugs)  Medication Sig   latanoprost (XALATAN) 0.005 % ophthalmic solution Place 1 drop into both eyes at bedtime.   timolol (TIMOPTIC) 0.5 % ophthalmic solution Place 1 drop into both eyes daily.   No current facility-administered medications for this visit. (Ophthalmic Drugs)   Current Outpatient Medications (Other)  Medication Sig   apixaban (ELIQUIS) 5 MG TABS tablet Place 1 tablet (5 mg total) into feeding tube 2 (two) times daily.   diltiazem (CARDIZEM CD) 120 MG 24 hr capsule Take 1 capsule (120 mg total) by mouth in the morning and at bedtime.   metoprolol tartrate (LOPRESSOR) 25 MG tablet TAKE ONE TABLET BY MOUTH THREE TIMES DAILY   Multiple Vitamin (MULTIVITAMIN) tablet Take 1 tablet by mouth at bedtime.   Nutritional Supplements (FEEDING SUPPLEMENT, OSMOLITE 1.5 CAL,) LIQD Osmolite 1.5 - Give 7 cartons split over four feedings/day via PEG. Flush tube with 90 ml water before and after each feeding. Drink by mouth or give via tube additional 2 cups fluids/day. This provides 2485 kcal, 104.3 g protein, 2467 ml total water.  Meets 100% estimated needs.   Vitamin D, Cholecalciferol, 50 MCG (2000 UT) CAPS Take 2,000 Units by mouth at bedtime.   No current facility-administered medications for this visit. (Other)      REVIEW OF SYSTEMS: ROS   Negative for: Constitutional, Gastrointestinal, Neurological, Skin, Genitourinary, Musculoskeletal, HENT, Endocrine, Cardiovascular, Eyes, Respiratory, Psychiatric, Allergic/Imm, Heme/Lymph Last edited by Hurman Horn, MD on 12/23/2021  2:46 PM.       ALLERGIES No Known Allergies  PAST MEDICAL HISTORY Past Medical History:  Diagnosis Date   Atrial fibrillation ([place])    Cataract    removed bilateraly   Coronary artery calcification seen on CT scan    Cough    Essential hypertension    Glaucoma    Head and neck cancer ([place])    Hypertension    PFO (patent foramen ovale)    PFO noted on coronary CT 09/05/14   Prostate cancer ([place]) 2009   Past Surgical History:  Procedure Laterality Date   CATARACT EXTRACTION     bilateral and a wrinkle on retina on the left    CHOLECYSTECTOMY     COLONOSCOPY     DIRECT LARYNGOSCOPY N/A 12/17/2020   Procedure: DIRECT LARYNGOSCOPY WITH BIOPSIES;  Surgeon: Izora Gala, MD;  Location: [facility];  Service: ENT;  Laterality: N/A;   ESOPHAGOSCOPY N/A 12/17/2020   Procedure: ESOPHAGOSCOPY;  Surgeon: Izora Gala, MD;  Location: [facility];  Service: ENT;  Laterality: N/A;   IR  GASTROSTOMY TUBE MOD SED  01/28/2021   IR GASTROSTOMY TUBE REMOVAL  08/29/2021   IR IMAGING GUIDED PORT INSERTION  01/28/2021   IR REMOVAL TUN ACCESS W/ PORT W/O FL MOD SED  08/29/2021   PROSTATE SURGERY  2009   radiation treatment     RADICAL NECK DISSECTION Left 12/17/2020   Procedure: LEFT NECK DISSECTION;  Surgeon: Izora Gala, MD;  Location: [facility];  Service: ENT;  Laterality: Left;   RESECTION OF MEDIASTINAL MASS N/A 02/26/2015   Procedure: RESECTION OF MEDIASTINAL MASS;  Surgeon: Ivin Poot, MD;  Location: [facility] OR;  Service: Thoracic;  Laterality: N/A;    STERNOTOMY N/A 02/26/2015   Procedure: STERNOTOMY;  Surgeon: Ivin Poot, MD;  Location: [facility] OR;  Service: Thoracic;  Laterality: N/A;   UPPER GASTROINTESTINAL ENDOSCOPY      FAMILY HISTORY Family History  Problem Relation Age of Onset   Heart disease Brother    COPD Brother    Cancer Brother        liver   Liver cancer Brother    Heart disease Mother        MI   COPD Mother    Pneumonia Mother    Diabetes Mother    COPD Father    Colon polyps Other    Esophageal cancer Neg Hx    Rectal cancer Neg Hx    Stomach cancer Neg Hx     SOCIAL HISTORY Social History   Tobacco Use   Smoking status: Never    Passive exposure: Past   Smokeless tobacco: Never  Vaping Use   Vaping Use: Never used  Substance Use Topics   Alcohol use: No   Drug use: No         OPHTHALMIC EXAM:  Base Eye Exam     Visual Acuity (ETDRS)       Right Left   Dist cc 20/20 -2 20/25 -1    Correction: Glasses         Tonometry (Tonopen, 2:31 PM)       Right Left   Pressure 11 13         Pupils       Pupils APD   Right PERRL None   Left PERRL None         Visual Fields       Left Right    Full Full         Extraocular Movement       Right Left    Full, Ortho Full, Ortho         Neuro/Psych     Oriented x3: Yes   Mood/Affect: Normal         Dilation     Both eyes: 1.0% Mydriacyl, 2.5% Phenylephrine @ 2:31 PM           Slit Lamp and Fundus Exam     External Exam       Right Left   External Normal Normal         Slit Lamp Exam       Right Left   Lids/Lashes Normal Normal   Conjunctiva/Sclera White and quiet White and quiet, no signs of conjunctivitis   Cornea Clear Fine SPK noted on the ocular surface of the left eye.     Anterior Chamber Deep and quiet Deep and quiet   Iris Round and reactive Round and reactive   Lens Posterior chamber intraocular lens Posterior chamber intraocular lens   Anterior Vitreous Normal  Normal          Fundus Exam       Right Left   Posterior Vitreous Normal Normal   Disc Normal Normal   C/D Ratio 0.25 0.3   Macula Normal, none foveal epiretinal membrane mild Epiretinal membrane, severe topographic distortion with pseudo CME   Vessels Normal Normal   Periphery Normal Normal            IMAGING AND PROCEDURES  Imaging and Procedures for 12/23/21  OCT, Retina - OU - Both Eyes       Right Eye Scan locations included subfoveal. Central Foveal Thickness: 284. Progression has been stable. Findings include epiretinal membrane.   Left Eye Quality was good. Scan locations included subfoveal. Central Foveal Thickness: 480. Progression has been stable. Findings include epiretinal membrane.   Notes Minor epiretinal membrane with no topographic distortion OD, nasal and superior   OS with increased thickening appearance and retinal thickening of the left eye from epiretinal membrane, what is new and now slightly progressed however is a small outer retinal nodule of subretinal debris, this could be a change in the photoreceptor layer.  With increased thickening, consider surgical undertaken left eye once patient's systemic issues, cancer therapy are completed             ASSESSMENT/PLAN:  Right epiretinal membrane OD minor and no impact on acuity will observe  Macular retinoschisis, left Condition secondary to severe epiretinal membrane.  Left epiretinal membrane Increased thickness in the left eye with secondary macular retinoschisis triggering Central for permanent vision loss.  OS to stabilize more importantly improve acuity will recommend vitrectomy membrane peel left eye    Early stage nonexudative age-related macular degeneration of both eyes Minor changes OU     ICD-10-CM   1. Early stage nonexudative age-related macular degeneration of both eyes  H35.3131 OCT, Retina - OU - Both Eyes    2. Right epiretinal membrane  H35.371     3. Macular retinoschisis, left   H33.102     4. Left epiretinal membrane  H35.372       OS with severe epiretinal membrane foveal macular distortion and schisis triggering potential for long-term vision loss even with surgical intervention.  Surgical intervention hopefully will halt that progressive vision loss, restore more normal anatomy and given the only chance for visual acuity covering the left eye.  2.  Risk benefits reviewed with the patient.  Discussion of the course of clinical course of surgery and its aftercare reviewed  3.  Ophthalmic Meds Ordered this visit:  No orders of the defined types were placed in this encounter.      Return ,, [facility], for Schedule vitrectomy membrane peel left eye, [id number], OS.  There are no Patient Instructions on file for this visit.   Explained the diagnoses, plan, and follow up with the patient and they expressed understanding.  Patient expressed understanding of the importance of proper follow up care.   Clent Demark Beatrice Sehgal M.D. Diseases & Surgery of the Retina and Vitreous [facility] [place] 12/23/21     Abbreviations: M myopia (nearsighted); A astigmatism; H hyperopia (farsighted); P presbyopia; Mrx spectacle prescription;  CTL contact lenses; OD right eye; OS left eye; OU both eyes  XT exotropia; ET esotropia; PEK punctate epithelial keratitis; PEE punctate epithelial erosions; DES dry eye syndrome; MGD meibomian gland dysfunction; ATs artificial tears; PFAT's preservative free artificial tears; [place] nuclear sclerotic cataract; PSC posterior subcapsular cataract; ERM epi-retinal membrane;  PVD posterior vitreous detachment; RD retinal detachment; DM diabetes mellitus; DR diabetic retinopathy; NPDR non-proliferative diabetic retinopathy; PDR proliferative diabetic retinopathy; CSME clinically significant macular edema; DME diabetic macular edema; dbh dot blot hemorrhages; CWS cotton wool spot; POAG primary open angle glaucoma; C/D cup-to-disc ratio;  HVF humphrey visual field; GVF goldmann visual field; OCT optical coherence tomography; IOP intraocular pressure; BRVO Branch retinal vein occlusion; CRVO central retinal vein occlusion; CRAO central retinal artery occlusion; BRAO branch retinal artery occlusion; RT retinal tear; SB scleral buckle; PPV pars plana vitrectomy; VH Vitreous hemorrhage; PRP panretinal laser photocoagulation; IVK intravitreal kenalog; VMT vitreomacular traction; MH Macular hole;  NVD neovascularization of the disc; NVE neovascularization elsewhere; AREDS age related eye disease study; ARMD age related macular degeneration; POAG primary open angle glaucoma; EBMD epithelial/anterior basement membrane dystrophy; ACIOL anterior chamber intraocular lens; IOL intraocular lens; PCIOL posterior chamber intraocular lens; Phaco/IOL phacoemulsification with intraocular lens placement; [place] photorefractive keratectomy; LASIK laser assisted in situ keratomileusis; HTN hypertension; DM diabetes mellitus; COPD chronic obstructive pulmonary disease

## 2021-12-23 NOTE — Assessment & Plan Note (Signed)
Minor changes OU

## 2021-12-23 NOTE — Assessment & Plan Note (Signed)
Condition secondary to severe epiretinal membrane.

## 2021-12-23 NOTE — Assessment & Plan Note (Addendum)
Increased thickness in the left eye with secondary macular retinoschisis triggering Central for permanent vision loss.  OS to stabilize more importantly improve acuity will recommend vitrectomy membrane peel left eye

## 2021-12-23 NOTE — Patient Instructions (Signed)
To call the week before next scheduled appointment to confirm the patient is helping with bandage and/or ability to be seen

## 2021-12-25 ENCOUNTER — Other Ambulatory Visit: Payer: PPO

## 2022-01-01 ENCOUNTER — Ambulatory Visit: Payer: PPO | Admitting: Oncology

## 2022-01-24 ENCOUNTER — Inpatient Hospital Stay: Payer: PPO | Attending: Oncology | Admitting: Oncology

## 2022-01-24 VITALS — BP 115/73 | HR 77 | Temp 97.8°F | Resp 17 | Ht 67.0 in | Wt 161.1 lb

## 2022-01-24 DIAGNOSIS — Z79899 Other long term (current) drug therapy: Secondary | ICD-10-CM | POA: Insufficient documentation

## 2022-01-24 DIAGNOSIS — I4891 Unspecified atrial fibrillation: Secondary | ICD-10-CM | POA: Diagnosis not present

## 2022-01-24 DIAGNOSIS — Z923 Personal history of irradiation: Secondary | ICD-10-CM | POA: Diagnosis not present

## 2022-01-24 DIAGNOSIS — C770 Secondary and unspecified malignant neoplasm of lymph nodes of head, face and neck: Secondary | ICD-10-CM

## 2022-01-24 DIAGNOSIS — Z8546 Personal history of malignant neoplasm of prostate: Secondary | ICD-10-CM | POA: Diagnosis not present

## 2022-01-24 DIAGNOSIS — Z7901 Long term (current) use of anticoagulants: Secondary | ICD-10-CM | POA: Insufficient documentation

## 2022-01-24 DIAGNOSIS — C760 Malignant neoplasm of head, face and neck: Secondary | ICD-10-CM

## 2022-01-24 DIAGNOSIS — C61 Malignant neoplasm of prostate: Secondary | ICD-10-CM | POA: Insufficient documentation

## 2022-01-24 DIAGNOSIS — Z9079 Acquired absence of other genital organ(s): Secondary | ICD-10-CM | POA: Insufficient documentation

## 2022-01-24 NOTE — Telephone Encounter (Addendum)
Received fax from [facility] stating pt was denied due to not meeting 3% out of pocket expense. Pt and wife made aware

## 2022-01-24 NOTE — Progress Notes (Signed)
Hematology and Oncology Follow Up Visit  Nicholas Daniels [id number] May 27, 1950 71 y.o. 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     2.   Prostate cancer diagnosed in 2009.  He was found to have T3N0 Gleason score 3+4 = 7 and currently with biochemical relapse.  Prior Therapy:  He underwent prostatectomy and found to have a T3a N0 Gleason score 3+4 = 7 cancer.  He underwent salvage radiation in 2012.   He developed a rise in his PSA and enrolled in the [employer] trial.   He was off enzalutamide since August 2021 and off clinical trial at that time.  His PSA started to rise in the last year with a PSA in May 2022 was 0.56.  Previously was 0.35 and it was undetectable in May 2021.  Prior to enzalutamide his PSA was as high as 8.27 and declined to undetectable level.  He is of any prostate cancer treatment at this time.  He is status post direct laryngoscopy and left neck dissection completed by Dr. Constance Daniels on December 17, 2020.  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    Adjuvant radiation therapy with weekly cisplatin started on February 05, 2021.  He completed adjuvant radiation therapy with cisplatin in November 2022.  Current therapy: Active surveillance  Interim History: Nicholas Daniels returns today for repeat evaluation.  Since the last visit, he reports feeling well.  He have his Port-A-Cath G-tube removed without any complications.  He is able to maintain his nutrition with oral intake.  He denies any nausea, vomiting or abdominal pain.  He denies any lymphadenopathy.  He has reported some occasional dizziness and lightheadedness related to his A-fib.     Medications: Updated on review. Current Outpatient Medications  Medication Sig  Dispense Refill   apixaban (ELIQUIS) 5 MG TABS tablet Place 1 tablet (5 mg total) into feeding tube 2 (two) times daily. 60 tablet 4   diltiazem (CARDIZEM CD) 120 MG 24 hr capsule Take 1 capsule (120 mg total) by mouth in the morning and at bedtime. 180 capsule 3   latanoprost (XALATAN) 0.005 % ophthalmic solution Place 1 drop into both eyes at bedtime.     metoprolol tartrate (LOPRESSOR) 25 MG tablet TAKE ONE TABLET BY MOUTH THREE TIMES DAILY 30 tablet 3   Multiple Vitamin (MULTIVITAMIN) tablet Take 1 tablet by mouth at bedtime.     Nutritional Supplements (FEEDING SUPPLEMENT, OSMOLITE 1.5 CAL,) LIQD Osmolite 1.5 - Give 7 cartons split over four feedings/day via PEG. Flush tube with 90 ml water before and after each feeding. Drink by mouth or give via tube additional 2 cups fluids/day. This provides 2485 kcal, 104.3 g protein, 2467 ml total water. Meets 100% estimated needs. 1659 mL    timolol (TIMOPTIC) 0.5 % ophthalmic solution Place 1 drop into both eyes daily.     Vitamin D, Cholecalciferol, 50 MCG (2000 UT) CAPS Take 2,000 Units by mouth at bedtime.     No current facility-administered medications for this visit.     Allergies: No Known Allergies   Physical Exam:  Blood pressure 115/73, pulse 77, temperature 97.8 F (36.6 C), temperature source Temporal, resp. rate 17, height '5\' 7"'$  (1.702 m), weight 161 lb 1.6 oz (73.1 kg), SpO2 100 %.    ECOG:  1   General appearance: Alert, awake without any distress. Head: Atraumatic without abnormalities Oropharynx: Without any thrush or ulcers. Eyes: No scleral icterus. Lymph nodes: No lymphadenopathy noted in the cervical, supraclavicular, or axillary nodes Heart: Regular without murmurs.  Rate is controlled.   Lung: Clear to auscultation without any rhonchi, wheezes or dullness to percussion. Abdomin: Soft, nontender without any shifting dullness or ascites. Musculoskeletal: No clubbing or cyanosis. Neurological: No motor or sensory  deficits. Skin: No rashes or lesions. Psychiatric: Mood and affect appeared normal.        Lab Results: Lab Results  Component Value Date   WBC 4.4 07/03/2021   HGB 13.4 07/03/2021   HCT 39.3 07/03/2021   MCV 94.0 07/03/2021   PLT 219 07/03/2021     Chemistry      Component Value Date/Time   NA 140 07/03/2021 1325   K 4.5 07/03/2021 1325   CL 106 07/03/2021 1325   CO2 30 07/03/2021 1325   BUN 19 07/03/2021 1325   CREATININE 0.99 07/03/2021 1325   CREATININE 1.10 02/13/2015 1545      Component Value Date/Time   CALCIUM 9.5 07/03/2021 1325   ALKPHOS 27 (L) 07/03/2021 1325   AST 17 07/03/2021 1325   ALT 21 07/03/2021 1325   BILITOT 0.9 07/03/2021 1325     IMPRESSION: No signs of PSMA avid disease recurrence following prostatectomy.   Postoperative changes in the LEFT neck with diminished stranding in the subcutaneous fat. Otherwise grossly stable morphologic changes related to post treatment effect.   Mild bladder wall thickening and perivesical stranding, urinary bladder is under distended and findings are nonspecific. Correlate with any signs of cystitis.   Aortic atherosclerosis, calcified coronary artery disease cholecystectomy changes as described.     Assessment and Plan:      71 year old with:  1.  Squamous cell carcinoma of unknown primary presented with cervical adenopathy in September 2022.  He was found to have stage IV at that time and achieved complete response to therapy.   He is currently on active surveillance without any evidence of relapse.  PET scan obtained on November 26, 2021 did not show any evidence of metastatic disease of relapse.  At this time I recommended continued monitoring with repeat imaging in 6 months.   2.  Prostate cancer diagnosed in 2012.  He is developing biochemical relapse without any evidence of measurable disease based on pet imaging in August 2023.  He continues to follow with Nicholas Daniels regarding this issue.   He  has not been started on androgen deprivation therapy at this time.   3.  IV access: Port-A-Cath removed without any complications.   4.  Nutritional status: Nutritional status is adequate and does not require any feeding tube at this time.  PEG has been removed.  5.  Atrial fibrillation: He continues to follow with cardiology regarding this issue.   6.   Follow-up: In 6 months for a follow-up visit.   30  minutes were spent on this encounter.  The time was dedicated to reviewing laboratory data, disease status update and outlining future plan of care review.   Zola Button, Daniels 10/20/20238:48 AM

## 2022-01-31 ENCOUNTER — Telehealth (HOSPITAL_BASED_OUTPATIENT_CLINIC_OR_DEPARTMENT_OTHER): Payer: Self-pay | Admitting: Cardiology

## 2022-01-31 DIAGNOSIS — R3129 Other microscopic hematuria: Secondary | ICD-10-CM | POA: Diagnosis not present

## 2022-01-31 LAB — CBC
Hematocrit: 40.2 % (ref 37.5–51.0)
Hemoglobin: 13.9 g/dL (ref 13.0–17.7)
MCH: 31 pg (ref 26.6–33.0)
MCHC: 34.6 g/dL (ref 31.5–35.7)
MCV: 90 fL (ref 79–97)
Platelets: 250 10*3/uL (ref 150–450)
RBC: 4.49 x10E6/uL (ref 4.14–5.80)
RDW: 12.4 % (ref 11.6–15.4)
WBC: 4.3 10*3/uL (ref 3.4–10.8)

## 2022-01-31 NOTE — Telephone Encounter (Signed)
Returned call to to patient. Patient states that he went to the restroom  a few minutes ago, he noticed that his underwear were stained red, and then he went to the restroom and his urine had a pink tinge to it.   He does have an appointment on Monday.   Spoke with Dr. Harrell Gave in clinic, order CBC and advise patient to have drawn, and as long as it isn't getting worse over the weekend we will see him Monday.

## 2022-01-31 NOTE — Telephone Encounter (Signed)
Pt c/o medication issue:  1. Name of Medication: Eliquis  2. How are you currently taking this medication (dosage and times per day)?  2 times a day  3. Are you having a reaction (difficulty breathing--STAT)?   4. What is your medication issue? A little blood in his urine

## 2022-02-03 ENCOUNTER — Encounter (HOSPITAL_BASED_OUTPATIENT_CLINIC_OR_DEPARTMENT_OTHER): Payer: Self-pay | Admitting: Cardiology

## 2022-02-03 ENCOUNTER — Ambulatory Visit (INDEPENDENT_AMBULATORY_CARE_PROVIDER_SITE_OTHER): Payer: PPO | Admitting: Cardiology

## 2022-02-03 VITALS — BP 94/72 | HR 70 | Ht 67.0 in | Wt 162.0 lb

## 2022-02-03 DIAGNOSIS — I4819 Other persistent atrial fibrillation: Secondary | ICD-10-CM | POA: Diagnosis not present

## 2022-02-03 DIAGNOSIS — D6869 Other thrombophilia: Secondary | ICD-10-CM

## 2022-02-03 DIAGNOSIS — Z7901 Long term (current) use of anticoagulants: Secondary | ICD-10-CM

## 2022-02-03 DIAGNOSIS — C760 Malignant neoplasm of head, face and neck: Secondary | ICD-10-CM | POA: Diagnosis not present

## 2022-02-03 MED ORDER — METOPROLOL TARTRATE 25 MG PO TABS: 25.0000 mg | ORAL_TABLET | Freq: Two times a day (BID) | ORAL | 11 refills | Status: DC

## 2022-02-03 NOTE — Patient Instructions (Signed)
Medication Instructions:  None  *If you need a refill on your cardiac medications before your next appointment, please call your pharmacy*   Lab Work: None  Testing/Procedures: None   Follow-Up: At [facility], you and your health needs are our priority.  As part of our continuing mission to provide you with exceptional heart care, we have created designated Provider Care Teams.  These Care Teams include your primary Cardiologist (physician) and Advanced Practice Providers (APPs -  Physician Assistants and Nurse Practitioners) who all work together to provide you with the care you need, when you need it.  We recommend signing up for the patient portal called "MyChart".  Sign up information is provided on this After Visit Summary.  MyChart is used to connect with patients for Virtual Visits (Telemedicine).  Patients are able to view lab/test results, encounter notes, upcoming appointments, etc.  Non-urgent messages can be sent to your provider as well.   To learn more about what you can do with MyChart, go to [email or web address].    Your next appointment:   3 month(s)  The format for your next appointment:   In Person  Provider:   Buford Dresser, MD    Other Instructions None

## 2022-02-03 NOTE — Progress Notes (Signed)
Cardiology Office Note:    Date:  02/03/2022   ID:  Nicholas Daniels, Nicholas Daniels Aug 11, 1950, MRN [id number]  PCP:  [name], MD  Cardiologist:  Buford Dresser, MD  Referring MD: [name], MD   CC: follow up  History of Present Illness:    Nicholas Daniels is a 71 y.o. 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 I met him during his prior hospitalization (discharged 03/12/21).  Today:  He is accompanied by his wife. He says he is feeling okay with his low blood pressures.   He reports that on Friday, he went to the bathroom and noticed some red tinged urine. This resolved within the day it occurred. He experienced no pain during this. We reviewed his CBC, which was stable.  He states that throughout the day he will sometimes experience a near dizzy sensation where he needs to just sit and wait until it subsides.   His blood pressures at home have been averaging the upper 90s to low 100s. His lowest blood pressure reading recorded was 89/65, and his highest reading recorded was 120/78. His wife reports that on the day his blood pressure was 120/78, he'd forgotten to take his medication.   He reports that he drinks a lot of water throughout the day. Food is beginning to taste better to him as well.   He is asking about ways to manage his afib. Discussed in depth, see below.  He denies any palpitations, chest pain, shortness of breath, or peripheral edema. No headaches, syncope, orthopnea, or PND.  Past Medical History:  Diagnosis Date   Atrial fibrillation [facility])    Cataract    removed bilateraly   Coronary artery calcification seen on CT scan    Cough    Essential hypertension    Glaucoma    Head and neck cancer (HCC)    Hypertension    PFO (patent foramen ovale)    PFO noted on coronary CT 09/05/14   Prostate cancer ([place]) 2009     Past Surgical History:  Procedure Laterality Date   CATARACT EXTRACTION     bilateral and a wrinkle on retina on the left    CHOLECYSTECTOMY     COLONOSCOPY     DIRECT LARYNGOSCOPY N/A 12/17/2020   Procedure: DIRECT LARYNGOSCOPY WITH BIOPSIES;  Surgeon: Izora Gala, MD;  Location: [place];  Service: ENT;  Laterality: N/A;   ESOPHAGOSCOPY N/A 12/17/2020   Procedure: ESOPHAGOSCOPY;  Surgeon: Izora Gala, MD;  Location: [place];  Service: ENT;  Laterality: N/A;   IR GASTROSTOMY TUBE MOD SED  01/28/2021   IR GASTROSTOMY TUBE REMOVAL  08/29/2021   IR IMAGING GUIDED PORT INSERTION  01/28/2021   IR REMOVAL TUN ACCESS W/ PORT W/O FL MOD SED  08/29/2021   PROSTATE SURGERY  2009   radiation treatment     RADICAL NECK DISSECTION Left 12/17/2020   Procedure: LEFT NECK DISSECTION;  Surgeon: Izora Gala, MD;  Location: [place];  Service: ENT;  Laterality: Left;   RESECTION OF MEDIASTINAL MASS N/A 02/26/2015   Procedure: RESECTION OF MEDIASTINAL MASS;  Surgeon: Ivin Poot, MD;  Location: [place];  Service: Thoracic;  Laterality: N/A;   STERNOTOMY N/A 02/26/2015   Procedure: STERNOTOMY;  Surgeon: Ivin Poot, MD;  Location: [place];  Service: Thoracic;  Laterality: N/A;   UPPER GASTROINTESTINAL ENDOSCOPY  Current Medications: Current Outpatient Medications on File Prior to Visit  Medication Sig   apixaban (ELIQUIS) 5 MG TABS tablet Place 1 tablet (5 mg total) into feeding tube 2 (two) times daily.   diltiazem (CARDIZEM CD) 120 MG 24 hr capsule Take 1 capsule (120 mg total) by mouth in the morning and at bedtime.   latanoprost (XALATAN) 0.005 % ophthalmic solution Place 1 drop into both eyes at bedtime.   Multiple Vitamin (MULTIVITAMIN) tablet Take 1 tablet by mouth at bedtime.   Nutritional Supplements (FEEDING SUPPLEMENT, OSMOLITE 1.5 CAL,) LIQD Osmolite 1.5 - Give 7 cartons split over four feedings/day via PEG. Flush tube with 90 ml water before and after each feeding. Drink by mouth or give  via tube additional 2 cups fluids/day. This provides 2485 kcal, 104.3 g protein, 2467 ml total water. Meets 100% estimated needs.   timolol (TIMOPTIC) 0.5 % ophthalmic solution Place 1 drop into both eyes daily.   Vitamin D, Cholecalciferol, 50 MCG (2000 UT) CAPS Take 2,000 Units by mouth at bedtime.   No current facility-administered medications on file prior to visit.     Allergies:   Patient has no known allergies.   Social History   Tobacco Use   Smoking status: Never    Passive exposure: Past   Smokeless tobacco: Never  Vaping Use   Vaping Use: Never used  Substance Use Topics   Alcohol use: No   Drug use: No    Family History: family history includes COPD in his brother, father, and mother; Cancer in his brother; Colon polyps in an other family member; Diabetes in his mother; Heart disease in his brother and mother; Liver cancer in his brother; Pneumonia in his mother. There is no history of Esophageal cancer, Rectal cancer, or Stomach cancer.  ROS:   Please see the history of present illness. (+) Occasional slight dizziness  All other systems are reviewed and negative.     EKGs/Labs/Other Studies Reviewed:    The following studies were reviewed today:  CT Chest 06/14/2021: FINDINGS: Cardiovascular: Ill-defined soft tissue stranding in the lower left cervical region, incompletely imaged (refer to contemporaneously obtained CT the neck for full description). Heart size is normal. There is no significant pericardial fluid, thickening or pericardial calcification. There is aortic atherosclerosis, as well as atherosclerosis of the great vessels of the mediastinum and the coronary arteries, including calcified atherosclerotic plaque in the left anterior descending coronary artery. Right internal jugular single-lumen porta cath with tip terminating in the right atrium.   Mediastinum/Nodes: No pathologically enlarged mediastinal or hilar lymph nodes. Esophagus is  unremarkable in appearance. No axillary lymphadenopathy.   Lungs/Pleura: There are some new areas of ground-glass attenuation and ill-defined areas of pleuroparenchymal nodularity in the apices of the lungs bilaterally, presumably evolving postradiation changes. No other suspicious appearing pulmonary nodules or masses are noted. No acute consolidative airspace disease. No pleural effusions.   Upper Abdomen: Aortic atherosclerosis. Status post cholecystectomy. Percutaneous gastrostomy tube with retention balloon in the distal body of the stomach.   Musculoskeletal: Median sternotomy wires. There are no aggressive appearing lytic or blastic lesions noted in the visualized portions of the skeleton.   IMPRESSION: 1. No definitive findings to suggest metastatic disease in the thorax. 2. Evolving postradiation changes in the lung apices bilaterally. 3. Aortic atherosclerosis, in addition to left anterior descending coronary artery disease. Please note that although the presence of coronary artery calcium documents the presence of coronary artery disease, the severity of this disease and  any potential stenosis cannot be assessed on this non-gated CT examination. Assessment for potential risk factor modification, dietary therapy or pharmacologic therapy may be warranted, if clinically indicated. 4. Additional incidental findings, as above.   Aortic Atherosclerosis (ICD10-I70.0).  Echo 03/07/2021:  1. Left ventricular ejection fraction, by estimation, is 55 to 60%. The  left ventricle has normal function. The left ventricle has no regional  wall motion abnormalities. Left ventricular diastolic function could not  be evaluated.   2. Right ventricular systolic function is normal. The right ventricular  size is normal. There is moderately elevated pulmonary artery systolic  pressure.   3. The mitral valve is normal in structure. Mild mitral valve  regurgitation. No evidence of mitral  stenosis.   4. The aortic valve is tricuspid. Aortic valve regurgitation is not  visualized. No aortic stenosis is present.   5. The inferior vena cava is dilated in size with <50% respiratory  variability, suggesting right atrial pressure of 15 mmHg.    Cardiac CT 09/05/2014: ADDENDUM REPORT: 09/06/2014 09:27   ADDENDUM: There is a round peripherally calcified lesion the anterior mediastinum measuring 2.7 by 2.7 cm. This lesion demonstrates no post-contrast enhancement and is external to the pericardium indicated this is not and aneurysmal lesion. Differential remains a calcified lymph node versus calcified thymoma or teratoma. No aggressive features.  ADDENDUM REPORT: 09/05/2014 17:49 FINDINGS: A 120 kV prospective scan was triggered in the descending thoracic aorta at 111 HU's. Axial non-contrast 69m slices were carried out through the heart. The data set was analyzed on a dedicated work station and scored using the [place] rotation speed was 270 msecs and collimation was .9 mm. No beta blockade and 0.4 mg of sl NTG was given. The 3D data set was reconstructed in 5% intervals of the 67-82 % of the R-R cycle. Diastolic phases were analyzed on a dedicated work station using MPR, MIP and VRT modes. The patient received 80 cc of contrast.   Aorta: Normal caliber. Mild diffuse calcifications in the aortic arch. No dissection.   Aortic Valve:  Trileaflet.  No calcifications.   Coronary Arteries:  Normal origin.  Right dominance.   RCA is a large vessel that gives rise to acute marginal branch, PDA and PLA. There is no plaque in RCA territory.   LM is free of plaque.   LAD is a large caliber vessel that wraps around the apex and gives rise to two diagonal branches. There are two mild calcified plaques in the mid LAD with maximal stenosis 25-50%.   LCX artery is medium caliber vessel that gives rise to two obtuse marginal branches and has minimal plaque.    Other findings:   There is circular rim calcified mass measuring 27 x 28 x 34 mm present in the anterior mediastinum. The mass is located anteriorly to the ascending aorta, adjacent to the pericardium, but no signs of invasion into pericardium or any cardiac structures. [facility].   Persistent foramen ovale present.  No ASD.   Normal pulmonary vein drainage (two on the right, two on the left).   IMPRESSION: 1. Coronary calcium score of 75. This was 535percentile for age and sex matched control.   2. Non-obstructive calcified plaque in mid LAD. Risk factor modification is recommended.   3. There is circular rim calcified mass measuring 27 x 28 x 34 mm present in the anterior mediastinum. The mass is located anteriorly to the ascending aorta, adjacent  to the pericardium, but no signs of invasion into pericardium or any cardiac structures. For further characterization please see [facility] Radiology report.   4. No mass was identified within the pericardium. No coronary aneurysm was seen.   5. Present PFO.   EKG:  EKG is personally reviewed.   02/03/22: atrial fibrillation at 70 bpm, RBBB 10/11/2021:  atrial fibrillation at 81 bpm, RBBB 07/01/2021: EKG was not ordered. 03/28/21: atrial fibrillation at 80 bpm  Recent Labs: 03/05/2021: Magnesium 1.9 03/07/2021: TSH 0.500 07/03/2021: ALT 21; BUN 19; Creatinine 0.99; Potassium 4.5; Sodium 140 01/31/2022: Hemoglobin 13.9; Platelets 250   Recent Lipid Panel No results found for: "CHOL", "TRIG", "HDL", "CHOLHDL", "VLDL", "LDLCALC", "LDLDIRECT"  Physical Exam:    VS:  BP 94/72   Pulse 70   Ht _0  (1.702 m)   Wt 162 lb (73.5 kg)   BMI 25.37 kg/m     Wt Readings from Last 3 Encounters:  02/03/22 162 lb (73.5 kg)  01/24/22 161 lb 1.6 oz (73.1 kg)  10/11/21 166 lb 9.6 oz (75.6 kg)    GEN: Well nourished, well developed in no acute distress HEENT: Normal, moist  mucous membranes NECK: No JVD CARDIAC: Irregularly irregular, normal S1 and S2, no rubs or gallops. No murmur. VASCULAR: Radial and DP pulses 2+ bilaterally. No carotid bruits RESPIRATORY:  Clear to auscultation without rales, wheezing or rhonchi  ABDOMEN: Soft, non-tender, non-distended MUSCULOSKELETAL:  Ambulates independently SKIN: Warm and dry, no edema NEUROLOGIC:  Alert and oriented x 3. No focal neuro deficits noted. PSYCHIATRIC:  Normal affect    ASSESSMENT:    1. Persistent atrial fibrillation ([place])   2. Secondary hypercoagulable state ([place])   3. Long term current use of anticoagulant   4. Head and neck cancer ([place])     PLAN:    History of SCC of the head and neck, with chemo/radiation -now tolerating oral intake, PEG tube removed  Atrial fibrillation, initially with RVR -in rate controlled atrial fibrillation today -CHA2DS2/VAS Stroke Risk Points=2  -brief red tinged urine, self resolved after one episode, CBC stable -continue DOAC -tolerating diltiazem 120 mg ER twice a day, metoprolol tartrate 25 mg BID -discussed rhythm control strategies. Discussed amiodarone for several weeks, then cardioversion. Also discussed afib ablation. Gave information on both. He will consider and let me know his preference. Otherwise we will continue with rate control  Cardiac risk counseling and prevention recommendations: -recommend heart healthy/Mediterranean diet, with whole grains, fruits, vegetable, fish, lean meats, nuts, and olive oil. Limit salt. -recommend moderate walking, 3-5 times/week for 30-50 minutes each session. Aim for at least 150 minutes.week. Goal should be pace of 3 miles/hours, or walking 1.5 miles in 30 minutes -recommend avoidance of tobacco products. Avoid excess alcohol.  Plan for follow up: 3 months.  Buford Dresser, MD, PhD, [facility]    Medication Adjustments/Labs and Tests Ordered: Current medicines are reviewed at length  with the patient today.  Concerns regarding medicines are outlined above.   Orders Placed This Encounter  Procedures   EKG 12-Lead   Meds ordered this encounter  Medications   metoprolol tartrate (LOPRESSOR) 25 MG tablet    Sig: Take 1 tablet (25 mg total) by mouth 2 (two) times daily.    Dispense:  60 tablet    Refill:  11   Patient Instructions  Medication Instructions:  None  *If you need a refill on your cardiac medications before your next appointment, please call your pharmacy*  Lab Work: None  Testing/Procedures: None   Follow-Up: At [facility], you and your health needs are our priority.  As part of our continuing mission to provide you with exceptional heart care, we have created designated Provider Care Teams.  These Care Teams include your primary Cardiologist (physician) and Advanced Practice Providers (APPs -  Physician Assistants and Nurse Practitioners) who all work together to provide you with the care you need, when you need it.  We recommend signing up for the patient portal called "MyChart".  Sign up information is provided on this After Visit Summary.  MyChart is used to connect with patients for Virtual Visits (Telemedicine).  Patients are able to view lab/test results, encounter notes, upcoming appointments, etc.  Non-urgent messages can be sent to your provider as well.   To learn more about what you can do with MyChart, go to [email or web address].    Your next appointment:   3 month(s)  The format for your next appointment:   In Person  Provider:   Buford Dresser, MD    Other Instructions None            I,Breanna Adamick,acting as a scribe for Buford Dresser, MD.,have documented all relevant documentation on the behalf of Buford Dresser, MD,as directed by  Buford Dresser, MD while in the presence of Buford Dresser, MD.   I, Buford Dresser, MD, have reviewed all documentation for  this visit. The documentation on 02/03/22 for the exam, diagnosis, procedures, and orders are all accurate and complete.   Signed, Buford Dresser, MD PhD 02/03/2022 7:19 PM    [place]

## 2022-02-05 NOTE — Telephone Encounter (Signed)
Pt completed appointment on 10/30

## 2022-02-06 ENCOUNTER — Other Ambulatory Visit (HOSPITAL_BASED_OUTPATIENT_CLINIC_OR_DEPARTMENT_OTHER): Payer: Self-pay | Admitting: Cardiology

## 2022-02-06 DIAGNOSIS — I4819 Other persistent atrial fibrillation: Secondary | ICD-10-CM

## 2022-02-06 NOTE — Telephone Encounter (Signed)
Eliquis '5mg'$  refill request received. Patient is 71 years old, weight-73.5kg, Crea-0.99 on 07/03/2021, Diagnosis-Afib, and last seen by Dr. Harrell Gave on 02/03/2022. Dose is appropriate based on dosing criteria. Will send in refill to requested pharmacy.

## 2022-02-10 IMAGING — CT NM PET TUM IMG INITIAL (PI) SKULL BASE T - THIGH
9 series · 25 of 25 positions shown · non-contrast
Comparison: CT neck 12/13/2020

CLINICAL DATA: Initial treatment strategy for head neck carcinoma.
History of prostate carcinoma. LEFT neck cervical lymph nodes with
metastatic squamous cell carcinoma.

EXAM:
NUCLEAR MEDICINE PET SKULL BASE TO THIGH
TECHNIQUE: 11.1 mCi F-18 FDG was injected intravenously. Full-ring PET imaging
was performed from the skull base to thigh after the radiotracer. CT
data was obtained and used for attenuation correction and anatomic
localization.
Fasting blood glucose: 106 mg/dl

[Series 3: ct wb 5.0 b30f · axial · 5.0mm · 0.98mm/px · z∈[-331,+653]mm · 3 of 329 slices shown]
[im 1/329]
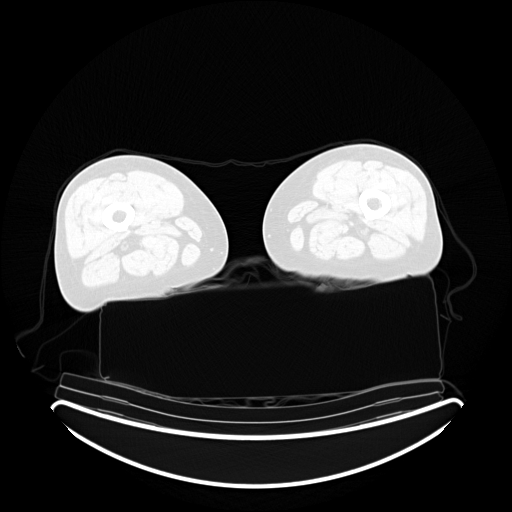
[im 165/329]
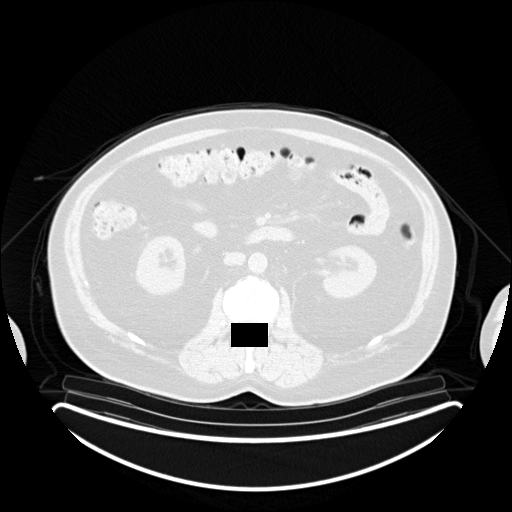
[im 329/329  brain]
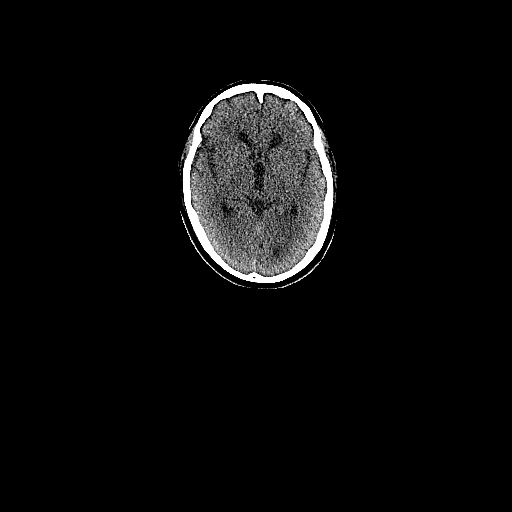

[Series 5: pet wb uncorrected (nac) · axial · 5.0mm · 4.07mm/px · z∈[-331,+653]mm · 4 of 329 slices shown]
[im 1/329]
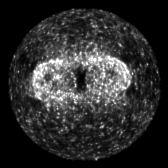
[im 110/329]
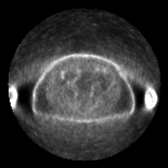
[im 219/329]
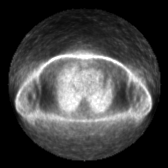
[im 329/329]
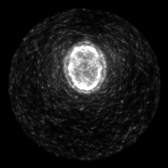

[Series 6: pet wb (ac) · axial · 5.0mm · 3.70mm/px · z∈[-331,+653]mm · 4 of 329 slices shown]
[im 1/329]
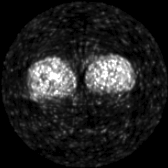
[im 110/329]
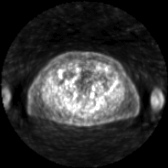
[im 219/329]
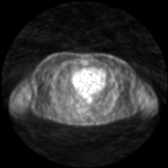
[im 329/329]
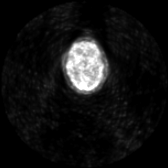

[Series 603: fused axial · 4 of 327 slices shown]
[im 1/327]
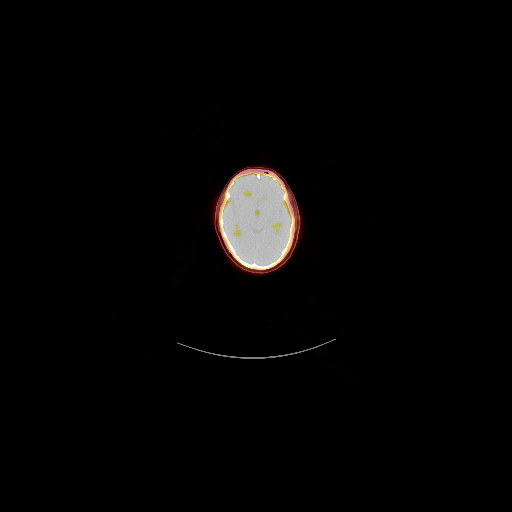
[im 109/327]
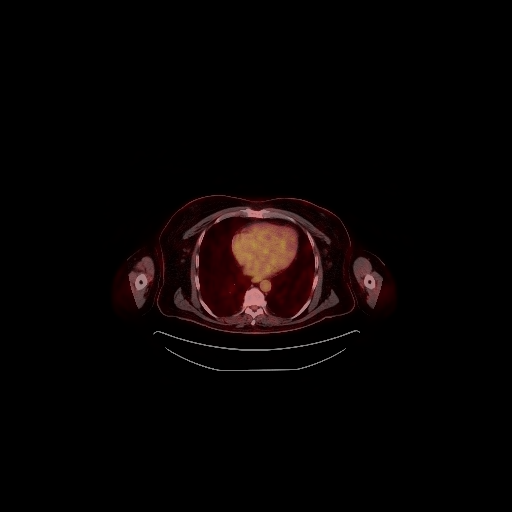
[im 218/327]
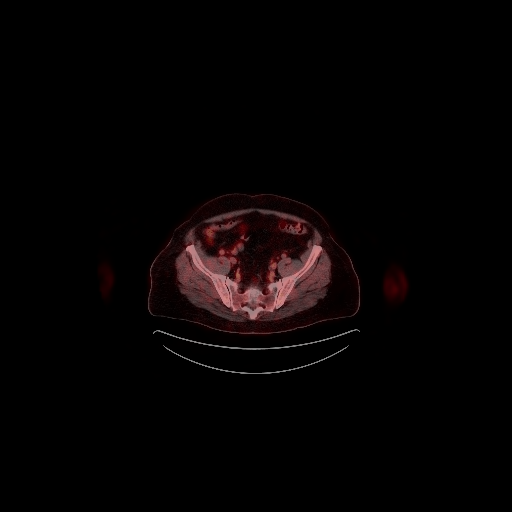
[im 327/327]
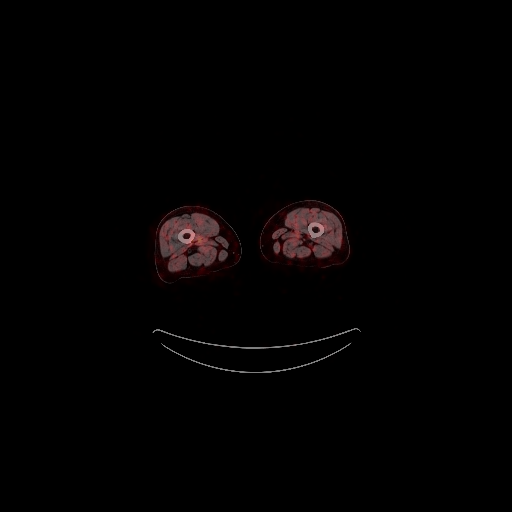

[Series 604: fused coronal · 1 of 88 slices shown]
[im 1/88]
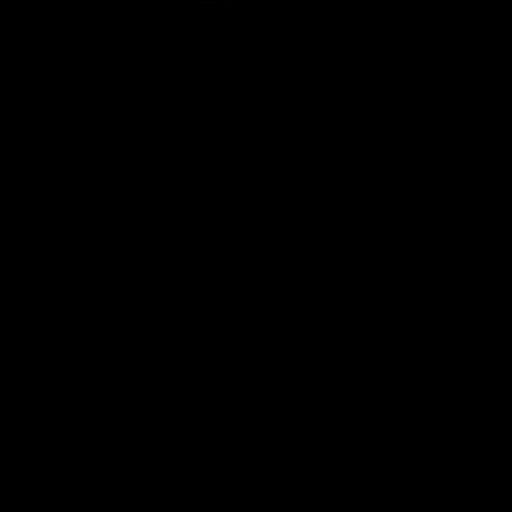

[Series 605: fused sagittal · 2 of 180 slices shown]
[im 1/180]
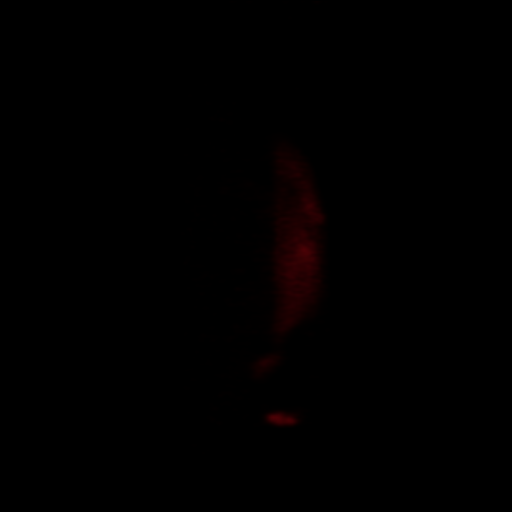
[im 180/180]
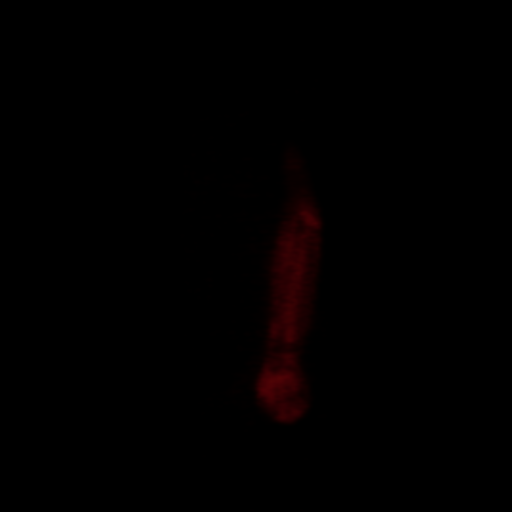

[Series 606: pet axial · 4 of 328 slices shown]
[im 1/328]
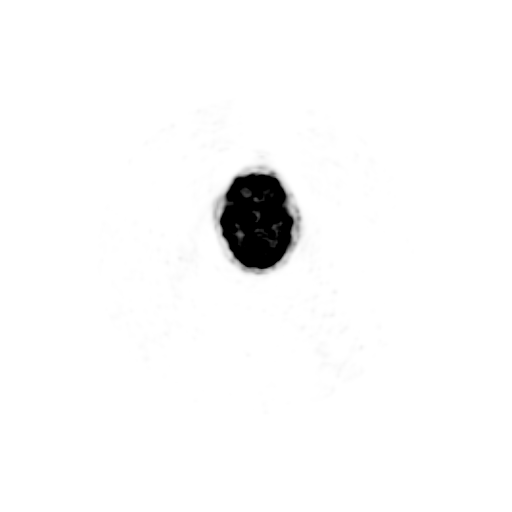
[im 110/328]
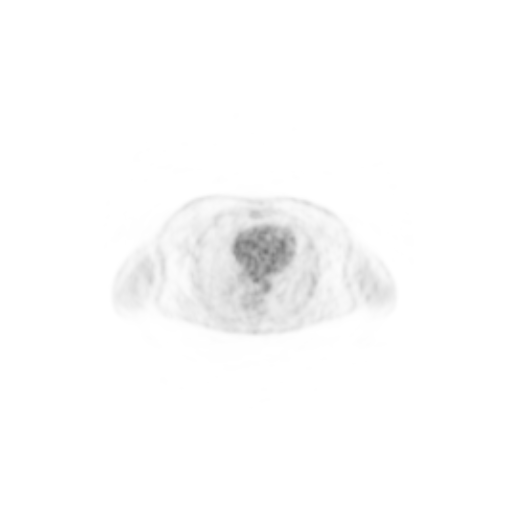
[im 219/328]
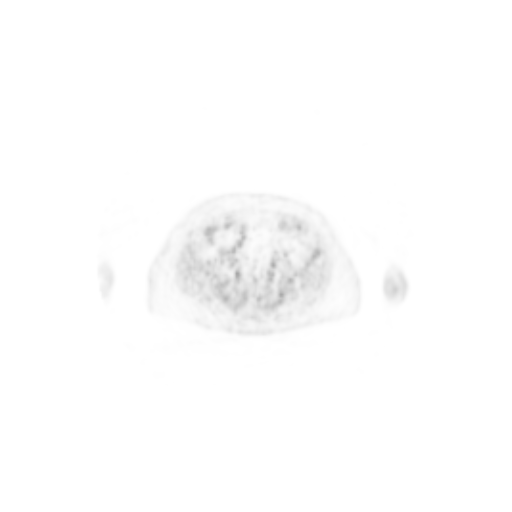
[im 328/328]
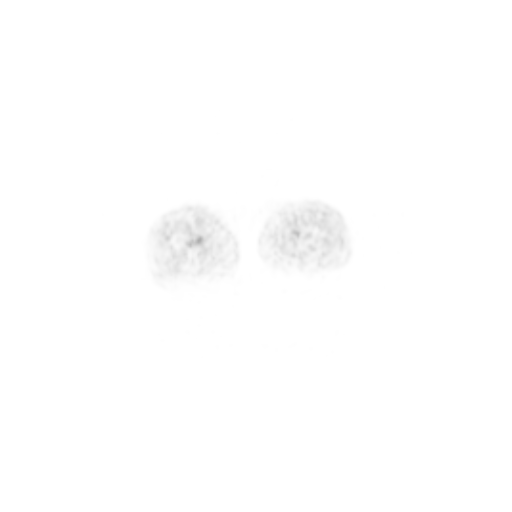

[Series 607: pet coronal · 1 of 126 slices shown]
[im 1/126]
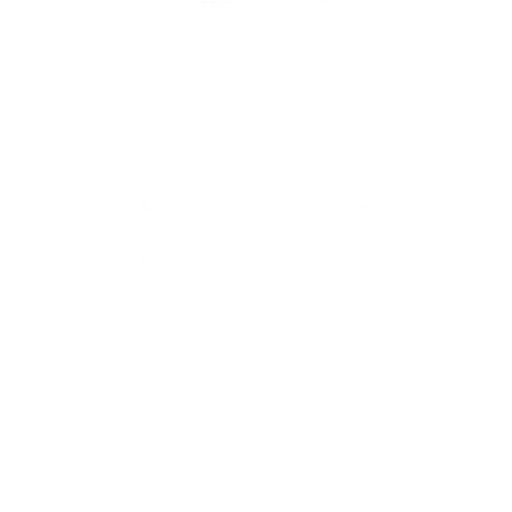

[Series 608: pet sagittal · 2 of 204 slices shown]
[im 1/204]
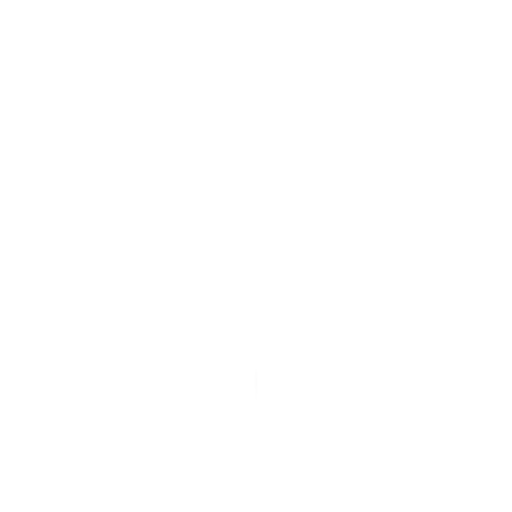
[im 204/204]
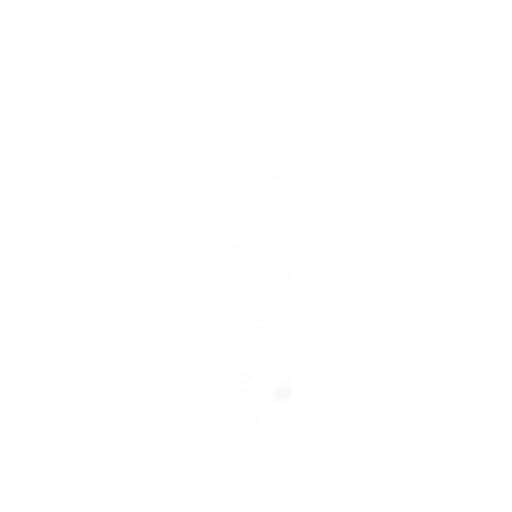

[25 of 25 positions shown; findings below may reference images not displayed]

FINDINGS: Mediastinal blood pool activity: SUV max

Liver activity: SUV max NA

NECK: Interval excision of the LEFT cervical lymph nodes. There is
moderate metabolic activity in the surgical bed in the LEFT level 2
neck and LEFT level 1 neck with SUV max equal 5.8. No discrete
measurable node remains.

No hypermetabolic lymph nodes in the RIGHT neck.

No hypermetabolic activity in the oropharynx or hypopharynx.

Incidental CT findings: none

CHEST: No hypermetabolic mediastinal or hilar nodes. No suspicious
pulmonary nodules on the CT scan.

Incidental CT findings: Post sternotomy

ABDOMEN/PELVIS: No abnormal hypermetabolic activity within the
liver, pancreas, adrenal glands, or spleen. No hypermetabolic lymph
nodes in the abdomen or pelvis.

Incidental CT findings: none

SKELETON: No focal hypermetabolic activity to suggest skeletal
metastasis.

Incidental CT findings: none
IMPRESSION: 1. Metabolic activity in the LEFT neck without measurable adenopathy
is considered benign postsurgical inflammation.
2. No hypermetabolic or enlarged RIGHT cervical lymph nodes.
3. No abnormal or asymmetric hypermetabolic activity in the
hypopharynx or oropharynx.
4. No evidence distant metastatic disease

## 2022-02-17 DIAGNOSIS — H0102B Squamous blepharitis left eye, upper and lower eyelids: Secondary | ICD-10-CM | POA: Diagnosis not present

## 2022-02-17 DIAGNOSIS — Z961 Presence of intraocular lens: Secondary | ICD-10-CM | POA: Diagnosis not present

## 2022-02-17 DIAGNOSIS — H0102A Squamous blepharitis right eye, upper and lower eyelids: Secondary | ICD-10-CM | POA: Diagnosis not present

## 2022-02-17 DIAGNOSIS — H04123 Dry eye syndrome of bilateral lacrimal glands: Secondary | ICD-10-CM | POA: Diagnosis not present

## 2022-02-17 DIAGNOSIS — H40013 Open angle with borderline findings, low risk, bilateral: Secondary | ICD-10-CM | POA: Diagnosis not present

## 2022-02-17 DIAGNOSIS — H35373 Puckering of macula, bilateral: Secondary | ICD-10-CM | POA: Diagnosis not present

## 2022-02-24 ENCOUNTER — Telehealth: Payer: Self-pay | Admitting: Oncology

## 2022-02-24 ENCOUNTER — Encounter (INDEPENDENT_AMBULATORY_CARE_PROVIDER_SITE_OTHER): Payer: PPO | Admitting: Ophthalmology

## 2022-02-24 DIAGNOSIS — H35372 Puckering of macula, left eye: Secondary | ICD-10-CM | POA: Diagnosis not present

## 2022-02-24 DIAGNOSIS — H43813 Vitreous degeneration, bilateral: Secondary | ICD-10-CM | POA: Diagnosis not present

## 2022-02-24 NOTE — Telephone Encounter (Signed)
Called patient regarding providers departure and new provider appointment in April. Patient is notified.

## 2022-03-03 DIAGNOSIS — C61 Malignant neoplasm of prostate: Secondary | ICD-10-CM | POA: Diagnosis not present

## 2022-03-10 DIAGNOSIS — I10 Essential (primary) hypertension: Secondary | ICD-10-CM | POA: Diagnosis not present

## 2022-03-10 DIAGNOSIS — R310 Gross hematuria: Secondary | ICD-10-CM | POA: Diagnosis not present

## 2022-03-10 DIAGNOSIS — E785 Hyperlipidemia, unspecified: Secondary | ICD-10-CM | POA: Diagnosis not present

## 2022-03-10 DIAGNOSIS — N3942 Incontinence without sensory awareness: Secondary | ICD-10-CM | POA: Diagnosis not present

## 2022-03-10 DIAGNOSIS — N3020 Other chronic cystitis without hematuria: Secondary | ICD-10-CM | POA: Diagnosis not present

## 2022-03-10 DIAGNOSIS — C61 Malignant neoplasm of prostate: Secondary | ICD-10-CM | POA: Diagnosis not present

## 2022-03-12 DIAGNOSIS — I10 Essential (primary) hypertension: Secondary | ICD-10-CM | POA: Diagnosis not present

## 2022-03-12 DIAGNOSIS — Z7689 Persons encountering health services in other specified circumstances: Secondary | ICD-10-CM | POA: Diagnosis not present

## 2022-03-12 DIAGNOSIS — R82998 Other abnormal findings in urine: Secondary | ICD-10-CM | POA: Diagnosis not present

## 2022-03-13 DIAGNOSIS — C61 Malignant neoplasm of prostate: Secondary | ICD-10-CM | POA: Diagnosis not present

## 2022-03-13 DIAGNOSIS — I700 Atherosclerosis of aorta: Secondary | ICD-10-CM | POA: Diagnosis not present

## 2022-03-13 DIAGNOSIS — E785 Hyperlipidemia, unspecified: Secondary | ICD-10-CM | POA: Diagnosis not present

## 2022-03-13 DIAGNOSIS — Z0000 Encounter for general adult medical examination without abnormal findings: Secondary | ICD-10-CM | POA: Diagnosis not present

## 2022-03-13 DIAGNOSIS — Z1339 Encounter for screening examination for other mental health and behavioral disorders: Secondary | ICD-10-CM | POA: Diagnosis not present

## 2022-03-13 DIAGNOSIS — E43 Unspecified severe protein-calorie malnutrition: Secondary | ICD-10-CM | POA: Diagnosis not present

## 2022-03-13 DIAGNOSIS — Z1331 Encounter for screening for depression: Secondary | ICD-10-CM | POA: Diagnosis not present

## 2022-03-13 DIAGNOSIS — I4819 Other persistent atrial fibrillation: Secondary | ICD-10-CM | POA: Diagnosis not present

## 2022-03-13 DIAGNOSIS — D6869 Other thrombophilia: Secondary | ICD-10-CM | POA: Diagnosis not present

## 2022-03-13 DIAGNOSIS — Z7901 Long term (current) use of anticoagulants: Secondary | ICD-10-CM | POA: Diagnosis not present

## 2022-03-19 DIAGNOSIS — H35372 Puckering of macula, left eye: Secondary | ICD-10-CM | POA: Diagnosis not present

## 2022-03-27 DIAGNOSIS — H33192 Other retinoschisis and retinal cysts, left eye: Secondary | ICD-10-CM | POA: Diagnosis not present

## 2022-03-27 DIAGNOSIS — H35372 Puckering of macula, left eye: Secondary | ICD-10-CM | POA: Diagnosis not present

## 2022-04-14 DIAGNOSIS — C61 Malignant neoplasm of prostate: Secondary | ICD-10-CM | POA: Diagnosis not present

## 2022-04-14 DIAGNOSIS — R310 Gross hematuria: Secondary | ICD-10-CM | POA: Diagnosis not present

## 2022-04-21 DIAGNOSIS — C61 Malignant neoplasm of prostate: Secondary | ICD-10-CM | POA: Diagnosis not present

## 2022-04-21 DIAGNOSIS — N3041 Irradiation cystitis with hematuria: Secondary | ICD-10-CM | POA: Diagnosis not present

## 2022-04-21 DIAGNOSIS — N3942 Incontinence without sensory awareness: Secondary | ICD-10-CM | POA: Diagnosis not present

## 2022-04-21 DIAGNOSIS — R9721 Rising PSA following treatment for malignant neoplasm of prostate: Secondary | ICD-10-CM | POA: Diagnosis not present

## 2022-04-23 ENCOUNTER — Other Ambulatory Visit (HOSPITAL_COMMUNITY): Payer: Self-pay | Admitting: Urology

## 2022-04-23 DIAGNOSIS — R972 Elevated prostate specific antigen [PSA]: Secondary | ICD-10-CM

## 2022-05-08 ENCOUNTER — Encounter (HOSPITAL_BASED_OUTPATIENT_CLINIC_OR_DEPARTMENT_OTHER): Payer: Self-pay | Admitting: Cardiology

## 2022-05-08 ENCOUNTER — Ambulatory Visit (INDEPENDENT_AMBULATORY_CARE_PROVIDER_SITE_OTHER): Payer: PPO | Admitting: Cardiology

## 2022-05-08 VITALS — BP 110/78 | HR 79 | Ht 67.0 in | Wt 168.1 lb

## 2022-05-08 DIAGNOSIS — I4819 Other persistent atrial fibrillation: Secondary | ICD-10-CM | POA: Diagnosis not present

## 2022-05-08 DIAGNOSIS — C760 Malignant neoplasm of head, face and neck: Secondary | ICD-10-CM | POA: Diagnosis not present

## 2022-05-08 DIAGNOSIS — Z7901 Long term (current) use of anticoagulants: Secondary | ICD-10-CM

## 2022-05-08 DIAGNOSIS — D6869 Other thrombophilia: Secondary | ICD-10-CM

## 2022-05-08 NOTE — Progress Notes (Signed)
Cardiology Office Note:    Date:  05/08/2022   ID:  KALED AI, DOB 1951-03-12, MRN [id number]  PCP:  [name], MD  Cardiologist:  Buford Dresser, MD  Referring MD: [name], MD   CC: follow up  History of Present Illness:    LATISHA VANLENTEN is a 72 y.o. 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 I met him during his prior hospitalization (discharged 03/12/21).  At his last visit he complained of occasional mild dizziness. Home blood pressures averaged upper 90s to low [id number] systolic. Typically he was asymptomatic with low blood pressures. We discussed rhythm control strategies. Discussed amiodarone for several weeks, then cardioversion. Also discussed afib ablation. Gave information on both. He would consider these options and continue with rate control in the interim.    Today, he is accompanied by his wife. He reports feeling very fatigued and drowsy frequently, especially after exercising. While walking or being active, he is able to tell that he goes into Afib when he suddenly feels fatigued or is no longer able to walk to the mailbox.  Reportedly his PSA is gradually increasing, followed by urology.  He denies any chest pain, or peripheral edema. No lightheadedness, headaches, syncope, orthopnea, or PND.  Past Medical History:  Diagnosis Date   Atrial fibrillation ([place])    Cataract    removed bilateraly   Coronary artery calcification seen on CT scan    Cough    Essential hypertension    Glaucoma    Head and neck cancer (HCC)    Hypertension    PFO (patent foramen ovale)    PFO noted on coronary CT 09/05/14   Prostate cancer ([place]) 2009    Past Surgical History:  Procedure Laterality Date   CATARACT EXTRACTION     bilateral and a wrinkle on retina on the left    CHOLECYSTECTOMY     COLONOSCOPY     DIRECT  LARYNGOSCOPY N/A 12/17/2020   Procedure: DIRECT LARYNGOSCOPY WITH BIOPSIES;  Surgeon: Izora Gala, MD;  Location: [facility];  Service: ENT;  Laterality: N/A;   ESOPHAGOSCOPY N/A 12/17/2020   Procedure: ESOPHAGOSCOPY;  Surgeon: Izora Gala, MD;  Location: [facility];  Service: ENT;  Laterality: N/A;   IR GASTROSTOMY TUBE MOD SED  01/28/2021   IR GASTROSTOMY TUBE REMOVAL  08/29/2021   IR IMAGING GUIDED PORT INSERTION  01/28/2021   IR REMOVAL TUN ACCESS W/ PORT W/O FL MOD SED  08/29/2021   PROSTATE SURGERY  2009   radiation treatment     RADICAL NECK DISSECTION Left 12/17/2020   Procedure: LEFT NECK DISSECTION;  Surgeon: Izora Gala, MD;  Location: [facility];  Service: ENT;  Laterality: Left;   RESECTION OF MEDIASTINAL MASS N/A 02/26/2015   Procedure: RESECTION OF MEDIASTINAL MASS;  Surgeon: Ivin Poot, MD;  Location: [facility];  Service: Thoracic;  Laterality: N/A;   STERNOTOMY N/A 02/26/2015   Procedure: STERNOTOMY;  Surgeon: Ivin Poot, MD;  Location: [facility] OR;  Service: Thoracic;  Laterality: N/A;   UPPER GASTROINTESTINAL ENDOSCOPY      Current Medications: Current Outpatient Medications on File Prior to Visit  Medication Sig   apixaban (ELIQUIS) 5 MG TABS tablet PLACE 1 TABLET INTO THE FEEDING TUBE 2 TIMES DAILY   diltiazem (CARDIZEM CD) 120 MG 24 hr capsule Take 1 capsule (120 mg total) by mouth in  the morning and at bedtime.   latanoprost (XALATAN) 0.005 % ophthalmic solution Place 1 drop into both eyes at bedtime.   metoprolol tartrate (LOPRESSOR) 25 MG tablet Take 1 tablet (25 mg total) by mouth 2 (two) times daily.   Multiple Vitamin (MULTIVITAMIN) tablet Take 1 tablet by mouth at bedtime.   omeprazole (PRILOSEC) 40 MG capsule Take 40 mg by mouth daily.   timolol (TIMOPTIC) 0.5 % ophthalmic solution Place 1 drop into both eyes daily.   Vitamin D, Cholecalciferol, 50 MCG (2000 UT) CAPS Take 2,000 Units by mouth at bedtime.   No current facility-administered medications on file prior to  visit.     Allergies:   Patient has no known allergies.   Social History   Tobacco Use   Smoking status: Never    Passive exposure: Past   Smokeless tobacco: Never  Vaping Use   Vaping Use: Never used  Substance Use Topics   Alcohol use: No   Drug use: No    Family History: family history includes COPD in his brother, father, and mother; Cancer in his brother; Colon polyps in an other family member; Diabetes in his mother; Heart disease in his brother and mother; Liver cancer in his brother; Pneumonia in his mother. There is no history of Esophageal cancer, Rectal cancer, or Stomach cancer.  ROS:   Please see the history of present illness. (+) Fatigue (+) Daytime somnolence All other systems are reviewed and negative.     EKGs/Labs/Other Studies Reviewed:    The following studies were reviewed today:  CT Chest 06/14/2021: FINDINGS: Cardiovascular: Ill-defined soft tissue stranding in the lower left cervical region, incompletely imaged (refer to contemporaneously obtained CT the neck for full description). Heart size is normal. There is no significant pericardial fluid, thickening or pericardial calcification. There is aortic atherosclerosis, as well as atherosclerosis of the great vessels of the mediastinum and the coronary arteries, including calcified atherosclerotic plaque in the left anterior descending coronary artery. Right internal jugular single-lumen porta cath with tip terminating in the right atrium.   Mediastinum/Nodes: No pathologically enlarged mediastinal or hilar lymph nodes. Esophagus is unremarkable in appearance. No axillary lymphadenopathy.   Lungs/Pleura: There are some new areas of ground-glass attenuation and ill-defined areas of pleuroparenchymal nodularity in the apices of the lungs bilaterally, presumably evolving postradiation changes. No other suspicious appearing pulmonary nodules or masses are noted. No acute consolidative airspace  disease. No pleural effusions.   Upper Abdomen: Aortic atherosclerosis. Status post cholecystectomy. Percutaneous gastrostomy tube with retention balloon in the distal body of the stomach.   Musculoskeletal: Median sternotomy wires. There are no aggressive appearing lytic or blastic lesions noted in the visualized portions of the skeleton.   IMPRESSION: 1. No definitive findings to suggest metastatic disease in the thorax. 2. Evolving postradiation changes in the lung apices bilaterally. 3. Aortic atherosclerosis, in addition to left anterior descending coronary artery disease. Please note that although the presence of coronary artery calcium documents the presence of coronary artery disease, the severity of this disease and any potential stenosis cannot be assessed on this non-gated CT examination. Assessment for potential risk factor modification, dietary therapy or pharmacologic therapy may be warranted, if clinically indicated. 4. Additional incidental findings, as above.   Aortic Atherosclerosis (ICD10-I70.0).  Echo 03/07/2021:  1. Left ventricular ejection fraction, by estimation, is 55 to 60%. The  left ventricle has normal function. The left ventricle has no regional  wall motion abnormalities. Left ventricular diastolic function could not  be  evaluated.   2. Right ventricular systolic function is normal. The right ventricular  size is normal. There is moderately elevated pulmonary artery systolic  pressure.   3. The mitral valve is normal in structure. Mild mitral valve  regurgitation. No evidence of mitral stenosis.   4. The aortic valve is tricuspid. Aortic valve regurgitation is not  visualized. No aortic stenosis is present.   5. The inferior vena cava is dilated in size with <50% respiratory  variability, suggesting right atrial pressure of 15 mmHg.    Cardiac CT 09/05/2014: ADDENDUM REPORT: 09/06/2014 09:27   ADDENDUM: There is a round peripherally calcified  lesion the anterior mediastinum measuring 2.7 by 2.7 cm. This lesion demonstrates no post-contrast enhancement and is external to the pericardium indicated this is not and aneurysmal lesion. Differential remains a calcified lymph node versus calcified thymoma or teratoma. No aggressive features.  ADDENDUM REPORT: 09/05/2014 17:49 FINDINGS: A 120 kV prospective scan was triggered in the descending thoracic aorta at 111 HU's. Axial non-contrast 61m slices were carried out through the heart. The data set was analyzed on a dedicated work station and scored using the [place] rotation speed was 270 msecs and collimation was .9 mm. No beta blockade and 0.4 mg of sl NTG was given. The 3D data set was reconstructed in 5% intervals of the 67-82 % of the R-R cycle. Diastolic phases were analyzed on a dedicated work station using MPR, MIP and VRT modes. The patient received 80 cc of contrast.   Aorta: Normal caliber. Mild diffuse calcifications in the aortic arch. No dissection.   Aortic Valve:  Trileaflet.  No calcifications.   Coronary Arteries:  Normal origin.  Right dominance.   RCA is a large vessel that gives rise to acute marginal branch, PDA and PLA. There is no plaque in RCA territory.   LM is free of plaque.   LAD is a large caliber vessel that wraps around the apex and gives rise to two diagonal branches. There are two mild calcified plaques in the mid LAD with maximal stenosis 25-50%.   LCX artery is medium caliber vessel that gives rise to two obtuse marginal branches and has minimal plaque.   Other findings:   There is circular rim calcified mass measuring 27 x 28 x 34 mm present in the anterior mediastinum. The mass is located anteriorly to the ascending aorta, adjacent to the pericardium, but no signs of invasion into pericardium or any cardiac structures. For further characterization please see [facility].   Persistent foramen ovale  present.  No ASD.   Normal pulmonary vein drainage (two on the right, two on the left).   IMPRESSION: 1. Coronary calcium score of 75. This was 564percentile for age and sex matched control.   2. Non-obstructive calcified plaque in mid LAD. Risk factor modification is recommended.   3. There is circular rim calcified mass measuring 27 x 28 x 34 mm present in the anterior mediastinum. The mass is located anteriorly to the ascending aorta, adjacent to the pericardium, but no signs of invasion into pericardium or any cardiac structures. For further characterization please see [facility] report.   4. No mass was identified within the pericardium. No coronary aneurysm was seen.   5. Present PFO.   EKG:  EKG is personally reviewed.   05/08/2022:  EKG was not ordered. 02/03/22: atrial fibrillation at 70 bpm, RBBB 10/11/2021:  atrial fibrillation at 81 bpm, RBBB 07/01/2021: EKG was not ordered. 03/28/21:  atrial fibrillation at 80 bpm  Recent Labs: 07/03/2021: ALT 21; BUN 19; Creatinine 0.99; Potassium 4.5; Sodium 140 01/31/2022: Hemoglobin 13.9; Platelets 250   Recent Lipid Panel No results found for: "CHOL", "TRIG", "HDL", "CHOLHDL", "VLDL", "LDLCALC", "LDLDIRECT"  Physical Exam:    VS:  BP 110/78 (BP Location: Left Arm, Patient Position: Sitting, Cuff Size: Normal)   Pulse 79   Ht '5\' 7"'$  (1.702 m)   Wt 168 lb 1.6 oz (76.2 kg)   SpO2 98%   BMI 26.33 kg/m     Wt Readings from Last 3 Encounters:  05/08/22 168 lb 1.6 oz (76.2 kg)  02/03/22 162 lb (73.5 kg)  01/24/22 161 lb 1.6 oz (73.1 kg)    GEN: Well nourished, well developed in no acute distress HEENT: Normal, moist mucous membranes NECK: No JVD CARDIAC: Irregularly irregular, normal S1 and S2, no rubs or gallops. No murmur. VASCULAR: Radial and DP pulses 2+ bilaterally. No carotid bruits RESPIRATORY:  Clear to auscultation without rales, wheezing or rhonchi  ABDOMEN: Soft, non-tender,  non-distended MUSCULOSKELETAL:  Ambulates independently SKIN: Warm and dry, no edema NEUROLOGIC:  Alert and oriented x 3. No focal neuro deficits noted. PSYCHIATRIC:  Normal affect    ASSESSMENT:    1. Persistent atrial fibrillation ([place])   2. Secondary hypercoagulable state ([place])   3. Long term current use of anticoagulant   4. Head and neck cancer ([place])     PLAN:    History of SCC of the head and neck, with chemo/radiation -now tolerating oral intake, PEG tube removed  Atrial fibrillation, initially with RVR -in rate controlled atrial fibrillation today -CHA2DS2/VAS Stroke Risk Points=2  -continue DOAC -tolerating diltiazem 120 mg ER twice a day, metoprolol tartrate 25 mg BID -discussed rhythm control strategies. Discussed amiodarone for several weeks, then cardioversion. Also discussed afib ablation. Gave information on both. He will consider and let me know his preference. Otherwise we will continue with rate control  Cardiac risk counseling and prevention recommendations: -recommend heart healthy/Mediterranean diet, with whole grains, fruits, vegetable, fish, lean meats, nuts, and olive oil. Limit salt. -recommend moderate walking, 3-5 times/week for 30-50 minutes each session. Aim for at least 150 minutes.week. Goal should be pace of 3 miles/hours, or walking 1.5 miles in 30 minutes -recommend avoidance of tobacco products. Avoid excess alcohol.  Plan for follow up: 3 months or sooner as needed.  Buford Dresser, MD, PhD, [facility]    Medication Adjustments/Labs and Tests Ordered: Current medicines are reviewed at length with the patient today.  Concerns regarding medicines are outlined above.   No orders of the defined types were placed in this encounter.  No orders of the defined types were placed in this encounter.  Patient Instructions  Medication Instructions:  Your physician recommends that you continue on your current medications  as directed. Please refer to the Current Medication list given to you today.  *If you need a refill on your cardiac medications before your next appointment, please call your pharmacy*  Lab Work: NONE  Testing/Procedures: NONE  Follow-Up: At [facility], you and your health needs are our priority.  As part of our continuing mission to provide you with exceptional heart care, we have created designated Provider Care Teams.  These Care Teams include your primary Cardiologist (physician) and Advanced Practice Providers (APPs -  Physician Assistants and Nurse Practitioners) who all work together to provide you with the care you need, when you need it.  We recommend  signing up for the patient portal called "MyChart".  Sign up information is provided on this After Visit Summary.  MyChart is used to connect with patients for Virtual Visits (Telemedicine).  Patients are able to view lab/test results, encounter notes, upcoming appointments, etc.  Non-urgent messages can be sent to your provider as well.   To learn more about what you can do with MyChart, go to [email or web address].    Your next appointment:   3 month(s)  The format for your next appointment:   In Person  Provider:   Buford Dresser, MD       [facility],acting as a scribe for Buford Dresser, MD.,have documented all relevant documentation on the behalf of Buford Dresser, MD,as directed by  Buford Dresser, MD while in the presence of Buford Dresser, MD.   I, Buford Dresser, MD, have reviewed all documentation for this visit. The documentation on 05/08/22 for the exam, diagnosis, procedures, and orders are all accurate and complete.   Signed, Buford Dresser, MD PhD 05/08/2022     [place]

## 2022-05-08 NOTE — Patient Instructions (Signed)
Medication Instructions:  Your physician recommends that you continue on your current medications as directed. Please refer to the Current Medication list given to you today.  *If you need a refill on your cardiac medications before your next appointment, please call your pharmacy*  Lab Work: NONE  Testing/Procedures: NONE  Follow-Up: At [facility], you and your health needs are our priority.  As part of our continuing mission to provide you with exceptional heart care, we have created designated Provider Care Teams.  These Care Teams include your primary Cardiologist (physician) and Advanced Practice Providers (APPs -  Physician Assistants and Nurse Practitioners) who all work together to provide you with the care you need, when you need it.  We recommend signing up for the patient portal called "MyChart".  Sign up information is provided on this After Visit Summary.  MyChart is used to connect with patients for Virtual Visits (Telemedicine).  Patients are able to view lab/test results, encounter notes, upcoming appointments, etc.  Non-urgent messages can be sent to your provider as well.   To learn more about what you can do with MyChart, go to [email or web address].    Your next appointment:   3 month(s)  The format for your next appointment:   In Person  Provider:   Bridgette Christopher, MD     

## 2022-05-09 ENCOUNTER — Encounter (HOSPITAL_COMMUNITY)
Admission: RE | Admit: 2022-05-09 | Discharge: 2022-05-09 | Disposition: A | Discharge status: Home / Self Care | Payer: PPO | Source: Ambulatory Visit | Attending: Urology | Admitting: Urology

## 2022-05-09 DIAGNOSIS — R972 Elevated prostate specific antigen [PSA]: Secondary | ICD-10-CM | POA: Insufficient documentation

## 2022-05-09 DIAGNOSIS — Z8589 Personal history of malignant neoplasm of other organs and systems: Secondary | ICD-10-CM | POA: Diagnosis not present

## 2022-05-09 MED ORDER — PIFLIFOLASTAT F 18 (PYLARIFY) INJECTION
9.0000 | Freq: Once | INTRAVENOUS | Status: AC
  Administered 2022-05-09: 9.94 via INTRAVENOUS

## 2022-05-29 DIAGNOSIS — L821 Other seborrheic keratosis: Secondary | ICD-10-CM | POA: Diagnosis not present

## 2022-05-29 DIAGNOSIS — Z85828 Personal history of other malignant neoplasm of skin: Secondary | ICD-10-CM | POA: Diagnosis not present

## 2022-05-29 DIAGNOSIS — D0439 Carcinoma in situ of skin of other parts of face: Secondary | ICD-10-CM | POA: Diagnosis not present

## 2022-05-29 DIAGNOSIS — D225 Melanocytic nevi of trunk: Secondary | ICD-10-CM | POA: Diagnosis not present

## 2022-05-29 DIAGNOSIS — D2262 Melanocytic nevi of left upper limb, including shoulder: Secondary | ICD-10-CM | POA: Diagnosis not present

## 2022-05-29 DIAGNOSIS — D2261 Melanocytic nevi of right upper limb, including shoulder: Secondary | ICD-10-CM | POA: Diagnosis not present

## 2022-05-29 DIAGNOSIS — D1801 Hemangioma of skin and subcutaneous tissue: Secondary | ICD-10-CM | POA: Diagnosis not present

## 2022-05-29 DIAGNOSIS — L570 Actinic keratosis: Secondary | ICD-10-CM | POA: Diagnosis not present

## 2022-06-10 NOTE — Progress Notes (Signed)
Nicholas Daniels presents for follow up after completing radiation to oropharynx. Patient completed treatment on 03/12/21   Pain issues, if any: Denies pain, reports numbness to left side of face.  Using a feeding tube?: No, feeding tube removed.  Weight changes, if any:  Wt Readings from Last 3 Encounters:  06/24/22 167 lb 3.2 oz (75.8 kg)  05/08/22 168 lb 1.6 oz (76.2 kg)  02/03/22 162 lb (73.5 kg)    Swallowing issues, if any: Reports difficulty with swallowing at times. Patient also reports getting choked.  Smoking or chewing tobacco? No Using fluoride trays daily? NA  Last ENT visit was on: Last visit was over 1 year ago Other notable issues, if any: Patient reports having a new skin cancer to left side of nose. Patient currently  on imiquimod 5% cream to nose Monday-Friday x 28 days.   BP 126/82 (BP Location: Right Arm, Patient Position: Sitting, Cuff Size: Normal)   Pulse 68   Temp 97.6 F (36.4 C)   Resp 20   Ht 5\' 7"  (1.702 m)   Wt 167 lb 3.2 oz (75.8 kg)   SpO2 100%   BMI 26.19 kg/m

## 2022-06-11 ENCOUNTER — Encounter (HOSPITAL_BASED_OUTPATIENT_CLINIC_OR_DEPARTMENT_OTHER): Payer: Self-pay | Admitting: Cardiology

## 2022-06-23 DIAGNOSIS — H33192 Other retinoschisis and retinal cysts, left eye: Secondary | ICD-10-CM | POA: Diagnosis not present

## 2022-06-23 DIAGNOSIS — H43813 Vitreous degeneration, bilateral: Secondary | ICD-10-CM | POA: Diagnosis not present

## 2022-06-23 DIAGNOSIS — H35373 Puckering of macula, bilateral: Secondary | ICD-10-CM | POA: Diagnosis not present

## 2022-06-24 ENCOUNTER — Encounter: Payer: Self-pay | Admitting: Radiation Oncology

## 2022-06-24 ENCOUNTER — Ambulatory Visit
Admission: RE | Admit: 2022-06-24 | Discharge: 2022-06-24 | Disposition: A | Discharge status: Home / Self Care | Payer: PPO | Source: Ambulatory Visit | Attending: Radiation Oncology | Admitting: Radiation Oncology

## 2022-06-24 VITALS — BP 126/82 | HR 68 | Temp 97.6°F | Resp 20 | Ht 67.0 in | Wt 167.2 lb

## 2022-06-24 DIAGNOSIS — C760 Malignant neoplasm of head, face and neck: Secondary | ICD-10-CM | POA: Diagnosis not present

## 2022-06-24 DIAGNOSIS — Z7901 Long term (current) use of anticoagulants: Secondary | ICD-10-CM | POA: Insufficient documentation

## 2022-06-24 DIAGNOSIS — Z923 Personal history of irradiation: Secondary | ICD-10-CM | POA: Insufficient documentation

## 2022-06-24 DIAGNOSIS — Z79899 Other long term (current) drug therapy: Secondary | ICD-10-CM | POA: Insufficient documentation

## 2022-06-24 DIAGNOSIS — C770 Secondary and unspecified malignant neoplasm of lymph nodes of head, face and neck: Secondary | ICD-10-CM | POA: Insufficient documentation

## 2022-06-24 DIAGNOSIS — C801 Malignant (primary) neoplasm, unspecified: Secondary | ICD-10-CM | POA: Insufficient documentation

## 2022-06-24 HISTORY — DX: Unspecified malignant neoplasm of skin, unspecified: C44.90

## 2022-06-24 MED ORDER — OXYMETAZOLINE HCL 0.05 % NA SOLN
1.0000 | Freq: Once | NASAL | Status: AC
  Administered 2022-06-24: 1 via NASAL
  Filled 2022-06-24: qty 30

## 2022-06-24 NOTE — Progress Notes (Signed)
Radiation Oncology         [phone] ________________________________  Name: Nicholas Daniels MRN: [id number]  Date: 06/24/2022  DOB: Aug 01, 1950  Follow-Up Visit Note  CC: [name], MD  Izora Gala, MD  Diagnosis and Prior Radiotherapy:       ICD-10-CM   1. 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 Head and neck cancer ([place])  C76.0       Cancer Staging  Head and neck cancer ([place]) Staging form: Pharynx - HPV-Mediated Oropharynx, AJCC 8th Edition - Clinical stage from 01/01/2021: Stage I (cT0, cN1, cM0, p16+) - Unsigned Stage prefix: Initial diagnosis   Radiation Treatment Dates: 01/29/2021 through 03/12/2021 Site Technique Total Dose (Gy) Dose per Fx (Gy) Completed Fx Beam Energies  Oropharynx: HN_bilat IMRT 60/60 2 30/30 6X   CHIEF COMPLAINT:  Here for follow-up and surveillance of throat cancer  Narrative:   Mr. Winstanley presents today for follow-up after completing radiation to his oropharynx on 03/12/2021, and to review CT scan results from 06/14/2021  Pain issues, if any: Reports tightness to behind left ear, but otherwise denies any issues or concerns Using a feeding tube?: Occasionally supplements with 1-2 cartons of osmolite during the day if he doesn't think he's consumed enough calories. Weight changes, if any:  Wt Readings from Last 3 Encounters:  06/18/21 184 lb (83.5 kg)  05/10/21 185 lb (83.9 kg)  05/03/21 188 lb 12.8 oz (85.6 kg)   Swallowing issues, if any: Patient denies. Saw Glendell Docker Schinke-SLP on 05/21/2021: "At this time pt swallowing is deemed WNL/WFL with dys III/regular diet and thin liquids. SLP reviewed pt's individualized HEP for dysphagia and pt completed each exercise on their own with independence. Pt is not completing HEP to recommended frequency There are no overt s/s aspiration reported by pt at this time." Smoking or chewing tobacco?  None Using fluoride trays daily? Had F/U with Dr. Sandi Mariscal on 05/10/2021. Denies any new dental concerns or mouth sores Last ENT visit was on: Not since diagnosis Other notable issues, if any: Had F/U with his medical oncologist Dr. Alen Blew on 05/03/2021. Continues to deal with dry mouth and altered since of taste (the latter is his biggest hurdle). Mild lymphedema to the right side of his neck (scheduled for PT session in April). Reports dizzy spells have improved over the past month.                       ALLERGIES:  has No Known Allergies.  Meds: Current Outpatient Medications  Medication Sig Dispense Refill   apixaban (ELIQUIS) 5 MG TABS tablet PLACE 1 TABLET INTO THE FEEDING TUBE 2 TIMES DAILY 60 tablet 5   diltiazem (CARDIZEM CD) 120 MG 24 hr capsule Take 1 capsule (120 mg total) by mouth in the morning and at bedtime. 180 capsule 3   imiquimod (ALDARA) 5 % cream Apply 1 Application topically daily.  Apply to nose Monday - Friday x 28 days     latanoprost (XALATAN) 0.005 % ophthalmic solution Place 1 drop into both eyes at bedtime.     metoprolol tartrate (LOPRESSOR) 25 MG tablet Take 1 tablet (25 mg total) by mouth 2 (two) times daily. 60 tablet 11   omeprazole (PRILOSEC) 40 MG capsule Take 40 mg by mouth daily.     timolol (TIMOPTIC) 0.5 % ophthalmic solution Place 1  drop into both eyes daily.     Multiple Vitamin (MULTIVITAMIN) tablet Take 1 tablet by mouth at bedtime. (Patient not taking: Reported on 06/24/2022)     Vitamin D, Cholecalciferol, 50 MCG (2000 UT) CAPS Take 2,000 Units by mouth at bedtime. (Patient not taking: Reported on 06/24/2022)     No current facility-administered medications for this encounter.    Physical Findings: The patient is in no acute distress. Patient is alert and oriented. Wt Readings from Last 3 Encounters:  06/24/22 167 lb 3.2 oz (75.8 kg)  05/08/22 168 lb 1.6 oz (76.2 kg)  02/03/22 162 lb (73.5 kg)    height is 5\' 7"  (1.702 m) and weight is  167 lb 3.2 oz (75.8 kg). His temperature is 97.6 F (36.4 C). His blood pressure is 126/82 and his pulse is 68. His respiration is 20 and oxygen saturation is 100%. .  General: Alert and oriented, in no acute distress HEENT: Head is normocephalic. Extraocular movements are intact. Oropharynx is clear Neck: Neck is notable for no masses. Normal thyroid cartilage palpated at the left upper cervical region.  Skin: Skin in treatment fields has healed well Psychiatric: Judgment and insight are intact. Affect is appropriate. Heart RRR Chest CTAB Abdomen: normoactive bowel sounds, non tender to palpation  PROCEDURE NOTE: After obtaining consent and spraying nasal cavity with topical oxymetazoline, the flexible endoscope was coated with lidocaine gel and introduced and passed through the nasal cavity.  The nasopharynx, oropharynx, hypopharynx, and larynx  were then examined. No lesions appreciated in the mucosal axis. The true cords were symmetrically mobile.    Lab Findings: Lab Results  Component Value Date   WBC 4.3 01/31/2022   HGB 13.9 01/31/2022   HCT 40.2 01/31/2022   MCV 90 01/31/2022   PLT 250 01/31/2022    Lab Results  Component Value Date   TSH 0.500 03/07/2021    Radiographic Findings: No results found.   Impression/Plan:    1) Head and Neck Cancer Status:  Patient is doing well today. NED on physical exam today.  I also reviewed the patient's recent PET scan, which was PSMA, but did show good anatomic definition with CT imaging to confirm that he is without evidence of disease  2) Nutritional Status: stable Wt Readings from Last 3 Encounters:  06/24/22 167 lb 3.2 oz (75.8 kg)  05/08/22 168 lb 1.6 oz (76.2 kg)  02/03/22 162 lb (73.5 kg)    3) Swallowing: Patient does not use PEG.   4) Dental: Encouraged to continue regular followup with dentistry, and dental hygiene including fluoride rinses.     5) Thyroid function:  check annually in med/onc. Patient has TSH  scheduled for 07/25/22.  Lab Results  Component Value Date   TSH 0.500 03/07/2021   6) Other: Edema of anterior neck: pt states he knows PT exercises and has a device at home.   7) Follow-up in 4 mo w/ ENT - Anderson Malta will referral back to Dr Constance Holster. Patient will begin following w/ Dr. Chryl Heck as Dr Alen Blew retired. Follow-up with radiation oncology PRN. The patient was encouraged to call with any issues or questions. It was a pleasure taking part in this patient's care.  On date of service, in total, I spent 30 minutes on this encounter. Patient was seen in person. _____________________________________   Leona Singleton, PA    Eppie Gibson, MD

## 2022-06-25 NOTE — Progress Notes (Signed)
Oncology Nurse Navigator Documentation   Per patient's 06/23/21 post-treatment follow-up with Dr. Isidore Moos, sent fax to [facility] ENT Scheduling with request Mr. Darrow be contacted and scheduled for routine post-RT follow-up in 4 months with Dr. Constance Holster.  Notification of successful fax transmission received.   Harlow Asa RN, BSN, OCN Head & Neck Oncology Nurse [place] at [facility] Phone # [phone]  Fax # [phone]

## 2022-06-26 ENCOUNTER — Encounter: Payer: Self-pay | Admitting: Hematology and Oncology

## 2022-07-03 DIAGNOSIS — Z85828 Personal history of other malignant neoplasm of skin: Secondary | ICD-10-CM | POA: Diagnosis not present

## 2022-07-03 DIAGNOSIS — L309 Dermatitis, unspecified: Secondary | ICD-10-CM | POA: Diagnosis not present

## 2022-07-18 DIAGNOSIS — C61 Malignant neoplasm of prostate: Secondary | ICD-10-CM | POA: Diagnosis not present

## 2022-07-25 ENCOUNTER — Inpatient Hospital Stay: Payer: PPO

## 2022-07-25 DIAGNOSIS — K219 Gastro-esophageal reflux disease without esophagitis: Secondary | ICD-10-CM | POA: Diagnosis not present

## 2022-07-25 DIAGNOSIS — I4819 Other persistent atrial fibrillation: Secondary | ICD-10-CM | POA: Diagnosis not present

## 2022-07-25 DIAGNOSIS — R051 Acute cough: Secondary | ICD-10-CM | POA: Diagnosis not present

## 2022-07-25 DIAGNOSIS — J209 Acute bronchitis, unspecified: Secondary | ICD-10-CM | POA: Diagnosis not present

## 2022-07-29 DIAGNOSIS — N3942 Incontinence without sensory awareness: Secondary | ICD-10-CM | POA: Diagnosis not present

## 2022-07-29 DIAGNOSIS — C61 Malignant neoplasm of prostate: Secondary | ICD-10-CM | POA: Diagnosis not present

## 2022-08-01 ENCOUNTER — Encounter: Payer: Self-pay | Admitting: Hematology and Oncology

## 2022-08-01 ENCOUNTER — Ambulatory Visit: Payer: PPO | Admitting: Oncology

## 2022-08-01 ENCOUNTER — Inpatient Hospital Stay: Payer: PPO | Attending: Hematology and Oncology | Admitting: Hematology and Oncology

## 2022-08-01 VITALS — BP 121/71 | HR 75 | Temp 97.8°F | Resp 16 | Ht 67.0 in | Wt 164.5 lb

## 2022-08-01 DIAGNOSIS — Z923 Personal history of irradiation: Secondary | ICD-10-CM | POA: Insufficient documentation

## 2022-08-01 DIAGNOSIS — Z8546 Personal history of malignant neoplasm of prostate: Secondary | ICD-10-CM | POA: Diagnosis present

## 2022-08-01 DIAGNOSIS — Z79899 Other long term (current) drug therapy: Secondary | ICD-10-CM | POA: Diagnosis not present

## 2022-08-01 DIAGNOSIS — I4891 Unspecified atrial fibrillation: Secondary | ICD-10-CM | POA: Diagnosis not present

## 2022-08-01 DIAGNOSIS — Z9079 Acquired absence of other genital organ(s): Secondary | ICD-10-CM | POA: Insufficient documentation

## 2022-08-01 DIAGNOSIS — C770 Secondary and unspecified malignant neoplasm of lymph nodes of head, face and neck: Secondary | ICD-10-CM | POA: Diagnosis not present

## 2022-08-01 DIAGNOSIS — Z7901 Long term (current) use of anticoagulants: Secondary | ICD-10-CM | POA: Diagnosis not present

## 2022-08-01 NOTE — Progress Notes (Signed)
Hematology and Oncology Follow Up Visit  Nicholas Daniels [id number] Sep 18, 1950 72 y.o. 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     2.   Prostate cancer diagnosed in 2009.  He was found to have T3N0 Gleason score 3+4 = 7 and currently with biochemical relapse.  Prior Therapy:  He underwent prostatectomy and found to have a T3a N0 Gleason score 3+4 = 7 cancer.  He underwent salvage radiation in 2012.   He is status post direct laryngoscopy and left neck dissection completed by Nicholas Daniels on December 17, 2020.  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 Adjuvant radiation therapy with weekly cisplatin started on February 05, 2021.   He completed adjuvant radiation therapy with cisplatin in November 2022.  Current therapy: Active surveillance  Interim History: Nicholas Daniels returns today for repeat evaluation.  Since his las visit here, he has seen Nicholas Daniels from urology and says his PSA is elevated in 6's.  He states Nicholas Daniels is considering putting him back on Xtandi.  He did this in the past and tolerated it well.  He has not seen Nicholas Daniels in over a couple years.  He denies any new complaints except for dry mouth and some drooling of the saliva.  No cough, chest pain or shortness of breath.  He had recent bronchitis and had some residual cough from it.  Rest of the pertinent 10 point ROS reviewed and negative  Medications: Updated on review. Current Outpatient Medications  Medication Sig Dispense Refill   apixaban (ELIQUIS) 5 MG TABS tablet PLACE 1 TABLET INTO THE FEEDING TUBE 2 TIMES DAILY 60 tablet 5   diltiazem (CARDIZEM CD) 120 MG 24 hr capsule Take 1 capsule (120 mg total) by mouth in the morning and at bedtime. 180 capsule 3    imiquimod (ALDARA) 5 % cream Apply 1 Application topically daily.  Apply to nose Monday - Friday x 28 days     latanoprost (XALATAN) 0.005 % ophthalmic solution Place 1 drop into both eyes at bedtime.     metoprolol tartrate (LOPRESSOR) 25 MG tablet Take 1 tablet (25 mg total) by mouth 2 (two) times daily. 60 tablet 11   Multiple Vitamin (MULTIVITAMIN) tablet Take 1 tablet by mouth at bedtime. (Patient not taking: Reported on 06/24/2022)     omeprazole (PRILOSEC) 40 MG capsule Take 40 mg by mouth daily.     timolol (TIMOPTIC) 0.5 % ophthalmic solution Place 1 drop into both eyes daily.     Vitamin D, Cholecalciferol, 50 MCG (2000 UT) CAPS Take 2,000 Units by mouth at bedtime. (Patient not taking: Reported on 06/24/2022)     No current facility-administered medications for this visit.     Allergies: No Known Allergies   Physical Exam:  There were no vitals taken for this visit.    ECOG: 1   General appearance: Alert, awake without any distress. Head: Atraumatic without abnormalities Oropharynx: Without any thrush or ulcers. Eyes: No scleral icterus. Lymph nodes: No lymphadenopathy noted in the cervical, supraclavicular, or axillary nodes Heart: Regular without murmurs.  Rate is controlled.   Lung: Clear to auscultation without any rhonchi, wheezes or dullness to percussion. Abdomin: Soft, nontender without any shifting dullness or ascites. Musculoskeletal: No clubbing or cyanosis. Neurological: No motor or sensory  deficits. Skin: No rashes or lesions. Psychiatric: Mood and affect appeared normal.  Lab Results: Lab Results  Component Value Date   WBC 4.3 01/31/2022   HGB 13.9 01/31/2022   HCT 40.2 01/31/2022   MCV 90 01/31/2022   PLT 250 01/31/2022     Chemistry      Component Value Date/Time   NA 140 07/03/2021 1325   K 4.5 07/03/2021 1325   CL 106 07/03/2021 1325   CO2 30 07/03/2021 1325   BUN 19 07/03/2021 1325   CREATININE 0.99 07/03/2021 1325   CREATININE 1.10  02/13/2015 1545      Component Value Date/Time   CALCIUM 9.5 07/03/2021 1325   ALKPHOS 27 (L) 07/03/2021 1325   AST 17 07/03/2021 1325   ALT 21 07/03/2021 1325   BILITOT 0.9 07/03/2021 1325     IMPRESSION: No signs of PSMA avid disease recurrence following prostatectomy.   Postoperative changes in the LEFT neck with diminished stranding in the subcutaneous fat. Otherwise grossly stable morphologic changes related to post treatment effect.   Mild bladder wall thickening and perivesical stranding, urinary bladder is under distended and findings are nonspecific. Correlate with any signs of cystitis.   Aortic atherosclerosis, calcified coronary artery disease cholecystectomy changes as described.     Assessment and Plan:      72 year old with:  1.  Squamous cell carcinoma of unknown primary presented with cervical adenopathy in September 2022.  He was found to have stage IV at that time and achieved complete response to therapy. Last visit with Dr Nicholas Daniels in 2022.  I sent a message to our nurse navigator so he can reestablish for surveillance.  He is currently on active surveillance without any evidence of relapse based on ROS and physical exam from our clinic.  He can return to clinic in 6 months for follow-up.   2.  Prostate cancer diagnosed in 2012.  He is developing biochemical relapse without any evidence of measurable disease based on pet imaging in February 2024.  He tells me that Nicholas Daniels from urology is planning to start him on Xtandi.  Will try to get a copy of Nicholas Daniels note so we can review his most recent labs as well as plan.  He was encouraged to call us back if he needs any assistance with Xtandi.  3.  Atrial fibrillation: He continues to follow with cardiology regarding this issue.   4. Follow-up: In 6 months for a follow-up visit.   30  minutes were spent on this encounter.  The time was dedicated to reviewing laboratory data, disease status update and  outlining future plan of care review.   Nicholas Moulds, MD 4/26/20241:52 PM

## 2022-08-09 ENCOUNTER — Other Ambulatory Visit (HOSPITAL_BASED_OUTPATIENT_CLINIC_OR_DEPARTMENT_OTHER): Payer: Self-pay | Admitting: Cardiology

## 2022-08-09 DIAGNOSIS — I4819 Other persistent atrial fibrillation: Secondary | ICD-10-CM

## 2022-08-11 ENCOUNTER — Ambulatory Visit (HOSPITAL_BASED_OUTPATIENT_CLINIC_OR_DEPARTMENT_OTHER): Payer: PPO | Admitting: Cardiology

## 2022-08-11 ENCOUNTER — Telehealth (HOSPITAL_BASED_OUTPATIENT_CLINIC_OR_DEPARTMENT_OTHER): Payer: Self-pay | Admitting: Cardiology

## 2022-08-11 ENCOUNTER — Encounter (HOSPITAL_BASED_OUTPATIENT_CLINIC_OR_DEPARTMENT_OTHER): Payer: Self-pay

## 2022-08-11 NOTE — Telephone Encounter (Signed)
Reviewed oncology note, looks like plan is to potentially start Xtandi. This interacts with his Eliquis and diltiazem. Xtandi decreases concentrations of diltiazem so pt may need higher dose of diltiazem. It also decreases concentrations of Eliquis which could then increase his risk of stroke/systemic embolism. All DOACs interact with Xtandi (Eliquis and Xarelto have decreased concentrations, Pradaxa and Savayas have increased concentrations). Would likely need to change to warfarin if he starts on Xtandi which also interacts with Xtandi but his INRs would be monitored at least.

## 2022-08-11 NOTE — Telephone Encounter (Signed)
Please review medlist and advise for any potential interactions

## 2022-08-11 NOTE — Telephone Encounter (Signed)
Rx request sent to pharmacy.  

## 2022-08-11 NOTE — Telephone Encounter (Signed)
Per pharmacy review, will defer to MD input before calling patient back.

## 2022-08-11 NOTE — Telephone Encounter (Signed)
Patient has been advised to start chemotherapy fairly soon.  He would like to speak with someone regarding any possible interactions between the chemotherapy and his Eliquis.  He has an upcoming appointment with Gillian Shields, NP on Friday 08/22/22--he was scheduled today (08/11/22) with Dr. Cristal Deer but appointment had to be cancelled due to provider illness.

## 2022-08-15 NOTE — Telephone Encounter (Signed)
His oncologist reached out to me as well--I'm less concerned about the diltiazem but am concerned about the DOAC. Appreciate the help. Would discuss change to coumadin then based on recommendations. Can discuss further at his upcoming visit with [facility].

## 2022-08-18 NOTE — Telephone Encounter (Signed)
Call attempted, no answer, unable to leave message as no DPR on file.    "His oncologist reached out to me as well--I'm less concerned about the diltiazem but am concerned about the DOAC. Appreciate the help. Would discuss change to coumadin then based on recommendations. Can discuss further at his upcoming visit with [facility]."

## 2022-08-19 DIAGNOSIS — C61 Malignant neoplasm of prostate: Secondary | ICD-10-CM | POA: Diagnosis not present

## 2022-08-19 NOTE — Telephone Encounter (Signed)
2nd Call attempted, no answer, unable to leave message as no DPR on file.      "His oncologist reached out to me as well--I'm less concerned about the diltiazem but am concerned about the DOAC. Appreciate the help. Would discuss change to coumadin then based on recommendations. Can discuss further at his upcoming visit with [facility]."

## 2022-08-21 NOTE — Telephone Encounter (Signed)
3rd call attempt, spoke with patient and wife, they are agreeable to discussing at visit on Friday!      "His oncologist reached out to me as well--I'm less concerned about the diltiazem but am concerned about the DOAC. Appreciate the help. Would discuss change to coumadin then based on recommendations. Can discuss further at his upcoming visit with [facility]."

## 2022-08-22 ENCOUNTER — Ambulatory Visit (HOSPITAL_BASED_OUTPATIENT_CLINIC_OR_DEPARTMENT_OTHER): Payer: PPO | Admitting: Family

## 2022-08-22 ENCOUNTER — Encounter (HOSPITAL_BASED_OUTPATIENT_CLINIC_OR_DEPARTMENT_OTHER): Payer: Self-pay | Admitting: Family

## 2022-08-22 VITALS — BP 102/62 | HR 71 | Ht 67.0 in | Wt 165.0 lb

## 2022-08-22 DIAGNOSIS — I4819 Other persistent atrial fibrillation: Secondary | ICD-10-CM | POA: Diagnosis not present

## 2022-08-22 DIAGNOSIS — D6859 Other primary thrombophilia: Secondary | ICD-10-CM

## 2022-08-22 MED ORDER — WARFARIN SODIUM 5 MG PO TABS: 5.0000 mg | ORAL_TABLET | Freq: Every day | ORAL | 0 refills | Status: DC

## 2022-08-22 MED ORDER — METOPROLOL TARTRATE 25 MG PO TABS: 25.0000 mg | ORAL_TABLET | Freq: Two times a day (BID) | ORAL | 1 refills | Status: DC

## 2022-08-22 NOTE — Progress Notes (Unsigned)
Office Visit    Patient Name: Nicholas Daniels Date of Encounter: 08/22/2022  PCP:  Garlan Fillers, MD   [facility]  Cardiologist:  Jodelle Red, MD  Advanced Practice Provider:  No care team member to display Electrophysiologist:  None      Chief Complaint    Nicholas Daniels is a 72 y.o. male presents today for discussion regarding of OAC   Past Medical History    Past Medical History:  Diagnosis Date   Atrial fibrillation [facility])    Cataract    removed bilateraly   Coronary artery calcification seen on CT scan    Cough    Essential hypertension    Glaucoma    Head and neck cancer (HCC)    Hypertension    PFO (patent foramen ovale)    PFO noted on coronary CT 09/05/14   Prostate cancer (HCC) 2009   Skin cancer    Past Surgical History:  Procedure Laterality Date   CATARACT EXTRACTION     bilateral and a wrinkle on retina on the left    CHOLECYSTECTOMY     COLONOSCOPY     DIRECT LARYNGOSCOPY N/A 12/17/2020   Procedure: DIRECT LARYNGOSCOPY WITH BIOPSIES;  Surgeon: Serena Colonel, MD;  Location: [facility] OR;  Service: ENT;  Laterality: N/A;   ESOPHAGOSCOPY N/A 12/17/2020   Procedure: ESOPHAGOSCOPY;  Surgeon: Serena Colonel, MD;  Location: [facility] OR;  Service: ENT;  Laterality: N/A;   IR GASTROSTOMY TUBE MOD SED  01/28/2021   IR GASTROSTOMY TUBE REMOVAL  08/29/2021   IR IMAGING GUIDED PORT INSERTION  01/28/2021   IR REMOVAL TUN ACCESS W/ PORT W/O FL MOD SED  08/29/2021   PROSTATE SURGERY  2009   radiation treatment     RADICAL NECK DISSECTION Left 12/17/2020   Procedure: LEFT NECK DISSECTION;  Surgeon: Serena Colonel, MD;  Location: [facility] OR;  Service: ENT;  Laterality: Left;   RESECTION OF MEDIASTINAL MASS N/A 02/26/2015   Procedure: RESECTION OF MEDIASTINAL MASS;  Surgeon: Kerin Perna, MD;  Location: [facility] OR;  Service: Thoracic;  Laterality: N/A;   STERNOTOMY N/A 02/26/2015   Procedure: STERNOTOMY;  Surgeon: Kerin Perna, MD;  Location: [facility] OR;   Service: Thoracic;  Laterality: N/A;   UPPER GASTROINTESTINAL ENDOSCOPY      Allergies  No Known Allergies  History of Present Illness    Nicholas Daniels is a 72 y.o. male with a hx of SCC and neck cancer on chemo/radiation, atrial fibrillation, CAD on prior CT scan, hypertension last seen 05/08/2022.  Last seen by Dr. Cristal Deer 05/08/2022 and rate controlled atrial fibrillation.  He opted to proceed with rate control for the time being and was given options such as amiodarone or ablation.  Presents today for follow-up with his wife regarding discussion of anticoagulation and diltiazem.  PSA has been rising and oncology would like to startXtandi which decreased concentrations of both diltiazem and Eliquis.  Unfortunately interacts with all DOAC's and discussed transition to warfarin to the INR could be monitored per prior discussion with pharmacy team.  He notes atrial fibrillation has been overall not been bothersome with no recent palpitations.  He is hopeful for improvement in PSA on Xtandi as he was in clinical trial for about 5 years with last dose 2 years ago and PSA was undetectable while in that clinical trial.  EKGs/Labs/Other Studies Reviewed:   The following studies were reviewed today: Cardiac Studies & Procedures  ECHOCARDIOGRAM  ECHOCARDIOGRAM COMPLETE 03/07/2021  Narrative ECHOCARDIOGRAM REPORT    Patient Name:   Nicholas Daniels [facility] Date of Exam: 03/07/2021 Medical Rec #:  [id number]       Height:       67.0 in Accession #:    [id number]      Weight:       198.2 lb Date of Birth:  Jan 03, 1951      BSA:          2.014 m Patient Age:    70 years        BP:           111/73 mmHg Patient Gender: M               HR:           113 bpm. Exam Location:  Inpatient  Procedure: 2D Echo  Indications:    atrial fibrillation  History:        Patient has no prior history of Echocardiogram examinations. Risk Factors:Hypertension.  Sonographer:    Delcie Roch  RDCS Referring Phys: [id number] Nicholas Daniels  IMPRESSIONS   1. Left ventricular ejection fraction, by estimation, is 55 to 60%. The left ventricle has normal function. The left ventricle has no regional wall motion abnormalities. Left ventricular diastolic function could not be evaluated. 2. Right ventricular systolic function is normal. The right ventricular size is normal. There is moderately elevated pulmonary artery systolic pressure. 3. The mitral valve is normal in structure. Mild mitral valve regurgitation. No evidence of mitral stenosis. 4. The aortic valve is tricuspid. Aortic valve regurgitation is not visualized. No aortic stenosis is present. 5. The inferior vena cava is dilated in size with <50% respiratory variability, suggesting right atrial pressure of 15 mmHg.  Comparison(s): No prior Echocardiogram.  FINDINGS Left Ventricle: Left ventricular ejection fraction, by estimation, is 55 to 60%. The left ventricle has normal function. The left ventricle has no regional wall motion abnormalities. The left ventricular internal cavity size was normal in size. There is no left ventricular hypertrophy. Left ventricular diastolic function could not be evaluated due to atrial fibrillation. Left ventricular diastolic function could not be evaluated.  Right Ventricle: The right ventricular size is normal. Right ventricular systolic function is normal. There is moderately elevated pulmonary artery systolic pressure. The tricuspid regurgitant velocity is 3.16 m/s, and with an assumed right atrial pressure of 15 mmHg, the estimated right ventricular systolic pressure is 54.9 mmHg.  Left Atrium: Left atrial size was normal in size.  Right Atrium: Right atrial size was normal in size.  Pericardium: There is no evidence of pericardial effusion.  Mitral Valve: The mitral valve is normal in structure. Mild mitral valve regurgitation. No evidence of mitral valve stenosis.  Tricuspid Valve: The  tricuspid valve is normal in structure. Tricuspid valve regurgitation is trivial. No evidence of tricuspid stenosis.  Aortic Valve: The aortic valve is tricuspid. Aortic valve regurgitation is not visualized. No aortic stenosis is present.  Pulmonic Valve: The pulmonic valve was normal in structure. Pulmonic valve regurgitation is trivial. No evidence of pulmonic stenosis.  Aorta: The aortic root is normal in size and structure.  Venous: The inferior vena cava is dilated in size with less than 50% respiratory variability, suggesting right atrial pressure of 15 mmHg.  IAS/Shunts: No atrial level shunt detected by color flow Doppler.   LEFT VENTRICLE PLAX 2D LVIDd:         4.10 cm LVIDs:  2.60 cm LV PW:         1.10 cm LV IVS:        0.90 cm LVOT diam:     1.90 cm LVOT Area:     2.84 cm   IVC IVC diam: 2.50 cm  LEFT ATRIUM           Index        RIGHT ATRIUM           Index LA diam:      3.70 cm 1.84 cm/m   RA Area:     14.90 cm LA Vol (A2C): 57.4 ml 28.49 ml/m  RA Volume:   36.10 ml  17.92 ml/m  AORTA Ao Root diam: 3.10 cm Ao Asc diam:  2.90 cm  TRICUSPID VALVE TR Peak grad:   39.9 mmHg TR Vmax:        316.00 cm/s  SHUNTS Systemic Diam: 1.90 cm  Olga Millers MD Electronically signed by Olga Millers MD Signature Date/Time: 03/07/2021/4:45:29 PM    Final             EKG:  EKG is  ordered today.  The ekg ordered today demonstrates rate controlled atrial fibrillation 71 bpm with stable RBBB  Recent Labs: 01/31/2022: Hemoglobin 13.9; Platelets 250  Recent Lipid Panel No results found for: "CHOL", "TRIG", "HDL", "CHOLHDL", "VLDL", "LDLCALC", "LDLDIRECT"  Risk Assessment/Calculations:   CHA2DS2-VASc Score = 3   This indicates a 3.2% annual risk of stroke. The patient's score is based upon: CHF History: 0 HTN History: 1 Diabetes History: 0 Stroke History: 0 Vascular Disease History: 1 Age Score: 1 Gender Score: 0     Home Medications    Current Meds  Medication Sig   apixaban (ELIQUIS) 5 MG TABS tablet PLACE 1 TABLET INTO THE FEEDING TUBE 2 TIMES DAILY   diltiazem (CARDIZEM CD) 120 MG 24 hr capsule Take 1 capsule (120 mg total) by mouth in the morning and at bedtime.   latanoprost (XALATAN) 0.005 % ophthalmic solution Place 1 drop into both eyes at bedtime.   metoprolol tartrate (LOPRESSOR) 25 MG tablet Take 1 tablet (25 mg total) by mouth 2 (two) times daily.   omeprazole (PRILOSEC) 40 MG capsule Take 40 mg by mouth daily.   timolol (TIMOPTIC) 0.5 % ophthalmic solution Place 1 drop into both eyes daily.     Review of Systems      All other systems reviewed and are otherwise negative except as noted above.  Physical Exam    VS:  BP 102/62   Pulse 71   Ht 5\' 7"  (1.702 m)   Wt 165 lb (74.8 kg)   BMI 25.84 kg/m  , BMI Body mass index is 25.84 kg/m.  Wt Readings from Last 3 Encounters:  08/22/22 165 lb (74.8 kg)  08/01/22 164 lb 8 oz (74.6 kg)  06/24/22 167 lb 3.2 oz (75.8 kg)     GEN: Well nourished, well developed, in no acute distress. HEENT: normal. Neck: Supple, no JVD, carotid bruits, or masses. Cardiac: IRIR, no murmurs, rubs, or gallops. No clubbing, cyanosis, edema.  Radials/PT 2+ and equal bilaterally.  Respiratory:  Respirations regular and unlabored, clear to auscultation bilaterally. GI: Soft, nontender, nondistended. MS: No deformity or atrophy. Skin: Warm and dry, no rash. Neuro:  Strength and sensation are intact. Psych: Normal affect.  Assessment & Plan    Persistent atrial fibrillation/hypercoagulable state-plan to start [facility] with hematology.  Xtandi decreases concentrations of Eliquis as well as diltiazem.  Continue present dose diltiazem 120 mg daily and if he notes worsening palpitations he will contact our office and we will consider increasing dose.  Previously discussed with pharmacy team.  Will discontinue Eliquis and start warfarin 5 mg every evening with visit in 5 days to  establish with Coumadin clinic.  He will take Eliquis for the first 3 days of warfarin to allow for therapeutic benefit and thereafter discontinue.         Disposition: Follow up in 3 month(s) with Jodelle Red, MD or APP.  Signed, Alver Sorrow, NP 08/22/2022, 3:02 PM [facility]

## 2022-08-22 NOTE — Patient Instructions (Addendum)
Medication Instructions:  START WARFARIN 5 MG EVERY EVENING STARTING TOMORROW  YOU WILL CONTINUE THE ELIQUIS TWICE A DAY FOR 3 DAYS ONLY THEN STOP. STARTING TUESDAY YOU WILL NOT TAKE THE ELIQUIS ANYMORE   *If you need a refill on your cardiac medications before your next appointment, please call your pharmacy*  Lab Work: NONE  Testing/Procedures: NONE  Follow-Up: At [facility], you and your health needs are our priority.  As part of our continuing mission to provide you with exceptional heart care, we have created designated Provider Care Teams.  These Care Teams include your primary Cardiologist (physician) and Advanced Practice Providers (APPs -  Physician Assistants and Nurse Practitioners) who all work together to provide you with the care you need, when you need it.  We recommend signing up for the patient portal called "MyChart".  Sign up information is provided on this After Visit Summary.  MyChart is used to connect with patients for Virtual Visits (Telemedicine).  Patients are able to view lab/test results, encounter notes, upcoming appointments, etc.  Non-urgent messages can be sent to your provider as well.   To learn more about what you can do with MyChart, go to [email or web address].    Your next appointment:   3 month(s)  Provider:   Jodelle Red, MD or Gillian Shields, NP    [facility] 08/27/2022 2:30 PM WITH THE COUMADIN CLINIC AT THE [facility]ICE  MONITOR YOUR BLOOD PRESSURE AND HEART RATE. IF YOUR BLOOD PRESSURE IS CONSISTENTLY ABOVE 130 OR HEART RATE ABOVE 100 CALL THE OFFICE

## 2022-08-26 DIAGNOSIS — C61 Malignant neoplasm of prostate: Secondary | ICD-10-CM | POA: Diagnosis not present

## 2022-08-27 ENCOUNTER — Ambulatory Visit: Payer: PPO | Attending: Cardiovascular Disease | Admitting: *Deleted

## 2022-08-27 ENCOUNTER — Encounter (HOSPITAL_BASED_OUTPATIENT_CLINIC_OR_DEPARTMENT_OTHER): Payer: Self-pay | Admitting: Family

## 2022-08-27 DIAGNOSIS — I4891 Unspecified atrial fibrillation: Secondary | ICD-10-CM

## 2022-08-27 DIAGNOSIS — Z5181 Encounter for therapeutic drug level monitoring: Secondary | ICD-10-CM | POA: Diagnosis not present

## 2022-08-27 LAB — POCT INR: INR: 3 (ref 2.0–3.0)

## 2022-08-27 NOTE — Patient Instructions (Addendum)
A full discussion of the nature of anticoagulants has been carried out.  A benefit risk analysis has been presented to the patient, so that they understand the justification for choosing anticoagulation at this time. The need for frequent and regular monitoring, precise dosage adjustment and compliance is stressed.  Side effects of potential bleeding are discussed.  The patient should avoid any OTC items containing aspirin or ibuprofen, and should avoid great swings in general diet.  Avoid alcohol consumption.  Call if any signs of abnormal bleeding.   Description   Start taking warfarin 1/2 tablet (2.5mg ) daily except 1 tablet (5mg ) on Sundays. Let us know if you start eating leafy vegetables. Anticoagulation Clinic [phone]

## 2022-09-04 ENCOUNTER — Ambulatory Visit: Payer: PPO | Attending: Cardiology

## 2022-09-04 DIAGNOSIS — Z5181 Encounter for therapeutic drug level monitoring: Secondary | ICD-10-CM | POA: Diagnosis not present

## 2022-09-04 DIAGNOSIS — I4891 Unspecified atrial fibrillation: Secondary | ICD-10-CM

## 2022-09-04 LAB — POCT INR: INR: 2.8 (ref 2.0–3.0)

## 2022-09-04 NOTE — Patient Instructions (Signed)
Continue taking warfarin 1/2 tablet (2.5mg ) daily except 1 tablet (5mg ) on Sundays. Let us know if you start eating leafy vegetables. Anticoagulation Clinic [phone]; INR in 1 week

## 2022-09-11 ENCOUNTER — Ambulatory Visit: Payer: PPO | Attending: Cardiology | Admitting: *Deleted

## 2022-09-11 DIAGNOSIS — I4891 Unspecified atrial fibrillation: Secondary | ICD-10-CM | POA: Diagnosis not present

## 2022-09-11 DIAGNOSIS — Z5181 Encounter for therapeutic drug level monitoring: Secondary | ICD-10-CM

## 2022-09-11 LAB — POCT INR: INR: 2.9 (ref 2.0–3.0)

## 2022-09-11 NOTE — Patient Instructions (Signed)
Description   Continue taking warfarin 1/2 tablet (2.5mg ) daily except 1 tablet (5mg ) on Sundays. Let us know if you start eating leafy vegetables. Anticoagulation Clinic [phone]; INR in 1 week

## 2022-09-14 ENCOUNTER — Other Ambulatory Visit (HOSPITAL_BASED_OUTPATIENT_CLINIC_OR_DEPARTMENT_OTHER): Payer: Self-pay | Admitting: Family

## 2022-09-15 NOTE — Telephone Encounter (Signed)
Please review for refill. Thank you! 

## 2022-09-18 ENCOUNTER — Ambulatory Visit: Payer: PPO | Attending: Cardiology | Admitting: *Deleted

## 2022-09-18 DIAGNOSIS — Z5181 Encounter for therapeutic drug level monitoring: Secondary | ICD-10-CM | POA: Diagnosis not present

## 2022-09-18 DIAGNOSIS — I4891 Unspecified atrial fibrillation: Secondary | ICD-10-CM

## 2022-09-18 LAB — POCT INR: POC INR: 2.4

## 2022-09-18 NOTE — Patient Instructions (Signed)
Description   Continue taking warfarin 1/2 tablet (2.5mg ) daily except 1 tablet (5mg ) on Sundays. Let us know if you start eating leafy vegetables. Anticoagulation Clinic [phone]; INR in 2 week

## 2022-10-01 ENCOUNTER — Ambulatory Visit: Payer: PPO | Attending: Cardiology

## 2022-10-01 DIAGNOSIS — I4891 Unspecified atrial fibrillation: Secondary | ICD-10-CM | POA: Diagnosis not present

## 2022-10-01 DIAGNOSIS — Z5181 Encounter for therapeutic drug level monitoring: Secondary | ICD-10-CM

## 2022-10-01 LAB — POCT INR: INR: 2.9 (ref 2.0–3.0)

## 2022-10-01 NOTE — Patient Instructions (Signed)
Description   Continue taking warfarin 1/2 tablet (2.5mg ) daily except 1 tablet (5mg ) on Sundays.  Stay consistent with greens each week.  Recheck INR in 3 weeks.  Anticoagulation Clinic [phone]

## 2022-10-10 DIAGNOSIS — C61 Malignant neoplasm of prostate: Secondary | ICD-10-CM | POA: Diagnosis not present

## 2022-10-13 DIAGNOSIS — C770 Secondary and unspecified malignant neoplasm of lymph nodes of head, face and neck: Secondary | ICD-10-CM | POA: Diagnosis not present

## 2022-10-13 DIAGNOSIS — Z923 Personal history of irradiation: Secondary | ICD-10-CM | POA: Diagnosis not present

## 2022-10-16 DIAGNOSIS — N62 Hypertrophy of breast: Secondary | ICD-10-CM | POA: Diagnosis not present

## 2022-10-16 DIAGNOSIS — C61 Malignant neoplasm of prostate: Secondary | ICD-10-CM | POA: Diagnosis not present

## 2022-10-16 DIAGNOSIS — N393 Stress incontinence (female) (male): Secondary | ICD-10-CM | POA: Diagnosis not present

## 2022-10-17 ENCOUNTER — Other Ambulatory Visit: Payer: Self-pay | Admitting: Urology

## 2022-10-17 DIAGNOSIS — Z13820 Encounter for screening for osteoporosis: Secondary | ICD-10-CM

## 2022-10-22 ENCOUNTER — Ambulatory Visit: Payer: PPO | Attending: Cardiology

## 2022-10-22 DIAGNOSIS — Z5181 Encounter for therapeutic drug level monitoring: Secondary | ICD-10-CM

## 2022-10-22 DIAGNOSIS — I4891 Unspecified atrial fibrillation: Secondary | ICD-10-CM | POA: Diagnosis not present

## 2022-10-22 LAB — POCT INR: INR: 1.7 — AB (ref 2.0–3.0)

## 2022-10-22 NOTE — Patient Instructions (Signed)
Description   Take 1 tablet today and then resume taking warfarin 1/2 tablet (2.5mg ) daily except 1 tablet (5mg ) on Sundays.  Stay consistent with greens each week.  Recheck INR in 2 weeks.  Anticoagulation Clinic [phone]

## 2022-10-28 DIAGNOSIS — C61 Malignant neoplasm of prostate: Secondary | ICD-10-CM | POA: Diagnosis not present

## 2022-10-28 DIAGNOSIS — I4891 Unspecified atrial fibrillation: Secondary | ICD-10-CM | POA: Diagnosis not present

## 2022-11-05 ENCOUNTER — Ambulatory Visit: Payer: PPO

## 2022-11-06 ENCOUNTER — Ambulatory Visit: Payer: PPO | Attending: Cardiovascular Disease | Admitting: *Deleted

## 2022-11-06 DIAGNOSIS — I4891 Unspecified atrial fibrillation: Secondary | ICD-10-CM | POA: Diagnosis not present

## 2022-11-06 DIAGNOSIS — Z5181 Encounter for therapeutic drug level monitoring: Secondary | ICD-10-CM

## 2022-11-06 LAB — POCT INR: INR: 2.3 (ref 2.0–3.0)

## 2022-11-06 NOTE — Patient Instructions (Signed)
Description   Continue taking warfarin 1/2 tablet (2.5mg ) daily except 1 tablet (5mg ) on Sundays.  Stay consistent with greens each week.  Recheck INR in 3 weeks.  Anticoagulation Clinic [phone]

## 2022-11-10 ENCOUNTER — Other Ambulatory Visit (HOSPITAL_BASED_OUTPATIENT_CLINIC_OR_DEPARTMENT_OTHER): Payer: Self-pay | Admitting: Cardiology

## 2022-11-10 DIAGNOSIS — I4819 Other persistent atrial fibrillation: Secondary | ICD-10-CM

## 2022-11-10 NOTE — Telephone Encounter (Signed)
Rx request sent to pharmacy.  

## 2022-11-21 ENCOUNTER — Ambulatory Visit (INDEPENDENT_AMBULATORY_CARE_PROVIDER_SITE_OTHER): Payer: PPO | Admitting: Cardiology

## 2022-11-21 ENCOUNTER — Encounter (HOSPITAL_BASED_OUTPATIENT_CLINIC_OR_DEPARTMENT_OTHER): Payer: Self-pay | Admitting: Cardiology

## 2022-11-21 VITALS — BP 124/70 | HR 70 | Ht 67.0 in | Wt 163.0 lb

## 2022-11-21 DIAGNOSIS — D6869 Other thrombophilia: Secondary | ICD-10-CM

## 2022-11-21 DIAGNOSIS — I4819 Other persistent atrial fibrillation: Secondary | ICD-10-CM

## 2022-11-21 DIAGNOSIS — Z7901 Long term (current) use of anticoagulants: Secondary | ICD-10-CM | POA: Diagnosis not present

## 2022-11-21 DIAGNOSIS — R9721 Rising PSA following treatment for malignant neoplasm of prostate: Secondary | ICD-10-CM

## 2022-11-21 DIAGNOSIS — C760 Malignant neoplasm of head, face and neck: Secondary | ICD-10-CM | POA: Diagnosis not present

## 2022-11-21 NOTE — Progress Notes (Signed)
Cardiology Office Note:  .    Date:  11/21/2022  ID:  Nicholas Daniels, DOB [birth year], MRN [id number] PCP: Garlan Fillers, MD  [facility] Providers Cardiologist:  Jodelle Red, MD     History of Present Illness: .    Nicholas Daniels is a 72 y.o. 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 I met him during his prior hospitalization (discharged 03/12/21).   In 01/2022, he complained of occasional mild dizziness. Home blood pressures averaged upper 90s to low 100s systolic. Typically he was asymptomatic with low blood pressures. We discussed rhythm control strategies. Discussed amiodarone for several weeks, then cardioversion. Also discussed afib ablation. Gave information on both. He would consider these options and continue with rate control in the interim.     At his visit 05/2022, he was struggling with frequent fatigue and drowsiness especially after exercise. Had intermittent episodes of Afib. Discussed rhythm control strategies. He opted to proceed with his current rate control strategy. Reportedly his PSA was gradually increasing, followed by urology. He followed up with Gillian Shields, NP 08/22/2022 for discussion of anticoagulation. He was transitioned from Eliquis to warfarin in order to start Xtandi due to rising PSA.  Today, he is feeling okay. He continues to have periodic episodes of atrial fibrillation. He experiences palpitations every day. He is unable to walk too far (after 100 yards especially uphill) without becoming short of breath. Usually he is able to keep walking despite his shortness of breath. Prior to the very hot weather, he went walking in the park for exercise. Didn't have to stop to rest.  On coumadin. Evaluating INR checks every 4 weeks. No bleeding issues.  On Xtandi, last PSA undetectable.  Notes that the medication seems to be affecting his sense of taste.  He denies any chest pain, peripheral edema, lightheadedness, headaches, syncope, orthopnea, or PND.  ROS:  Please see the history of present illness. ROS otherwise negative except as noted.  (+) Palpitations (+) Exertional shortness of breath  Studies Reviewed: Marland Kitchen       No new today     Physical Exam:    VS:  BP 124/70   Pulse 70   Ht 5\' 7"  (1.702 m)   Wt 163 lb (73.9 kg)   SpO2 96%   BMI 25.53 kg/m    Wt Readings from Last 3 Encounters:  11/21/22 163 lb (73.9 kg)  08/22/22 165 lb (74.8 kg)  08/01/22 164 lb 8 oz (74.6 kg)    GEN: Well nourished, well developed in no acute distress HEENT: Normal, moist mucous membranes NECK: No JVD CARDIAC: regular rhythm, normal S1 and S2, no rubs or gallops. No murmur. VASCULAR: Radial and DP pulses 2+ bilaterally. No carotid bruits RESPIRATORY:  Clear to auscultation without rales, wheezing or rhonchi  ABDOMEN: Soft, non-tender, non-distended MUSCULOSKELETAL:  Ambulates independently SKIN: Warm and dry, no edema NEUROLOGIC:  Alert and oriented x 3. No focal neuro deficits noted. PSYCHIATRIC:  Normal affect   ASSESSMENT AND PLAN: .    History of SCC of the head and neck, with chemo/radiation -now tolerating oral intake, PEG tube removed  Prostate cancer -on Xtandi, which interacts with DOACs.    Atrial fibrillation, initially with RVR -in rate controlled atrial fibrillation today -CHA2DS2/VAS Stroke Risk Points=2  -continue coumadin -tolerating diltiazem 120 mg ER twice a day,  metoprolol tartrate 25 mg BID -we discussed rhythm control again today. He declines at this time   Cardiac risk counseling and prevention recommendations: -recommend heart healthy/Mediterranean diet, with whole grains, fruits, vegetable, fish, lean meats, nuts, and olive oil. Limit salt. -recommend moderate walking, 3-5 times/week for 30-50 minutes each session. Aim for at least 150  minutes.week. Goal should be pace of 3 miles/hours, or walking 1.5 miles in 30 minutes -recommend avoidance of tobacco products. Avoid excess alcohol.  Dispo: Follow-up in 6 months, or sooner as needed.  I,Mathew Stumpf,acting as a Neurosurgeon for [employer], MD.,have documented all relevant documentation on the behalf of Jodelle Red, MD,as directed by  Jodelle Red, MD while in the presence of Jodelle Red, MD.  I, Jodelle Red, MD, have reviewed all documentation for this visit. The documentation on 11/21/22 for the exam, diagnosis, procedures, and orders are all accurate and complete.   Signed, Jodelle Red, MD

## 2022-11-21 NOTE — Patient Instructions (Signed)
Medication Instructions:  Continue current medications  *If you need a refill on your cardiac medications before your next appointment, please call your pharmacy*   Lab Work: None Ordered   Testing/Procedures: None Ordered   Follow-Up: At [facility], you and your health needs are our priority.  As part of our continuing mission to provide you with exceptional heart care, we have created designated Provider Care Teams.  These Care Teams include your primary Cardiologist (physician) and Advanced Practice Providers (APPs -  Physician Assistants and Nurse Practitioners) who all work together to provide you with the care you need, when you need it.  We recommend signing up for the patient portal called "MyChart".  Sign up information is provided on this After Visit Summary.  MyChart is used to connect with patients for Virtual Visits (Telemedicine).  Patients are able to view lab/test results, encounter notes, upcoming appointments, etc.  Non-urgent messages can be sent to your provider as well.   To learn more about what you can do with MyChart, go to [email or web address].    Your next appointment:   6 month(s)  Provider:   Buford Dresser, MD    Other Instructions

## 2022-11-27 ENCOUNTER — Ambulatory Visit: Payer: PPO | Attending: Cardiovascular Disease

## 2022-11-27 DIAGNOSIS — Z5181 Encounter for therapeutic drug level monitoring: Secondary | ICD-10-CM

## 2022-11-27 DIAGNOSIS — I4891 Unspecified atrial fibrillation: Secondary | ICD-10-CM

## 2022-11-27 LAB — POCT INR: INR: 1.7 — AB (ref 2.0–3.0)

## 2022-11-27 NOTE — Patient Instructions (Addendum)
Description   START taking warfarin 1/2 tablet (2.5mg ) daily except 1 tablet (5mg ) on Sundays and Thursdays.  Stay consistent with greens each week.  Recheck INR in 2 weeks.  Anticoagulation Clinic [phone]

## 2022-11-28 DIAGNOSIS — K1230 Oral mucositis (ulcerative), unspecified: Secondary | ICD-10-CM | POA: Diagnosis not present

## 2022-11-28 DIAGNOSIS — K219 Gastro-esophageal reflux disease without esophagitis: Secondary | ICD-10-CM | POA: Diagnosis not present

## 2022-11-28 DIAGNOSIS — R432 Parageusia: Secondary | ICD-10-CM | POA: Diagnosis not present

## 2022-11-28 DIAGNOSIS — C61 Malignant neoplasm of prostate: Secondary | ICD-10-CM | POA: Diagnosis not present

## 2022-11-28 DIAGNOSIS — K121 Other forms of stomatitis: Secondary | ICD-10-CM | POA: Diagnosis not present

## 2022-12-12 ENCOUNTER — Ambulatory Visit: Payer: PPO | Attending: Cardiology

## 2022-12-12 DIAGNOSIS — Z5181 Encounter for therapeutic drug level monitoring: Secondary | ICD-10-CM

## 2022-12-12 DIAGNOSIS — I4891 Unspecified atrial fibrillation: Secondary | ICD-10-CM

## 2022-12-12 DIAGNOSIS — C61 Malignant neoplasm of prostate: Secondary | ICD-10-CM | POA: Diagnosis not present

## 2022-12-12 LAB — POCT INR: INR: 2.2 (ref 2.0–3.0)

## 2022-12-12 NOTE — Patient Instructions (Signed)
Continue  taking warfarin 1/2 tablet (2.5mg ) daily except 1 tablet (5mg ) on Sundays and Thursdays.  Stay consistent with greens each week.  Recheck INR in 4 weeks.  Anticoagulation Clinic [phone]

## 2022-12-22 DIAGNOSIS — H33192 Other retinoschisis and retinal cysts, left eye: Secondary | ICD-10-CM | POA: Diagnosis not present

## 2022-12-22 DIAGNOSIS — H35373 Puckering of macula, bilateral: Secondary | ICD-10-CM | POA: Diagnosis not present

## 2022-12-22 DIAGNOSIS — H43813 Vitreous degeneration, bilateral: Secondary | ICD-10-CM | POA: Diagnosis not present

## 2023-01-09 ENCOUNTER — Ambulatory Visit: Payer: PPO | Attending: Cardiology

## 2023-01-09 DIAGNOSIS — I4891 Unspecified atrial fibrillation: Secondary | ICD-10-CM | POA: Diagnosis not present

## 2023-01-09 DIAGNOSIS — Z5181 Encounter for therapeutic drug level monitoring: Secondary | ICD-10-CM

## 2023-01-09 LAB — POCT INR: INR: 2.3 (ref 2.0–3.0)

## 2023-01-09 NOTE — Patient Instructions (Signed)
Continue  taking warfarin 1/2 tablet (2.5mg ) daily except 1 tablet (5mg ) on Sundays and Thursdays.  Stay consistent with greens each week.  Recheck INR in 6 weeks.  Anticoagulation Clinic [phone]

## 2023-01-15 DIAGNOSIS — C61 Malignant neoplasm of prostate: Secondary | ICD-10-CM | POA: Diagnosis not present

## 2023-02-02 ENCOUNTER — Inpatient Hospital Stay: Payer: PPO | Attending: Hematology and Oncology | Admitting: Hematology and Oncology

## 2023-02-02 ENCOUNTER — Inpatient Hospital Stay: Payer: PPO

## 2023-02-02 VITALS — BP 131/81 | HR 85 | Temp 97.5°F | Resp 16 | Wt 161.4 lb

## 2023-02-02 DIAGNOSIS — Z923 Personal history of irradiation: Secondary | ICD-10-CM | POA: Diagnosis not present

## 2023-02-02 DIAGNOSIS — C770 Secondary and unspecified malignant neoplasm of lymph nodes of head, face and neck: Secondary | ICD-10-CM

## 2023-02-02 DIAGNOSIS — C61 Malignant neoplasm of prostate: Secondary | ICD-10-CM | POA: Insufficient documentation

## 2023-02-02 DIAGNOSIS — Z7901 Long term (current) use of anticoagulants: Secondary | ICD-10-CM | POA: Insufficient documentation

## 2023-02-02 DIAGNOSIS — I4891 Unspecified atrial fibrillation: Secondary | ICD-10-CM | POA: Diagnosis not present

## 2023-02-02 DIAGNOSIS — Z9079 Acquired absence of other genital organ(s): Secondary | ICD-10-CM | POA: Insufficient documentation

## 2023-02-02 DIAGNOSIS — Z79899 Other long term (current) drug therapy: Secondary | ICD-10-CM | POA: Insufficient documentation

## 2023-02-02 LAB — CBC WITH DIFFERENTIAL/PLATELET
Abs Immature Granulocytes: 0.01 10*3/uL (ref 0.00–0.07)
Basophils Absolute: 0 10*3/uL (ref 0.0–0.1)
Basophils Relative: 0 %
Eosinophils Absolute: 0 10*3/uL (ref 0.0–0.5)
Eosinophils Relative: 2 %
HCT: 36.5 % — ABNORMAL LOW (ref 39.0–52.0)
Hemoglobin: 12.9 g/dL — ABNORMAL LOW (ref 13.0–17.0)
Immature Granulocytes: 0 %
Lymphocytes Relative: 29 %
Lymphs Abs: 0.7 10*3/uL (ref 0.7–4.0)
MCH: 33.7 pg (ref 26.0–34.0)
MCHC: 35.3 g/dL (ref 30.0–36.0)
MCV: 95.3 fL (ref 80.0–100.0)
Monocytes Absolute: 0.3 10*3/uL (ref 0.1–1.0)
Monocytes Relative: 14 %
Neutro Abs: 1.3 10*3/uL — ABNORMAL LOW (ref 1.7–7.7)
Neutrophils Relative %: 55 %
Platelets: 209 10*3/uL (ref 150–400)
RBC: 3.83 MIL/uL — ABNORMAL LOW (ref 4.22–5.81)
RDW: 13.1 % (ref 11.5–15.5)
WBC: 2.4 10*3/uL — ABNORMAL LOW (ref 4.0–10.5)
nRBC: 0 % (ref 0.0–0.2)

## 2023-02-02 LAB — CMP (CANCER CENTER ONLY)
ALT: 9 U/L (ref 0–44)
AST: 15 U/L (ref 15–41)
Albumin: 3.8 g/dL (ref 3.5–5.0)
Alkaline Phosphatase: 23 U/L — ABNORMAL LOW (ref 38–126)
Anion gap: 4 — ABNORMAL LOW (ref 5–15)
BUN: 14 mg/dL (ref 8–23)
CO2: 30 mmol/L (ref 22–32)
Calcium: 9.3 mg/dL (ref 8.9–10.3)
Chloride: 106 mmol/L (ref 98–111)
Creatinine: 0.93 mg/dL (ref 0.61–1.24)
GFR, Estimated: >60 mL/min (ref >60)
Glucose, Bld: 79 mg/dL (ref 70–99)
Potassium: 4.3 mmol/L (ref 3.5–5.1)
Sodium: 140 mmol/L (ref 135–145)
Total Bilirubin: 1.2 mg/dL (ref 0.3–1.2)
Total Protein: 6.5 g/dL (ref 6.5–8.1)

## 2023-02-02 LAB — TSH: TSH: 3.05 u[IU]/mL (ref 0.350–4.500)

## 2023-02-02 NOTE — Progress Notes (Signed)
Hematology and Oncology Follow Up Visit  Nicholas Daniels [id number] 06-Mar-1951 72 y.o. 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    2.   Prostate cancer diagnosed in 2009.  He was found to have T3N0 Gleason score 3+4 = 7 and currently with biochemical relapse.  Prior Therapy:  He underwent prostatectomy and found to have a T3a N0 Gleason score 3+4 = 7 cancer.  He underwent salvage radiation in 2012.   He is status post direct laryngoscopy and left neck dissection completed by Dr. Pollyann Kennedy on December 17, 2020.  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 Adjuvant radiation therapy with weekly cisplatin started on February 05, 2021.   He completed adjuvant radiation therapy with cisplatin in November 2022.  Current therapy: Active surveillance  Interim History: [name] returns today for repeat evaluation.   Discussed the use of AI scribe software for clinical note transcription with the patient, who gave verbal consent to proceed.  History of Present Illness         The patient, with a history of prostate cancer and head and neck cancer, is currently on Xtandi, a medication he has been taking for about five years. He was part of a trial for this medication and reports that it worked well for him. Recently, he has noticed a change in his taste, which he describes as a constant "brown tinnish" taste in his mouth. He is unsure if this is a side effect of the Xtandi or Coumadin, a medication he is taking for atrial fibrillation. The patient also reports ongoing urinary incontinence, which has been a problem for over a year. He uses about three diapers a day to manage this issue.  He otherwise denies any new health complaints.  He has followed  up with Dr. Pollyann Kennedy this summer and there is no evidence of disease recurrence.  Medications: Updated on review. Current Outpatient Medications  Medication Sig Dispense Refill   diltiazem (CARDIZEM CD) 120 MG 24 hr capsule TAKE 1 CAPSULE (120 MG TOTAL) BY MOUTH IN THE MORNING AND AT BEDTIME. 180 capsule 1   latanoprost (XALATAN) 0.005 % ophthalmic solution Place 1 drop into both eyes at bedtime.     metoprolol tartrate (LOPRESSOR) 25 MG tablet Take 1 tablet (25 mg total) by mouth 2 (two) times daily. 180 tablet 1   omeprazole (PRILOSEC) 40 MG capsule Take 40 mg by mouth daily.     timolol (TIMOPTIC) 0.5 % ophthalmic solution Place 1 drop into both eyes daily.     warfarin (COUMADIN) 5 MG tablet TAKE 1 TABLET BY MOUTH EVERY DAY OR AS DIRECTED BY COUMADIN CLINIC 90 tablet 1   XTANDI 40 MG capsule Take 160 mg by mouth daily.     No current facility-administered medications for this visit.     Allergies: No Known Allergies   Physical Exam:  Blood pressure 131/81, pulse 85, temperature (!) 97.5 F (36.4 C), temperature source Temporal, resp. rate 16, weight 161 lb 6.4 oz (73.2 kg), SpO2 100%.  ECOG: 1  General appearance: Alert, awake without any distress. Head: Atraumatic without abnormalities Oropharynx: Without any thrush or ulcers. Eyes: No scleral icterus. Lymph nodes: No lymphadenopathy noted in the cervical, supraclavicular, or axillary nodes Lung: Clear to auscultation without any rhonchi, wheezes or dullness  to percussion. Abdomin: Soft, nontender without any shifting dullness or ascites. Musculoskeletal: No clubbing or cyanosis. Neurological: No motor or sensory deficits. Skin: No rashes or lesions. Psychiatric: Mood and affect appeared normal.  Lab Results: Lab Results  Component Value Date   WBC 4.3 01/31/2022   HGB 13.9 01/31/2022   HCT 40.2 01/31/2022   MCV 90 01/31/2022   PLT 250 01/31/2022     Chemistry      Component Value Date/Time   NA 140 07/03/2021  1325   K 4.5 07/03/2021 1325   CL 106 07/03/2021 1325   CO2 30 07/03/2021 1325   BUN 19 07/03/2021 1325   CREATININE 0.99 07/03/2021 1325   CREATININE 1.10 02/13/2015 1545      Component Value Date/Time   CALCIUM 9.5 07/03/2021 1325   ALKPHOS 27 (L) 07/03/2021 1325   AST 17 07/03/2021 1325   ALT 21 07/03/2021 1325   BILITOT 0.9 07/03/2021 1325     IMPRESSION: No signs of PSMA avid disease recurrence following prostatectomy.   Postoperative changes in the LEFT neck with diminished stranding in the subcutaneous fat. Otherwise grossly stable morphologic changes related to post treatment effect.   Mild bladder wall thickening and perivesical stranding, urinary bladder is under distended and findings are nonspecific. Correlate with any signs of cystitis.   Aortic atherosclerosis, calcified coronary artery disease cholecystectomy changes as described.   Assessment and Plan:    72 year old with:  1.  Squamous cell carcinoma of unknown primary presented with cervical adenopathy in September 2022.  He was found to have stage IV at that time and achieved complete response to therapy. Last visit with Dr Pollyann Kennedy in  2024, no evidence of recurrence. ROS and PE today with no concerns for recurrence. I don't see a TSH done recently.  We will do some labs today including PSA and TSH.   2.  Prostate cancer diagnosed in 2012.  He is developing biochemical relapse without any evidence of measurable disease based on pet imaging in February 2024.  He tells me that Dr. Annabell Howells from urology is planning to start him on Xtandi.  Will try to get a copy of Dr. Belva Crome note so we can review his most recent labs as well as plan.  He was encouraged to call us back if he needs any assistance with Xtandi.  3.  Atrial fibrillation: He continues to follow with cardiology regarding this issue. He is currently on warfarin for anticoagulation.  He is interested in knowing more about the Watchman device.  4.  Thyroid Function History of neck cancer with radiation and chemotherapy. Unclear if thyroid function has been monitored. -Order thyroid function tests. -Check thyroid function annually.  Follow-up No new concerns or symptoms. -Return to clinic in 6 months. -If PSA remains undetectable, no need for labs at next appointment.   30  minutes were spent on this encounter.  The time was dedicated to reviewing laboratory data, disease status update and outlining future plan of care review.   Rachel Moulds, MD 10/28/20242:08 PM

## 2023-02-03 LAB — PSA, TOTAL AND FREE
PSA, Free Pct: UNDETERMINED %
PSA, Free: <0.02 ng/mL
Prostate Specific Ag, Serum: <0.1 ng/mL (ref 0.0–4.0)

## 2023-02-13 DIAGNOSIS — C770 Secondary and unspecified malignant neoplasm of lymph nodes of head, face and neck: Secondary | ICD-10-CM | POA: Diagnosis not present

## 2023-02-13 DIAGNOSIS — Z923 Personal history of irradiation: Secondary | ICD-10-CM | POA: Diagnosis not present

## 2023-02-20 ENCOUNTER — Ambulatory Visit: Payer: PPO | Attending: Internal Medicine

## 2023-02-20 DIAGNOSIS — Z5181 Encounter for therapeutic drug level monitoring: Secondary | ICD-10-CM

## 2023-02-20 DIAGNOSIS — I4891 Unspecified atrial fibrillation: Secondary | ICD-10-CM | POA: Diagnosis not present

## 2023-02-20 LAB — POCT INR: INR: 2.1 (ref 2.0–3.0)

## 2023-02-20 NOTE — Patient Instructions (Signed)
Continue taking warfarin 1/2 tablet (2.5mg ) daily except 1 tablet (5mg ) on Sundays and Thursdays.  Stay consistent with greens each week.  Recheck INR in 7 weeks.  Anticoagulation Clinic [phone]

## 2023-02-23 DIAGNOSIS — H04123 Dry eye syndrome of bilateral lacrimal glands: Secondary | ICD-10-CM | POA: Diagnosis not present

## 2023-02-23 DIAGNOSIS — H35373 Puckering of macula, bilateral: Secondary | ICD-10-CM | POA: Diagnosis not present

## 2023-02-23 DIAGNOSIS — H40013 Open angle with borderline findings, low risk, bilateral: Secondary | ICD-10-CM | POA: Diagnosis not present

## 2023-02-23 DIAGNOSIS — Z961 Presence of intraocular lens: Secondary | ICD-10-CM | POA: Diagnosis not present

## 2023-03-08 ENCOUNTER — Other Ambulatory Visit (HOSPITAL_BASED_OUTPATIENT_CLINIC_OR_DEPARTMENT_OTHER): Payer: Self-pay | Admitting: Family

## 2023-03-26 ENCOUNTER — Other Ambulatory Visit: Payer: Self-pay | Admitting: Internal Medicine

## 2023-03-26 DIAGNOSIS — Z13820 Encounter for screening for osteoporosis: Secondary | ICD-10-CM

## 2023-04-06 ENCOUNTER — Other Ambulatory Visit: Payer: PPO

## 2023-04-09 DIAGNOSIS — C61 Malignant neoplasm of prostate: Secondary | ICD-10-CM | POA: Diagnosis not present

## 2023-04-10 ENCOUNTER — Ambulatory Visit: Payer: PPO | Attending: Cardiovascular Disease | Admitting: *Deleted

## 2023-04-10 DIAGNOSIS — Z5181 Encounter for therapeutic drug level monitoring: Secondary | ICD-10-CM | POA: Diagnosis not present

## 2023-04-10 DIAGNOSIS — I4891 Unspecified atrial fibrillation: Secondary | ICD-10-CM | POA: Diagnosis not present

## 2023-04-10 LAB — POCT INR: INR: 1.6 — AB (ref 2.0–3.0)

## 2023-04-10 NOTE — Patient Instructions (Signed)
 Description   Tonight take 1 tablet of warfarin then continue taking warfarin 1/2 tablet (2.5mg ) daily except 1 tablet (5mg ) on Sundays and Thursdays.  Stay consistent with greens each week.  Recheck INR in 5 weeks.  Anticoagulation Clinic [phone]

## 2023-04-14 DIAGNOSIS — I10 Essential (primary) hypertension: Secondary | ICD-10-CM | POA: Diagnosis not present

## 2023-04-14 DIAGNOSIS — E785 Hyperlipidemia, unspecified: Secondary | ICD-10-CM | POA: Diagnosis not present

## 2023-04-14 DIAGNOSIS — Z1212 Encounter for screening for malignant neoplasm of rectum: Secondary | ICD-10-CM | POA: Diagnosis not present

## 2023-04-15 ENCOUNTER — Other Ambulatory Visit: Payer: Self-pay | Admitting: Internal Medicine

## 2023-04-15 DIAGNOSIS — Z13820 Encounter for screening for osteoporosis: Secondary | ICD-10-CM

## 2023-04-16 DIAGNOSIS — C61 Malignant neoplasm of prostate: Secondary | ICD-10-CM | POA: Diagnosis not present

## 2023-04-16 DIAGNOSIS — N393 Stress incontinence (female) (male): Secondary | ICD-10-CM | POA: Diagnosis not present

## 2023-04-18 ENCOUNTER — Other Ambulatory Visit: Payer: PPO

## 2023-04-21 DIAGNOSIS — Z1331 Encounter for screening for depression: Secondary | ICD-10-CM | POA: Diagnosis not present

## 2023-04-21 DIAGNOSIS — Z7901 Long term (current) use of anticoagulants: Secondary | ICD-10-CM | POA: Diagnosis not present

## 2023-04-21 DIAGNOSIS — I4819 Other persistent atrial fibrillation: Secondary | ICD-10-CM | POA: Diagnosis not present

## 2023-04-21 DIAGNOSIS — R82998 Other abnormal findings in urine: Secondary | ICD-10-CM | POA: Diagnosis not present

## 2023-04-21 DIAGNOSIS — K219 Gastro-esophageal reflux disease without esophagitis: Secondary | ICD-10-CM | POA: Diagnosis not present

## 2023-04-21 DIAGNOSIS — C61 Malignant neoplasm of prostate: Secondary | ICD-10-CM | POA: Diagnosis not present

## 2023-04-21 DIAGNOSIS — Z0000 Encounter for general adult medical examination without abnormal findings: Secondary | ICD-10-CM | POA: Diagnosis not present

## 2023-04-21 DIAGNOSIS — D6869 Other thrombophilia: Secondary | ICD-10-CM | POA: Diagnosis not present

## 2023-04-21 DIAGNOSIS — I10 Essential (primary) hypertension: Secondary | ICD-10-CM | POA: Diagnosis not present

## 2023-04-21 DIAGNOSIS — I700 Atherosclerosis of aorta: Secondary | ICD-10-CM | POA: Diagnosis not present

## 2023-04-21 DIAGNOSIS — Z1339 Encounter for screening examination for other mental health and behavioral disorders: Secondary | ICD-10-CM | POA: Diagnosis not present

## 2023-04-26 ENCOUNTER — Other Ambulatory Visit (HOSPITAL_BASED_OUTPATIENT_CLINIC_OR_DEPARTMENT_OTHER): Payer: Self-pay | Admitting: Family

## 2023-04-26 DIAGNOSIS — I4819 Other persistent atrial fibrillation: Secondary | ICD-10-CM

## 2023-05-07 ENCOUNTER — Other Ambulatory Visit: Payer: PPO

## 2023-05-08 ENCOUNTER — Encounter: Payer: Self-pay | Admitting: Hematology and Oncology

## 2023-05-11 ENCOUNTER — Encounter: Payer: Self-pay | Admitting: Physician Assistant

## 2023-05-15 ENCOUNTER — Ambulatory Visit: Payer: HMO | Attending: Internal Medicine

## 2023-05-15 DIAGNOSIS — Z5181 Encounter for therapeutic drug level monitoring: Secondary | ICD-10-CM | POA: Diagnosis not present

## 2023-05-15 DIAGNOSIS — I4891 Unspecified atrial fibrillation: Secondary | ICD-10-CM

## 2023-05-15 LAB — POCT INR: INR: 1.8 — AB (ref 2.0–3.0)

## 2023-05-15 NOTE — Patient Instructions (Signed)
 Increase to 1/2 tablet (2.5mg ) daily except 1 tablet (5mg ) on Mondays, Wednesdays and Fridays.  Stay consistent with greens each week.  Recheck INR in 5 weeks.  Anticoagulation Clinic [phone]

## 2023-06-01 DIAGNOSIS — C770 Secondary and unspecified malignant neoplasm of lymph nodes of head, face and neck: Secondary | ICD-10-CM | POA: Diagnosis not present

## 2023-06-01 DIAGNOSIS — Z923 Personal history of irradiation: Secondary | ICD-10-CM | POA: Diagnosis not present

## 2023-06-02 DIAGNOSIS — Z85828 Personal history of other malignant neoplasm of skin: Secondary | ICD-10-CM | POA: Diagnosis not present

## 2023-06-02 DIAGNOSIS — D2261 Melanocytic nevi of right upper limb, including shoulder: Secondary | ICD-10-CM | POA: Diagnosis not present

## 2023-06-02 DIAGNOSIS — L821 Other seborrheic keratosis: Secondary | ICD-10-CM | POA: Diagnosis not present

## 2023-06-02 DIAGNOSIS — D225 Melanocytic nevi of trunk: Secondary | ICD-10-CM | POA: Diagnosis not present

## 2023-06-02 DIAGNOSIS — D2272 Melanocytic nevi of left lower limb, including hip: Secondary | ICD-10-CM | POA: Diagnosis not present

## 2023-06-02 DIAGNOSIS — L905 Scar conditions and fibrosis of skin: Secondary | ICD-10-CM | POA: Diagnosis not present

## 2023-06-02 DIAGNOSIS — L570 Actinic keratosis: Secondary | ICD-10-CM | POA: Diagnosis not present

## 2023-06-02 DIAGNOSIS — D2262 Melanocytic nevi of left upper limb, including shoulder: Secondary | ICD-10-CM | POA: Diagnosis not present

## 2023-06-02 DIAGNOSIS — D2271 Melanocytic nevi of right lower limb, including hip: Secondary | ICD-10-CM | POA: Diagnosis not present

## 2023-06-03 ENCOUNTER — Encounter (HOSPITAL_BASED_OUTPATIENT_CLINIC_OR_DEPARTMENT_OTHER): Payer: Self-pay | Admitting: Cardiology

## 2023-06-03 ENCOUNTER — Ambulatory Visit (INDEPENDENT_AMBULATORY_CARE_PROVIDER_SITE_OTHER): Payer: HMO | Admitting: Cardiology

## 2023-06-03 VITALS — BP 112/60 | HR 60 | Ht 67.0 in | Wt 170.7 lb

## 2023-06-03 DIAGNOSIS — Z7901 Long term (current) use of anticoagulants: Secondary | ICD-10-CM | POA: Diagnosis not present

## 2023-06-03 DIAGNOSIS — I4821 Permanent atrial fibrillation: Secondary | ICD-10-CM

## 2023-06-03 DIAGNOSIS — R42 Dizziness and giddiness: Secondary | ICD-10-CM

## 2023-06-03 DIAGNOSIS — Z8546 Personal history of malignant neoplasm of prostate: Secondary | ICD-10-CM

## 2023-06-03 DIAGNOSIS — D6869 Other thrombophilia: Secondary | ICD-10-CM

## 2023-06-03 NOTE — Progress Notes (Signed)
 Cardiology Office Note:  .    Date:  06/03/2023  ID:  OTHMAN MASUR, DOB March 18, 1951, MRN [id number] PCP: Garlan Fillers, MD  [facility] Providers Cardiologist:  Jodelle Red, MD     History of Present Illness: .    Nicholas Daniels is a 73 y.o. 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 I met him during his prior hospitalization (discharged 03/12/21).   Today: Gets intermittent dizziness. Most recent event was when he was walking in [employer], had to stop for a bit. No clear sense of motion, hard to describe. No shortness of breath, no nausea/diaphoresis. Lasts about 5 seconds.   Has not felt any afib/palpitations.  Appetite is ok, gaining a little weight  Denies chest pain, shortness of breath at rest or with normal exertion. No PND, orthopnea, LE edema or unexpected weight gain. No syncope or palpitations. ROS otherwise negative except as noted.   ROS:  Please see the history of present illness. ROS otherwise negative except as noted.   Studies Reviewed: Marland Kitchen    EKG Interpretation Date/Time:  Wednesday June 03 2023 16:47:13 EST Ventricular Rate:  56 PR Interval:  160 QRS Duration:  114 QT Interval:  450 QTC Calculation: 434 R Axis:   36  Text Interpretation: Sinus bradycardia Right bundle branch block Confirmed by Jodelle Red [phone]) on 06/03/2023 4:49:57 PM      Physical Exam:    VS:  BP 112/60   Pulse 60   Ht 5\' 7"  (1.702 m)   Wt 170 lb 11.2 oz (77.4 kg)   SpO2 99%   BMI 26.74 kg/m    Wt Readings from Last 3 Encounters:  06/03/23 170 lb 11.2 oz (77.4 kg)  02/02/23 161 lb 6.4 oz (73.2 kg)  11/21/22 163 lb (73.9 kg)    GEN: Well nourished, well developed in no acute distress HEENT: Normal, moist mucous membranes NECK: No JVD CARDIAC: regular rhythm, normal S1 and S2, no rubs  or gallops. No murmur. VASCULAR: Radial and DP pulses 2+ bilaterally. No carotid bruits RESPIRATORY:  Clear to auscultation without rales, wheezing or rhonchi  ABDOMEN: Soft, non-tender, non-distended MUSCULOSKELETAL:  Ambulates independently SKIN: Warm and dry, no edema NEUROLOGIC:  Alert and oriented x 3. No focal neuro deficits noted. PSYCHIATRIC:  Normal affect    ASSESSMENT AND PLAN: .    Episodic lightheadedness -brief, no clear pattern, no syncope -eating/drinking well -continue to monitor -gave instructions on red flag warning signs that need medical attention  History of SCC of the head and neck, with chemo/radiation -now tolerating oral intake, PEG tube removed  Prostate cancer, in remission -on Xtandi, which interacts with DOACs.    Atrial fibrillation, had be considered to be permanent, but in sinus rhythm today -first time he has been in sinus at one of our visits -CHA2DS2/VAS Stroke Risk Points=2  -continue coumadin -tolerating diltiazem 120 mg ER twice a day, metoprolol tartrate 25 mg BID -we have discussed rhythm control, he declines   Cardiac risk counseling and prevention recommendations: -recommend heart healthy/Mediterranean diet, with whole grains, fruits, vegetable, fish, lean meats, nuts, and olive oil. Limit salt. -recommend moderate walking, 3-5 times/week for 30-50 minutes each session. Aim for at least 150 minutes.week. Goal should be pace of 3 miles/hours, or walking 1.5 miles in 30 minutes -recommend avoidance of tobacco products. Avoid excess  alcohol.  Dispo: Follow-up in 6 months, or sooner as needed.  Signed, Jodelle Red, MD

## 2023-06-03 NOTE — Patient Instructions (Signed)
 Medication Instructions:  No changes *If you need a refill on your cardiac medications before your next appointment, please call your pharmacy*  Lab Work: No labs If you have labs (blood work) drawn today and your tests are completely normal, you will receive your results only by: MyChart Message (if you have MyChart) OR A paper copy in the mail If you have any lab test that is abnormal or we need to change your treatment, we will call you to review the results.  Testing/Procedures: No testing  Follow-Up: At [facility], you and your health needs are our priority.  As part of our continuing mission to provide you with exceptional heart care, we have created designated Provider Care Teams.  These Care Teams include your primary Cardiologist (physician) and Advanced Practice Providers (APPs -  Physician Assistants and Nurse Practitioners) who all work together to provide you with the care you need, when you need it.  We recommend signing up for the patient portal called "MyChart".  Sign up information is provided on this After Visit Summary.  MyChart is used to connect with patients for Virtual Visits (Telemedicine).  Patients are able to view lab/test results, encounter notes, upcoming appointments, etc.  Non-urgent messages can be sent to your provider as well.   To learn more about what you can do with MyChart, go to [email or web address].    Your next appointment:   6 month(s)  Provider:   Jodelle Red, MD

## 2023-06-19 ENCOUNTER — Telehealth: Payer: Self-pay

## 2023-06-19 ENCOUNTER — Ambulatory Visit: Payer: HMO | Admitting: Physician Assistant

## 2023-06-19 ENCOUNTER — Ambulatory Visit: Payer: HMO | Attending: Cardiology

## 2023-06-19 ENCOUNTER — Encounter: Payer: Self-pay | Admitting: Physician Assistant

## 2023-06-19 ENCOUNTER — Other Ambulatory Visit

## 2023-06-19 VITALS — BP 116/60 | HR 56 | Ht 67.0 in | Wt 169.0 lb

## 2023-06-19 DIAGNOSIS — Z923 Personal history of irradiation: Secondary | ICD-10-CM

## 2023-06-19 DIAGNOSIS — I4891 Unspecified atrial fibrillation: Secondary | ICD-10-CM | POA: Diagnosis not present

## 2023-06-19 DIAGNOSIS — D649 Anemia, unspecified: Secondary | ICD-10-CM | POA: Diagnosis not present

## 2023-06-19 DIAGNOSIS — R195 Other fecal abnormalities: Secondary | ICD-10-CM | POA: Diagnosis not present

## 2023-06-19 DIAGNOSIS — K644 Residual hemorrhoidal skin tags: Secondary | ICD-10-CM | POA: Diagnosis not present

## 2023-06-19 DIAGNOSIS — Z859 Personal history of malignant neoplasm, unspecified: Secondary | ICD-10-CM

## 2023-06-19 DIAGNOSIS — Z7901 Long term (current) use of anticoagulants: Secondary | ICD-10-CM

## 2023-06-19 DIAGNOSIS — Z8546 Personal history of malignant neoplasm of prostate: Secondary | ICD-10-CM

## 2023-06-19 DIAGNOSIS — E538 Deficiency of other specified B group vitamins: Secondary | ICD-10-CM

## 2023-06-19 DIAGNOSIS — K649 Unspecified hemorrhoids: Secondary | ICD-10-CM

## 2023-06-19 DIAGNOSIS — Z5181 Encounter for therapeutic drug level monitoring: Secondary | ICD-10-CM | POA: Diagnosis not present

## 2023-06-19 DIAGNOSIS — C760 Malignant neoplasm of head, face and neck: Secondary | ICD-10-CM

## 2023-06-19 DIAGNOSIS — K648 Other hemorrhoids: Secondary | ICD-10-CM

## 2023-06-19 LAB — CBC WITH DIFFERENTIAL/PLATELET
Basophils Absolute: 0 10*3/uL (ref 0.0–0.1)
Basophils Relative: 0.2 % (ref 0.0–3.0)
Eosinophils Absolute: 0.1 10*3/uL (ref 0.0–0.7)
Eosinophils Relative: 2.2 % (ref 0.0–5.0)
HCT: 40.9 % (ref 39.0–52.0)
Hemoglobin: 14.2 g/dL (ref 13.0–17.0)
Lymphocytes Relative: 20.1 % (ref 12.0–46.0)
Lymphs Abs: 0.8 10*3/uL (ref 0.7–4.0)
MCHC: 34.8 g/dL (ref 30.0–36.0)
MCV: 97.8 fl (ref 78.0–100.0)
Monocytes Absolute: 0.4 10*3/uL (ref 0.1–1.0)
Monocytes Relative: 10.9 % (ref 3.0–12.0)
Neutro Abs: 2.7 10*3/uL (ref 1.4–7.7)
Neutrophils Relative %: 66.6 % (ref 43.0–77.0)
Platelets: 234 10*3/uL (ref 150.0–400.0)
RBC: 4.18 Mil/uL — ABNORMAL LOW (ref 4.22–5.81)
RDW: 13.2 % (ref 11.5–15.5)
WBC: 4 10*3/uL (ref 4.0–10.5)

## 2023-06-19 LAB — VITAMIN B12: Vitamin B-12: 218 pg/mL (ref 211–911)

## 2023-06-19 LAB — COMPREHENSIVE METABOLIC PANEL
ALT: 10 U/L (ref 0–53)
AST: 15 U/L (ref 0–37)
Albumin: 4 g/dL (ref 3.5–5.2)
Alkaline Phosphatase: 26 U/L — ABNORMAL LOW (ref 39–117)
BUN: 15 mg/dL (ref 6–23)
CO2: 27 meq/L (ref 19–32)
Calcium: 9.2 mg/dL (ref 8.4–10.5)
Chloride: 104 meq/L (ref 96–112)
Creatinine, Ser: 0.88 mg/dL (ref 0.40–1.50)
GFR: 85.43 mL/min (ref >60.00)
Glucose, Bld: 84 mg/dL (ref 70–99)
Potassium: 4.7 meq/L (ref 3.5–5.1)
Sodium: 139 meq/L (ref 135–145)
Total Bilirubin: 1.2 mg/dL (ref 0.2–1.2)
Total Protein: 6.4 g/dL (ref 6.0–8.3)

## 2023-06-19 LAB — IBC + FERRITIN
Ferritin: 53.5 ng/mL (ref 22.0–322.0)
Iron: 123 ug/dL (ref 42–165)
Saturation Ratios: 36.9 % (ref 20.0–50.0)
TIBC: 333.2 ug/dL (ref 250.0–450.0)
Transferrin: 238 mg/dL (ref 212.0–360.0)

## 2023-06-19 LAB — POCT INR: INR: 2.1 (ref 2.0–3.0)

## 2023-06-19 MED ORDER — SUFLAVE 178.7 G PO SOLR: 1.0000 | Freq: Once | ORAL | 0 refills | Status: AC

## 2023-06-19 NOTE — Progress Notes (Signed)
 06/19/2023 Nicholas Daniels [id number] 12/13/1950  Referring provider: Garlan Fillers, MD Primary GI doctor: Dr. Lavon Paganini  ASSESSMENT AND PLAN:   Positive FOBT, no overt GI bleeding Colon/EGD 2021 for FOBT + stools,  1 gram drop in HGB from a year ago, normocytic anemia No iron indices No symptoms - Order CBC, iron, ferritin, and B12 tests. - Schedule endoscopy and colonoscopy however if patient does not have IDA, consider not doing - Hold Coumadin prior to procedures, pending permission. - Consult with Dr. Lavon Paganini regarding necessity of procedures.  Hemorrhoids Internal and external hemorrhoids noted. Can contribute to positive fecal occult blood tests, especially with Coumadin. - Advise against prolonged sitting on the toilet.  Atrial fibrillation Atrial fibrillation post-treatment for squamous cell carcinoma, managed on Coumadin due to interactions with enzalutamide. - Continue anticoagulation therapy with Coumadin.  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 No current issues. - Continue routine follow-up for cancer surveillance.  Follow-up Follow-up plans to ensure appropriate monitoring and management of conditions. Endoscopy and colonoscopy decisions based on lab results. - Schedule follow-up appointment after lab results. - Coordinate with Dr. Rusty Aus for further management.   Atrial fibrillation On Coumadin Hold Coumadin for 5 days before procedure will instruct when and how to resume after procedure.  Patient understands that there is a low but real risk of cardiovascular event such as heart attack, stroke, or embolism /  thrombosis, or ischemia while off Coumadin.  The patient consents to proceed.  Will communicate by phone or EMR with patient's prescribing provider to confirm that holding Coumadin is reasonable in this case.    History of squamous cell cancer   2022 s/p radiation and chemo      Patient Care Team: Garlan Fillers, MD as PCP - General [facility] as PCP - Ophthalmology Jodelle Red, MD as PCP - Cardiology (Cardiology) Malmfelt, Lise Auer, RN as Oncology Nurse Navigator Serena Colonel, MD as Consulting Physician (Otolaryngology) Lonie Peak, MD as Attending Physician (Radiation Oncology) Benjiman Core, MD (Inactive) as Consulting Physician (Oncology)  HISTORY OF PRESENT ILLNESS: Discussed the use of AI scribe software for clinical note transcription with the patient, who gave verbal consent to proceed.  History of Present Illness   Nicholas Daniels is a 73 year old male with a history of gastric ulcer and squamous cell carcinoma who presents with positive fecal occult blood.  He presents with a positive fecal occult blood test. No visible blood in stool, dark black stool, or bright red blood. No constipation, diarrhea, abdominal pain, or discomfort. He experiences heartburn and acid reflux, managed with metoprolol. Bowel movements occur approximately every one and a half days without straining.  He has a history of a nonbleeding gastric ulcer identified during an endoscopy. His last colonoscopy in April 2021, prompted by a positive fecal occult blood test, revealed a small polyp, diverticulosis, and internal and external hemorrhoids. He has not undergone a pill capsule endoscopy.  In 2022, he was diagnosed with squamous cell carcinoma of the lymph nodes and tongue, treated with surgery, radiation, and chemotherapy. Post-treatment, he developed atrial fibrillation and is currently on warfarin due to a reaction with enzalutamide, which required a switch from Eliquis.  Swallowing has improved since radiation therapy, though he still experiences dryness and requires liquids to help swallow dry foods. He notes an increase in saliva production compared to immediately post-radiation.  He denies  alcohol  consumption and reports no use of NSAIDs like Aleve or ibuprofen. He has a history of a sternotomy for removal of a calcified cyst near the heart.        He  reports that he has never smoked. He has been exposed to tobacco smoke. He has never used smokeless tobacco. He reports that he does not drink alcohol and does not use drugs.  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 (2022)      CBC    Component Value Date/Time   WBC 2.4 (L) 02/02/2023 1432   RBC 3.83 (L) 02/02/2023 1432   HGB 12.9 (L) 02/02/2023 1432   HGB 13.9 01/31/2022 1701   HCT 36.5 (L) 02/02/2023 1432   HCT 40.2 01/31/2022 1701   PLT 209 02/02/2023 1432   PLT 250 01/31/2022 1701   MCV 95.3 02/02/2023 1432   MCV 90 01/31/2022 1701   MCH 33.7 02/02/2023 1432   MCHC 35.3 02/02/2023 1432   RDW 13.1 02/02/2023 1432   RDW 12.4 01/31/2022 1701   LYMPHSABS 0.7 02/02/2023 1432   MONOABS 0.3 02/02/2023 1432   EOSABS 0.0 02/02/2023 1432   BASOSABS 0.0 02/02/2023 1432   Recent Labs    02/02/23 1432  HGB 12.9*    CMP     Component Value Date/Time   NA 140 02/02/2023 1432   K 4.3 02/02/2023 1432   CL 106 02/02/2023 1432   CO2 30 02/02/2023 1432   GLUCOSE 79 02/02/2023 1432   BUN 14 02/02/2023 1432   CREATININE 0.93 02/02/2023 1432   CREATININE 1.10 02/13/2015 1545   CALCIUM 9.3 02/02/2023 1432   PROT 6.5 02/02/2023 1432   ALBUMIN 3.8 02/02/2023 1432   AST 15 02/02/2023 1432   ALT 9 02/02/2023 1432   ALKPHOS 23 (L) 02/02/2023 1432   BILITOT 1.2 02/02/2023 1432   GFRNONAA >60 02/02/2023 1432   GFRAA >60 02/28/2015 0236      Latest Ref Rng & Units 02/02/2023    2:32 PM 07/03/2021    1:25 PM 06/14/2021    2:09 PM  Hepatic  Function  Total Protein 6.5 - 8.1 g/dL 6.5  6.5  6.4   Albumin 3.5 - 5.0 g/dL 3.8  3.7  3.7   AST 15 - 41 U/L 15  17  16    ALT 0 - 44 U/L 9  21  18    Alk Phosphatase 38 - 126 U/L 23  27  31    Total Bilirubin 0.3 - 1.2 mg/dL 1.2  0.9  0.8       Current Medications:    Current Outpatient Medications (Cardiovascular):    diltiazem (CARDIZEM CD) 120 MG 24 hr capsule, TAKE 1 CAPSULE (120 MG TOTAL) BY MOUTH IN THE MORNING AND AT BEDTIME.   metoprolol tartrate (LOPRESSOR) 25 MG tablet, TAKE 1 TABLET BY MOUTH TWICE A DAY    Current Outpatient Medications (Hematological):    warfarin (COUMADIN) 5 MG tablet, TAKE 1 TABLET BY MOUTH EVERY DAY OR AS DIRECTED BY COUMADIN CLINIC  Current Outpatient Medications (Other):    latanoprost (XALATAN) 0.005 % ophthalmic solution, Place 1 drop into both eyes at bedtime.   omeprazole (PRILOSEC) 40 MG capsule, Take 40 mg by mouth daily.   timolol (TIMOPTIC) 0.5 % ophthalmic solution,  Place 1 drop into both eyes daily.   XTANDI 40 MG capsule, Take 160 mg by mouth daily.  Medical History:  Past Medical History:  Diagnosis Date   Atrial fibrillation (HCC)    Cataract    removed bilateraly   Coronary artery calcification seen on CT scan    Cough    Essential hypertension    Glaucoma    Head and neck cancer (HCC)    Hypertension    PFO (patent foramen ovale)    PFO noted on coronary CT 09/05/14   Prostate cancer (HCC) 2009   Skin cancer    Allergies: No Known Allergies   Surgical History:  He  has a past surgical history that includes Prostate surgery (2009); Cholecystectomy; radiation treatment; Sternotomy (N/A, 02/26/2015); Resection of mediastinal mass (N/A, 02/26/2015); Cataract extraction; Colonoscopy; Upper gastrointestinal endoscopy; Direct laryngoscopy (N/A, 12/17/2020); esophagoscopy (N/A, 12/17/2020); Radical neck dissection (Left, 12/17/2020); IR IMAGING GUIDED PORT INSERTION (01/28/2021); IR GASTROSTOMY TUBE MOD SED (01/28/2021); IR  GASTROSTOMY TUBE REMOVAL/REPAIR (08/29/2021); and IR REMOVAL TUN ACCESS W/ PORT W/O FL MOD SED (08/29/2021). Family History:  His family history includes COPD in his brother, father, and mother; Cancer in his brother; Colon polyps in an other family member; Diabetes in his mother; Heart disease in his brother and mother; Liver cancer in his brother; Pneumonia in his mother.  REVIEW OF SYSTEMS  : All other systems reviewed and negative except where noted in the History of Present Illness.  PHYSICAL EXAM: BP 116/60   Pulse (!) 56   Ht 5\' 7"  (1.702 m)   Wt 169 lb (76.7 kg)   BMI 26.47 kg/m  Physical Exam   GENERAL APPEARANCE: Well nourished, in no apparent distress. HEENT: No cervical lymphadenopathy, unremarkable thyroid, sclerae anicteric, conjunctiva pink. RESPIRATORY: Respiratory effort normal, breath sounds clear bilaterally without rales, rhonchi, or wheezing. CARDIO: Regular rate and rhythm with no murmurs, rubs, or gallops, peripheral pulses intact. ABDOMEN: Soft, non-distended, active bowel sounds in all four quadrants, no tenderness to palpation, no rebound, no mass appreciated. RECTAL: No external hemorrhoids or fissures, no rectal masses, internal hemorrhoids present, hemoccult negative, prostate absent. MUSCULOSKELETAL: Full range of motion, normal gait, without edema. SKIN: Dry, intact without rashes or lesions. No jaundice. NEURO: Alert, oriented, no focal deficits. PSYCH: Cooperative, normal mood and affect. EXTREMITIES: No edema.      Doree Albee, PA-C 2:40 PM

## 2023-06-19 NOTE — Patient Instructions (Addendum)
 Your provider has requested that you go to the basement level for lab work before leaving today. Press "B" on the elevator. The lab is located at the first door on the left as you exit the elevator.  You will be contacted by our office prior to your procedure for directions on holding your Coumadin.  If you do not hear from our office 1 week prior to your scheduled procedure, please call [phone] to discuss.  You have been scheduled for a colonoscopy. Please follow written instructions given to you at your visit today.   If you use inhalers (even only as needed), please bring them with you on the day of your procedure.  DO NOT TAKE 7 DAYS PRIOR TO TEST- Trulicity (dulaglutide) Ozempic, Wegovy (semaglutide) Mounjaro (tirzepatide) Bydureon Bcise (exanatide extended release)  DO NOT TAKE 1 DAY PRIOR TO YOUR TEST Rybelsus (semaglutide) Adlyxin (lixisenatide) Victoza (liraglutide) Byetta (exanatide) ___________________________________________________________________________  Bonita Quin will receive your bowel preparation through [facility], which ensures the lowest copay and home delivery, with outreach via text or call from an [phone] number. Please respond promptly to avoid rescheduling of your procedure. If you are interested in alternative options or have any questions regarding your prep, please contact them at [phone] ____________________________________________________________________________  Your Provider Has Sent Your Bowel Prep Regimen To [facility]   [facility] will contact you to verify your information and collect your copay, if applicable. Enjoy the comfort of your home while your prescription is mailed to you, FREE of any shipping charges.   [facility] accepts all major insurance benefits and applies discounts & coupons.  Have additional questions?   Chat: [email or web address] Call: [phone] Email: [email or web address] NCPDP: [id number]  How will [facility]  contact you?  With a Welcome phone call,  a Welcome text and a checkout link in text form.  Texts you receive from [phone] Are NOT Spam.  *To set up delivery, you must complete the checkout process via link or speak to one of the patient care representatives. If [facility] is unable to reach you, your prescription may be delayed.  To avoid long hold times on the phone, you may also utilize the secure chat feature on the [facility] website to request that they call you back for transaction completion or to expedite your concerns.   _______________________________________________________  If your blood pressure at your visit was 140/90 or greater, please contact your primary care physician to follow up on this.  _______________________________________________________  If you are age 2 or older, your body mass index should be between 23-30. Your Body mass index is 26.47 kg/m. If this is out of the aforementioned range listed, please consider follow up with your Primary Care Provider.  If you are age 87 or younger, your body mass index should be between 19-25. Your Body mass index is 26.47 kg/m. If this is out of the aformentioned range listed, please consider follow up with your Primary Care Provider.   ________________________________________________________  The [facility] providers would like to encourage you to use [facility] to communicate with providers for non-urgent requests or questions.  Due to long hold times on the telephone, sending your provider a message by [facility] may be a faster and more efficient way to get a response.  Please allow 48 business hours for a response.  Please remember that this is for non-urgent requests.  _______________________________________________________

## 2023-06-19 NOTE — Patient Instructions (Signed)
 Continue  1/2 tablet (2.5mg ) daily except 1 tablet (5mg ) on Mondays, Wednesdays and Fridays.  Stay consistent with greens each week.  Recheck INR in 4 weeks.  Anticoagulation Clinic [phone]

## 2023-06-19 NOTE — Telephone Encounter (Signed)
 [facility] Pre-operative Risk Assessment     Request for surgical clearance:     Endoscopy Procedure  What type of surgery is being performed?     EGD/Colon  When is this surgery scheduled?     07/31/23  What type of clearance is required ?   Pharmacy  Are there any medications that need to be held prior to surgery and how long? Coumadin x 5 days  Practice name and name of physician performing surgery?      [facility]  What is your office phone and fax number?      Phone- [phone]  Fax- [phone]  Anesthesia type (None, local, MAC, general) ?       MAC   Please route your response to Jovita Kussmaul, CMA

## 2023-06-23 NOTE — Telephone Encounter (Signed)
 Patient with diagnosis of atrial fibrillation on warfarin for anticoagulation.    hat type of surgery is being performed?     EGD/Colon  When is this surgery scheduled?     07/31/23    CHA2DS2-VASc Score = 3   This indicates a 3.2% annual risk of stroke. The patient's score is based upon: CHF History: 0 HTN History: 1 Diabetes History: 0 Stroke History: 0 Vascular Disease History: 1 Age Score: 1 Gender Score: 0    CrCl 81 Platelet count 234  Per office protocol, patient can hold warfarin for 5 days prior to procedure.   Patient will not need bridging with Lovenox (enoxaparin) around procedure.  **This guidance is not considered finalized until pre-operative APP has relayed final recommendations.**

## 2023-06-23 NOTE — Telephone Encounter (Signed)
 Dr. Cristal Deer, Mr. Colello is requesting preoperative cardiac evaluation for EGD/colonoscopy.  He was recently seen by you in clinic on 06/03/2023.  He reported that he had intermittent dizziness with walking.  He denied shortness of breath and chest pain.  He denied PND orthopnea and lower extremity edema.  Would you be able to comment on cardiac risk for upcoming procedure?  Thank you for your help.  Please direct your response to CV DIV preop pool.  Nicholas Daniels. Keir Foland NP-C     06/23/2023, 8:45 AM [facility] [address] Office [phone] Fax [phone]

## 2023-06-29 NOTE — Telephone Encounter (Signed)
 Attempted to call patient to assess any concerning cardiac symptoms that have occurred since last office visit. Left message for patient to call back.

## 2023-06-29 NOTE — Telephone Encounter (Signed)
   Primary Cardiologist: Jodelle Red, MD  Chart reviewed as part of pre-operative protocol coverage. Given past medical history and time since last visit, based on ACC/AHA guidelines, Nicholas Daniels would be at acceptable risk for the planned procedure without further cardiovascular testing.   Patient was advised that if he develops new symptoms prior to surgery to contact our office to arrange a follow-up appointment.  He verbalized understanding.  Per office protocol, patient can hold warfarin for 5 days prior to procedure.   Patient will not need bridging with Lovenox (enoxaparin) around procedure.  I will route this recommendation to the requesting party via Epic fax function and remove from pre-op pool.  Please call with questions.  Levi Aland, NP-C  06/29/2023, 2:35 PM [address], [address] Office [phone] Fax [phone]

## 2023-06-30 NOTE — Telephone Encounter (Signed)
 Verified that patient is aware to hold warfarin 5 days prior to his procedure.

## 2023-07-16 DIAGNOSIS — C61 Malignant neoplasm of prostate: Secondary | ICD-10-CM | POA: Diagnosis not present

## 2023-07-17 ENCOUNTER — Ambulatory Visit: Attending: Cardiology | Admitting: *Deleted

## 2023-07-17 DIAGNOSIS — Z5181 Encounter for therapeutic drug level monitoring: Secondary | ICD-10-CM | POA: Diagnosis not present

## 2023-07-17 DIAGNOSIS — I4891 Unspecified atrial fibrillation: Secondary | ICD-10-CM | POA: Diagnosis not present

## 2023-07-17 LAB — POCT INR: INR: 2.2 (ref 2.0–3.0)

## 2023-07-17 NOTE — Patient Instructions (Addendum)
 Description   Continue taking warfarin 1/2 tablet (2.5mg ) daily except 1 tablet (5mg ) on Mondays, Wednesdays and Fridays.  Stay consistent with greens each week.  Recheck INR in 1 week after procedure.  Anticoagulation Clinic [phone]     Last dose 07/25/23 No warfarin 07/26/23-07/30/23  After procedure you will take extra half (1/2) tablet for two days then resume normal dose.

## 2023-07-20 ENCOUNTER — Encounter: Payer: Self-pay | Admitting: Hematology and Oncology

## 2023-07-23 ENCOUNTER — Encounter: Payer: Self-pay | Admitting: Gastroenterology

## 2023-07-23 DIAGNOSIS — Z13820 Encounter for screening for osteoporosis: Secondary | ICD-10-CM | POA: Diagnosis not present

## 2023-07-23 DIAGNOSIS — N393 Stress incontinence (female) (male): Secondary | ICD-10-CM | POA: Diagnosis not present

## 2023-07-23 DIAGNOSIS — C61 Malignant neoplasm of prostate: Secondary | ICD-10-CM | POA: Diagnosis not present

## 2023-07-24 ENCOUNTER — Telehealth: Payer: Self-pay | Admitting: Cardiology

## 2023-07-24 NOTE — Telephone Encounter (Signed)
 Called and spoke to pt and wife. Advised patient of MD's recommendations and pt is in agreement. Will remove Diltazem from med list.   Sheryle Donning, MD  You 50 minutes ago (12:12 PM)   Yes, ok to stop a medication, though I'd prefer to keep the metoprolol  and stop the diltiazem  first (if he is still taking this) as this has more effect on blood pressure than metoprolol . If his BP is still low after stopping diltiazem , please have him let us  know and then we can discuss the metoprolol . His heart rates have gone very fast in the past, so we need to balance that with his symptoms. Also please encourage him to stay very hydrated, can wear compression socks as well to see if this will help.

## 2023-07-24 NOTE — Telephone Encounter (Signed)
 Called and spoke to patient. Patient verified with 2 identifiers (DOB and Last Name)  Patient's concerns: Pt felt dizzy, passed out and vision went black on Wednesday at work. Denies hitting his head. Pt states this has happened this before but it was not as severe back in 2023.   Urologist recommended pt stopping Metoprolol . Would like to follow up with cardiology to ensure this recommendation is okay.  BP: 98/55 coming home from work Wednesday.  Nurse's Recommendations: Will follow up with MD.   Patient verbalized understanding.

## 2023-07-24 NOTE — Telephone Encounter (Signed)
 Pt c/o medication issue:  1. Name of Medication:metoprolol  tartrate (LOPRESSOR ) 25 MG tablet    2. How are you currently taking this medication (dosage and times per day)? N/A  3. Are you having a reaction (difficulty breathing--STAT)? No  4. What is your medication issue? Pts Urologist told him to stop talking this medication due to pt feeling faint on Wednesday at work however pt would like to make sure that's okay with his cardiologist.

## 2023-07-31 ENCOUNTER — Encounter: Payer: Self-pay | Admitting: Gastroenterology

## 2023-07-31 ENCOUNTER — Ambulatory Visit (AMBULATORY_SURGERY_CENTER): Admitting: Gastroenterology

## 2023-07-31 VITALS — BP 141/73 | HR 62 | Temp 97.9°F | Resp 11 | Ht 67.0 in | Wt 169.0 lb

## 2023-07-31 DIAGNOSIS — K644 Residual hemorrhoidal skin tags: Secondary | ICD-10-CM

## 2023-07-31 DIAGNOSIS — K208 Other esophagitis without bleeding: Secondary | ICD-10-CM

## 2023-07-31 DIAGNOSIS — K449 Diaphragmatic hernia without obstruction or gangrene: Secondary | ICD-10-CM | POA: Diagnosis not present

## 2023-07-31 DIAGNOSIS — K2951 Unspecified chronic gastritis with bleeding: Secondary | ICD-10-CM | POA: Diagnosis not present

## 2023-07-31 DIAGNOSIS — K648 Other hemorrhoids: Secondary | ICD-10-CM

## 2023-07-31 DIAGNOSIS — R195 Other fecal abnormalities: Secondary | ICD-10-CM

## 2023-07-31 DIAGNOSIS — K5730 Diverticulosis of large intestine without perforation or abscess without bleeding: Secondary | ICD-10-CM

## 2023-07-31 DIAGNOSIS — K2100 Gastro-esophageal reflux disease with esophagitis, without bleeding: Secondary | ICD-10-CM

## 2023-07-31 DIAGNOSIS — K2950 Unspecified chronic gastritis without bleeding: Secondary | ICD-10-CM

## 2023-07-31 DIAGNOSIS — D649 Anemia, unspecified: Secondary | ICD-10-CM | POA: Diagnosis not present

## 2023-07-31 DIAGNOSIS — D120 Benign neoplasm of cecum: Secondary | ICD-10-CM | POA: Diagnosis not present

## 2023-07-31 DIAGNOSIS — I10 Essential (primary) hypertension: Secondary | ICD-10-CM | POA: Diagnosis not present

## 2023-07-31 DIAGNOSIS — K635 Polyp of colon: Secondary | ICD-10-CM | POA: Diagnosis not present

## 2023-07-31 DIAGNOSIS — I4891 Unspecified atrial fibrillation: Secondary | ICD-10-CM | POA: Diagnosis not present

## 2023-07-31 MED ORDER — SODIUM CHLORIDE 0.9 % IV SOLN
500.0000 mL | INTRAVENOUS | Status: DC
Start: 2023-07-31 — End: 2023-07-31

## 2023-07-31 MED ORDER — PANTOPRAZOLE SODIUM 40 MG PO TBEC: 40.0000 mg | DELAYED_RELEASE_TABLET | Freq: Two times a day (BID) | ORAL | 3 refills | Status: AC

## 2023-07-31 NOTE — Patient Instructions (Addendum)
 -Handout on polyps, diverticulosis, hiatal hernia and hemorrhoids provided -await pathology results -repeat colonoscopy for surveillance in 3 years recommended -Continue present medications -Resume Coumadin  (warfarin) at prior dose -Use Protonix  (pantoprazole ) 40 mg by mouth twice a day. Stop using Omeprazole .     YOU HAD AN ENDOSCOPIC PROCEDURE TODAY AT THE [facility]:   Refer to the procedure report that was given to you for any specific questions about what was found during the examination.  If the procedure report does not answer your questions, please call your gastroenterologist to clarify.  If you requested that your care partner not be given the details of your procedure findings, then the procedure report has been included in a sealed envelope for you to review at your convenience later.  YOU SHOULD EXPECT: Some feelings of bloating in the abdomen. Passage of more gas than usual.  Walking can help get rid of the air that was put into your GI tract during the procedure and reduce the bloating. If you had a lower endoscopy (such as a colonoscopy or flexible sigmoidoscopy) you may notice spotting of blood in your stool or on the toilet paper. If you underwent a bowel prep for your procedure, you may not have a normal bowel movement for a few days.  Please Note:  You might notice some irritation and congestion in your nose or some drainage.  This is from the oxygen used during your procedure.  There is no need for concern and it should clear up in a day or so.  SYMPTOMS TO REPORT IMMEDIATELY:  Following lower endoscopy (colonoscopy or flexible sigmoidoscopy):  Excessive amounts of blood in the stool  Significant tenderness or worsening of abdominal pains  Swelling of the abdomen that is new, acute  Fever of 100F or higher  Following upper endoscopy (EGD)  Vomiting of blood or coffee ground material  New chest pain or pain under the shoulder blades  Painful or persistently  difficult swallowing  New shortness of breath  Fever of 100F or higher  Black, tarry-looking stools  For urgent or emergent issues, a gastroenterologist can be reached at any hour by calling [phone]. Do not use MyChart messaging for urgent concerns.    DIET:  We do recommend a small meal at first, but then you may proceed to your regular diet.  Drink plenty of fluids but you should avoid alcoholic beverages for 24 hours.  ACTIVITY:  You should plan to take it easy for the rest of today and you should NOT DRIVE or use heavy machinery until tomorrow (because of the sedation medicines used during the test).    FOLLOW UP: Our staff will call the number listed on your records the next business day following your procedure.  We will call around 7:15- 8:00 am to check on you and address any questions or concerns that you may have regarding the information given to you following your procedure. If we do not reach you, we will leave a message.     If any biopsies were taken you will be contacted by phone or by letter within the next 1-3 weeks.  Please call us  at [phone] if you have not heard about the biopsies in 3 weeks.    SIGNATURES/CONFIDENTIALITY: You and/or your care partner have signed paperwork which will be entered into your electronic medical record.  These signatures attest to the fact that that the information above on your After Visit Summary has been reviewed and is understood.  Full responsibility of the confidentiality of this discharge information lies with you and/or your care-partner.   Hiatal Hernia  A hiatal hernia occurs when part of the stomach slides above the muscle that separates the abdomen from the chest (diaphragm). A person can be born with a hiatal hernia (congenital), or it may develop over time. In almost all cases of hiatal hernia, only the top part of the stomach pushes through the diaphragm. Many people have a hiatal hernia with no symptoms. The  larger the hernia, the more likely it is that you will have symptoms. In some cases, a hiatal hernia allows stomach acid to flow back into the tube that carries food from your mouth to your stomach (esophagus). This may cause heartburn symptoms. The development of heartburn symptoms may mean that you have a condition called gastroesophageal reflux disease (GERD). What are the causes? This condition is caused by a weakness in the opening (hiatus) where the esophagus passes through the diaphragm to attach to the upper part of the stomach. A person may be born with a weakness in the hiatus, or a weakness can develop over time. What increases the risk? This condition is more likely to develop in: Older people. Age is a major risk factor for a hiatal hernia, especially if you are over the age of 33. Pregnant women. People who are overweight. People who have frequent constipation. What are the signs or symptoms? Symptoms of this condition usually develop in the form of GERD symptoms. Symptoms include: Heartburn. Upset stomach (indigestion). Trouble swallowing. Coughing or wheezing. Wheezing is making high-pitched whistling sounds when you breathe. Sore throat. Chest pain. Nausea and vomiting. How is this diagnosed? This condition may be diagnosed during testing for GERD. Tests that may be done include: X-rays of your stomach or chest. An upper gastrointestinal (GI) series. This is an X-ray exam of your GI tract that is taken after you swallow a chalky liquid that shows up clearly on the X-ray. Endoscopy. This is a procedure to look into your stomach using a thin, flexible tube that has a tiny camera and light on the end of it. How is this treated? This condition may be treated by: Dietary and lifestyle changes to help reduce GERD symptoms. Medicines. These may include: Over-the-counter antacids. Medicines that make your stomach empty more quickly. Medicines that block the production of stomach  acid (H2 blockers). Stronger medicines to reduce stomach acid (proton pump inhibitors). Surgery to repair the hernia, if other treatments are not helping. If you have no symptoms, you may not need treatment. Follow these instructions at home: Lifestyle and activity Do not use any products that contain nicotine or tobacco. These products include cigarettes, chewing tobacco, and vaping devices, such as e-cigarettes. If you need help quitting, ask your health care provider. Try to achieve and maintain a healthy body weight. Avoid putting pressure on your abdomen. Anything that puts pressure on your abdomen increases the amount of acid that may be pushed up into your esophagus. Avoid bending over, especially after eating. Raise the head of your bed by putting blocks under the legs. This keeps your head and esophagus higher than your stomach. Do not wear tight clothing around your chest or stomach. Try not to strain when having a bowel movement, when urinating, or when lifting heavy objects. Eating and drinking Avoid foods that can worsen GERD symptoms. These may include: Fatty foods, like fried foods. Citrus fruits, like oranges or lemon. Other foods and drinks that contain acid,  like orange juice or tomatoes. Spicy food. Chocolate. Eat frequent small meals instead of three large meals a day. This helps prevent your stomach from getting too full. Eat slowly. Do not lie down right after eating. Do not eat 1-2 hours before bed. Do not drink beverages with caffeine. These include cola, coffee, cocoa, and tea. Do not drink alcohol . General instructions Take over-the-counter and prescription medicines only as told by your health care provider. Keep all follow-up visits. Your health care provider will want to check that any new prescribed medicines are helping your symptoms. Contact a health care provider if: Your symptoms are not controlled with medicines or lifestyle changes. You are having  trouble swallowing. You have coughing or wheezing that will not go away. Your pain is getting worse. Your pain spreads to your arms, neck, jaw, teeth, or back. You feel nauseous or you vomit. Get help right away if: You have shortness of breath. You vomit blood. You have bright red blood in your stools. You have black, tarry stools. These symptoms may be an emergency. Get help right away. Call 911. Do not wait to see if the symptoms will go away. Do not drive yourself to the hospital. Summary A hiatal hernia occurs when part of the stomach slides above the muscle that separates the abdomen from the chest. A person may be born with a weakness in the hiatus, or a weakness can develop over time. Symptoms of a hiatal hernia may include heartburn, trouble swallowing, or sore throat. Management of a hiatal hernia includes eating frequent small meals instead of three large meals a day. Get help right away if you vomit blood, have bright red blood in your stools, or have black, tarry stools. This information is not intended to replace advice given to you by your health care provider. Make sure you discuss any questions you have with your health care provider. Document Revised: 05/21/2021 Document Reviewed: 05/21/2021 Elsevier Patient Education  2024 [employer].

## 2023-07-31 NOTE — Op Note (Signed)
 [facility] Patient Name: Nicholas Daniels Procedure Date: 07/31/2023 1:59 PM MRN: [id number] Endoscopist: Sergio Dandy , MD, [id number] Age: 73 Referring MD:  Date of Birth: Mar 16, 1951 Gender: Male Account #: [id number] Procedure:                Upper GI endoscopy Indications:              Gastrointestinal bleeding of unknown origin Medicines:                Monitored Anesthesia Care Procedure:                Pre-Anesthesia Assessment:                           - Prior to the procedure, a History and Physical                            was performed, and patient medications and                            allergies were reviewed. The patient's tolerance of                            previous anesthesia was also reviewed. The risks                            and benefits of the procedure and the sedation                            options and risks were discussed with the patient.                            All questions were answered, and informed consent                            was obtained. Prior Anticoagulants: The patient                            last took Coumadin  (warfarin) 5 days prior to the                            procedure. ASA Grade Assessment: III - A patient                            with severe systemic disease. After reviewing the                            risks and benefits, the patient was deemed in                            satisfactory condition to undergo the procedure.                           After obtaining informed consent, the endoscope was  passed under direct vision. Throughout the                            procedure, the patient's blood pressure, pulse, and                            oxygen saturations were monitored continuously. The                            Olympus scope [id number] was introduced through the                            mouth, and advanced to the second part of duodenum.                             The upper GI endoscopy was accomplished without                            difficulty. The patient tolerated the procedure                            well. Scope In: Scope Out: Findings:                 LA Grade B (one or more mucosal breaks greater than                            5 mm, not extending between the tops of two mucosal                            folds) esophagitis with no bleeding was found 37 to                            38 cm from the incisors. Biopsies were taken with a                            cold forceps for histology.                           A small hiatal hernia was present.                           The stomach was normal.                           The cardia and gastric fundus were normal on                            retroflexion.                           The examined duodenum was normal. Complications:            No immediate complications. Estimated Blood Loss:     Estimated blood loss was minimal. Impression:               -  LA Grade B erosive esophagitis with no bleeding.                            Biopsied.                           - Small hiatal hernia.                           - Normal stomach.                           - Normal examined duodenum. Recommendation:           - Resume previous diet.                           - Continue present medications.                           - Await pathology results.                           - Resume Coumadin  (warfarin) at prior dose. Refer                            to Coumadin  Clinic for further adjustment of                            therapy.                           - Use Protonix  (pantoprazole ) 40 mg PO BID. Rx for                            90 days with 3 refills. DC Omeprazole  Sergio Dandy, MD 07/31/2023 2:46:25 PM This report has been signed electronically.

## 2023-07-31 NOTE — Progress Notes (Signed)
 [facility] History and Physical   Primary Care Physician:  Bertha Broad, MD   Reason for Procedure:  FOBT +, anemia  Plan:    EGD and colonoscopy with possible interventions as needed     HPI: Nicholas Daniels is a very pleasant 73 y.o. male here for EGD and colonoscopy for anemia and FOBT+.  Please refer to office visit by Santina Cull for additional details  The risks and benefits as well as alternatives of endoscopic procedure(s) have been discussed and reviewed. All questions answered. The patient agrees to proceed.    Past Medical History:  Diagnosis Date   Atrial fibrillation [facility])    Cataract    removed bilateraly   Coronary artery calcification seen on CT scan    Cough    Essential hypertension    Glaucoma    Head and neck cancer (HCC)    Hypertension    PFO (patent foramen ovale)    PFO noted on coronary CT 09/05/14   Prostate cancer (HCC) 2009   Skin cancer     Past Surgical History:  Procedure Laterality Date   CATARACT EXTRACTION     bilateral and a wrinkle on retina on the left    CHOLECYSTECTOMY     COLONOSCOPY     DIRECT LARYNGOSCOPY N/A 12/17/2020   Procedure: DIRECT LARYNGOSCOPY WITH BIOPSIES;  Surgeon: Janita Mellow, MD;  Location: [facility] OR;  Service: ENT;  Laterality: N/A;   ESOPHAGOSCOPY N/A 12/17/2020   Procedure: ESOPHAGOSCOPY;  Surgeon: Janita Mellow, MD;  Location: [facility] OR;  Service: ENT;  Laterality: N/A;   IR GASTROSTOMY TUBE MOD SED  01/28/2021   IR GASTROSTOMY TUBE REMOVAL  08/29/2021   IR IMAGING GUIDED PORT INSERTION  01/28/2021   IR REMOVAL TUN ACCESS W/ PORT W/O FL MOD SED  08/29/2021   PROSTATE SURGERY  2009   radiation treatment     RADICAL NECK DISSECTION Left 12/17/2020   Procedure: LEFT NECK DISSECTION;  Surgeon: Janita Mellow, MD;  Location: [facility] OR;  Service: ENT;  Laterality: Left;   RESECTION OF MEDIASTINAL MASS N/A 02/26/2015   Procedure: RESECTION OF MEDIASTINAL MASS;  Surgeon: Heriberto London, MD;  Location: [facility]  OR;  Service: Thoracic;  Laterality: N/A;   STERNOTOMY N/A 02/26/2015   Procedure: STERNOTOMY;  Surgeon: Heriberto London, MD;  Location: [facility] OR;  Service: Thoracic;  Laterality: N/A;   UPPER GASTROINTESTINAL ENDOSCOPY      Prior to Admission medications   Medication Sig Start Date End Date Taking? Authorizing Provider  latanoprost  (XALATAN ) 0.005 % ophthalmic solution Place 1 drop into both eyes at bedtime.   Yes [provider]  metoprolol  tartrate (LOPRESSOR ) 25 MG tablet TAKE 1 TABLET BY MOUTH TWICE A DAY 04/28/23  Yes Sheryle Donning, MD  omeprazole  (PRILOSEC) 40 MG capsule Take 40 mg by mouth daily.   Yes [provider]  timolol  (TIMOPTIC ) 0.5 % ophthalmic solution Place 1 drop into both eyes daily.   Yes [provider]  XTANDI 40 MG capsule Take 160 mg by mouth daily.   Yes [provider]  warfarin (COUMADIN ) 5 MG tablet TAKE 1 TABLET BY MOUTH EVERY DAY OR AS DIRECTED BY COUMADIN  CLINIC 03/10/23   Sheryle Donning, MD    Current Outpatient Medications  Medication Sig Dispense Refill   latanoprost  (XALATAN ) 0.005 % ophthalmic solution Place 1 drop into both eyes at bedtime.     metoprolol  tartrate (LOPRESSOR ) 25 MG tablet TAKE 1 TABLET BY MOUTH TWICE A  DAY 180 tablet 2   omeprazole  (PRILOSEC) 40 MG capsule Take 40 mg by mouth daily.     timolol  (TIMOPTIC ) 0.5 % ophthalmic solution Place 1 drop into both eyes daily.     XTANDI 40 MG capsule Take 160 mg by mouth daily.     warfarin (COUMADIN ) 5 MG tablet TAKE 1 TABLET BY MOUTH EVERY DAY OR AS DIRECTED BY COUMADIN  CLINIC 90 tablet 1   Current Facility-Administered Medications  Medication Dose Route Frequency Provider Last Rate Last Admin   0.9 %  sodium chloride  infusion  500 mL Intravenous Continuous Patrik Turnbaugh V, MD        Allergies as of 07/31/2023   (No Known Allergies)    Family History  Problem Relation Age of Onset   Heart disease Mother        MI   COPD Mother     Pneumonia Mother    Diabetes Mother    COPD Father    Heart disease Brother    COPD Brother    Cancer Brother        liver   Liver cancer Brother    Colon polyps Other    Esophageal cancer Neg Hx    Liver disease Neg Hx    Colon cancer Neg Hx     Social History   Socioeconomic History   Marital status: Married    Spouse name: Not on file   Number of children: 2   Years of education: Not on file   Highest education level: Not on file  Occupational History   Not on file  Tobacco Use   Smoking status: Never    Passive exposure: Past   Smokeless tobacco: Never  Vaping Use   Vaping status: Never Used  Substance and Sexual Activity   Alcohol  use: No   Drug use: No   Sexual activity: Not Currently  Other Topics Concern   Not on file  Social History Narrative   Not on file   Social Drivers of Health   Financial Resource Strain: Low Risk  (01/14/2021)   Overall Financial Resource Strain (CARDIA)    Difficulty of Paying Living Expenses: Not hard at all  Food Insecurity: Low Risk  (10/13/2022)   Received from [facility]   Hunger Vital Sign    Worried About Running Out of Food in the Last Year: Never true    Ran Out of Food in the Last Year: Never true  Transportation Needs: Not on file (10/13/2022)  Physical Activity: Not on file  Stress: Not on file  Social Connections: Socially Integrated (01/14/2021)   Social Connection and Isolation Panel [NHANES]    Frequency of Communication with Friends and Family: More than three times a week    Frequency of Social Gatherings with Friends and Family: Once a week    Attends Religious Services: 1 to 4 times per year    Active Member of [employer] or Organizations: Yes    Attends Banker Meetings: 1 to 4 times per year    Marital Status: Married  Catering manager Violence: Not on file    Review of Systems:  All other review of systems negative except as mentioned in the HPI.  Physical Exam: Vital signs in last 24  hours: BP (!) 153/76   Pulse (!) 58   Temp 97.9 F (36.6 C)   Resp 12   Ht 5\' 7"  (1.702 m)   Wt 169 lb (76.7 kg)   SpO2 100%  BMI 26.47 kg/m  General:   Alert, NAD Lungs:  Clear .   Heart:  Regular rate and rhythm Abdomen:  Soft, nontender and nondistended. Neuro/Psych:  Alert and cooperative. Normal mood and affect. A and O x 3  Reviewed labs, radiology imaging, old records and pertinent past GI work up  Patient is appropriate for planned procedure(s) and anesthesia in an ambulatory setting   K. Veena Lameeka Schleifer , MD [phone]

## 2023-07-31 NOTE — Op Note (Signed)
 [facility] Patient Name: Nicholas Daniels Procedure Date: 07/31/2023 1:56 PM MRN: [id number] Endoscopist: Sergio Dandy , MD, [id number] Age: 73 Referring MD:  Date of Birth: 1950-11-22 Gender: Male Account #: [id number] Procedure:                Colonoscopy Indications:              Evaluation of unexplained GI bleeding presenting                            with fecal occult blood Medicines:                Monitored Anesthesia Care Procedure:                Pre-Anesthesia Assessment:                           - Prior to the procedure, a History and Physical                            was performed, and patient medications and                            allergies were reviewed. The patient's tolerance of                            previous anesthesia was also reviewed. The risks                            and benefits of the procedure and the sedation                            options and risks were discussed with the patient.                            All questions were answered, and informed consent                            was obtained. Prior Anticoagulants: The patient                            last took Coumadin  (warfarin) 5 days prior to the                            procedure. ASA Grade Assessment: III - A patient                            with severe systemic disease. After reviewing the                            risks and benefits, the patient was deemed in                            satisfactory condition to undergo the procedure.  After obtaining informed consent, the colonoscope                            was passed under direct vision. Throughout the                            procedure, the patient's blood pressure, pulse, and                            oxygen saturations were monitored continuously. The                            Olympus Scope [id number] was introduced through the                            anus and  advanced to the the cecum, identified by                            appendiceal orifice and ileocecal valve. The                            colonoscopy was performed without difficulty. The                            patient tolerated the procedure well. The quality                            of the bowel preparation was good. The ileocecal                            valve, appendiceal orifice, and rectum were                            photographed. Scope In: 2:21:57 PM Scope Out: 2:34:19 PM Scope Withdrawal Time: 0 hours 8 minutes 35 seconds  Total Procedure Duration: 0 hours 12 minutes 22 seconds  Findings:                 The perianal and digital rectal examinations were                            normal.                           A 10 mm polyp was found in the cecum. The polyp was                            sessile. The polyp was removed with a cold snare.                            Resection and retrieval were complete.                           A few small-mouthed diverticula were found in the  sigmoid colon.                           Non-bleeding external and internal hemorrhoids were                            found during retroflexion. The hemorrhoids were                            medium-sized. Complications:            No immediate complications. Estimated Blood Loss:     Estimated blood loss was minimal. Impression:               - One 10 mm polyp in the cecum, removed with a cold                            snare. Resected and retrieved.                           - Diverticulosis in the sigmoid colon.                           - Non-bleeding external and internal hemorrhoids. Recommendation:           - Patient has a contact number available for                            emergencies. The signs and symptoms of potential                            delayed complications were discussed with the                            patient. Return to normal  activities tomorrow.                            Written discharge instructions were provided to the                            patient.                           - Resume previous diet.                           - Continue present medications.                           - Await pathology results.                           - Repeat colonoscopy in 3 years for surveillance                            based on pathology results. Laporscha Linehan V. Gaynor Genco, MD 07/31/2023 2:40:38 PM This report has been signed electronically.

## 2023-07-31 NOTE — Progress Notes (Signed)
 Pt's states no medical or surgical changes since previsit or office visit.

## 2023-07-31 NOTE — Progress Notes (Signed)
 Report to PACU, RN, vss, BBS= Clear.

## 2023-08-02 ENCOUNTER — Other Ambulatory Visit (HOSPITAL_BASED_OUTPATIENT_CLINIC_OR_DEPARTMENT_OTHER): Payer: Self-pay | Admitting: Cardiology

## 2023-08-02 DIAGNOSIS — I4819 Other persistent atrial fibrillation: Secondary | ICD-10-CM

## 2023-08-03 ENCOUNTER — Telehealth: Payer: Self-pay

## 2023-08-03 ENCOUNTER — Inpatient Hospital Stay: Payer: PPO | Attending: Hematology and Oncology | Admitting: Hematology and Oncology

## 2023-08-03 VITALS — BP 147/64 | HR 63 | Temp 97.5°F | Resp 17 | Wt 169.4 lb

## 2023-08-03 DIAGNOSIS — Z923 Personal history of irradiation: Secondary | ICD-10-CM | POA: Diagnosis not present

## 2023-08-03 DIAGNOSIS — Z8589 Personal history of malignant neoplasm of other organs and systems: Secondary | ICD-10-CM | POA: Insufficient documentation

## 2023-08-03 DIAGNOSIS — C770 Secondary and unspecified malignant neoplasm of lymph nodes of head, face and neck: Secondary | ICD-10-CM | POA: Diagnosis not present

## 2023-08-03 DIAGNOSIS — Z8546 Personal history of malignant neoplasm of prostate: Secondary | ICD-10-CM | POA: Diagnosis not present

## 2023-08-03 DIAGNOSIS — Z9221 Personal history of antineoplastic chemotherapy: Secondary | ICD-10-CM | POA: Insufficient documentation

## 2023-08-03 NOTE — Telephone Encounter (Signed)
 Follow up call completed. No questions or concerns noted. Advised to call back with any questions or concerns.

## 2023-08-03 NOTE — Telephone Encounter (Signed)
  Follow up Call-     07/31/2023    1:21 PM  Call back number  Post procedure Call Back phone  # [phone]  Permission to leave phone message Yes     Patient questions:  Do you have a fever, pain , or abdominal swelling? No. Pain Score  0 *  Have you tolerated food without any problems? Yes.    Have you been able to return to your normal activities? Yes.    Do you have any questions about your discharge instructions: Diet   No. Medications  No. Follow up visit  No.  Do you have questions or concerns about your Care? No.  Actions: * If pain score is 4 or above: No action needed, pain <4.

## 2023-08-03 NOTE — Progress Notes (Signed)
 Hematology and Oncology Follow Up Visit  Nicholas Daniels [id number] 25-May-1950 73 y.o. 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    2.   Prostate cancer diagnosed in 2009.  He was found to have T3N0 Gleason score 3+4 = 7 and currently with biochemical relapse.  Prior Therapy:  He underwent prostatectomy and found to have a T3a N0 Gleason score 3+4 = 7 cancer.  He underwent salvage radiation in 2012.   He is status post direct laryngoscopy and left neck dissection completed by Dr. Donalee Fruits on December 17, 2020.  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 Adjuvant radiation therapy with weekly cisplatin  started on February 05, 2021.   He completed adjuvant radiation therapy with cisplatin  in November 2022.  Current therapy: Active surveillance  Interim History: Mr. Rajaram returns today for repeat evaluation.   Discussed the use of AI scribe software for clinical note transcription with the patient, who gave verbal consent to proceed.  History of Present Illness         The patient, with a history of prostate cancer and head and neck cancer, is currently on Xtandi, here for follow up. Discussed the use of AI scribe software for clinical note transcription with the patient, who gave verbal consent to proceed.  History of Present Illness Nicholas Daniels is a 74 year old male with prostate cancer and head and neck cancer who presents for routine follow-up.  He has been on Xtandi for prostate cancer since 2012, with undetectable PSA levels. He experiences urinary incontinence, which he attributes to treatment, and manages it with Depends.  He was diagnosed with head and neck cancer in September 2022. He completed radiation therapy but  could not finish the last two chemotherapy sessions due to intolerance. He is on warfarin for atrial fibrillation. Recent scopes and a PET scan in February 2024 showed no new disease or concerns.  He previously experienced a metallic taste in his mouth, which has resolved, allowing him to eat normally and gain weight. He reports fatigue, particularly in the evenings, feeling very sleepy by 8-9 PM. He sometimes takes afternoon naps but is often too busy to rest.  He was switched from omeprazole  to pantoprazole  twice daily after an endoscopy revealed throat burning. He is also taking B12 supplements.  His thyroid  function is monitored annually due to its exposure to radiation, with the last test in October 2024 showing good results. No swelling in his legs and normal bowel and urinary function aside from incontinence.    Medications: Updated on review. Current Outpatient Medications  Medication Sig Dispense Refill   latanoprost  (XALATAN ) 0.005 % ophthalmic solution Place 1 drop into both eyes at bedtime.     metoprolol  tartrate (LOPRESSOR ) 25 MG tablet TAKE 1 TABLET BY MOUTH TWICE A DAY 180 tablet 2   omeprazole  (PRILOSEC) 40 MG capsule Take 40 mg by mouth daily.     pantoprazole  (PROTONIX ) 40 MG tablet Take 1 tablet (40 mg total) by mouth 2 (two) times daily. 180 tablet 3   timolol  (TIMOPTIC ) 0.5 % ophthalmic solution Place 1 drop into both eyes daily.     warfarin (COUMADIN ) 5 MG tablet TAKE 1 TABLET BY MOUTH EVERY DAY OR AS DIRECTED BY COUMADIN  CLINIC 90 tablet 1   XTANDI 40  MG capsule Take 160 mg by mouth daily.     No current facility-administered medications for this visit.     Allergies: No Known Allergies   Physical Exam:  Blood pressure (!) 147/64, pulse 63, temperature (!) 97.5 F (36.4 C), temperature source Temporal, resp. rate 17, weight 169 lb 6.4 oz (76.8 kg), SpO2 100%.  ECOG: 1  General appearance: Alert, awake without any distress. Head: Atraumatic without  abnormalities Oropharynx: Without any thrush or ulcers. No tonsillar enlargement or masses noted. Eyes: No scleral icterus. Lymph nodes: No lymphadenopathy noted in the cervical, supraclavicular, or axillary nodes Lung: Clear to auscultation without any rhonchi, wheezes or dullness to percussion. Abdomin: Soft, nontender without any shifting dullness or ascites. Musculoskeletal: No clubbing or cyanosis. Neurological: No motor or sensory deficits. Skin: No rashes or lesions. Psychiatric: Mood and affect appeared normal.  Lab Results: Lab Results  Component Value Date   WBC 4.0 06/19/2023   HGB 14.2 06/19/2023   HCT 40.9 06/19/2023   MCV 97.8 06/19/2023   PLT 234.0 06/19/2023     Chemistry      Component Value Date/Time   NA 139 06/19/2023 1504   K 4.7 06/19/2023 1504   CL 104 06/19/2023 1504   CO2 27 06/19/2023 1504   BUN 15 06/19/2023 1504   CREATININE 0.88 06/19/2023 1504   CREATININE 0.93 02/02/2023 1432   CREATININE 1.10 02/13/2015 1545      Component Value Date/Time   CALCIUM  9.2 06/19/2023 1504   ALKPHOS 26 (L) 06/19/2023 1504   AST 15 06/19/2023 1504   AST 15 02/02/2023 1432   ALT 10 06/19/2023 1504   ALT 9 02/02/2023 1432   BILITOT 1.2 06/19/2023 1504   BILITOT 1.2 02/02/2023 1432     IMPRESSION: No signs of PSMA avid disease recurrence following prostatectomy.   Postoperative changes in the LEFT neck with diminished stranding in the subcutaneous fat. Otherwise grossly stable morphologic changes related to post treatment effect.   Mild bladder wall thickening and perivesical stranding, urinary bladder is under distended and findings are nonspecific. Correlate with any signs of cystitis.   Aortic atherosclerosis, calcified coronary artery disease cholecystectomy changes as described.   Assessment and Plan:    73 year old with:  Assessment and Plan Assessment & Plan Prostate cancer PSA undetectable, indicating effective control with Xtandi. 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 - Continue Xtandi indefinitely. FU with Dr Inga Manges. - Manage urinary incontinence with Depends.  Head and neck cancer No recurrence or new concerns post-treatment.  FU with Dr Donalee Fruits as scheduled.  Atrial fibrillation managed with warfarin. - Continue warfarin for atrial fibrillation.  At risk for hypothyroidism Annual TSH monitoring needed Last TSH Oct normal.  Fatigue Significant evening fatigue, possibly related to daily activities and treatment effects. - Consider afternoon nap to manage fatigue.   Murleen Arms, MD 4/28/202512:55 PM

## 2023-08-05 LAB — SURGICAL PATHOLOGY

## 2023-08-07 ENCOUNTER — Ambulatory Visit: Attending: Cardiology

## 2023-08-07 DIAGNOSIS — Z5181 Encounter for therapeutic drug level monitoring: Secondary | ICD-10-CM

## 2023-08-07 DIAGNOSIS — I4891 Unspecified atrial fibrillation: Secondary | ICD-10-CM | POA: Diagnosis not present

## 2023-08-07 LAB — POCT INR: INR: 1.5 — AB (ref 2.0–3.0)

## 2023-08-07 NOTE — Patient Instructions (Signed)
 Description   Take 1.5 tablets today and 1 tablets tomorrow and then continue taking warfarin 1/2 tablet (2.5mg ) daily except 1 tablet (5mg ) on Mondays, Wednesdays and Fridays.  Stay consistent with greens each week.  Recheck INR in 1 week.   Anticoagulation Clinic [phone]

## 2023-08-17 ENCOUNTER — Ambulatory Visit

## 2023-08-18 ENCOUNTER — Ambulatory Visit: Attending: Cardiology | Admitting: *Deleted

## 2023-08-18 DIAGNOSIS — Z5181 Encounter for therapeutic drug level monitoring: Secondary | ICD-10-CM | POA: Diagnosis not present

## 2023-08-18 DIAGNOSIS — I4891 Unspecified atrial fibrillation: Secondary | ICD-10-CM

## 2023-08-18 LAB — POCT INR: INR: 1.7 — AB (ref 2.0–3.0)

## 2023-08-18 NOTE — Patient Instructions (Signed)
 Description   Take 1 tablet today then START taking warfarin 1 tablet (5mg ) daily except 1/2 tablet (2.5mg ) on Tuesday, Thursday, and Saturday.  Stay consistent with greens each week.  Recheck INR in 2 weeks.  Anticoagulation Clinic [phone]

## 2023-08-24 DIAGNOSIS — Z961 Presence of intraocular lens: Secondary | ICD-10-CM | POA: Diagnosis not present

## 2023-08-24 DIAGNOSIS — H40013 Open angle with borderline findings, low risk, bilateral: Secondary | ICD-10-CM | POA: Diagnosis not present

## 2023-09-01 ENCOUNTER — Ambulatory Visit: Payer: Self-pay | Admitting: Gastroenterology

## 2023-09-02 ENCOUNTER — Other Ambulatory Visit (HOSPITAL_BASED_OUTPATIENT_CLINIC_OR_DEPARTMENT_OTHER): Payer: Self-pay | Admitting: Cardiology

## 2023-09-02 ENCOUNTER — Ambulatory Visit

## 2023-09-02 DIAGNOSIS — I4891 Unspecified atrial fibrillation: Secondary | ICD-10-CM

## 2023-09-02 NOTE — Telephone Encounter (Signed)
 Prescription refill request received for warfarin Lov: 06/03/23 Veryl Gottron)  Next INR check: 09/02/23 Warfarin tablet strength: 5mg   Appropriate dose. Refill sent.

## 2023-09-11 ENCOUNTER — Ambulatory Visit: Attending: Cardiology

## 2023-09-11 DIAGNOSIS — Z5181 Encounter for therapeutic drug level monitoring: Secondary | ICD-10-CM | POA: Diagnosis not present

## 2023-09-11 DIAGNOSIS — I4891 Unspecified atrial fibrillation: Secondary | ICD-10-CM

## 2023-09-11 LAB — POCT INR: INR: 1.6 — AB (ref 2.0–3.0)

## 2023-09-11 NOTE — Patient Instructions (Signed)
 Take 2 tablets today then continue taking warfarin 1 tablet (5mg ) daily except 1/2 tablet (2.5mg ) on Tuesday, Thursday, and Saturday.  Stay consistent with greens each week.  Recheck INR in 2 weeks.  Anticoagulation Clinic [phone]

## 2023-09-25 ENCOUNTER — Ambulatory Visit: Attending: Cardiology

## 2023-09-25 DIAGNOSIS — Z5181 Encounter for therapeutic drug level monitoring: Secondary | ICD-10-CM | POA: Diagnosis not present

## 2023-09-25 DIAGNOSIS — I4891 Unspecified atrial fibrillation: Secondary | ICD-10-CM | POA: Diagnosis not present

## 2023-09-25 LAB — POCT INR: INR: 1.9 — AB (ref 2.0–3.0)

## 2023-09-25 NOTE — Progress Notes (Cosign Needed)
Please see anticoagulation encounter.

## 2023-09-25 NOTE — Patient Instructions (Signed)
 Increase to 1 tablet Daily 5mg , except 0.5 tablet every Wednesday 2.5mg . Stay consistent with greens each week.  Recheck INR in 3 weeks.  Anticoagulation Clinic [phone]

## 2023-10-01 DIAGNOSIS — C109 Malignant neoplasm of oropharynx, unspecified: Secondary | ICD-10-CM | POA: Diagnosis not present

## 2023-10-01 DIAGNOSIS — Z923 Personal history of irradiation: Secondary | ICD-10-CM | POA: Diagnosis not present

## 2023-10-16 ENCOUNTER — Ambulatory Visit: Attending: Cardiology

## 2023-10-16 DIAGNOSIS — I4891 Unspecified atrial fibrillation: Secondary | ICD-10-CM

## 2023-10-16 DIAGNOSIS — Z5181 Encounter for therapeutic drug level monitoring: Secondary | ICD-10-CM | POA: Diagnosis not present

## 2023-10-16 LAB — POCT INR: INR: 2.2 (ref 2.0–3.0)

## 2023-10-16 NOTE — Patient Instructions (Signed)
 Description   Continue taking 1 tablet daily (5mg ), except 0.5 tablet every Wednesday(2.5mg ). Stay consistent with greens each week.  Recheck INR in 4 weeks.  Anticoagulation Clinic [phone]

## 2023-10-16 NOTE — Progress Notes (Signed)
Please see anticoagulation encounter.

## 2023-11-13 ENCOUNTER — Ambulatory Visit: Attending: Cardiology

## 2023-11-13 DIAGNOSIS — Z5181 Encounter for therapeutic drug level monitoring: Secondary | ICD-10-CM | POA: Diagnosis not present

## 2023-11-13 DIAGNOSIS — I4891 Unspecified atrial fibrillation: Secondary | ICD-10-CM | POA: Diagnosis not present

## 2023-11-13 LAB — POCT INR: INR: 2.9 (ref 2.0–3.0)

## 2023-11-13 NOTE — Progress Notes (Signed)
 INR 2.9  Please see anticoagulation encounter

## 2023-11-13 NOTE — Patient Instructions (Signed)
 Description   Continue taking 1 tablet daily (5mg ), except 0.5 tablet every Wednesday(2.5mg ). Stay consistent with greens each week.  Recheck INR in 5 weeks.  Anticoagulation Clinic [phone]

## 2023-11-16 ENCOUNTER — Telehealth: Payer: Self-pay | Admitting: Cardiology

## 2023-11-16 DIAGNOSIS — R55 Syncope and collapse: Secondary | ICD-10-CM

## 2023-11-16 DIAGNOSIS — R42 Dizziness and giddiness: Secondary | ICD-10-CM

## 2023-11-16 NOTE — Telephone Encounter (Signed)
 Spoke with patient and his wife.  His symptoms have been occurring 1-2 times per week since he was last seen but over the last 2 weeks have increased in frequency.  Today around 1 pm the symptoms started and were on and off until 3pm.  Now have subsided.  The duration from onset to completion is 10 sec. This has happened 4-5 times today.  He describes as funny feeling in his head like squeezing), vision feels like getting dark, and mild SOB.  He was playing video games today and sitting when the episodes occurred.  Did not have LOC. BP has been good: 104/76 HR 76 today around 3 and at 2 am was 119/70 HR 69. When asking more questions regarding LOC his wife states that on 7/31 while attending the grand opening of [employer], he had been waiting outside in heat and once he was inside and placing his order he passed out and hit his head on register.  Wife states once she got him home his BP was low and HR was 99.  She can not remember the exact BP reading. He did not seek treatment at that time.    Discussed above with Reche Finder, NP who recommended a  14 day ZIO-AT (live monitor) and carotid duplex with follow up with Dr. Lonni or APP in 4-6 weeks.   Called patient and wife back.  Let them know plans and recommendations and that I have placed order for test and will have someone call them with a follow-up appointment with Reche Finder, NP in Sept.  If the testing reviles he needs to be seen sooner will move up his appointment.  They thanked me for the call.

## 2023-11-16 NOTE — Telephone Encounter (Signed)
 Discussed with Burnard, RN. Recommend 14 day ZIO-AT (live monitor) and carotid duplex with follow up with Dr. Lonni or APP in 4-6 weeks.   Nicholas Toutant S Yuliana Vandrunen, NP

## 2023-11-16 NOTE — Telephone Encounter (Signed)
 Pt c/o Syncope: STAT if syncope occurred within 24 hours and pt complains of lightheadedness  Did you pass out today? No   When is the last time you passed out? 7/31   Has this occurred multiple times? Yes   Did you have any symptoms prior to passing out? Unable to describe symptoms - pt just feels weird   5. Did you fall? If so, are you on a blood thinner? On Coumadin 

## 2023-11-17 ENCOUNTER — Ambulatory Visit: Attending: Family

## 2023-11-17 DIAGNOSIS — R55 Syncope and collapse: Secondary | ICD-10-CM

## 2023-11-17 DIAGNOSIS — R42 Dizziness and giddiness: Secondary | ICD-10-CM

## 2023-11-17 NOTE — Progress Notes (Unsigned)
Enrolled for [employer] to mail a ZIO AT Live Telemetry monitor to patients address on file.   Dr. Bridgette Christopher to read. 

## 2023-11-18 ENCOUNTER — Ambulatory Visit (HOSPITAL_COMMUNITY)
Admission: RE | Admit: 2023-11-18 | Discharge: 2023-11-18 | Disposition: A | Discharge status: Home / Self Care | Source: Ambulatory Visit | Attending: Family | Admitting: Family

## 2023-11-18 DIAGNOSIS — R42 Dizziness and giddiness: Secondary | ICD-10-CM | POA: Diagnosis not present

## 2023-11-18 DIAGNOSIS — R55 Syncope and collapse: Secondary | ICD-10-CM | POA: Insufficient documentation

## 2023-11-19 ENCOUNTER — Ambulatory Visit (HOSPITAL_BASED_OUTPATIENT_CLINIC_OR_DEPARTMENT_OTHER): Payer: Self-pay | Admitting: Family

## 2023-11-20 DIAGNOSIS — R42 Dizziness and giddiness: Secondary | ICD-10-CM | POA: Diagnosis not present

## 2023-11-20 DIAGNOSIS — R55 Syncope and collapse: Secondary | ICD-10-CM | POA: Diagnosis not present

## 2023-11-27 ENCOUNTER — Telehealth: Payer: Self-pay | Admitting: Cardiology

## 2023-11-27 NOTE — Telephone Encounter (Signed)
 Received notification that patient had critical alert- Atrial fibrillation rate 70-120 with average HR 98 lasting 98 seconds  Event occurred 3:43 pm EST  Patient currently taking Warfarin    Will forward to Reche ORN NP for review

## 2023-11-27 NOTE — Telephone Encounter (Signed)
 [employer] calling to report urgent results.

## 2023-11-27 NOTE — Telephone Encounter (Signed)
 Known atrial fib, already appropriately anticoagulated with Wafarin. Not triggered episode per ZIO suite. No indication for changes at this time, was auto-alert for first instance of atrial fib per protocol.   Eleanor Gatliff S Dawsen Krieger, NP

## 2023-12-03 ENCOUNTER — Other Ambulatory Visit: Payer: PPO

## 2023-12-03 ENCOUNTER — Ambulatory Visit (HOSPITAL_BASED_OUTPATIENT_CLINIC_OR_DEPARTMENT_OTHER)
Admission: RE | Admit: 2023-12-03 | Discharge: 2023-12-03 | Disposition: A | Discharge status: Home / Self Care | Source: Ambulatory Visit | Attending: Internal Medicine | Admitting: Internal Medicine

## 2023-12-03 DIAGNOSIS — M85852 Other specified disorders of bone density and structure, left thigh: Secondary | ICD-10-CM | POA: Diagnosis not present

## 2023-12-03 DIAGNOSIS — M8589 Other specified disorders of bone density and structure, multiple sites: Secondary | ICD-10-CM | POA: Diagnosis not present

## 2023-12-03 DIAGNOSIS — Z13820 Encounter for screening for osteoporosis: Secondary | ICD-10-CM | POA: Diagnosis not present

## 2023-12-03 DIAGNOSIS — M85851 Other specified disorders of bone density and structure, right thigh: Secondary | ICD-10-CM | POA: Diagnosis not present

## 2023-12-18 ENCOUNTER — Ambulatory Visit: Attending: Cardiology | Admitting: *Deleted

## 2023-12-18 DIAGNOSIS — Z5181 Encounter for therapeutic drug level monitoring: Secondary | ICD-10-CM

## 2023-12-18 DIAGNOSIS — I4891 Unspecified atrial fibrillation: Secondary | ICD-10-CM | POA: Diagnosis not present

## 2023-12-18 LAB — POCT INR: INR: 2.4 (ref 2.0–3.0)

## 2023-12-18 NOTE — Progress Notes (Signed)
 Description   INR-2.4; Continue taking 1 tablet daily (5mg ), except 0.5 tablet every Wednesday(2.5mg ). Stay consistent with greens each week.  Recheck INR in 6 weeks.  Anticoagulation Clinic [phone]

## 2023-12-18 NOTE — Patient Instructions (Signed)
 Description   INR-2.4; Continue taking 1 tablet daily (5mg ), except 0.5 tablet every Wednesday(2.5mg ). Stay consistent with greens each week.  Recheck INR in 6 weeks.  Anticoagulation Clinic [phone]

## 2023-12-21 DIAGNOSIS — H35371 Puckering of macula, right eye: Secondary | ICD-10-CM | POA: Diagnosis not present

## 2023-12-21 DIAGNOSIS — H43813 Vitreous degeneration, bilateral: Secondary | ICD-10-CM | POA: Diagnosis not present

## 2023-12-21 DIAGNOSIS — H33192 Other retinoschisis and retinal cysts, left eye: Secondary | ICD-10-CM | POA: Diagnosis not present

## 2023-12-21 DIAGNOSIS — H35372 Puckering of macula, left eye: Secondary | ICD-10-CM | POA: Diagnosis not present

## 2024-01-01 ENCOUNTER — Encounter (HOSPITAL_BASED_OUTPATIENT_CLINIC_OR_DEPARTMENT_OTHER): Payer: Self-pay | Admitting: Family

## 2024-01-01 ENCOUNTER — Ambulatory Visit (HOSPITAL_BASED_OUTPATIENT_CLINIC_OR_DEPARTMENT_OTHER): Admitting: Family

## 2024-01-01 VITALS — BP 120/72 | HR 59 | Resp 17 | Ht 67.0 in | Wt 167.0 lb

## 2024-01-01 DIAGNOSIS — I2510 Atherosclerotic heart disease of native coronary artery without angina pectoris: Secondary | ICD-10-CM | POA: Diagnosis not present

## 2024-01-01 DIAGNOSIS — R0609 Other forms of dyspnea: Secondary | ICD-10-CM | POA: Diagnosis not present

## 2024-01-01 DIAGNOSIS — R072 Precordial pain: Secondary | ICD-10-CM | POA: Diagnosis not present

## 2024-01-01 DIAGNOSIS — R079 Chest pain, unspecified: Secondary | ICD-10-CM | POA: Diagnosis not present

## 2024-01-01 DIAGNOSIS — Z87898 Personal history of other specified conditions: Secondary | ICD-10-CM

## 2024-01-01 DIAGNOSIS — E785 Hyperlipidemia, unspecified: Secondary | ICD-10-CM | POA: Diagnosis not present

## 2024-01-01 LAB — BASIC METABOLIC PANEL WITH GFR
BUN/Creatinine Ratio: 13 (ref 10–24)
BUN: 13 mg/dL (ref 8–27)
CO2: 23 mmol/L (ref 20–29)
Calcium: 9 mg/dL (ref 8.6–10.2)
Chloride: 102 mmol/L (ref 96–106)
Creatinine, Ser: 1 mg/dL (ref 0.76–1.27)
Glucose: 86 mg/dL (ref 70–99)
Potassium: 4.6 mmol/L (ref 3.5–5.2)
Sodium: 140 mmol/L (ref 134–144)
eGFR: 79 mL/min/1.73 (ref >59)

## 2024-01-01 NOTE — Patient Instructions (Signed)
 Medication Instructions:   Your physician recommends that you continue on your current medications as directed. Please refer to the Current Medication list given to you today.  *If you need a refill on your cardiac medications before your next appointment, please call your pharmacy*  Lab Work:  TODAY--ON THE 3RD FLOOR OF THIS BUILDING AT [address] AT [facility]  If you have labs (blood work) drawn today and your tests are completely normal, you will receive your results only by: MyChart Message (if you have MyChart) OR A paper copy in the mail If you have any lab test that is abnormal or we need to change your treatment, we will call you to review the results.   Testing/Procedures:  Your physician has requested that you have an echocardiogram. Echocardiography is a painless test that uses sound waves to create images of your heart. It provides your doctor with information about the size and shape of your heart and how well your heart's chambers and valves are working. This procedure takes approximately one hour. There are no restrictions for this procedure. Please do NOT wear cologne, perfume, aftershave, or lotions (deodorant is allowed). Please arrive 15 minutes prior to your appointment time.  Please note: We ask at that you not bring children with you during ultrasound (echo/ vascular) testing. Due to room size and safety concerns, children are not allowed in the ultrasound rooms during exams. Our front office staff cannot provide observation of children in our lobby area while testing is being conducted. An adult accompanying a patient to their appointment will only be allowed in the ultrasound room at the discretion of the ultrasound technician under special circumstances. We apologize for any inconvenience.      Your cardiac CT will be scheduled at one of the below locations:    [facility] [address]  [place], [place] [address] [phone]    If scheduled at the Heart and Vascular Tower at [facility] street, please enter the parking lot using the [address] entrance and use the FREE valet service at the patient drop-off area. Enter the building and check-in with registration on the main floor.    Please follow these instructions carefully (unless otherwise directed):  An IV will be required for this test and Nitroglycerin  will be given.  Hold all erectile dysfunction medications at least 3 days (72 hrs) prior to test. (Ie viagra, cialis, sildenafil, tadalafil, etc)   On the Night Before the Test: Be sure to Drink plenty of water . Do not consume any caffeinated/decaffeinated beverages or chocolate 12 hours prior to your test. Do not take any antihistamines 12 hours prior to your test.  On the Day of the Test: Drink plenty of water  until 1 hour prior to the test. Do not eat any food 1 hour prior to test. You may take your regular medications prior to the test.  Take metoprolol  25 MG  (Lopressor ) BY MOUTH two hours prior to test.  YOU MAY TAKE YOUR REGULAR SCHEDULED DOSE THAT EVENING       After the Test: Drink plenty of water . After receiving IV contrast, you may experience a mild flushed feeling. This is normal. On occasion, you may experience a mild rash up to 24 hours after the test. This is not dangerous. If this occurs, you can take Benadryl  25 mg, Zyrtec, Claritin, or Allegra and increase your fluid intake. (Patients taking Tikosyn should avoid Benadryl , and may take Zyrtec, Claritin, or Allegra) If you  experience trouble breathing, this can be serious. If it is severe call 911 IMMEDIATELY. If it is mild, please call our office.  We will call to schedule your test 2-4 weeks out understanding that some insurance companies will need an authorization prior to the service being performed.   For more information and frequently asked questions, please visit our website :  [email or web address]  For non-scheduling related questions, please contact the cardiac imaging nurse navigator should you have any questions/concerns: Cardiac Imaging Nurse Navigators Direct Office Dial: [phone]   For scheduling needs, including cancellations and rescheduling, please call [place], [phone].    Follow-Up:  3 MONTHS WITH DR. LONNI OR CAITLIN WALKER, NP

## 2024-01-01 NOTE — Progress Notes (Signed)
 Cardiology Office Note   Date:  01/01/2024  ID:  Jermey, Closs 07-11-1950, MRN [id number] PCP: Yolande Toribio MATSU, MD  [facility] Providers Cardiologist:  Shelda Bruckner, MD     History of Present Illness BOGDAN VIVONA is a 73 y.o. 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   Seen by Dr. Bruckner 05/08/2022 in rate controlled atrial fibrillation.  He opted to proceed with rate control for the time being and was given options such as amiodarone or ablation.  He was last seen 05/14/23 with episodic lightheadedness, recommended for monitoring and to ensure eating/drinking well. He was tolerating oral intake with PEG tube removed. Unable to take DOAC due to being on Xtandi. He declined rhythm control of atrial fibrillation, he was in NSR at that clinic visit.  He contacted the office 11/16/23 noting funny feeling in his head like squeezing and LOC after waiting outside in the heat with hypotension, did not go to ED. Subsequent evaluation: Carotid 11/18/23 with no stenosis. Monitor 11/2023 preliminary report with average HR 62 bpm. He had 2 runs of VT longest 7 beats at 105 bpm. Atrial fibrillation burden of 9% with average rate 98 bpm. Atrial fibrillation detected within +/- 45 sec of triggered events. Longest episode of atrial fibrillation 3h 20 min. No significant bradycardia nor pause.    Presents today for follow-up with his wife. Notes last week was not good for him with fatigue, dyspnea on exertion, daily chest/ upper abdomen pressure. This limited his normal activities, such as walking in [facility], requiring frequent breaks. He has noticed increasing similar episodes over last few months. This week he has felt fine. He does not experience palpitations. No recurrent syncope however describes pressure inside that gets bigger and an exploding sensation that has occurred recently in his abdomen.  He does not monitor BP or heart rate at home. He denies weight gain, edema. Denies bleeding on warfarin.   He has a family history of CAD - brother with MI in 9s; mother diagnosed in 93s with CABG in 6s.  He works on Wednesdays at the [employer].  Appetite is fair/good. Eats 2.5 meals daily. Drinks water  and diet, caffeine-free soda. Present symptoms do not occur after eating.   ROS: Please see the history of present illness.    All other systems reviewed and are negative.   Studies Reviewed      Cardiac Studies & Procedures   ______________________________________________________________________________________________     ECHOCARDIOGRAM  ECHOCARDIOGRAM COMPLETE 03/07/2021  Narrative ECHOCARDIOGRAM REPORT    Patient Name:   CANDLER GINSBERG [facility] Date of Exam: 03/07/2021 Medical Rec #:  [id number]       Height:       67.0 in Accession #:    [id number]      Weight:       198.2 lb Date of Birth:  1950-10-30      BSA:          2.014 m Patient Age:    70 years        BP:           111/73 mmHg Patient Gender: M               HR:           113 bpm. Exam Location:  Inpatient  Procedure: 2D Echo  Indications:    atrial fibrillation  History:  Patient has no prior history of Echocardiogram examinations. Risk Factors:Hypertension.  Sonographer:    Tinnie Barefoot RDCS Referring Phys: [id number] DENECE A KYLE  IMPRESSIONS   1. Left ventricular ejection fraction, by estimation, is 55 to 60%. The left ventricle has normal function. The left ventricle has no regional wall motion abnormalities. Left ventricular diastolic function could not be evaluated. 2. Right ventricular systolic function is normal. The right ventricular size is normal. There is moderately elevated pulmonary artery systolic pressure. 3. The mitral valve is normal in structure. Mild mitral valve regurgitation. No evidence of mitral stenosis. 4. The aortic valve is tricuspid. Aortic valve  regurgitation is not visualized. No aortic stenosis is present. 5. The inferior vena cava is dilated in size with <50% respiratory variability, suggesting right atrial pressure of 15 mmHg.  Comparison(s): No prior Echocardiogram.  FINDINGS Left Ventricle: Left ventricular ejection fraction, by estimation, is 55 to 60%. The left ventricle has normal function. The left ventricle has no regional wall motion abnormalities. The left ventricular internal cavity size was normal in size. There is no left ventricular hypertrophy. Left ventricular diastolic function could not be evaluated due to atrial fibrillation. Left ventricular diastolic function could not be evaluated.  Right Ventricle: The right ventricular size is normal. Right ventricular systolic function is normal. There is moderately elevated pulmonary artery systolic pressure. The tricuspid regurgitant velocity is 3.16 m/s, and with an assumed right atrial pressure of 15 mmHg, the estimated right ventricular systolic pressure is 54.9 mmHg.  Left Atrium: Left atrial size was normal in size.  Right Atrium: Right atrial size was normal in size.  Pericardium: There is no evidence of pericardial effusion.  Mitral Valve: The mitral valve is normal in structure. Mild mitral valve regurgitation. No evidence of mitral valve stenosis.  Tricuspid Valve: The tricuspid valve is normal in structure. Tricuspid valve regurgitation is trivial. No evidence of tricuspid stenosis.  Aortic Valve: The aortic valve is tricuspid. Aortic valve regurgitation is not visualized. No aortic stenosis is present.  Pulmonic Valve: The pulmonic valve was normal in structure. Pulmonic valve regurgitation is trivial. No evidence of pulmonic stenosis.  Aorta: The aortic root is normal in size and structure.  Venous: The inferior vena cava is dilated in size with less than 50% respiratory variability, suggesting right atrial pressure of 15 mmHg.  IAS/Shunts: No atrial  level shunt detected by color flow Doppler.   LEFT VENTRICLE PLAX 2D LVIDd:         4.10 cm LVIDs:         2.60 cm LV PW:         1.10 cm LV IVS:        0.90 cm LVOT diam:     1.90 cm LVOT Area:     2.84 cm   IVC IVC diam: 2.50 cm  LEFT ATRIUM           Index        RIGHT ATRIUM           Index LA diam:      3.70 cm 1.84 cm/m   RA Area:     14.90 cm LA Vol (A2C): 57.4 ml 28.49 ml/m  RA Volume:   36.10 ml  17.92 ml/m  AORTA Ao Root diam: 3.10 cm Ao Asc diam:  2.90 cm  TRICUSPID VALVE TR Peak grad:   39.9 mmHg TR Vmax:        316.00 cm/s  SHUNTS Systemic Diam: 1.90 cm  Redell Shallow  MD Electronically signed by Redell Shallow MD Signature Date/Time: 03/07/2021/4:45:29 PM    Final          ______________________________________________________________________________________________      Risk Assessment/Calculations  CHA2DS2-VASc Score = 3   This indicates a 3.2% annual risk of stroke. The patient's score is based upon: CHF History: 0 HTN History: 1 Diabetes History: 0 Stroke History: 0 Vascular Disease History: 1 Age Score: 1 Gender Score: 0            Physical Exam VS:  BP 120/72 (BP Location: Left Arm, Patient Position: Sitting, Cuff Size: Normal)   Pulse (!) 59   Resp 17   Ht 5' 7 (1.702 m)   Wt 167 lb (75.8 kg)   SpO2 97%   BMI 26.16 kg/m        Wt Readings from Last 3 Encounters:  01/01/24 167 lb (75.8 kg)  08/03/23 169 lb 6.4 oz (76.8 kg)  07/31/23 169 lb (76.7 kg)    GEN: Well nourished, well developed in no acute distress NECK: No JVD; No carotid bruits CARDIAC: RRR, no murmurs, rubs, gallops; radial and PT pulses 2+ RESPIRATORY:  Clear to auscultation without rales, wheezing or rhonchi  ABDOMEN: Soft, non-tender, non-distended EXTREMITIES:  No edema; No deformity   ASSESSMENT AND PLAN  DOE / Chest pressure / Coronary calcification on CT - Recent symptoms of fatigue, DOE, chest pressure. Symptoms with mixed typical and  atypical presentation for angina. More notable last week and improved this week. Occur both at rest or with exertion. Mid LAD calcification on CT in 2016. 04/2023 LDL 124. Family history of premature CAD.  Will update echocardiogram in setting of syncope, DOE, chest pressure Ischemia evaluation with coronary CTA ordered; no additional BB indicated for pre-med; update BMET Readdress lipid control at follow up, not discussed today If cardiac testing normal, consider GI referral   Persistent atrial fibrillation / Hypercoagulable state - asymptomatic afib, without bleeding complications; RRR by auscultation today. Monitor 12/15/23 with 9% burden. Previously declined rhythm control. Continue warfarin per anticoag clinic. Denies bleeding complications. Continue metoprolol  tartrate 25mg  BID  Hx of syncope - isolated episode, suspect related to hypotension, heat exhaustion; normal carotid doppler 11/2023; preliminary monitor report reassuring without significant bradycardia, sustained NSVT, or pauses; 03/2021 echo with LVEF 55-60%, mild MR. Has history of episodic lightheadedness with possible presyncope symptoms. Physical exam today is reassuring. Will update echocardiogram in setting of syncope, DOE, chest pressure Advised adequate hydration  Return precautions discussed if recurrent syncope     Dispo: 3 months  Signed, Reche GORMAN Finder, NP

## 2024-01-04 ENCOUNTER — Ambulatory Visit (HOSPITAL_BASED_OUTPATIENT_CLINIC_OR_DEPARTMENT_OTHER): Payer: Self-pay | Admitting: Family

## 2024-01-04 DIAGNOSIS — I2510 Atherosclerotic heart disease of native coronary artery without angina pectoris: Secondary | ICD-10-CM

## 2024-01-04 DIAGNOSIS — E785 Hyperlipidemia, unspecified: Secondary | ICD-10-CM

## 2024-01-04 DIAGNOSIS — Z79899 Other long term (current) drug therapy: Secondary | ICD-10-CM

## 2024-01-08 ENCOUNTER — Telehealth (HOSPITAL_COMMUNITY): Payer: Self-pay | Admitting: *Deleted

## 2024-01-08 ENCOUNTER — Other Ambulatory Visit (HOSPITAL_BASED_OUTPATIENT_CLINIC_OR_DEPARTMENT_OTHER): Payer: Self-pay | Admitting: Cardiology

## 2024-01-08 DIAGNOSIS — I4819 Other persistent atrial fibrillation: Secondary | ICD-10-CM

## 2024-01-08 NOTE — Telephone Encounter (Signed)
 Reaching out to patient to offer assistance regarding upcoming cardiac imaging study; pt verbalizes understanding of appt date/time, parking situation and where to check in, pre-test NPO status, and verified current allergies; name and call back number provided for further questions should they arise  Larey Brick RN Navigator Cardiac Imaging [facility] [phone] office [phone] cell

## 2024-01-11 ENCOUNTER — Ambulatory Visit (HOSPITAL_COMMUNITY)
Admission: RE | Admit: 2024-01-11 | Discharge: 2024-01-11 | Disposition: A | Source: Ambulatory Visit | Attending: Family

## 2024-01-11 DIAGNOSIS — I2510 Atherosclerotic heart disease of native coronary artery without angina pectoris: Secondary | ICD-10-CM | POA: Diagnosis not present

## 2024-01-11 DIAGNOSIS — Q2112 Patent foramen ovale: Secondary | ICD-10-CM | POA: Diagnosis not present

## 2024-01-11 DIAGNOSIS — R072 Precordial pain: Secondary | ICD-10-CM

## 2024-01-11 DIAGNOSIS — I209 Angina pectoris, unspecified: Secondary | ICD-10-CM | POA: Diagnosis not present

## 2024-01-11 MED ORDER — IOHEXOL 350 MG/ML SOLN
100.0000 mL | Freq: Once | INTRAVENOUS | Status: AC | PRN
  Administered 2024-01-11: 100 mL via INTRAVENOUS

## 2024-01-11 MED ORDER — NITROGLYCERIN 0.4 MG SL SUBL
0.8000 mg | SUBLINGUAL_TABLET | Freq: Once | SUBLINGUAL | Status: AC
  Administered 2024-01-11: 0.8 mg via SUBLINGUAL

## 2024-01-12 ENCOUNTER — Other Ambulatory Visit: Payer: Self-pay | Admitting: Cardiology

## 2024-01-12 ENCOUNTER — Ambulatory Visit (HOSPITAL_COMMUNITY)
Admission: RE | Admit: 2024-01-12 | Discharge: 2024-01-12 | Disposition: A | Discharge status: Home / Self Care | Source: Ambulatory Visit | Attending: Cardiology | Admitting: Cardiology

## 2024-01-12 DIAGNOSIS — I2510 Atherosclerotic heart disease of native coronary artery without angina pectoris: Secondary | ICD-10-CM | POA: Insufficient documentation

## 2024-01-12 DIAGNOSIS — R931 Abnormal findings on diagnostic imaging of heart and coronary circulation: Secondary | ICD-10-CM

## 2024-01-13 DIAGNOSIS — R55 Syncope and collapse: Secondary | ICD-10-CM | POA: Diagnosis not present

## 2024-01-13 DIAGNOSIS — R42 Dizziness and giddiness: Secondary | ICD-10-CM

## 2024-01-14 MED ORDER — ROSUVASTATIN CALCIUM 20 MG PO TABS: 20.0000 mg | ORAL_TABLET | Freq: Every day | ORAL | 1 refills | Status: AC

## 2024-01-14 NOTE — Telephone Encounter (Signed)
-----   Message from Reche GORMAN Finder sent at 01/14/2024  1:31 PM EDT ----- Calcium  score of 351.  Overall moderate stenosis.  Not enough to cause symptoms but enough to focus on prevention.  Recommend rosuvastatin 20mg  daily with repeat FLP/ALT in 2-3 months.  ----- Message ----- From: Interface, Rad Results In Sent: 01/12/2024   2:20 PM EDT To: Reche GORMAN Finder, NP

## 2024-01-14 NOTE — Telephone Encounter (Signed)
 The patient has been notified of the result and verbalized understanding.  All questions (if any) were answered.  Pt aware we will start him on crestor 20 mg po daily and have him come in for repeat Lipids and ALT in 2-3 months.    Confirmed the pharmacy of choice with the pt.   Pt aware to come into our lab on the 3rd floor in 2-3 months (around 12/9 to 04/15/24) for repeat lipids/ALT.  Pt aware to come fasting to this lab appointment.   Pt verbalized understanding and agrees with this plan.

## 2024-01-29 ENCOUNTER — Other Ambulatory Visit (HOSPITAL_BASED_OUTPATIENT_CLINIC_OR_DEPARTMENT_OTHER)

## 2024-01-29 ENCOUNTER — Ambulatory Visit: Attending: Cardiology | Admitting: *Deleted

## 2024-01-29 DIAGNOSIS — I2510 Atherosclerotic heart disease of native coronary artery without angina pectoris: Secondary | ICD-10-CM

## 2024-01-29 DIAGNOSIS — I4891 Unspecified atrial fibrillation: Secondary | ICD-10-CM

## 2024-01-29 DIAGNOSIS — R0609 Other forms of dyspnea: Secondary | ICD-10-CM

## 2024-01-29 DIAGNOSIS — R079 Chest pain, unspecified: Secondary | ICD-10-CM | POA: Diagnosis not present

## 2024-01-29 DIAGNOSIS — Z5181 Encounter for therapeutic drug level monitoring: Secondary | ICD-10-CM

## 2024-01-29 LAB — POCT INR: INR: 3.1 — AB (ref 2.0–3.0)

## 2024-01-29 LAB — ECHOCARDIOGRAM COMPLETE
Area-P 1/2: 4.89 cm2
MV M vel: 4.86 m/s
MV Peak grad: 94.5 mmHg
Radius: 0.5 cm
S' Lateral: 2.87 cm

## 2024-01-29 NOTE — Patient Instructions (Signed)
 Description   INR-3.1; Today take 1/2 tablet of warfarin then continue taking 1 tablet daily (5mg ), except 1/2 tablet every Wednesday (2.5mg ). Stay consistent with greens each week.  Recheck INR in 6 weeks.  Anticoagulation Clinic [phone]

## 2024-01-29 NOTE — Progress Notes (Signed)
 Description   INR-3.1; Today take 1/2 tablet of warfarin then continue taking 1 tablet daily (5mg ), except 1/2 tablet every Wednesday (2.5mg ). Stay consistent with greens each week.  Recheck INR in 6 weeks.  Anticoagulation Clinic [phone]

## 2024-02-02 DIAGNOSIS — C770 Secondary and unspecified malignant neoplasm of lymph nodes of head, face and neck: Secondary | ICD-10-CM | POA: Diagnosis not present

## 2024-02-02 DIAGNOSIS — Z923 Personal history of irradiation: Secondary | ICD-10-CM | POA: Diagnosis not present

## 2024-02-10 DIAGNOSIS — H35373 Puckering of macula, bilateral: Secondary | ICD-10-CM | POA: Diagnosis not present

## 2024-02-10 DIAGNOSIS — H0102B Squamous blepharitis left eye, upper and lower eyelids: Secondary | ICD-10-CM | POA: Diagnosis not present

## 2024-02-10 DIAGNOSIS — H0102A Squamous blepharitis right eye, upper and lower eyelids: Secondary | ICD-10-CM | POA: Diagnosis not present

## 2024-02-10 DIAGNOSIS — H04123 Dry eye syndrome of bilateral lacrimal glands: Secondary | ICD-10-CM | POA: Diagnosis not present

## 2024-02-10 DIAGNOSIS — H40013 Open angle with borderline findings, low risk, bilateral: Secondary | ICD-10-CM | POA: Diagnosis not present

## 2024-02-22 ENCOUNTER — Other Ambulatory Visit (HOSPITAL_BASED_OUTPATIENT_CLINIC_OR_DEPARTMENT_OTHER): Payer: Self-pay | Admitting: Cardiology

## 2024-02-22 DIAGNOSIS — I4891 Unspecified atrial fibrillation: Secondary | ICD-10-CM

## 2024-03-11 ENCOUNTER — Ambulatory Visit: Attending: Cardiology | Admitting: *Deleted

## 2024-03-11 DIAGNOSIS — I4891 Unspecified atrial fibrillation: Secondary | ICD-10-CM | POA: Diagnosis not present

## 2024-03-11 DIAGNOSIS — Z5181 Encounter for therapeutic drug level monitoring: Secondary | ICD-10-CM

## 2024-03-11 LAB — POCT INR: INR: 2.8 (ref 2.0–3.0)

## 2024-03-11 NOTE — Progress Notes (Signed)
 Description   INR-2.8; Continue taking 1 tablet daily (5mg ), except 1/2 tablet every Wednesday (2.5mg ). Stay consistent with greens each week.  Recheck INR in 6 weeks.  Anticoagulation Clinic [phone]

## 2024-03-11 NOTE — Patient Instructions (Signed)
 Description   INR-2.8; Continue taking 1 tablet daily (5mg ), except 1/2 tablet every Wednesday (2.5mg ). Stay consistent with greens each week.  Recheck INR in 6 weeks.  Anticoagulation Clinic [phone]

## 2024-03-15 DIAGNOSIS — E785 Hyperlipidemia, unspecified: Secondary | ICD-10-CM | POA: Diagnosis not present

## 2024-03-15 DIAGNOSIS — I2510 Atherosclerotic heart disease of native coronary artery without angina pectoris: Secondary | ICD-10-CM | POA: Diagnosis not present

## 2024-03-15 LAB — LIPID PANEL
Chol/HDL Ratio: 2.5 ratio (ref 0.0–5.0)
Cholesterol, Total: 140 mg/dL (ref 100–199)
HDL: 56 mg/dL (ref >39)
LDL Chol Calc (NIH): 65 mg/dL (ref 0–99)
Triglycerides: 103 mg/dL (ref 0–149)
VLDL Cholesterol Cal: 19 mg/dL (ref 5–40)

## 2024-03-15 LAB — ALT: ALT: 15 IU/L (ref 0–44)

## 2024-03-17 DIAGNOSIS — N3946 Mixed incontinence: Secondary | ICD-10-CM | POA: Diagnosis not present

## 2024-03-17 DIAGNOSIS — N62 Hypertrophy of breast: Secondary | ICD-10-CM | POA: Diagnosis not present

## 2024-03-17 DIAGNOSIS — C61 Malignant neoplasm of prostate: Secondary | ICD-10-CM | POA: Diagnosis not present

## 2024-03-25 ENCOUNTER — Encounter (HOSPITAL_BASED_OUTPATIENT_CLINIC_OR_DEPARTMENT_OTHER): Payer: Self-pay | Admitting: Family

## 2024-03-25 ENCOUNTER — Ambulatory Visit (HOSPITAL_BASED_OUTPATIENT_CLINIC_OR_DEPARTMENT_OTHER): Admitting: Family

## 2024-03-25 VITALS — BP 104/56 | HR 57 | Ht 67.0 in | Wt 170.3 lb

## 2024-03-25 DIAGNOSIS — E785 Hyperlipidemia, unspecified: Secondary | ICD-10-CM | POA: Diagnosis not present

## 2024-03-25 DIAGNOSIS — I4819 Other persistent atrial fibrillation: Secondary | ICD-10-CM | POA: Diagnosis not present

## 2024-03-25 DIAGNOSIS — D6859 Other primary thrombophilia: Secondary | ICD-10-CM

## 2024-03-25 DIAGNOSIS — R0609 Other forms of dyspnea: Secondary | ICD-10-CM | POA: Diagnosis not present

## 2024-03-25 DIAGNOSIS — I2510 Atherosclerotic heart disease of native coronary artery without angina pectoris: Secondary | ICD-10-CM | POA: Diagnosis not present

## 2024-03-25 NOTE — Progress Notes (Unsigned)
 " Cardiology Office Note   Date:  03/25/2024  ID:  Chukwuemeka, Artola 1950-07-16, MRN [id number] PCP: Yolande Toribio MATSU, MD  [facility] Providers Cardiologist:  Shelda Bruckner, MD     History of Present Illness Nicholas Daniels is a 73 y.o. 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   Seen by Dr. Bruckner 05/08/2022 in rate controlled atrial fibrillation.  He opted to proceed with rate control for the time being and was given options such as amiodarone or ablation.  He was last seen 05/14/23 with episodic lightheadedness, recommended for monitoring and to ensure eating/drinking well. He was tolerating oral intake with PEG tube removed. Unable to take DOAC due to being on Xtandi. He declined rhythm control of atrial fibrillation, he was in NSR at that clinic visit.  He contacted the office 11/16/23 noting funny feeling in his head like squeezing and LOC after waiting outside in the heat with hypotension, did not go to ED. Subsequent evaluation: Carotid 11/18/23 with no stenosis. Monitor 11/2023 preliminary report with average HR 62 bpm. He had 2 runs of VT longest 7 beats at 105 bpm. Atrial fibrillation burden of 9% with average rate 98 bpm. Atrial fibrillation detected within +/- 45 sec of triggered events. Longest episode of atrial fibrillation 3h 20 min. No significant bradycardia nor pause.    Presents today for follow-up with his wife. Notes last week was not good for him with fatigue, dyspnea on exertion, daily chest/ upper abdomen pressure. This limited his normal activities, such as walking in [facility] [address], requiring frequent breaks. He has noticed increasing similar episodes over last few months. This week he has felt fine. He does not experience palpitations. No recurrent syncope however describes pressure inside that gets bigger and an exploding sensation that has occurred recently in his abdomen.  He does not monitor BP or heart rate at home. He denies weight gain, edema. Denies bleeding on warfarin.   He has a family history of CAD - brother with MI in 65s; mother diagnosed in 23s with CABG in 24s.  He works on Wednesdays at the [employer].  Appetite is fair/good. Eats 2.5 meals daily. Drinks water  and diet, caffeine-free soda. Present symptoms do not occur after eating.   DOE ***  CCTA ***  Occasional dyspnea - that comes and goes with no clear pattern. No wheeze, some cough from recent cold.   Felt bad satruday  ROS: Please see the history of present illness.    All other systems reviewed and are negative.   Studies Reviewed      ***   Risk Assessment/Calculations  CHA2DS2-VASc Score = 3   This indicates a 3.2% annual risk of stroke. The patient's score is based upon: CHF History: 0 HTN History: 1 Diabetes History: 0 Stroke History: 0 Vascular Disease History: 1 Age Score: 1 Gender Score: 0   {This patient has a significant risk of stroke if diagnosed with atrial fibrillation.  Please consider VKA or DOAC agent for anticoagulation if the bleeding risk is acceptable.   You can also use the SmartPhrase .[email or web address] for documentation.   :[id number]}         Physical Exam VS:  BP (!) 104/56 (BP Location: Right Arm, Patient Position: Sitting, Cuff Size: Normal)   Pulse (!) 57   Ht 5' 7 (1.702 m)   Wt 170 lb 4.8 oz (77.2 kg)   SpO2 96%  BMI 26.67 kg/m        Wt Readings from Last 3 Encounters:  03/25/24 170 lb 4.8 oz (77.2 kg)  01/01/24 167 lb (75.8 kg)  08/03/23 169 lb 6.4 oz (76.8 kg)    GEN: Well nourished, well developed in no acute distress NECK: No JVD; No carotid bruits CARDIAC: RRR, no murmurs, rubs, gallops; radial and PT pulses 2+ RESPIRATORY:  Clear to auscultation without rales, wheezing or rhonchi  ABDOMEN: Soft, non-tender, non-distended EXTREMITIES:  No edema; No deformity   ASSESSMENT AND PLAN  DOE / Chest pressure /  Coronary calcification on CT - Recent symptoms of fatigue, DOE, chest pressure. Symptoms with mixed typical and atypical presentation for angina. More notable last week and improved this week. Occur both at rest or with exertion. Mid LAD calcification on CT in 2016. 04/2023 LDL 124. Family history of premature CAD.  Will update echocardiogram in setting of syncope, DOE, chest pressure Ischemia evaluation with coronary CTA ordered; no additional BB indicated for pre-med; update BMET Readdress lipid control at follow up, not discussed today If cardiac testing normal, consider GI referral   Persistent atrial fibrillation / Hypercoagulable state - asymptomatic afib, without bleeding complications; RRR by auscultation today. Monitor 12/15/23 with 9% burden. Previously declined rhythm control. Continue warfarin per anticoag clinic. Denies bleeding complications. Continue metoprolol  tartrate 25mg  BID  Hx of syncope - isolated episode, suspect related to hypotension, heat exhaustion; normal carotid doppler 11/2023; preliminary monitor report reassuring without significant bradycardia, sustained NSVT, or pauses; 03/2021 echo with LVEF 55-60%, mild MR. Has history of episodic lightheadedness with possible presyncope symptoms. Physical exam today is reassuring. Will update echocardiogram in setting of syncope, DOE, chest pressure Advised adequate hydration  Return precautions discussed if recurrent syncope     Dispo: 3 months  Signed, Reche GORMAN Finder, NP   "

## 2024-03-25 NOTE — Patient Instructions (Signed)
 Medication Instructions:  Continue your current medications.   *If you need a refill on your cardiac medications before your next appointment, please call your pharmacy*  Lab Work: Your cholesterol labs earlier this month showed your LDL (bad cholesterol)  was much improved and at goal on Rosuvastatin  (Crestor ).    Testing/Procedures: Your echocardiogram showed your heart muscle and valves were working well!  Your CT scan showed mild plaque build up that was nonobstructive. You take Rosuvastatin  (Crestor ) to help treat this and prevent it from progressing.   Follow-Up: At [facility], you and your health needs are our priority.  As part of our continuing mission to provide you with exceptional heart care, our providers are all part of one team.  This team includes your primary Cardiologist (physician) and Advanced Practice Providers or APPs (Physician Assistants and Nurse Practitioners) who all work together to provide you with the care you need, when you need it.  Your next appointment:   6 month(s)  Provider:   Shelda Bruckner, MD, Rosaline Bane, NP, or Reche Finder, NP    We recommend signing up for the patient portal called MyChart.  Sign up information is provided on this After Visit Summary.  MyChart is used to connect with patients for Virtual Visits (Telemedicine).  Patients are able to view lab/test results, encounter notes, upcoming appointments, etc.  Non-urgent messages can be sent to your provider as well.   To learn more about what you can do with MyChart, go to [email or web address].   Other Instructions  To prevent lightheadedness: Stay well hydrated Eat regular meals Wear knee high compression socks during the day Make position changes slowly  We have referred you to Dr. Geronimo for further evaluation of your shortness of breath.

## 2024-03-26 ENCOUNTER — Encounter (HOSPITAL_BASED_OUTPATIENT_CLINIC_OR_DEPARTMENT_OTHER): Payer: Self-pay | Admitting: Family

## 2024-04-15 ENCOUNTER — Encounter: Payer: Self-pay | Admitting: Hematology and Oncology

## 2024-04-22 ENCOUNTER — Ambulatory Visit

## 2024-04-22 DIAGNOSIS — Z5181 Encounter for therapeutic drug level monitoring: Secondary | ICD-10-CM | POA: Diagnosis not present

## 2024-04-22 DIAGNOSIS — I4891 Unspecified atrial fibrillation: Secondary | ICD-10-CM

## 2024-04-22 LAB — POCT INR: INR: 3.3 — AB (ref 2.0–3.0)

## 2024-04-22 NOTE — Progress Notes (Signed)
 Description   INR-3.3; Do not take any warfarin today then continue taking 1 tablet daily (5mg ), except 1/2 tablet every Wednesday (2.5mg ). Stay consistent with greens each week.  Recheck INR in 5 weeks.  Anticoagulation Clinic [id number]

## 2024-04-22 NOTE — Patient Instructions (Signed)
 Description   INR-3.3; Do not take any warfarin today then continue taking 1 tablet daily (5mg ), except 1/2 tablet every Wednesday (2.5mg ). Stay consistent with greens each week.  Recheck INR in 5 weeks.  Anticoagulation Clinic [id number]

## 2024-05-27 ENCOUNTER — Ambulatory Visit

## 2024-08-02 ENCOUNTER — Ambulatory Visit: Admitting: Hematology and Oncology
